# Patient Record
Sex: Male | Born: 1970 | Race: White | Hispanic: No | Marital: Married | Smoking: Never smoker
Health system: Southern US, Community
[De-identification: ages and names within clinical notes are randomized; demographics above are authoritative.]

## PROBLEM LIST (undated history)

## (undated) DIAGNOSIS — E119 Type 2 diabetes mellitus without complications: Secondary | ICD-10-CM

## (undated) DIAGNOSIS — E538 Deficiency of other specified B group vitamins: Secondary | ICD-10-CM

## (undated) DIAGNOSIS — G4733 Obstructive sleep apnea (adult) (pediatric): Secondary | ICD-10-CM

## (undated) DIAGNOSIS — R600 Localized edema: Secondary | ICD-10-CM

## (undated) DIAGNOSIS — J45909 Unspecified asthma, uncomplicated: Secondary | ICD-10-CM

## (undated) DIAGNOSIS — E232 Diabetes insipidus: Secondary | ICD-10-CM

## (undated) DIAGNOSIS — R29898 Other symptoms and signs involving the musculoskeletal system: Secondary | ICD-10-CM

## (undated) DIAGNOSIS — G629 Polyneuropathy, unspecified: Secondary | ICD-10-CM

## (undated) DIAGNOSIS — T8859XA Other complications of anesthesia, initial encounter: Secondary | ICD-10-CM

## (undated) DIAGNOSIS — M549 Dorsalgia, unspecified: Secondary | ICD-10-CM

## (undated) DIAGNOSIS — E669 Obesity, unspecified: Secondary | ICD-10-CM

## (undated) DIAGNOSIS — N469 Male infertility, unspecified: Secondary | ICD-10-CM

## (undated) DIAGNOSIS — I82409 Acute embolism and thrombosis of unspecified deep veins of unspecified lower extremity: Secondary | ICD-10-CM

## (undated) HISTORY — PX: CERVICAL DISC SURGERY: SHX588

## (undated) HISTORY — PX: ULNAR NERVE REPAIR: SHX2594

## (undated) HISTORY — DX: Obesity, unspecified: E66.9

## (undated) HISTORY — DX: Localized edema: R60.0

## (undated) HISTORY — DX: Obstructive sleep apnea (adult) (pediatric): G47.33

## (undated) HISTORY — PX: FIBULA FRACTURE SURGERY: SHX947

## (undated) HISTORY — PX: OTHER SURGICAL HISTORY: SHX169

## (undated) HISTORY — PX: ELBOW SURGERY: SHX618

## (undated) HISTORY — DX: Acute embolism and thrombosis of unspecified deep veins of unspecified lower extremity: I82.409

## (undated) HISTORY — DX: Other symptoms and signs involving the musculoskeletal system: R29.898

## (undated) HISTORY — PX: BACK SURGERY: SHX140

## (undated) HISTORY — DX: Male infertility, unspecified: N46.9

## (undated) HISTORY — DX: Deficiency of other specified B group vitamins: E53.8

## (undated) HISTORY — DX: Diabetes insipidus: E23.2

## (undated) HISTORY — DX: Type 2 diabetes mellitus without complications: E11.9

## (undated) HISTORY — DX: Dorsalgia, unspecified: M54.9

---

## 2003-07-21 ENCOUNTER — Emergency Department (HOSPITAL_COMMUNITY): Admission: EM | Admit: 2003-07-21 | Discharge: 2003-07-22 | Payer: Self-pay | Admitting: Emergency Medicine

## 2003-07-30 ENCOUNTER — Ambulatory Visit (HOSPITAL_COMMUNITY): Admission: RE | Admit: 2003-07-30 | Discharge: 2003-07-30 | Payer: Self-pay | Admitting: Family Medicine

## 2004-03-31 ENCOUNTER — Encounter: Payer: Self-pay | Admitting: Pulmonary Disease

## 2004-04-01 ENCOUNTER — Encounter: Payer: Self-pay | Admitting: Pulmonary Disease

## 2004-04-08 ENCOUNTER — Encounter: Payer: Self-pay | Admitting: Pulmonary Disease

## 2004-07-24 ENCOUNTER — Ambulatory Visit: Payer: Self-pay | Admitting: Family Medicine

## 2004-10-18 ENCOUNTER — Ambulatory Visit: Payer: Self-pay | Admitting: Family Medicine

## 2006-11-27 ENCOUNTER — Ambulatory Visit: Payer: Self-pay | Admitting: Family Medicine

## 2007-01-13 DIAGNOSIS — M5020 Other cervical disc displacement, unspecified cervical region: Secondary | ICD-10-CM | POA: Insufficient documentation

## 2007-01-15 ENCOUNTER — Ambulatory Visit: Payer: Self-pay | Admitting: Family Medicine

## 2007-01-15 DIAGNOSIS — Z86718 Personal history of other venous thrombosis and embolism: Secondary | ICD-10-CM | POA: Insufficient documentation

## 2007-01-17 ENCOUNTER — Telehealth: Payer: Self-pay | Admitting: Family Medicine

## 2007-01-17 ENCOUNTER — Ambulatory Visit: Payer: Self-pay | Admitting: Family Medicine

## 2007-01-17 ENCOUNTER — Encounter: Admission: RE | Admit: 2007-01-17 | Discharge: 2007-01-17 | Payer: Self-pay | Admitting: Family Medicine

## 2007-01-21 ENCOUNTER — Encounter: Admission: RE | Admit: 2007-01-21 | Discharge: 2007-01-21 | Payer: Self-pay | Admitting: Family Medicine

## 2007-01-22 ENCOUNTER — Telehealth (INDEPENDENT_AMBULATORY_CARE_PROVIDER_SITE_OTHER): Payer: Self-pay | Admitting: *Deleted

## 2007-01-29 ENCOUNTER — Ambulatory Visit (HOSPITAL_COMMUNITY): Admission: RE | Admit: 2007-01-29 | Discharge: 2007-01-30 | Payer: Self-pay | Admitting: Neurosurgery

## 2007-02-11 ENCOUNTER — Ambulatory Visit: Payer: Self-pay | Admitting: Family Medicine

## 2007-02-11 LAB — CONVERTED CEMR LAB
ALT: 30 units/L (ref 0–53)
Albumin: 3.5 g/dL (ref 3.5–5.2)
Alkaline Phosphatase: 44 units/L (ref 39–117)
Bilirubin Urine: NEGATIVE
Bilirubin, Direct: 0.1 mg/dL (ref 0.0–0.3)
Calcium: 9.7 mg/dL (ref 8.4–10.5)
Chloride: 103 meq/L (ref 96–112)
Direct LDL: 93.7 mg/dL
Eosinophils Absolute: 0 10*3/uL (ref 0.0–0.6)
Eosinophils Relative: 0.2 % (ref 0.0–5.0)
Glucose, Urine, Semiquant: NEGATIVE
HDL: 24.7 mg/dL — ABNORMAL LOW (ref >39.0)
Ketones, urine, test strip: NEGATIVE
Lymphocytes Relative: 49.6 % — ABNORMAL HIGH (ref 12.0–46.0)
MCHC: 35.8 g/dL (ref 30.0–36.0)
Monocytes Relative: 7.9 % (ref 3.0–11.0)
Neutrophils Relative %: 41.7 % — ABNORMAL LOW (ref 43.0–77.0)
RBC: 4.28 M/uL (ref 4.22–5.81)
Sodium: 141 meq/L (ref 135–145)
TSH: 0.89 microintl units/mL (ref 0.35–5.50)
Total Bilirubin: 0.5 mg/dL (ref 0.3–1.2)
Total Protein: 6.3 g/dL (ref 6.0–8.3)
Triglycerides: 246 mg/dL (ref 0–149)
Urobilinogen, UA: 0.2
WBC: 7.9 10*3/uL (ref 4.5–10.5)

## 2007-02-13 ENCOUNTER — Ambulatory Visit: Payer: Self-pay | Admitting: Family Medicine

## 2007-02-13 DIAGNOSIS — Z6838 Body mass index (BMI) 38.0-38.9, adult: Secondary | ICD-10-CM | POA: Insufficient documentation

## 2007-02-13 DIAGNOSIS — G4733 Obstructive sleep apnea (adult) (pediatric): Secondary | ICD-10-CM | POA: Insufficient documentation

## 2007-05-19 ENCOUNTER — Ambulatory Visit: Payer: Self-pay | Admitting: Internal Medicine

## 2007-05-19 LAB — CONVERTED CEMR LAB
Glucose, Bld: 138 mg/dL — ABNORMAL HIGH (ref 70–99)
Hgb A1c MFr Bld: 7.3 % — ABNORMAL HIGH (ref 4.6–6.0)

## 2007-05-26 ENCOUNTER — Telehealth: Payer: Self-pay | Admitting: Internal Medicine

## 2007-05-26 ENCOUNTER — Ambulatory Visit: Payer: Self-pay | Admitting: Internal Medicine

## 2007-05-26 ENCOUNTER — Ambulatory Visit: Payer: Self-pay

## 2007-05-26 DIAGNOSIS — R21 Rash and other nonspecific skin eruption: Secondary | ICD-10-CM

## 2007-05-26 DIAGNOSIS — R609 Edema, unspecified: Secondary | ICD-10-CM | POA: Insufficient documentation

## 2007-06-23 ENCOUNTER — Ambulatory Visit: Payer: Self-pay | Admitting: Internal Medicine

## 2007-06-23 DIAGNOSIS — M722 Plantar fascial fibromatosis: Secondary | ICD-10-CM

## 2007-08-18 ENCOUNTER — Encounter: Payer: Self-pay | Admitting: Internal Medicine

## 2007-08-18 ENCOUNTER — Encounter: Admission: RE | Admit: 2007-08-18 | Discharge: 2007-08-26 | Payer: Self-pay | Admitting: Internal Medicine

## 2007-08-21 ENCOUNTER — Telehealth (INDEPENDENT_AMBULATORY_CARE_PROVIDER_SITE_OTHER): Payer: Self-pay | Admitting: *Deleted

## 2007-09-19 ENCOUNTER — Ambulatory Visit: Payer: Self-pay | Admitting: Internal Medicine

## 2007-09-19 DIAGNOSIS — J45901 Unspecified asthma with (acute) exacerbation: Secondary | ICD-10-CM

## 2007-09-19 DIAGNOSIS — J45909 Unspecified asthma, uncomplicated: Secondary | ICD-10-CM | POA: Insufficient documentation

## 2007-09-19 DIAGNOSIS — J4520 Mild intermittent asthma, uncomplicated: Secondary | ICD-10-CM | POA: Insufficient documentation

## 2007-09-26 ENCOUNTER — Ambulatory Visit: Payer: Self-pay | Admitting: Internal Medicine

## 2007-09-26 LAB — CONVERTED CEMR LAB: Hgb A1c MFr Bld: 6.3 % — ABNORMAL HIGH (ref 4.6–6.0)

## 2007-10-03 ENCOUNTER — Ambulatory Visit: Payer: Self-pay | Admitting: Internal Medicine

## 2007-10-03 DIAGNOSIS — I8390 Asymptomatic varicose veins of unspecified lower extremity: Secondary | ICD-10-CM

## 2007-10-03 LAB — CONVERTED CEMR LAB: Blood Glucose, Fingerstick: 120

## 2007-10-23 ENCOUNTER — Ambulatory Visit: Payer: Self-pay | Admitting: Pulmonary Disease

## 2007-11-28 ENCOUNTER — Encounter: Payer: Self-pay | Admitting: Pulmonary Disease

## 2007-12-09 ENCOUNTER — Ambulatory Visit: Payer: Self-pay | Admitting: Vascular Surgery

## 2007-12-13 DIAGNOSIS — E139 Other specified diabetes mellitus without complications: Secondary | ICD-10-CM | POA: Insufficient documentation

## 2007-12-13 HISTORY — DX: Other specified diabetes mellitus without complications: E13.9

## 2007-12-22 ENCOUNTER — Ambulatory Visit: Payer: Self-pay | Admitting: Pulmonary Disease

## 2007-12-22 DIAGNOSIS — G4733 Obstructive sleep apnea (adult) (pediatric): Secondary | ICD-10-CM

## 2008-01-11 LAB — HM DIABETES EYE EXAM: HM Diabetic Eye Exam: NORMAL

## 2008-01-16 ENCOUNTER — Ambulatory Visit: Payer: Self-pay | Admitting: Internal Medicine

## 2008-01-16 LAB — CONVERTED CEMR LAB
Albumin: 3.7 g/dL (ref 3.5–5.2)
Alkaline Phosphatase: 44 units/L (ref 39–117)
Cholesterol: 160 mg/dL (ref 0–200)
Hgb A1c MFr Bld: 6.7 % — ABNORMAL HIGH (ref 4.6–6.0)
Total Protein: 6.8 g/dL (ref 6.0–8.3)
Triglycerides: 117 mg/dL (ref 0–149)
VLDL: 23 mg/dL (ref 0–40)

## 2008-01-23 ENCOUNTER — Ambulatory Visit: Payer: Self-pay | Admitting: Internal Medicine

## 2008-01-23 DIAGNOSIS — E785 Hyperlipidemia, unspecified: Secondary | ICD-10-CM | POA: Insufficient documentation

## 2008-02-02 ENCOUNTER — Telehealth: Payer: Self-pay | Admitting: *Deleted

## 2008-04-07 ENCOUNTER — Ambulatory Visit: Payer: Self-pay | Admitting: Internal Medicine

## 2008-04-07 LAB — CONVERTED CEMR LAB
AST: 17 units/L (ref 0–37)
Albumin: 3.7 g/dL (ref 3.5–5.2)
Cholesterol: 166 mg/dL (ref 0–200)
HDL: 23.3 mg/dL — ABNORMAL LOW (ref >39.0)
LDL Cholesterol: 106 mg/dL — ABNORMAL HIGH (ref 0–99)
Triglycerides: 184 mg/dL — ABNORMAL HIGH (ref 0–149)

## 2008-04-14 ENCOUNTER — Ambulatory Visit: Payer: Self-pay | Admitting: Internal Medicine

## 2008-04-14 LAB — CONVERTED CEMR LAB: Blood Glucose, Fingerstick: 154

## 2008-04-19 ENCOUNTER — Emergency Department (HOSPITAL_COMMUNITY): Admission: EM | Admit: 2008-04-19 | Discharge: 2008-04-19 | Payer: Self-pay | Admitting: Emergency Medicine

## 2008-04-30 ENCOUNTER — Encounter: Admission: RE | Admit: 2008-04-30 | Discharge: 2008-04-30 | Payer: Self-pay | Admitting: Neurosurgery

## 2008-06-21 ENCOUNTER — Ambulatory Visit: Payer: Self-pay | Admitting: Pulmonary Disease

## 2008-07-16 ENCOUNTER — Ambulatory Visit: Payer: Self-pay | Admitting: Internal Medicine

## 2008-07-16 LAB — CONVERTED CEMR LAB
ALT: 32 units/L (ref 0–53)
Albumin: 3.7 g/dL (ref 3.5–5.2)
Cholesterol: 171 mg/dL (ref 0–200)
Creatinine,U: 141.2 mg/dL
Hgb A1c MFr Bld: 7.3 % — ABNORMAL HIGH (ref 4.6–6.5)
Microalb Creat Ratio: 9.9 mg/g (ref 0.0–30.0)
Total CHOL/HDL Ratio: 7
VLDL: 50 mg/dL — ABNORMAL HIGH (ref 0.0–40.0)

## 2008-07-23 ENCOUNTER — Ambulatory Visit: Payer: Self-pay | Admitting: Internal Medicine

## 2008-07-23 LAB — CONVERTED CEMR LAB: Blood Glucose, Fingerstick: 128

## 2008-10-08 ENCOUNTER — Ambulatory Visit: Payer: Self-pay | Admitting: Internal Medicine

## 2008-10-08 LAB — CONVERTED CEMR LAB
ALT: 25 units/L (ref 0–53)
AST: 18 units/L (ref 0–37)
Alkaline Phosphatase: 49 units/L (ref 39–117)
Bilirubin, Direct: 0.1 mg/dL (ref 0.0–0.3)
Cholesterol: 156 mg/dL (ref 0–200)
HDL: 30.4 mg/dL — ABNORMAL LOW (ref >39.00)
Hgb A1c MFr Bld: 8 % — ABNORMAL HIGH (ref 4.6–6.5)
Total Protein: 7.3 g/dL (ref 6.0–8.3)

## 2008-10-15 ENCOUNTER — Ambulatory Visit: Payer: Self-pay | Admitting: Internal Medicine

## 2008-10-15 LAB — CONVERTED CEMR LAB: Blood Glucose, Fingerstick: 350

## 2008-11-25 ENCOUNTER — Telehealth: Payer: Self-pay | Admitting: *Deleted

## 2008-12-09 ENCOUNTER — Encounter: Payer: Self-pay | Admitting: Pulmonary Disease

## 2009-01-06 ENCOUNTER — Ambulatory Visit: Payer: Self-pay | Admitting: Internal Medicine

## 2009-01-14 ENCOUNTER — Ambulatory Visit: Payer: Self-pay | Admitting: Internal Medicine

## 2009-01-15 ENCOUNTER — Encounter: Payer: Self-pay | Admitting: Pulmonary Disease

## 2009-01-21 ENCOUNTER — Ambulatory Visit: Payer: Self-pay | Admitting: Internal Medicine

## 2009-01-21 DIAGNOSIS — R3915 Urgency of urination: Secondary | ICD-10-CM | POA: Insufficient documentation

## 2009-01-21 LAB — CONVERTED CEMR LAB
Bilirubin Urine: NEGATIVE
Nitrite: NEGATIVE
Protein, U semiquant: NEGATIVE
WBC Urine, dipstick: NEGATIVE

## 2009-04-29 ENCOUNTER — Ambulatory Visit: Payer: Self-pay | Admitting: Internal Medicine

## 2009-04-29 LAB — CONVERTED CEMR LAB
ALT: 29 units/L (ref 0–53)
AST: 21 units/L (ref 0–37)
Cholesterol: 128 mg/dL (ref 0–200)
Hgb A1c MFr Bld: 9 % — ABNORMAL HIGH (ref 4.6–6.5)
Total Bilirubin: 0.5 mg/dL (ref 0.3–1.2)
Total CHOL/HDL Ratio: 3
Total Protein: 7 g/dL (ref 6.0–8.3)

## 2009-05-06 ENCOUNTER — Telehealth: Payer: Self-pay | Admitting: *Deleted

## 2009-05-06 ENCOUNTER — Ambulatory Visit: Payer: Self-pay | Admitting: Internal Medicine

## 2009-05-06 LAB — CONVERTED CEMR LAB
Blood Glucose, Fingerstick: 258
Glucose, Urine, Semiquant: 100
Urobilinogen, UA: 0.2
pH: 5

## 2009-05-09 ENCOUNTER — Encounter (INDEPENDENT_AMBULATORY_CARE_PROVIDER_SITE_OTHER): Payer: Self-pay | Admitting: *Deleted

## 2009-05-09 ENCOUNTER — Ambulatory Visit: Payer: Self-pay | Admitting: Internal Medicine

## 2009-05-18 LAB — CONVERTED CEMR LAB
Leukocytes, UA: NEGATIVE
Nitrite: NEGATIVE
Urine Glucose: NEGATIVE mg/dL
Urobilinogen, UA: 0.2 (ref 0.0–1.0)

## 2009-05-20 ENCOUNTER — Encounter: Payer: Self-pay | Admitting: Internal Medicine

## 2009-05-25 ENCOUNTER — Telehealth: Payer: Self-pay | Admitting: *Deleted

## 2009-05-31 ENCOUNTER — Ambulatory Visit: Payer: Self-pay | Admitting: Gastroenterology

## 2009-05-31 LAB — CONVERTED CEMR LAB: TSH: 1.5 microintl units/mL (ref 0.35–5.50)

## 2009-06-03 ENCOUNTER — Encounter: Payer: Self-pay | Admitting: Gastroenterology

## 2009-06-03 ENCOUNTER — Ambulatory Visit: Payer: Self-pay | Admitting: Internal Medicine

## 2009-06-03 LAB — CONVERTED CEMR LAB: Tissue Transglutaminase Ab, IgA: 0.5 units (ref <7)

## 2009-06-04 ENCOUNTER — Encounter: Payer: Self-pay | Admitting: Gastroenterology

## 2009-06-07 ENCOUNTER — Encounter: Payer: Self-pay | Admitting: Internal Medicine

## 2009-06-08 ENCOUNTER — Encounter: Payer: Self-pay | Admitting: Internal Medicine

## 2009-07-12 ENCOUNTER — Ambulatory Visit: Payer: Self-pay | Admitting: Gastroenterology

## 2009-07-14 ENCOUNTER — Encounter: Payer: Self-pay | Admitting: Gastroenterology

## 2009-07-29 ENCOUNTER — Ambulatory Visit: Payer: Self-pay | Admitting: Internal Medicine

## 2009-07-29 LAB — CONVERTED CEMR LAB
ALT: 24 units/L (ref 0–53)
Alkaline Phosphatase: 47 units/L (ref 39–117)
Basophils Absolute: 0 10*3/uL (ref 0.0–0.1)
Basophils Relative: 0.4 % (ref 0.0–3.0)
Bilirubin, Direct: 0.1 mg/dL (ref 0.0–0.3)
Cholesterol: 167 mg/dL (ref 0–200)
Creatinine, Ser: 0.7 mg/dL (ref 0.4–1.5)
Eosinophils Relative: 0 % (ref 0.0–5.0)
GFR calc non Af Amer: 145.37 mL/min (ref >60)
Glucose, Bld: 138 mg/dL — ABNORMAL HIGH (ref 70–99)
HDL: 30 mg/dL — ABNORMAL LOW (ref >39.00)
Monocytes Relative: 6 % (ref 3.0–12.0)
Neutro Abs: 3.7 10*3/uL (ref 1.4–7.7)
Nitrite: NEGATIVE
Protein, U semiquant: NEGATIVE
RBC: 4.43 M/uL (ref 4.22–5.81)
RDW: 13.5 % (ref 11.5–14.6)
Specific Gravity, Urine: >=1.03
TSH: 0.74 microintl units/mL (ref 0.35–5.50)
Total Bilirubin: 0.8 mg/dL (ref 0.3–1.2)
Total CHOL/HDL Ratio: 6
Triglycerides: 151 mg/dL — ABNORMAL HIGH (ref 0.0–149.0)
WBC: 7.7 10*3/uL (ref 4.5–10.5)

## 2009-08-05 ENCOUNTER — Ambulatory Visit: Payer: Self-pay | Admitting: Internal Medicine

## 2009-08-05 DIAGNOSIS — D126 Benign neoplasm of colon, unspecified: Secondary | ICD-10-CM | POA: Insufficient documentation

## 2009-08-15 ENCOUNTER — Encounter: Payer: Self-pay | Admitting: Pulmonary Disease

## 2009-11-25 ENCOUNTER — Ambulatory Visit: Payer: Self-pay | Admitting: Internal Medicine

## 2009-11-25 LAB — CONVERTED CEMR LAB
Direct LDL: 97.1 mg/dL
Hgb A1c MFr Bld: 9.1 % — ABNORMAL HIGH (ref 4.6–6.5)
VLDL: 66.2 mg/dL — ABNORMAL HIGH (ref 0.0–40.0)

## 2009-12-09 ENCOUNTER — Ambulatory Visit: Payer: Self-pay | Admitting: Internal Medicine

## 2009-12-09 DIAGNOSIS — Z9119 Patient's noncompliance with other medical treatment and regimen: Secondary | ICD-10-CM | POA: Insufficient documentation

## 2009-12-09 LAB — CONVERTED CEMR LAB: Blood Glucose, Fingerstick: 204

## 2010-04-09 LAB — CONVERTED CEMR LAB
Blood Glucose, Fingerstick: 169
Blood Glucose, Fingerstick: 289

## 2010-04-11 NOTE — Letter (Signed)
Summary: Patient Notice- Polyp Results  [facility]  [address] [place], [place] [address]   Phone: [phone]  Fax: [phone]        Jul 14, 2009 MRN: [id number]    Troy [name] [address] [place], [place]  [address]    Dear [name],  I am pleased to inform you that the colon polyp(s) removed during your recent colonoscopy was (were) found to be benign (no cancer detected) upon pathologic examination.  I recommend you have a repeat colonoscopy examination in 5 years to look for recurrent polyps, as having colon polyps increases your risk for having recurrent polyps or even colon cancer in the future.  Should you develop new or worsening symptoms of abdominal pain, bowel habit changes or bleeding from the rectum or bowels, please schedule an evaluation with either your primary care physician or with me.  Continue treatment plan as outlined the day of your exam.  Please call us if you are having persistent problems or have questions about your condition that have not been fully answered at this time.  Sincerely,  Meryl Dare MD [facility]  This letter has been electronically signed by your physician.  Appended Document: Patient Notice- Polyp Results letter mailed 4.9.11.

## 2010-04-11 NOTE — Assessment & Plan Note (Signed)
Summary: cpx//ccm   Vital Signs:  Patient profile:   40 year old male Height:      70.5 inches Weight:      336 pounds BMI:     47.70 Temp:     97.9 degrees F oral Pulse rate:   65 / minute BP sitting:   120 / 80  (right arm) Cuff size:   large  Vitals Entered By: Kathrynn Speed CMA (Aug 05, 2009 11:29 AM) CC: CPX Vickie Epley CBG Result 169   History of Present Illness: Troy [name] comes in today   for preventive visit and follow up of multiple medical problems  Since last visit  here  there have been no major changes in health status  .     Just moved    residence to a house . RESP:    no change   any signs related to obesity DM doing some better  no low.s.  CV :   no cp sob change OSA helps to sleep 5 plus hours   cpap GI : now on pancrease enzume  doing better had colonosoccopy  polyp. on 5 year recall  BP   has been  ok  Lipids: nose of meds  Vision no change   no recent dental appt  Legs  No increase swelling or ulcers     Preventive Screening-Counseling & Management  Alcohol-Tobacco     Alcohol drinks/day: 0     Smoking Status: never  Caffeine-Diet-Exercise     Caffeine use/day: 0     Does Patient Exercise: yes  Hep-HIV-STD-Contraception     Dental Visit-last 6 months no     Sun Exposure-Excessive: no  Safety-Violence-Falls     Seat Belt Use: yes     Firearms in the Home: no firearms in the home     Smoke Detectors: yes  Current Medications (verified): 1)  Aspirin 325 Mg Tabs (Aspirin) .... Take One By Mouth At Bedtime 2)  Proair Hfa 108 (90 Base) Mcg/act  Aers (Albuterol Sulfate) .[name].. 1-2 Puffs Qid As Needed Wheezing 3)  Symbicort 160-4.5 Mcg/act  Aero (Budesonide-Formoterol Fumarate) .... 2 Puffs Two Times A Day As Needed 4)  Metformin Hcl 500 Mg Tabs (Metformin Hcl) .[name].. 1 By Mouth Two Times A Day 5)  Hydrocodone-Acetaminophen 5-325 Mg Tabs (Hydrocodone-Acetaminophen) .... Take By Mouth As Needed 6)  Simvastatin 40 Mg Tabs (Simvastatin) .[name].. 1 By  Mouth Once Daily 7)  Onglyza 5 Mg Tabs (Saxagliptin Hcl) .[name].. 1 By Mouth Once Daily 8)  Humalog Kwikpen 100 Unit/ml Soln (Insulin Lispro (Human)) .... 8-10 Units Before Larges Meal Once A Day  or As Directed 9)  Bd Pen Needle Ultrafine 29g X 12.65mm Misc (Insulin Pen Needle) .... Use As Directed 10)  Actos 30 Mg Tabs (Pioglitazone Hcl) .[name].. 1 By Mouth Once Daily 11)  Freestyle Lite Test  Strp (Glucose Blood) .... Use Two Times A Day 12)  Creon 24000 Unit Cpep (Pancrelipase (Lip-Prot-Amyl)) .... One Tablet By Mouth Before Meals  Allergies (verified): 1)  ! * Picc Line  Past History:  Past medical, surgical, family and social histories (including risk factors) reviewed, and no changes noted (except as noted below).  Past Medical History: DVT, hx of OSA  Diabetes Obesity Residual weakness right hand arm  Consults Dr. Oliver Hum Dr. Hart [place]  Past Surgical History: Reviewed history from 05/31/2009 and no changes required. hx R fibula fx and required surgery and a  plate and pin, removed due to infection 1997 laceration right  ulnar nerve repaired by Dr. Ozella Almond Left elbow surgery  Cubital tunnel left arm 2003 C6/C7 disc surgery 2009  Past History:  Care Management: Neurology: Lovell Sheehan Pulmonary: Clance GI: Urology: Allaince Urology Dr? Gastroenterology: Russella Dar  Family History: Reviewed history from 05/31/2009 and no changes required. Family History Hypertension father had cervical and lumbar disk disease mother has hypertension DVTs, sleep apnea, and obesity.   No brothers no sisters  allergies: father heart disease: mother clotting disorders: mother  father Sudden death  07-Mar-2010No FH of Colon Cancer:  Social History: Reviewed history from 06/03/2009 and no changes required. 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  Momwas sick recently helping out Just moved to 4 r house up and down stairs  father died.  suddenly .   05/21/08 Daily Caffeine Use  one a day Dental Care w/in 6 mos.:  no Sun Exposure-Excessive:  no Seat Belt Use:  yes  Review of Systems  The patient denies anorexia, fever, weight gain, vision loss, decreased hearing, hoarseness, chest pain, syncope, prolonged cough, abdominal pain, melena, hematochezia, severe indigestion/heartburn, hematuria, transient blindness, difficulty walking, depression, unusual weight change, abnormal bleeding, enlarged lymph nodes, and angioedema.         some lbp after lifting boxes and moving    no radiation on pancrease enzyme  med    Diabetes Management Exam:    Foot Exam (with socks and/or shoes not present):       Sensory-Monofilament:          Left foot: normal          Right foot: normal       Sensory-other: some callus no ulcer   or cracking        Inspection:          Left foot: normal          Right foot: normal       Nails:          Left foot: normal          Right foot: normal Physical Exam General Appearance: well developed, well nourished, no acute distress Eyes: conjunctiva and lids normal, PERRLA, EOMI,  WNL Ears, Nose, Mouth, Throat: TM clear, nares clear, oral exam WNL Neck: supple, no lymphadenopathy, no thyromegaly, no JVD Respiratory: clear to auscultation and percussion, respiratory effort normal Cardiovascular: regular rate and rhythm, S1-S2, no murmur, rub or gallop, no bruits, peripheral pulses normal and symmetric, no cyanosis, clubbing,  trc  edema local induration redness medial right leg  wihtout ulceration Chest: no scars, masses, tenderness; no asymmetry, skin changes, nipple discharge, no gynecomastia   Gastrointestinal: soft, non-tender; no hepatosplenomegaly, masses; active bowel sounds all quadrants, see colonscopy report  pre cancerous polyps Lymphatic: no cervical, axillary or inguinal  adenopathy Musculoskeletal: gait normal, muscle tone and strength WNL, no joint swelling, effusions, discoloration, crepitus  Skin: clear, good turgor, color WNL, no rashes, lesions, or ulcerations Neurologic: normal mental status, normal reflexes, normal strength, sensation, and motion Psychiatric: alert; oriented to person, place and time Other Exam:   EKG NSR  no acute changes     Impression & Recommendations:  Problem # 1:  PREVENTIVE HEALTH CARE (ICD-V70.0)  Discussed nutrition,exercise,diet,healthy weight, vitamin D and calcium.    Reviewed preventive care protocols, scheduled due services, and updated  immunizations.  Problem # 2:  DIABETES MELLITUS (ICD-250.00) Assessment: Improved some improved still needs improvemnt  His updated medication list for this problem includes:    Aspirin 325 Mg Tabs (Aspirin) .[name]... Take one by mouth at bedtime    Metformin Hcl 500 Mg Tabs (Metformin hcl) .[name]... 1 by mouth two times a day    Onglyza 5 Mg Tabs (Saxagliptin hcl) .[name]... 1 by mouth once daily    Humalog Kwikpen 100 Unit/ml Soln (Insulin lispro (human)) .[name]... 8-10 units before larges meal once a day  or as directed    Actos 30 Mg Tabs (Pioglitazone hcl) .[name]... 1 by mouth once daily  Labs Reviewed: Creat: 0.7 (07/29/2009)     Last Eye Exam: normal (01/11/2008) Reviewed HgBA1c results: 7.2 (07/29/2009)  9.0 (04/29/2009)  Problem # 3:  HYPERLIPIDEMIA (ICD-272.4) coulod be better    wants to go with lifestyle intervention  His updated medication list for this problem includes:    Simvastatin 40 Mg Tabs (Simvastatin) .[name]... 1 by mouth once daily  Labs Reviewed: SGOT: 18 (07/29/2009)   SGPT: 24 (07/29/2009)   HDL:30.00 (07/29/2009), 37.70 (04/29/2009)  LDL:107 (07/29/2009), 53 (16/12/9602)  Chol:167 (07/29/2009), 128 (04/29/2009)  Trig:151.0 (07/29/2009), 189.0 (04/29/2009)  Problem # 4:  OBSTRUCTIVE SLEEP APNEA (ICD-327.23) Assessment: Unchanged on cpap  Problem # 5:  VARICOSE  VEINS, LOWER EXTREMITIES (ICD-454.9) Assessment: Unchanged stocking on right helps   Problem # 6:  MORBID OBESITY (ICD-278.01) some weight loss  since last visit ...... need  s to continue   Problem # 7:  ASTHMA (ICD-493.90) Assessment: Unchanged  His updated medication list for this problem includes:    Proair Hfa 108 (90 Base) Mcg/act Aers (Albuterol sulfate) .[name]... 1-2 puffs qid as needed wheezing    Symbicort 160-4.5 Mcg/act Aero (Budesonide-formoterol fumarate) .[name]... 2 puffs two times a day as needed  Pulmonary Functions Reviewed: O2 sat: 98 (01/21/2009)  Complete Medication List: 1)  Aspirin 325 Mg Tabs (Aspirin) .... Take one by mouth at bedtime 2)  Proair Hfa 108 (90 Base) Mcg/act Aers (Albuterol sulfate) .[name].. 1-2 puffs qid as needed wheezing 3)  Symbicort 160-4.5 Mcg/act Aero (Budesonide-formoterol fumarate) .... 2 puffs two times a day as needed 4)  Metformin Hcl 500 Mg Tabs (Metformin hcl) .[name].. 1 by mouth two times a day 5)  Hydrocodone-acetaminophen 5-325 Mg Tabs (Hydrocodone-acetaminophen) .... Take by mouth as needed 6)  Simvastatin 40 Mg Tabs (Simvastatin) .[name].. 1 by mouth once daily 7)  Onglyza 5 Mg Tabs (Saxagliptin hcl) .[name].. 1 by mouth once daily 8)  Humalog Kwikpen 100 Unit/ml Soln (Insulin lispro (human)) .... 8-10 units before larges meal once a day  or as directed 9)  Bd Pen Needle Ultrafine 29g X 12.22mm Misc (Insulin pen needle) .... Use as directed 10)  Actos 30 Mg Tabs (Pioglitazone hcl) .[name].. 1 by mouth once daily 11)  Freestyle Lite Test Strp (Glucose blood) .... Use two times a day 12)  Creon 24000 Unit Cpep (Pancrelipase (lip-prot-amyl)) .... One tablet by mouth before meals  Other Orders: EKG w/ Interpretation (93000)  Patient Instructions: 1)  intermittently   stand  when working .   2)  get eye exam and dental check. 3)  Continue  weight loss  to help your medical problems and medical  expenses.    4)  consider welchol  in future   for lipids and  blood sugar.  5)  HgBA1c prior to visit  ICD-9:  6)  Lipid panel prior to visit ICD-9 :  7)  Please schedule a follow-up appointment in3- 4 months .   Preventive Care Screening  Prior Values:    Last Tetanus Booster:  Td (02/13/2007)    Last Flu Shot:  Historical (12/27/2008)   Prevention & Chronic Care Immunizations   Influenza vaccine: Historical  (12/27/2008)   Influenza vaccine due: 02/13/2008    Tetanus booster: 02/13/2007: Td   Tetanus booster due: 02/12/2017    Pneumococcal vaccine: Not documented  Other Screening   Smoking status: never  (08/05/2009)  Diabetes Mellitus   HgbA1C: 7.2  (07/29/2009)   Hemoglobin A1C due: 07/27/2009    Eye exam: normal  (01/11/2008)   Eye exam due: 01/2009    Foot exam: yes  (08/05/2009)   High risk foot: Not documented   Foot care education: Not documented    Urine microalbumin/creatinine ratio: 0.6  (07/29/2009)   Urine microalbumin/cr due: 07/30/2010    Diabetes flowsheet reviewed?: Yes   Progress toward A1C goal: Improved  Lipids   Total Cholesterol: 167  (07/29/2009)   LDL: 107  (07/29/2009)   LDL Direct: 94.7  (10/08/2008)   HDL: 30.00  (07/29/2009)   Triglycerides: 151.0  (07/29/2009)    SGOT (AST): 18  (07/29/2009)   SGPT (ALT): 24  (07/29/2009)   Alkaline phosphatase: 47  (07/29/2009)   Total bilirubin: 0.8  (07/29/2009)  Self-Management Support :    Diabetes self-management support: Not documented    Lipid self-management support: Not documented

## 2010-04-11 NOTE — Assessment & Plan Note (Signed)
Summary: DIARRHEA/YF   History of Present Illness Visit Type: consult Primary GI MD: Elie Goody MD [facility] Primary Provider: Berniece Andreas, MD Requesting Provider: Berniece Andreas, MD Chief Complaint: Paitnet complains of diarrhea since 2005. He complains that he is having 2-4 BM per day. He denies any abdominal pain, no blood in his stool.  History of Present Illness:   This is a 40 year old male with a six-year history of diarrhea. He relates 2-4 soft, not well formed bowel movements per day, and this pattern has been consistent for the past 6 years. He states he had stool tests performed in [place] several years ago that were negative. He has a cousin with a "gluten allergy". He knows of no variation in his diarrhea with milk products, caffeine, or any other food product.   GI Review of Systems      Denies abdominal pain, acid reflux, belching, bloating, chest pain, dysphagia with liquids, dysphagia with solids, heartburn, loss of appetite, nausea, vomiting, vomiting blood, weight loss, and  weight gain.      Reports diarrhea.     Denies anal fissure, black tarry stools, change in bowel habit, constipation, diverticulosis, fecal incontinence, heme positive stool, hemorrhoids, irritable bowel syndrome, jaundice, light color stool, liver problems, rectal bleeding, and  rectal pain.   Current Medications (verified): 1)  Aspirin 325 Mg Tabs (Aspirin) .... Take One By Mouth At Bedtime 2)  Proair Hfa 108 (90 Base) Mcg/act  Aers (Albuterol Sulfate) .[name].. 1-2 Puffs Qid As Needed Wheezing 3)  Symbicort 160-4.5 Mcg/act  Aero (Budesonide-Formoterol Fumarate) .... 2 Puffs Two Times A Day As Needed 4)  Metformin Hcl 500 Mg Tabs (Metformin Hcl) .[name].. 1 By Mouth Two Times A Day 5)  Hydrocodone-Acetaminophen 5-325 Mg Tabs (Hydrocodone-Acetaminophen) .... Take By Mouth As Needed 6)  Simvastatin 40 Mg Tabs (Simvastatin) .[name].. 1 By Mouth Once Daily 7)  Onglyza 5 Mg Tabs (Saxagliptin Hcl) .[name].. 1 By  Mouth Once Daily 8)  Humalog Kwikpen 100 Unit/ml Soln (Insulin Lispro (Human)) .... 8-10 Units Before Larges Meal Once A Day  or As Directed 9)  Bd Pen Needle Ultrafine 29g X 12.87mm Misc (Insulin Pen Needle) .... Use As Directed 10)  Actos 30 Mg Tabs (Pioglitazone Hcl) .[name].. 1 By Mouth Once Daily  Allergies (verified): 1)  ! * Picc Line  Past History:  Past Medical History: DVT, hx of OSA  Diabetes Obesity  Consults Dr. Oliver Hum Dr. Hart [place]  Past Surgical History: hx R fibula fx and required surgery and a  plate and pin, removed due to infection 1997 laceration right ulnar nerve repaired by Dr. Ozella Almond Left elbow surgery  Cubital tunnel left arm 2003 C6/C7 disc surgery 2009  Family History: Family History Hypertension father had cervical and lumbar disk disease mother has hypertension DVTs, sleep apnea, and obesity.   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  Momwas sick recently helping out father died.  suddenly .   05/03/08 Daily Caffeine Use  one a day  Review of Systems       The patient complains of urination changes/pain.         The pertinent  positives and negatives are noted as above and in the HPI. All other ROS were reviewed and were negative.   Vital Signs:  Patient profile:   40 year old male Height:      71 inches Weight:      351.0 pounds BMI:     49.13 Pulse rate:   88 / minute Pulse rhythm:   regular BP sitting:   120 / 78  (left arm) Cuff size:   large  Vitals Entered By: Harlow Mares CMA Duncan Dull) (May 31, 2009 9:52 AM)  Physical Exam  General:  Well developed, well nourished, no acute distress. obese.     Head:  Normocephalic and atraumatic. Eyes:  PERRLA, no icterus. Ears:  Normal auditory acuity. Mouth:  No deformity or lesions, dentition normal. Neck:  Supple; no masses or thyromegaly. Chest Wall:  Symmetrical,  no deformities . Lungs:  Clear throughout to auscultation. Heart:  Regular rate and rhythm; no murmurs, rubs,  or bruits. Abdomen:  Soft, nontender and nondistended. No masses, hepatosplenomegaly or hernias noted. Normal bowel sounds. Rectal:  Normal exam. hemocult negative loose green, brown stool.   Msk:  Symmetrical with no gross deformities. Normal posture. Pulses:  Normal pulses noted. Extremities:  No clubbing, cyanosis, edema or deformities noted. Neurologic:  Alert and  oriented x4;  grossly normal neurologically. Cervical Nodes:  No significant cervical adenopathy. Inguinal Nodes:  No significant inguinal adenopathy. Psych:  Alert and cooperative. Normal mood and affect.  Impression & Recommendations:  Problem # 1:  DIARRHEA (ICD-787.91) Rule out celiac disease, bacterial overgrowth, giardiasis, inflammatory bowel disease, and other disorders. Empiric trial of metronidazole. If celiac antibody tests are elevated schedule upper endoscopy with biopsies. The risks, benefits and alternatives to colonoscopy with possible biopsy and possible polypectomy were discussed with the patient and they consent to proceed. The procedure will be scheduled electively. Orders: TLB-IgA (Immunoglobulin A) (82784-IGA) TLB-Lipase (83690-LIPASE) TLB-TSH (Thyroid Stimulating Hormone) (84443-TSH) T-Sprue Panel (Celiac Disease Aby Eval) (83516x3/[address]) T-Culture, Stool (87045/[id number]) T-Culture, C-Diff Toxin A/B (16109-60454) T-Stool Fats [place] Stain [id number]) T-Stool Giardia / Crypto- EIA (29562) T-Stool for O&P (13086-57846) T-Fecal WBC (96295-28413) T- * Misc. Laboratory test [phone]) Colonoscopy (Colon)  Patient Instructions: 1)  Get labs drawn today in the  basement. 2)  Pick up your prescription from your pharmacy.  3)  Colonoscopy brochure given.  4)  Copy sent to : Berniece Andreas, MD 5)  The medication list was reviewed and reconciled.  All changed / newly prescribed medications were explained.  A complete medication list was provided to the patient / caregiver. 6)  The medication list was reviewed and reconciled.  All changed / newly prescribed medications were explained.  A complete medication list was provided to the patient / caregiver.  Prescriptions: METRONIDAZOLE 500 MG TABS (METRONIDAZOLE) one tablet by mouth two times a day  #14 x 0   Entered by:   Christie Nottingham CMA (AAMA)   Authorized by:   Meryl Dare MD [facility]   Signed by:   Christie Nottingham CMA (AAMA) on 05/31/2009   Method used:   Electronically to        [facility] [phone]* (retail)       [address]       [place], [place]  [address]       Ph: [phone] or [phone]       Fax: [phone]   RxID:   [id number] MOVIPREP 100 GM  SOLR (PEG-KCL-NACL-NASULF-NA ASC-C) As per prep instructions.  #1  x 0   Entered by:   Christie Nottingham CMA (AAMA)   Authorized by:   Meryl Dare MD [facility]   Signed by:   Christie Nottingham CMA (AAMA) on 05/31/2009   Method used:   Electronically to        [facility] [phone]* (retail)       [address]       [place], [place]  [address]       Ph: [phone] or [phone]       Fax: [phone]   RxID:   [id number]

## 2010-04-11 NOTE — Assessment & Plan Note (Signed)
Summary: 3 MONTH ROV/NJR   Vital Signs:  Patient profile:   40 year old male Height:      71 inches Weight:      350 pounds BMI:     48.99 Pulse rate:   89 / minute BP sitting:   120 / 80  (right arm) Cuff size:   large  Vitals Entered By: Romualdo Bolk, CMA (AAMA) (May 06, 2009 10:11 AM) CC: Follow-up visit on labs- Pt is having more frequent and urgent urination it has gotten worse in the past 3 months. CBG Result 258 Comments fasting   History of Present Illness: Troy [name] comesin today for    follow up of multiple medical problems . DM: says its 150 - 120    hasnt been eating regulary well.  NO lows . No infections.  Urinary urgency worse over the last few weeks.  No hematuria. Diarrhea post prand urgency . NO fever blood and some worse. doesnt think its the metformin Pulm: no change and no wheezing BP good control  not on meds  Obesity  backsliding    works [employer] and stress over loosing job with shut down.      Preventive Screening-Counseling & Management  Alcohol-Tobacco     Alcohol drinks/day: 0     Smoking Status: never  Caffeine-Diet-Exercise     Caffeine use/day: 0     Does Patient Exercise: yes  Current Medications (verified): 1)  Asa 325 .... 2 Am 1 At Bedtime 2)  Proair Hfa 108 (90 Base) Mcg/act  Aers (Albuterol Sulfate) .[name].. 1-2 Puffs Qid As Needed Wheezing 3)  Symbicort 160-4.5 Mcg/act  Aero (Budesonide-Formoterol Fumarate) .... 2 Puffs Two Times A Day As Needed 4)  Metformin Hcl 500 Mg Tabs (Metformin Hcl) .[name].. 1 By Mouth Two Times A Day 5)  Hydrocodone-Acetaminophen 5-325 Mg Tabs (Hydrocodone-Acetaminophen) .... Take By Mouth As Needed 6)  Simvastatin 40 Mg Tabs (Simvastatin) .[name].. 1 By Mouth Once Daily 7)  Onglyza 5 Mg Tabs (Saxagliptin Hcl) .[name].. 1 By Mouth Once Daily  Allergies (verified): 1)  ! * Picc Line  Past History:  Past medical, surgical, family and social histories (including risk factors) reviewed, and no  changes noted (except as noted below).  Past Medical History: DVT, hx of laceration right ulnar nerve repaired by Dr. Ozella Almond, left elbow surgery right hand surgery    Hyperglycemia   DM OSA    Consults Dr. Oliver Hum Dr. Hart [place]  Past Surgical History: Reviewed history from 04/14/2008 and no changes required. hx fibula fx and required surgery and a  plate and pin    taken out because of infection 1997 surgery large laceration r arm    Past History:  Care Management: Neurology: Lovell Sheehan Pulmonary: [facility]  Family History: Reviewed history from 07/23/2008 and no changes required. Family History Hypertension father had cervical and lumbar disk disease mother has hypertension DVTs, sleep apnea, and obesity.   No brothers no sisters    allergies: father heart disease: mother clotting disorders: mother  father Sudden death  05-22-08 Social History: Reviewed history from 01/21/2009 and no changes required. Occupation: customer service  [employer]   to be laid off ? when Married 10 10 10    wife s/p bariatric surgery  patient Never Smoked  Alcohol use-no Drug use-no   Regular exercise-yes pt does have children.  Momwas sick recently helping out  father died.  suddenly .   05/26/08     Review of Systems  The patient denies anorexia, fever, weight loss, vision loss, chest pain, syncope, headaches, abdominal pain, melena, hematochezia, severe indigestion/heartburn, abnormal bleeding, and enlarged lymph nodes.         diarrhea   for years   doesnt really think its the metformin Coughs after eating and phlegm for a while  no HB   Physical Exam  General:  Well-developed,well-nourished,in no acute distress; alert,appropriate and cooperative throughout examination Head:  normocephalic and atraumatic.   Eyes:  vision grossly intact, pupils equal, and pupils round.   Ears:  R ear normal and L ear normal.   Neck:  No deformities, masses, or tenderness noted. Lungs:  Normal  respiratory effort, chest expands symmetrically. Lungs are clear to auscultation, no crackles or wheezes. Heart:  Normal rate and regular rhythm. S1 and S2 normal without gallop, murmur, click, rub or other extra sounds. Pulses:  pulses intact without delay   Neurologic:  alert & oriented X3.   Cervical Nodes:  No lymphadenopathy noted Psych:  Oriented X3, good eye contact, not anxious appearing, and not depressed appearing.    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 1 by mouth two times a day    Onglyza 5 Mg Tabs (Saxagliptin hcl) .[name]... 1 by mouth once daily    Humalog Kwikpen 100 Unit/ml Soln (Insulin lispro (human)) .Marland KitchenMarland KitchenMarland KitchenMarland Kitchen 6 units before larges meal once a day  or as directed  Orders: Gastroenterology Referral (GI)  Problem # 2:  URINARY URGENCY (ICD-788.63) prob from dm plus poss other issues  no infection today   but some blood  will have to repeat this also if   persistent and progressive  could see urology.  Problem # 3:  HYPERLIPIDEMIA (ICD-272.4)  His updated medication list for this problem includes:    Simvastatin 40 Mg Tabs (Simvastatin) .[name]... 1 by mouth once daily  Labs Reviewed: SGOT: 21 (04/29/2009)   SGPT: 29 (04/29/2009)   HDL:37.70 (04/29/2009), 30.40 (10/08/2008)  LDL:53 (04/29/2009), 106 (16/12/9602)   Chol:128 (04/29/2009), 156 (10/08/2008)  Trig:189.0 (04/29/2009), 228.0 (10/08/2008)  Problem # 4:  MORBID OBESITY (ICD-278.01) Assessment: Comment Only  Problem # 5:  ASTHMA (ICD-493.90) Assessment: Unchanged has cough after eating could be reflux    plan to lose weight His updated medication list for this problem includes:    Proair Hfa 108 (90 Base) Mcg/act Aers (Albuterol sulfate) .[name]... 1-2 puffs qid as needed wheezing    Symbicort 160-4.5  Mcg/act Aero (Budesonide-formoterol fumarate) .[name]... 2 puffs two times a day as needed  Problem # 6:  DIARRHEA (ICD-787.91)  poss motility issue or overgrowth  this predated the metformin  .   but ? worse  or interfereing.     rec Gi consult opnion about this  Orders: Gastroenterology Referral (GI)  Complete Medication List: 1)  Asa 325  .... 2 am 1 at bedtime 2)  Proair Hfa 108 (90 Base) Mcg/act Aers (Albuterol sulfate) .[name].. 1-2 puffs qid as needed wheezing 3)  Symbicort 160-4.5 Mcg/act Aero (Budesonide-formoterol fumarate) .... 2 puffs two times a day as needed 4)  Metformin Hcl 500 Mg Tabs (Metformin hcl) .[name].. 1 by mouth two times a day 5)  Hydrocodone-acetaminophen 5-325 Mg Tabs (Hydrocodone-acetaminophen) .... Take by mouth as needed 6)  Simvastatin 40 Mg Tabs (Simvastatin) .[name].. 1 by mouth once daily 7)  Onglyza 5 Mg Tabs (Saxagliptin hcl) .[name].. 1 by mouth once daily 8)  Humalog Kwikpen 100 Unit/ml Soln (Insulin lispro (human)) .... 6 units before larges meal once a day  or as directed 9)  Bd Pen Needle Ultrafine 29g X 12.30mm Misc (Insulin pen needle) .... Use as directed  Other Orders: Capillary Blood Glucose/CBG (30865)  Patient Instructions: 1)  add   humalog    6 units before largest meal  2)  monitor bgs 2-3 x perday for 2 weeks  3)  call these in nd then plan follow up . 4)  Will do Gi consult about your diarrhea. 5)  Change diet   your DM is out of control. 6)  Urine shows glucose and some blood but no infection.  7)  repeat UA   with micro at [facility].   dx abnormal urine.  8)  return office visit in  1-2 months . Prescriptions: SIMVASTATIN 40 MG TABS (SIMVASTATIN) 1 by mouth once daily  #90 x 3   Entered by:   Romualdo Bolk, CMA (AAMA)   Authorized by:   Madelin Headings MD   Signed by:   Romualdo Bolk, CMA (AAMA) on 05/06/2009   Method used:   Electronically to        [facility]* (mail-order)             ,          Ph: [phone]       Fax: [phone]   RxID:   [id number] ONGLYZA 5 MG TABS (SAXAGLIPTIN HCL) 1 by mouth once daily  #90 x 1   Entered by:   Romualdo Bolk, CMA (AAMA)   Authorized by:   Madelin Headings MD   Signed by:   Romualdo Bolk, CMA (AAMA) on 05/06/2009   Method used:   Electronically to        [facility]* (mail-order)             ,          Ph: [phone]       Fax: [phone]   RxID:   [id number] HUMALOG KWIKPEN 100 UNIT/ML SOLN (INSULIN LISPRO (HUMAN)) 6 units before larges meal once a day  or as directed  #1 x 6   Entered and Authorized by:   Madelin Headings MD   Signed by:   Madelin Headings MD on 05/06/2009   Method used:   Samples Given   RxID:   [id number]   Laboratory Results   Urine Tests    Routine Urinalysis   Color:  yellow Appearance: Clear Glucose: 100   (Normal Range: Negative) Bilirubin: small   (Normal Range: Negative) Ketone: negative   (Normal Range: Negative) Spec. Gravity: >=1.030   (Normal Range: 1.003-1.035) Blood: moderate   (Normal Range: Negative) pH: 5.0   (Normal Range: 5.0-8.0) Protein: trace   (Normal Range: Negative) Urobilinogen: 0.2   (Normal Range: 0-1) Nitrite: negative   (Normal Range: Negative) Leukocyte Esterace: negative   (Normal Range: Negative)    Comments: Romualdo Bolk, CMA (AAMA)  May 06, 2009 10:42 AM   Blood Tests     CBG Random:: 258mg /dL

## 2010-04-11 NOTE — Progress Notes (Signed)
Summary: List of Blood Sugars taken at home  List of Blood Sugars taken at home   Imported By: Maryln Gottron 05/26/2009 13:22:28  _____________________________________________________________________  External Attachment:    Type:   Image     Comment:   External Document

## 2010-04-11 NOTE — Progress Notes (Signed)
Summary: need rx for needles  Phone Note Call from Patient Call back at Home Phone [phone]   Caller: Patient----live call Summary of Call: was seen this morning. he will need a rx for the BD ultrafine needles. [facility]. Initial call taken by: Warnell Forester,  May 06, 2009 12:42 PM  Follow-up for Phone Call        Rx sent electronically Follow-up by: Romualdo Bolk, CMA (AAMA),  May 06, 2009 1:15 PM    New/Updated Medications: BD PEN NEEDLE ULTRAFINE 29G X 12.7MM MISC (INSULIN PEN NEEDLE) use as directed Prescriptions: BD PEN NEEDLE ULTRAFINE 29G X 12.7MM MISC (INSULIN PEN NEEDLE) use as directed  #100 x 11   Entered by:   Romualdo Bolk, CMA (AAMA)   Authorized by:   Madelin Headings MD   Signed by:   Romualdo Bolk, CMA (AAMA) on 05/06/2009   Method used:   [facility]        [phone] ([facility]ail)       [address]       [place], [place]  [address]       Ph: [phone] or [phone]       Fax: [phone]   RxID:   [id number]

## 2010-04-11 NOTE — Procedures (Signed)
Summary: Colonoscopy  Patient: Troy [name] Note: All result statuses are Final unless otherwise noted.  Tests: (1) Colonoscopy (COL)   COL Colonoscopy           DONE     [facility]     [address]     [place], [place]  [address]           COLONOSCOPY PROCEDURE REPORT           PATIENT:  Troy [name], Troy [name]  MR#:  [id number]     BIRTHDATE:  29-Sep-1970, 38 yrs. old  GENDER:  male           ENDOSCOPIST:  Judie Petit T. Russella Dar, MD, [facility]     Referred by:  Neta Mends. [name], M.D.           PROCEDURE DATE:  07/12/2009     PROCEDURE:  Colonoscopy with biopsy and snare polypectomy     ASA CLASS:  Class III     INDICATIONS:  1) unexplained diarrhea           MEDICATIONS:   Fentanyl 50 mcg IV, Versed 5 mg IV           DESCRIPTION OF PROCEDURE:   After the risks benefits and     alternatives of the procedure were thoroughly explained, informed     consent was obtained.  Digital rectal exam was performed and     revealed no abnormalities.   The LB [id number] endoscope     was introduced through the anus and advanced to the terminal ileum     which was intubated for a short distance, without limitations.     The quality of the prep was good, using MoviPrep.  The instrument     was then slowly withdrawn as the colon was fully examined.     <<PROCEDUREIMAGES>>           FINDINGS:  A normal appearing cecum, ileocecal valve, and     appendiceal orifice were identified. The ascending, hepatic     flexure, transverse, splenic flexure, descending, and rectum     appeared unremarkable. Random biopsies were obtained throughout     the colon and sent to pathology.  A sessile polyp was found in the     sigmoid colon. It was 4 mm in size. The polyp was removed using     cold biopsy forceps.  A sessile polyp was found in the sigmoid     colon. It was 5 mm in size. Polyp was snared without cautery.     Retrieval was successful. The terminal ileum appeared normal.     Retroflexed views in  the rectum revealed no abnormalities. The     time to cecum =  2  minutes. The scope was then withdrawn (time =     11  min) from the patient and the procedure completed.           COMPLICATIONS:  None           ENDOSCOPIC IMPRESSION:     1) Normal colon     2) 4 mm sessile polyp in the sigmoid colon     3) 5 mm sessile polyp in the sigmoid colon     4) Normal terminal ileum           RECOMMENDATIONS:     1) Await pathology results     2) If the polyps removed today are adenomatous (pre-cancerous),     you  will need a repeat colonoscopy in 5 years. Otherwise you     should continue to follow colorectal cancer screening guidelines     for "routine risk" patients with colonoscopy at age 22.    3) call     office next 1-3 days to schedule followup visit in 4-6 weeks           Judie Petit T. Russella Dar, MD, [facility]           n.     eSIGNED:   Venita Lick. Takeira Yanes at 07/12/2009 11:23 AM           Ricarda Frame, [id number]  Note: An exclamation mark (!) indicates a result that was not dispersed into the flowsheet. Document Creation Date: 07/12/2009 11:24 AM _______________________________________________________________________  (1) Order result status: Final Collection or observation date-time: 07/12/2009 11:17 Requested date-time:  Receipt date-time:  Reported date-time:  Referring Physician:   Ordering Physician: Claudette Head [phone]) Specimen Source:  Source: Launa Grill Order Number: [id number] Lab site:   Appended Document: Colonoscopy     Procedures Next Due Date:    Colonoscopy: 07/2014

## 2010-04-11 NOTE — Letter (Signed)
Summary: New Patient letter  [facility]  [address] [place], [place] [address]   Phone: [phone]  Fax: [phone]       05/09/2009 MRN: [id number]  SHELLY SHOULTZ [address] [place], [place]  [address]  Dear Mr. KLUGER,  Welcome to the Gastroenterology Division at [facility].    You are scheduled to see Dr.  Russella Dar on 05-31-09 at 9:30AM on the 3rd floor at [facility], [address]  We ask that you try to arrive at our office 15 minutes prior to your appointment time to allow for check-in.  We would like you to complete the enclosed self-administered evaluation form prior to your visit and bring it with you on the day of your appointment.  We will review it with you.  Also, please bring a complete list of all your medications or, if you prefer, bring the medication bottles and we will list them.  Please bring your insurance card so that we may make a copy of it.  If your insurance requires a referral to see a specialist, please bring your referral form from your primary care physician.  Co-payments are due at the time of your visit and may be paid by cash, check or credit card.     Your office visit will consist of a consult with your physician (includes a physical exam), any laboratory testing he/she may order, scheduling of any necessary diagnostic testing (e.g. x-ray, ultrasound, CT-scan), and scheduling of a procedure (e.g. Endoscopy, Colonoscopy) if required.  Please allow enough time on your schedule to allow for any/all of these possibilities.    If you cannot keep your appointment, please call [phone] to cancel or reschedule prior to your appointment date.  This allows [name] the opportunity to schedule an appointment for another patient in need of care.  If you do not cancel or reschedule by 5 p.m. the business day prior to your appointment date, you will be charged a $50.00 late cancellation/no-show fee.    Thank you for choosing  [facility] for your medical needs.  We appreciate the opportunity to care for you.  Please visit [name] at our website  to learn more about our practice.                     Sincerely,                                                             The Gastroenterology Division

## 2010-04-11 NOTE — Progress Notes (Signed)
  Phone Note Outgoing Call   Summary of Call: tell patient I reviewed his  Bgs .  getting better but not at goal   Follow-up for Phone Call        we can add  Actos 30 mg per day but is not a generic and dont know cost . disp# 30 1 by mouth once daily  can increase  Humalog to 8-10 units before largest meal in the meantime.  continue  monitoring 1-2 x per day . untill controlled   keep rov as prev recommended  Follow-up by: Madelin Headings MD,  May 25, 2009 9:31 AM  Additional Follow-up for Phone Call Additional follow up Details #1::        LMTOCB Additional Follow-up by: Romualdo Bolk, CMA Duncan Dull),  May 25, 2009 10:39 AM    Additional Follow-up for Phone Call Additional follow up Details #2::    Pt called back saying that he does want to do the Actos and he is aware of the increase in Humalog. Follow-up by: Romualdo Bolk, CMA (AAMA),  May 25, 2009 2:33 PM  New/Updated Medications: HUMALOG KWIKPEN 100 UNIT/ML SOLN (INSULIN LISPRO (HUMAN)) 8-10 units before larges meal once a day  or as directed ACTOS 30 MG TABS (PIOGLITAZONE HCL) 1 by mouth once daily Prescriptions: ACTOS 30 MG TABS (PIOGLITAZONE HCL) 1 by mouth once daily  #30 x 0   Entered by:   Romualdo Bolk, CMA (AAMA)   Authorized by:   Madelin Headings MD   Signed by:   Romualdo Bolk, CMA (AAMA) on 05/25/2009   Method used:   [facility]        [phone][facility] (retail[facility]       [address]       [place], [place]  [address]       Ph: [phone] or [phone]       Fax: [phone]   RxID:   [id number]

## 2010-04-11 NOTE — Letter (Signed)
Summary: Diabetic Instructions  [facility]  [address] [place], [place] [address]   Phone: [phone]  Fax: [phone]    Troy [name] [birth year] MRN: [id number]   _x  _   ORAL DIABETIC MEDICATION INSTRUCTIONS           (Metformin, Actos) The day before your procedure:   Take your diabetic pill as you do normally  The day of your procedure:   Do not take your diabetic pill    We will check your blood sugar levels during the admission process and again in Recovery before discharging you home  ________________________________________________________________________   _ x _   INSULIN (SHORT ACTING) MEDICATION INSTRUCTIONS (Regular, Humulog, Novolog)   The day before your procedure:   Do not take your evening dose   The day of your procedure:   Do not take your morning dose

## 2010-04-11 NOTE — Letter (Signed)
Summary: CMN request for CPAP Supplies/[facility]  CMN request for CPAP Supplies/[facility]   Imported By: Sherian Rein 08/18/2009 09:08:34  _____________________________________________________________________  External Attachment:    Type:   Image     Comment:   External Document

## 2010-04-11 NOTE — Progress Notes (Signed)
Summary: REFILL  Phone Note Refill Request Message from:  Fax from Pharmacy  Refills Requested: Medication #1:  METFORMIN HCL 500 MG TABS 1 by mouth two times a day [facility]---[phone]  Initial call taken by: Warnell Forester,  May 06, 2009 2:37 PM    Prescriptions: METFORMIN HCL 500 MG TABS (METFORMIN HCL) 1 by mouth two times a day  #180 x 1   Entered by:   Romualdo Bolk, CMA (AAMA)   Authorized by:   Madelin Headings MD   Signed by:   Romualdo Bolk, CMA (AAMA) on 05/06/2009   Method used:   Electronically to        [facility]* (mail-order)             ,          Ph: [phone]       Fax: [phone]   RxID:   [id number]

## 2010-04-11 NOTE — Assessment & Plan Note (Signed)
Summary: 4 month rov/njr   Vital Signs:  Patient profile:   40 year old male Weight:      349 pounds Pulse rate:   72 / minute BP sitting:   110 / 80  (left arm) Cuff size:   large  Vitals Entered By: Romualdo Bolk, CMA (AAMA) (December 09, 2009 11:04 AM) CC: Follow-up visit on labs CBG Result 204   History of Present Illness: Troy [name] comes in today  today for follow up of multiple medical problems . since his last visit he denies any major changes in his medical status. However he hasn'tt taken any meds for 3 months  .  he has continued his Humalog 8 to 10 units at his largest meal. He states that he had not been taking medicine because he couldn't find it after the move to their house. There are financial concerns. He was feeling okay and denies vision change polyurea polydipsia. His diarrhea has been better and was not related to the metformin.  No pulmonary signs 'No cv  signs . he feels his sleep is better recently although his sleep schedule  has changed.    Preventive Screening-Counseling & Management  Alcohol-Tobacco     Alcohol drinks/day: 0     Smoking Status: never  Caffeine-Diet-Exercise     Caffeine use/day: 0     Does Patient Exercise: yes  Current Medications (verified): 1)  Aspirin 325 Mg Tabs (Aspirin) .... Take One By Mouth At Bedtime 2)  Proair Hfa 108 (90 Base) Mcg/act  Aers (Albuterol Sulfate) .[name].. 1-2 Puffs Qid As Needed Wheezing 3)  Symbicort 160-4.5 Mcg/act  Aero (Budesonide-Formoterol Fumarate) .... 2 Puffs Two Times A Day As Needed 4)  Metformin Hcl 500 Mg Tabs (Metformin Hcl) .[name].. 1 By Mouth Two Times A Day 5)  Hydrocodone-Acetaminophen 5-325 Mg Tabs (Hydrocodone-Acetaminophen) .... Take By Mouth As Needed 6)  Simvastatin 40 Mg Tabs (Simvastatin) .[name].. 1 By Mouth Once Daily 7)  Onglyza 5 Mg Tabs (Saxagliptin Hcl) .[name].. 1 By Mouth Once Daily 8)  Humalog Kwikpen 100 Unit/ml Soln (Insulin Lispro (Human)) .... 8-10 Units Before Larges Meal  Once A Day  or As Directed 9)  Bd Pen Needle Ultrafine 29g X 12.63mm Misc (Insulin Pen Needle) .... Use As Directed 10)  Actos 30 Mg Tabs (Pioglitazone Hcl) .[name].. 1 By Mouth Once Daily 11)  Freestyle Lite Test  Strp (Glucose Blood) .... Use Two Times A Day 12)  Creon 24000 Unit Cpep (Pancrelipase (Lip-Prot-Amyl)) .... One Tablet By Mouth Before Meals  Allergies (verified): 1)  ! * Picc Line  Past History:  Past medical, surgical, family and social histories (including risk factors) reviewed, and no changes noted (except as noted below).  Past Medical History: Reviewed history from 08/05/2009 and no changes required. DVT, hx of OSA  Diabetes Obesity Residual weakness right hand arm  Consults Dr. Oliver Hum Dr. Hart [place]  Past Surgical History: Reviewed history from 05/31/2009 and no changes required. hx R fibula fx and required surgery and a  plate and pin, removed due to infection 1997 laceration right ulnar nerve repaired by Dr. Ozella Almond Left elbow surgery  Cubital tunnel left arm 2003 C6/C7 disc surgery 2009  Past History:  Care Management: Neurology: Lovell Sheehan Pulmonary: [facility] GI: Urology: Allaince Urology Dr? Gastroenterology: Russella Dar  Family History: Reviewed history from 05/31/2009 and no changes required. Family History Hypertension father had cervical and lumbar disk disease mother has hypertension DVTs, sleep apnea, and obesity.   No brothers no sisters  allergies: father heart disease: mother clotting disorders: mother  father Sudden death  02-15-10No FH of Colon Cancer:  Social History: Reviewed history from 08/05/2009 and no changes required. 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  Just moved to 4 r house up and down stairs  sleep  4  am slepp until   12 and 1 pm.  eating regularly .  father died.  suddenly .   05/01/2008 Daily Caffeine Use  one a day  Review of Systems  The patient denies anorexia, fever, weight loss, vision loss, decreased hearing, chest pain, syncope, dyspnea on exertion, prolonged cough, abdominal pain, melena, hematochezia, severe indigestion/heartburn, muscle weakness, abnormal bleeding, enlarged lymph nodes, and angioedema.         has had some tingling and pain in his wrist that were carpal tunnel received when he changed his workstation  Physical Exam  General:  Well-developed,well-nourished,in no acute distress; alert,appropriate and cooperative throughout examination  sleepy  Head:  normocephalic and atraumatic.   Eyes:  vision grossly intact, pupils equal, and pupils round.   Neck:  No deformities, masses, or tenderness noted. Lungs:  normal respiratory effort and no intercostal retractions.   Heart:  Normal rate and regular rhythm. S1 and S2 normal without gallop, murmur, click, rub or other extra sounds. Msk:  old scar on righ t arm  no atrophy  Pulses:  pulses intact without delay   Extremities:  stocking on of right lower extremity left foot within normal limits no neuropathy Neurologic:  alert & oriented X3.   Skin:  turgor normal, color normal, no ecchymoses, and no petechiae.   Cervical Nodes:  No lymphadenopathy noted Psych:  Oriented X3, good eye contact, not anxious appearing, and not depressed appearing.     Impression & Recommendations:  Problem # 1:  DIABETES MELLITUS (ICD-250.00) Assessment Deteriorated stopped all med except insulin cuase couldn find it in the move and financial issues    but now has these to star .option sssidscussed    will restart meds  and rov in 1-2 months     need to ge tin control  His updated medication list for this problem includes:    Aspirin 325 Mg Tabs (Aspirin) .[name]... Take one by mouth at bedtime    Metformin Hcl 500 Mg Tabs (Metformin hcl)  .[name]... 1 by mouth two times a day    Onglyza 5 Mg Tabs (Saxagliptin hcl) .[name]... 1 by mouth once daily    Humalog Kwikpen 100 Unit/ml Soln (Insulin lispro (human)) .[name]... 8-10 units before larges meal once a day  or as directed    Actos 30 Mg Tabs (Pioglitazone hcl) .[name]... 1 by mouth once daily  Orders: Capillary Blood Glucose/CBG (29562)  Problem # 2:  cts   Problem # 3:  HYPERLIPIDEMIA (ICD-272.4)  His updated medication list for this problem includes:    Simvastatin 40 Mg Tabs (Simvastatin) .[name]... 1 by mouth once daily  Problem # 4:  OBSTRUCTIVE SLEEP APNEA (ICD-327.23) says he is sleeping better  Problem # 5:  MORBID OBESITY (ICD-278.01) Assessment: Deteriorated  Ht: 70.5 (08/05/2009)   Wt: 349 (12/09/2009)   BMI: 47.70 (08/05/2009)  Complete Medication List: 1)  Aspirin 325 Mg Tabs (Aspirin) .... Take one  by mouth at bedtime 2)  Proair Hfa 108 (90 Base) Mcg/act Aers (Albuterol sulfate) .[name].. 1-2 puffs qid as needed wheezing 3)  Symbicort 160-4.5 Mcg/act Aero (Budesonide-formoterol fumarate) .... 2 puffs two times a day as needed 4)  Metformin Hcl 500 Mg Tabs (Metformin hcl) .[name].. 1 by mouth two times a day 5)  Hydrocodone-acetaminophen 5-325 Mg Tabs (Hydrocodone-acetaminophen) .... Take by mouth as needed 6)  Simvastatin 40 Mg Tabs (Simvastatin) .[name].. 1 by mouth once daily 7)  Onglyza 5 Mg Tabs (Saxagliptin hcl) .[name].. 1 by mouth once daily 8)  Humalog Kwikpen 100 Unit/ml Soln (Insulin lispro (human)) .... 8-10 units before larges meal once a day  or as directed 9)  Bd Pen Needle Ultrafine 29g X 12.59mm Misc (Insulin pen needle) .... Use as directed 10)  Actos 30 Mg Tabs (Pioglitazone hcl) .[name].. 1 by mouth once daily 11)  Freestyle Lite Test Strp (Glucose blood) .... Use two times a day 12)  Creon 24000 Unit Cpep (Pancrelipase (lip-prot-amyl)) .... One tablet by mouth before meals  Other Orders: Admin 1st Vaccine (04540) Flu Vaccine 60yrs + (98119)  Patient Instructions: 1)  go back  on medication  as your diabetes is out of control. 2)  get back on medication    and lose weight  3)  your diabetes is under control . 4)  ROV   in 1-2 months  or as needed. Flu Vaccine Consent Questions     Do you have a history of severe allergic reactions to this vaccine? no    Any prior history of allergic reactions to egg and/or gelatin? no    Do you have a sensitivity to the preservative Thimersol? no    Do you have a past history of Guillan-Barre Syndrome? no    Do you currently have an acute febrile illness? no    Have you ever had a severe reaction to latex? no    Vaccine information given and explained to patient? yes    Are you currently pregnant? no    Lot Number:[id number]   Exp Date:09/09/2010   Site Given  Left Deltoid IM       .lbflu greater than 50% of visit spent in counseling   25 minutes

## 2010-04-11 NOTE — Letter (Signed)
Summary: [facility] Instructions  [facility]  [address] [place], [place] [address]   Phone: [phone]  Fax: [phone]       Troy [name]    [birth year]    MRN: [id number]        Procedure Day /Date: Tuesday May 3rd, 2011     Arrival Time: 9:30am     Procedure Time: 10:30am     Location of Procedure:                    _ x_  [facility] (4th Floor)                        PREPARATION FOR COLONOSCOPY WITH MOVIPREP   Starting 5 days prior to your procedure  07/09/09 do not eat nuts, seeds, popcorn, corn, beans, peas,  salads, or any raw vegetables.  Do not take any fiber supplements (e.g. Metamucil, Citrucel, and Benefiber).  THE DAY BEFORE YOUR PROCEDURE         DATE:  07/11/09   DAY:  Monday   1.  Drink clear liquids the entire day-NO SOLID FOOD  2.  Do not drink anything colored red or purple.  Avoid juices with pulp.  No orange juice.  3.  Drink at least 64 oz. (8 glasses) of fluid/clear liquids during the day to prevent dehydration and help the prep work efficiently.  CLEAR LIQUIDS INCLUDE: Water Jello Ice Popsicles Tea (sugar ok, no milk/cream) Powdered fruit flavored drinks Coffee (sugar ok, no milk/cream) Gatorade Juice: apple, white grape, white cranberry  Lemonade Clear bullion, consomm, broth Carbonated beverages (any kind) Strained chicken noodle soup Hard Candy                             4.  In the morning, mix first dose of MoviPrep solution:    Empty 1 Pouch A and 1 Pouch B into the disposable container    Add lukewarm drinking water to the top line of the container. Mix to dissolve    Refrigerate (mixed solution should be used within 24 hrs)  5.  Begin drinking the prep at 5:00 p.m. The MoviPrep container is divided by 4 marks.   Every 15 minutes drink the solution down to the next mark (approximately 8 oz) until the full liter is complete.   6.  Follow completed prep with 16 oz of clear liquid of your choice  (Nothing red or purple).  Continue to drink clear liquids until bedtime.  7.  Before going to bed, mix second dose of MoviPrep solution:    Empty 1 Pouch A and 1 Pouch B into the disposable container    Add lukewarm drinking water to the top line of the container. Mix to dissolve    Refrigerate  THE DAY OF YOUR PROCEDURE      DATE:  07/12/09  DAY:  Tuesday  Beginning at  5:30 a.m. (5 hours before procedure):         1. Every 15 minutes, drink the solution down to the next mark (approx 8 oz) until the full liter is complete.  2. Follow completed prep with 16 oz. of clear liquid of your choice.    3. You may drink clear liquids until  8:30am (2 HOURS BEFORE PROCEDURE).   MEDICATION INSTRUCTIONS  Unless otherwise instructed, you should take regular prescription medications with a small sip of water  as early as possible the morning of your procedure.  Diabetic patients - see separate instructions.          OTHER INSTRUCTIONS  You will need a responsible adult at least 40 years of age to accompany you and drive you home.   This person must remain in the waiting room during your procedure.  Wear loose fitting clothing that is easily removed.  Leave jewelry and other valuables at home.  However, you may wish to bring a book to read or  an iPod/MP3 player to listen to music as you wait for your procedure to start.  Remove all body piercing jewelry and leave at home.  Total time from sign-in until discharge is approximately 2-3 hours.  You should go home directly after your procedure and rest.  You can resume normal activities the  day after your procedure.  The day of your procedure you should not:   Drive   Make legal decisions   Operate machinery   Drink alcohol   Return to work  You will receive specific instructions about eating, activities and medications before you leave.    The above instructions have been reviewed and explained to me by    Marchelle Folks.    I fully understand and can verbalize these instructions _____________________________ Date _________

## 2010-04-11 NOTE — Consult Note (Signed)
Summary: [facility]  [facility]   Imported By: Maryln Gottron 06/13/2009 10:21:55  _____________________________________________________________________  External Attachment:    Type:   Image     Comment:   External Document

## 2010-04-11 NOTE — Medication Information (Signed)
Summary: Clarification of Order for Actos  Clarification of Order for Actos   Imported By: Maryln Gottron 06/09/2009 14:03:24  _____________________________________________________________________  External Attachment:    Type:   Image     Comment:   External Document

## 2010-04-11 NOTE — Assessment & Plan Note (Signed)
Summary: 1 month rov/njr   Vital Signs:  Patient profile:   40 year old male Weight:      347 pounds Pulse rate:   80 / minute BP sitting:   120 / 80  (right arm) Cuff size:   large  Vitals Entered By: Romualdo Bolk, CMA Duncan Dull) (June 03, 2009 11:16 AM) CC: follow-up visit CBG Result 137   History of Present Illness: Troy [name] comes in for follow up of his diabetes which has gone out of control from the last visit probably from dietary changes after some stresses.  since last visit had begun   actos and 7-8 units  rapid insulin with largest   meal without se and bgs over the last 2 weeks have all  been under 140 and no lows.    Moving and job has been perserved to "work at   home"  [employer].   Current Medications (verified): 1)  Aspirin 325 Mg Tabs (Aspirin) .... Take One By Mouth At Bedtime 2)  Proair Hfa 108 (90 Base) Mcg/act  Aers (Albuterol Sulfate) .[name].. 1-2 Puffs Qid As Needed Wheezing 3)  Symbicort 160-4.5 Mcg/act  Aero (Budesonide-Formoterol Fumarate) .... 2 Puffs Two Times A Day As Needed 4)  Metformin Hcl 500 Mg Tabs (Metformin Hcl) .[name].. 1 By Mouth Two Times A Day 5)  Hydrocodone-Acetaminophen 5-325 Mg Tabs (Hydrocodone-Acetaminophen) .... Take By Mouth As Needed 6)  Simvastatin 40 Mg Tabs (Simvastatin) .[name].. 1 By Mouth Once Daily 7)  Onglyza 5 Mg Tabs (Saxagliptin Hcl) .[name].. 1 By Mouth Once Daily 8)  Humalog Kwikpen 100 Unit/ml Soln (Insulin Lispro (Human)) .... 8-10 Units Before Larges Meal Once A Day  or As Directed 9)  Bd Pen Needle Ultrafine 29g X 12.39mm Misc (Insulin Pen Needle) .... Use As Directed 10)  Actos 30 Mg Tabs (Pioglitazone Hcl) .[name].. 1 By Mouth Once Daily 11)  Moviprep 100 Gm  Solr (Peg-Kcl-Nacl-Nasulf-Na Asc-C) .... As Per Prep Instructions. 12)  Metronidazole 500 Mg Tabs (Metronidazole) .... One Tablet By Mouth Two Times A Day 13)  Freestyle Lite Test  Strp (Glucose Blood) .... Use Two Times A Day  Allergies (verified): 1)  ! * Picc  Line  Past History:  Past medical, surgical, family and social histories (including risk factors) reviewed for relevance to current acute and chronic problems.  Past Medical History: Reviewed history from 05/31/2009 and no changes required. DVT, hx of OSA  Diabetes Obesity  Consults Dr. Oliver Hum Dr. Hart [place]  Past Surgical History: Reviewed history from 05/31/2009 and no changes required. hx R fibula fx and required surgery and a  plate and pin, removed due to infection 1997 laceration right ulnar nerve repaired by Dr. Ozella Almond Left elbow surgery  Cubital tunnel left arm 2003 C6/C7 disc surgery 2009  Past History:  Care Management: Neurology: Lovell Sheehan Pulmonary: [facility] GI: Urology:  Family History: Reviewed history from 05/31/2009 and no changes required. Family History Hypertension father had cervical and lumbar disk disease mother has hypertension DVTs, sleep apnea, and obesity.   No brothers no sisters  allergies: father heart disease: mother clotting disorders: mother  father Sudden death  2010-02-21No FH of Colon Cancer:  Social History: Reviewed history from 05/31/2009 and no changes required. 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  Momwas sick recently helping  out father died.  suddenly .   05/27/08 Daily Caffeine Use  one a day  Review of Systems       no change  Physical Exam  General:  Well-developed,well-nourished,in no acute distress; alert,appropriate and cooperative throughout examination Psych:  Oriented X3 and good eye contact.   reviewed BG readings  Impression & Recommendations:  Problem # 1:  DIABETES MELLITUS (ICD-250.00) Assessment Improved to early for hg a1c  again His updated medication list for this problem includes:    Aspirin 325 Mg Tabs (Aspirin) .[name]... Take one  by mouth at bedtime    Metformin Hcl 500 Mg Tabs (Metformin hcl) .[name]... 1 by mouth two times a day    Onglyza 5 Mg Tabs (Saxagliptin hcl) .[name]... 1 by mouth once daily    Humalog Kwikpen 100 Unit/ml Soln (Insulin lispro (human)) .[name]... 8-10 units before larges meal once a day  or as directed    Actos 30 Mg Tabs (Pioglitazone hcl) .[name]... 1 by mouth once daily  Orders: Capillary Blood Glucose/CBG (60454)  Problem # 2:  MORBID OBESITY (ICD-278.01) counseled about  lifestyle intervention    again  Problem # 3:  DIARRHEA (ICD-787.91) under evaluation per GI  Complete Medication List: 1)  Aspirin 325 Mg Tabs (Aspirin) .... Take one by mouth at bedtime 2)  Proair Hfa 108 (90 Base) Mcg/act Aers (Albuterol sulfate) .[name].. 1-2 puffs qid as needed wheezing 3)  Symbicort 160-4.5 Mcg/act Aero (Budesonide-formoterol fumarate) .... 2 puffs two times a day as needed 4)  Metformin Hcl 500 Mg Tabs (Metformin hcl) .[name].. 1 by mouth two times a day 5)  Hydrocodone-acetaminophen 5-325 Mg Tabs (Hydrocodone-acetaminophen) .... Take by mouth as needed 6)  Simvastatin 40 Mg Tabs (Simvastatin) .[name].. 1 by mouth once daily 7)  Onglyza 5 Mg Tabs (Saxagliptin hcl) .[name].. 1 by mouth once daily 8)  Humalog Kwikpen 100 Unit/ml Soln (Insulin lispro (human)) .... 8-10 units before larges meal once a day  or as directed 9)  Bd Pen Needle Ultrafine 29g X 12.58mm Misc (Insulin pen needle) .... Use as directed 10)  Actos 30 Mg Tabs (Pioglitazone hcl) .[name].. 1 by mouth once daily 11)  Moviprep 100 Gm Solr (Peg-kcl-nacl-nasulf-na asc-c) .... As per prep instructions. 12)  Metronidazole 500 Mg Tabs (Metronidazole) .... One tablet by mouth two times a day 13)  Freestyle Lite Test Strp (Glucose blood) .... Use two times a day  Patient Instructions: 1)  continue weight loss 2)  and medication. 3)  CPX   end of MAY    4)  CPX labs   v70.0,   5)  HgBA1c prior to visit  ICD-9:  250. 6)  Urine Microalbumin prior to visit ICD-9 :    Prescriptions: HUMALOG KWIKPEN 100 UNIT/ML SOLN (INSULIN LISPRO (HUMAN)) 8-10 units before larges meal once a day  or as directed  #3 x 11   Entered by:   Romualdo Bolk, CMA (AAMA)   Authorized by:   Madelin Headings MD   Signed by:   Romualdo Bolk, CMA (AAMA) on 06/03/2009   Method used:   Electronically to        [facility] [phone]* (retail)       [address]       [place], [place]  [address]       Ph: [phone] or [phone]       Fax: [phone]   RxID:   [id number] FREESTYLE LITE TEST  STRP (GLUCOSE BLOOD) use two  times a day  #100 x 11   Entered by:   Romualdo Bolk, CMA (AAMA)   Authorized by:   Madelin Headings MD   Signed by:   Romualdo Bolk, CMA (AAMA) on 06/03/2009   Method used:   Electronically to        [facility] [phone]* (retail)       [address]       [place], [place]  [address]       Ph: [phone] or [phone]       Fax: [phone]   RxID:   [id number]   Prevention & Chronic Care Immunizations   Influenza vaccine: Historical  (12/27/2008)   Influenza vaccine due: 02/13/2008    Tetanus booster: 02/13/2007: Td   Tetanus booster due: 02/12/2017    Pneumococcal vaccine: Not documented  Other Screening   Smoking status: never  (05/06/2009)  Diabetes Mellitus   HgbA1C: 9.0  (04/29/2009)   Hemoglobin A1C due: 07/27/2009    Eye exam: normal  (01/11/2008)   Eye exam due: 01/2009    Foot exam: yes  (05/06/2009)   High risk foot: Not documented   Foot care education: Not documented    Urine microalbumin/creatinine ratio: 9.9  (07/16/2008)   Urine microalbumin/cr due: 07/16/2009    Diabetes flowsheet reviewed?: Yes   Progress toward A1C goal: Improved    Stage of readiness to change (diabetes management): Action  Lipids   Total Cholesterol: 128  (04/29/2009)   LDL: 53  (04/29/2009)   LDL Direct: 94.7  (10/08/2008)   HDL: 37.70  (04/29/2009)   Triglycerides: 189.0  (04/29/2009)    SGOT (AST): 21   (04/29/2009)   SGPT (ALT): 29  (04/29/2009)   Alkaline phosphatase: 52  (04/29/2009)   Total bilirubin: 0.5  (04/29/2009)    Lipid flowsheet reviewed?: Yes   Progress toward LDL goal: Improved  Self-Management Support :    Patient will work on the following items until the next clinic visit to reach self-care goals:     Medications and monitoring: take my medicines every day, check my blood sugar  (06/03/2009)    Diabetes self-management support: Not documented    Lipid self-management support: Not documented

## 2010-04-26 ENCOUNTER — Encounter: Payer: Self-pay | Admitting: Pulmonary Disease

## 2010-05-18 NOTE — Letter (Signed)
Summary: CP[facility] / [facility]  CPAP Supplies / [facility]   Imported By: Lennie Odor 05/05/2010 11:45:02  _____________________________________________________________________  External Attachment:    Type:   Image     Comment:   External Document

## 2010-05-30 ENCOUNTER — Telehealth: Payer: Self-pay | Admitting: *Deleted

## 2010-05-30 DIAGNOSIS — G4733 Obstructive sleep apnea (adult) (pediatric): Secondary | ICD-10-CM

## 2010-05-30 DIAGNOSIS — E119 Type 2 diabetes mellitus without complications: Secondary | ICD-10-CM

## 2010-05-30 LAB — GLUCOSE, CAPILLARY: Glucose-Capillary: 133 mg/dL — ABNORMAL HIGH (ref 70–99)

## 2010-05-30 NOTE — Telephone Encounter (Signed)
Pt is wanting to know what labs he needs done prior to his appt. Pt was last seen on 12/09/09 and was suppose to follow up with [name] in 1-2 months but is just now getting back with [name].

## 2010-06-02 NOTE — Telephone Encounter (Signed)
Left message for pt to call back to schedule a non fasting lab appt and rov. Orders in epic

## 2010-06-02 NOTE — Telephone Encounter (Signed)
BMP hg a1c  Urine microalbumin cr ratio   tsh Dx diabetes obesiry sleep apnea

## 2010-07-25 NOTE — Op Note (Signed)
NAME:  Troy [name], Troy [name] NO.:  [id number]   MEDICAL RECORD NO.:  [id number]          PATIENT TYPE:  OIB   LOCATION:  3004                         FACILITY:  [facility]   PHYSICIAN:  Cristi Loron, M.D.DATE OF BIRTH:  [birth year]   DATE OF PROCEDURE:  01/29/2007  DATE OF DISCHARGE:                               OPERATIVE REPORT   BRIEF HISTORY:  The patient is a 40 year old white male who has suffered  from neck and left arm pain consistent with left C8 radiculopathy.  He  failed medical management, was worked up with a cervical MRI which  demonstrated a large herniated disc at C7-T1 on the left.  I discussed  the various treatment options with the patient including surgery.  He  has weighed the risks, benefits, and alternatives of proceed and decided  to proceed with a C7-T1 anterior cervical discectomy, interbody  arthrodesis prosthesis, as well as anterior cervical plating.   PREOPERATIVE DIAGNOSES:  Left C7-T1 herniated nucleus pulposus, cervical  radiculopathy, cervical myelopathy, disc degeneration, and spondylosis.   POSTOPERATIVE DIAGNOSES:  Left C7-T1 herniated nucleus pulposus,  cervical radiculopathy, cervical myelopathy, disc degeneration, and  spondylosis.   PROCEDURE:  C7-T1 extensive anterior cervical discectomy/decompression;  C7-T1 anterior interbody arthrodesis with local morselized autograft  bone and VITOSS bone-graft extender; insertion of C7-T1 anterior  interbody prosthesis (Alphatec medium PEEK interbody prosthesis); C7-T1  anterior cervical plating with Codman Slim-Loc titanium plate and  screws.   SURGEON:  Cristi Loron, MD   ASSISTANT:  Danae Orleans. Venetia Maxon, MD   ANESTHESIA:  General endotracheal.   ESTIMATED BLOOD LOSS:  50 mL.   SPECIMENS:  None.   DRAINS:  None.   COMPLICATIONS:  None.   DESCRIPTION OF PROCEDURE:  The patient was brought to the operating room  by the anesthesia team.  General endotracheal anesthesia was  induced.  The patient remained in the supine position.  A roll was placed under  his shoulders to place his neck in a slight extension.  The anterior  cervical region was then prepared with Betadine scrub and Betadine  solution.  Sterile drapes were applied.  I then injected the area to be  incised with Marcaine with epinephrine solution.  I used a scalpel to  make a transverse incision in the patient's left anterior neck.  I used  the Metzenbaum scissors to divide the platysma muscle and then to  dissect medial to sternocleidomastoid muscle, jugular vein, and carotid  artery.  I carefully dissected down towards the anterior cervical spine  identifying the esophagus and retracting it medially.  I then used the  Kitner swabs to clear the soft tissue from the anterior cervical spine,  and then I inserted a bent spinal needle into the upper exposed  intervertebral disc space.  We obtained intraoperative radiograph to  confirm our location.  I then used electrocautery to detach the medial  border of the longus colli muscle bilaterally from C7-T1 intervertebral  disc space and then inserted the Caspar self-retaining retractor  underneath the longus colli muscle bilaterally to provide exposure.  We  then incised the C7-T1  intervertebral disc with a #15 blade scalpel and  performed a partial intervertebral discectomy using the pituitary  forceps and Carlen curettes.  We then inserted the distraction screws at  C7 and T1 and distracted the interspace and then used a high-speed drill  to decorticate the vertebral endplates at C7-T1, to drill away the  remainder of C7-T1 intervertebral disc, to drill away some posterior  spondylosis, and then to thin out the posterior longitudinal ligament.  We then incised the ligament with arachnoid knife and removed it with  the Kerrison punch undercutting the vertebral endplates at C7-T1  decompressing the thecal sac.  We then performed foraminotomy about the   bilateral C8 nerve roots.  Of note, we encountered the expected large  herniated disc on the left, severely compressing the left C8 nerve root.  We got a good decompression bilaterally.   We now turned attention to arthrodesis.  We used the trial spacers and  determined to use a 7-mm medium Alphatec PEEK interbody prosthesis.  We  prefilled this prosthesis with a combination of local autograft bone we  obtained during the decompression, as well as VITOSS bone-graft  extender.  We then inserted the prosthesis into the distracted  interspace and then removed the distraction screws.  There was a good  snug fit of the prosthesis in the interspace.  We then filled the  lateral end of disc space with VITOSS bone-graft extender.  This  completed the interbody arthrodesis.   We now turned attention to the anterior spinal instrumentation.  We used  a high-speed drill to remove some ventral spondylosis from the vertebral  endplates at C7-T1 so that the plate would lay down flat.  We selected  the appropriate length of Codman Slim-Loc anterior cervical plate and  laid it along the anterior aspect of vertebral bodies at C7-T1.  We then  drilled two 14-mm holes at C7 to T1.  We then secured the plate to the  vertebral bodies by placing two 14-mm self-tapping screws at C7 and at  T1.  We obtained intraoperative radiographs, but we could not see the  plate or screws because of the patient's body habitus but looked good in  vivo as was the prosthesis.  We then secured the screws and plate by  locking each cam completing the instrumentation.   We then obtained hemostasis using bipolar electrocautery.  We irrigated  the wound out with bacitracin solution.  We then removed the retractor  and then inspected the esophagus for any damage; there was none  appreciated.  We then reapproximated the patient's platysma muscle with  interrupted 3-0 Vicryl suture, subcutaneous tissue with interrupted 3-0  Vicryl  suture, and the skin with Steri-Strips and Benzoin.  The wound  was then coated with bacitracin ointment, and a sterile dressing was  applied.  The drapes were removed, and the patient was subsequently  extubated by the anesthesia team and transported to the post-anesthesia  care unit in a stable condition.  All sponge, instrument, and needle  counts were correct at the end of this case.      Cristi Loron, M.D.  Electronically Signed     JDJ/MEDQ  D:  01/29/2007  T:  01/30/2007  Job:  [id number]

## 2010-07-25 NOTE — Consult Note (Signed)
VASCULAR SURGERY CONSULTATION   Troy [name], Troy [name]  DOB:  1970-07-14                                       12/09/2007  [id number]#:[id number]   The patient is a 40 year old male referred by Dr. Fabian Sharp for vascular  surgery evaluation regarding swelling and pain in the right lower  extremity.  This patient had a complex fracture of his right fibula in  1997 requiring surgery and eventually had the plates removed from this  area because of some infection.  He had some mild swelling at that time  because of deep vein thrombosis in 2004 in the superficial femoral vein,  and has had chronic edema in the right leg since that time.  He has had  venous duplex exams since that time and thought that the vein was now  patent.  He developed some increasing redness and discoloration of his  right pretibial area in the lower 1/3 of the leg over a wide area  adjacent to the ankle fracture incision, and also has noticed some  tenderness just proximal to this over the tibia anteriorly.  He has had  no recurrent thrombophlebitis, no deep vein thrombosis, and was on  Coumadin for 2 years before it was discontinued.  He has no history of  stasis ulcers, bleeding, or other complications, other than severe  swelling in the right leg.  He has worn T.E.D. stockings (15 mm - short  leg) with slight improvement and he elevates the legs.   PAST MEDICAL HISTORY:  1. Diabetes.  2. Negative for coronary artery disease, hypertension, hyperlipidemia,      CVA, or COPD.   PREVIOUS SURGERY:  1. Fibula fracture in 1997.  2. Removal of plate 1 year later.  3. Tendon transplant to the right arm.  4. Elbow surgery on the left - unknown procedure.   FAMILY HISTORY:  Positive for diabetes and cardiac valve problems in his  mother.  Coronary artery disease in grandfather.  Negative for stroke.   SOCIAL HISTORY:  He is single, has 1 child.  Works in Clinical biochemist.  He does not use tobacco or  alcohol.   REVIEW OF SYSTEMS:  Significant only for his occasional mild asthma  symptoms, which are not constant.   ALLERGIES:  None known.   MEDICATIONS:  Metformin 1 time a day.   PHYSICAL EXAM:  Blood pressure 132/84, heart rate is 82, respirations  14.  Generally, he is a middle-aged male, in no apparent distress.  Alert and oriented x3.  Neck is supple with 3+ carotid pulses palpable.  No bruits are audible.  Neurologic exam is normal.  No palpable  adenopathy in the neck.  Chest is clear to auscultation.  Cardiovascular  exam is regular rate and rhythm.  No murmurs.  Abdomen is obese with no  palpable masses.  He has 3+ femoral, popliteal, and dorsalis pedis  pulses bilaterally.  Left leg has no edema or varicosities.  Right leg  has severe edema with the right distal thigh being 7 cm larger in  circumference compared to the left.  He has some hyperpigmentation in  the anterolateral aspect of the lower third of the leg over an area  measuring 6 x 8 cm with no ulceration noted.  Proximal to this, there is  a nodular area in the pretibial region, which is mildly tender  to  palpation with some discoloration.  No significant varicosities are  noted.  No spider veins are noted.   Venous duplex exam performed in our office revealed chronic occlusion of  the right superficial femoral vein secondary to old DVT with no acute  findings.  The right saphenous system revealed no reflux or  abnormalities.   I think his symptoms are all due to his chronic DVT, which occurred in  2004.  The best treatment would be management of the edema as best as  possible, which would include long-leg compression stockings, elevation  of the legs at night by elevating the foot of the bed, and possible  diuretics if felt indicated by his medical doctor.  I have discussed all  of this with him and have provided him with elastic compression  stockings to wear today and our office will see him back as  necessary.   Troy [name], M.D.  Electronically Signed  JDL/MEDQ  D:  12/09/2007  T:  12/10/2007  Job:  [id number]   cc:   Neta Mends. Fabian Sharp, MD

## 2010-07-25 NOTE — Procedures (Signed)
DUPLEX DEEP VENOUS EXAM - LOWER EXTREMITY   INDICATION:  Right lower extremity edema and redness.   HISTORY:  Edema:  Yes.  Trauma/Surgery:  No.  Pain:  No.  PE:  No.  Previous DVT:  Yes.  Anticoagulants:  None.  Other:   DUPLEX EXAM:                CFV   SFV   PopV  PTV    GSV                R  L  R  L  R  L  R   L  R  L  Thrombosis    o  o  +     o     o      o  Spontaneous   +  +  0     +     +      +  Phasic        +  +  0     +     +      +  Augmentation  +  +  0     +     +      +  Compressible  +  +  0     +     +      +  Competent     +  +  0     +     +      +   Legend:  + - yes  o - no  p - partial  D - decreased   IMPRESSION:  1. Chronic deep venous thrombosis with no flow noted at the proximal      superficial femoral vein.  2. No evidence of reflux noted in the right greater saphenous vein and      lesser saphenous vein.    _____________________________  Quita Skye. Hart Rochester, M.D.   MG/MEDQ  D:  12/09/2007  T:  12/09/2007  Job:  [id number]

## 2010-11-28 ENCOUNTER — Other Ambulatory Visit (INDEPENDENT_AMBULATORY_CARE_PROVIDER_SITE_OTHER): Payer: Self-pay

## 2010-11-28 DIAGNOSIS — E119 Type 2 diabetes mellitus without complications: Secondary | ICD-10-CM

## 2010-11-28 DIAGNOSIS — G4733 Obstructive sleep apnea (adult) (pediatric): Secondary | ICD-10-CM

## 2010-11-28 LAB — CBC WITH DIFFERENTIAL/PLATELET
Basophils Absolute: 0 10*3/uL (ref 0.0–0.1)
Eosinophils Absolute: 0 10*3/uL (ref 0.0–0.7)
HCT: 43.3 % (ref 39.0–52.0)
Hemoglobin: 14.5 g/dL (ref 13.0–17.0)
Lymphocytes Relative: 47.9 % — ABNORMAL HIGH (ref 12.0–46.0)
Lymphs Abs: 4.2 10*3/uL — ABNORMAL HIGH (ref 0.7–4.0)
MCHC: 33.6 g/dL (ref 30.0–36.0)
Neutro Abs: 4 10*3/uL (ref 1.4–7.7)
Platelets: 222 10*3/uL (ref 150.0–400.0)
RDW: 13.1 % (ref 11.5–14.6)

## 2010-11-28 LAB — POCT URINALYSIS DIPSTICK
Protein, UA: NEGATIVE
Spec Grav, UA: 1.02
Urobilinogen, UA: 0.2
pH, UA: 5

## 2010-11-28 LAB — LIPID PANEL
Cholesterol: 181 mg/dL (ref 0–200)
HDL: 36.4 mg/dL — ABNORMAL LOW (ref >39.00)
Total CHOL/HDL Ratio: 5
VLDL: 72.2 mg/dL — ABNORMAL HIGH (ref 0.0–40.0)

## 2010-11-28 LAB — BASIC METABOLIC PANEL
BUN: 14 mg/dL (ref 6–23)
CO2: 26 mEq/L (ref 19–32)
Calcium: 8.9 mg/dL (ref 8.4–10.5)
Creatinine, Ser: 0.8 mg/dL (ref 0.4–1.5)

## 2010-11-28 LAB — MICROALBUMIN / CREATININE URINE RATIO
Creatinine,U: 110.3 mg/dL
Microalb Creat Ratio: 2.3 mg/g (ref 0.0–30.0)

## 2010-11-28 LAB — HEMOGLOBIN A1C: Hgb A1c MFr Bld: 9.9 % — ABNORMAL HIGH (ref 4.6–6.5)

## 2010-11-28 LAB — HEPATIC FUNCTION PANEL
Albumin: 4 g/dL (ref 3.5–5.2)
Total Bilirubin: 0.6 mg/dL (ref 0.3–1.2)

## 2010-11-28 NOTE — Progress Notes (Signed)
Addended by: Bonnye Fava on: 11/28/2010 12:54 PM   Modules accepted: Orders

## 2010-12-04 ENCOUNTER — Encounter: Payer: Self-pay | Admitting: Internal Medicine

## 2010-12-05 ENCOUNTER — Ambulatory Visit (INDEPENDENT_AMBULATORY_CARE_PROVIDER_SITE_OTHER): Payer: BC Managed Care – PPO | Admitting: Internal Medicine

## 2010-12-05 ENCOUNTER — Encounter: Payer: Self-pay | Admitting: Internal Medicine

## 2010-12-05 VITALS — BP 120/80 | HR 78 | Ht 70.75 in | Wt 348.0 lb

## 2010-12-05 DIAGNOSIS — G4733 Obstructive sleep apnea (adult) (pediatric): Secondary | ICD-10-CM

## 2010-12-05 DIAGNOSIS — J45909 Unspecified asthma, uncomplicated: Secondary | ICD-10-CM

## 2010-12-05 DIAGNOSIS — E119 Type 2 diabetes mellitus without complications: Secondary | ICD-10-CM

## 2010-12-05 DIAGNOSIS — D126 Benign neoplasm of colon, unspecified: Secondary | ICD-10-CM

## 2010-12-05 DIAGNOSIS — K529 Noninfective gastroenteritis and colitis, unspecified: Secondary | ICD-10-CM | POA: Insufficient documentation

## 2010-12-05 DIAGNOSIS — Z86718 Personal history of other venous thrombosis and embolism: Secondary | ICD-10-CM

## 2010-12-05 DIAGNOSIS — R197 Diarrhea, unspecified: Secondary | ICD-10-CM

## 2010-12-05 DIAGNOSIS — Z0000 Encounter for general adult medical examination without abnormal findings: Secondary | ICD-10-CM

## 2010-12-05 DIAGNOSIS — I8390 Asymptomatic varicose veins of unspecified lower extremity: Secondary | ICD-10-CM

## 2010-12-05 DIAGNOSIS — Z23 Encounter for immunization: Secondary | ICD-10-CM

## 2010-12-05 DIAGNOSIS — E785 Hyperlipidemia, unspecified: Secondary | ICD-10-CM

## 2010-12-05 MED ORDER — LIRAGLUTIDE 18 MG/3ML ~~LOC~~ SOLN: SUBCUTANEOUS | Status: DC

## 2010-12-05 MED ORDER — METFORMIN HCL 500 MG PO TABS: 500.0000 mg | ORAL_TABLET | Freq: Two times a day (BID) | ORAL | Status: DC

## 2010-12-05 MED ORDER — SIMVASTATIN 40 MG PO TABS: 40.0000 mg | ORAL_TABLET | Freq: Every day | ORAL | Status: DC

## 2010-12-05 NOTE — Progress Notes (Signed)
Subjective:    Patient ID: Troy [name], male    DOB: 11/30/1970, 40 y.o.   MRN: [id number]  HPI Patient comes in today for preventive visit and follow-up of medical issues. Update of her history since her last visit. Since last visit:  Ran out of meds cause he said was told couldn't  get appt til now and didnt ask for refills.  He has been off all his meds except asa for about 3 months..   DM Feels no different but is having some increase urination at  work.   Was on metformin actos onglyza and meal humalog once a day. Some weigh tloss over the year he attributes to diet change feels ok  Gets eye check and ok no unusual infections.  OSA: machine cpap helps a lot now on night shift  Can sleep in day.  LEGS: no more nodule and no ulcers on legs or feet.   Asthma: no  Need for inhalers tends to be a problem when has URI   LIPDIS: off meds no se.   Diarrhea: for years  Had neg colon except polyps  On 3 y recall given pancrealipase  and no help. No different off or on metformin   Review of Systems ROS:  GEN/ HEENTNo fever, significant weight changes sweats headaches vision problems hearing changes, CV/ PULM; No chest pain shortness of breath cough, syncope,edema  change in exercise tolerance. GI /GU: No adominal pain, vomiting, change in bowel habits. No blood in the stool. No significant GU symptoms.  Has  Regular diarrhea.    Since 2004 .   SKIN/HEME: ,no acute skin rashes suspicious lesions or bleeding. No lymphadenopathy, nodules, masses.  NEURO/ PSYCH:  No neurologic signs such as weakness numbness No depression anxiety. IMM/ Allergy: No unusual infections.  Allergy .   REST of 12 system review negative  Past Medical History  Diagnosis Date  . DVT (deep venous thrombosis)   . OSA (obstructive sleep apnea)   . Diabetes insipidus   . Obesity   . Arm weakness     right    Past Surgical History  Procedure Date  . Fibula fracture surgery     plate & pin removed due to  infection 1997  . Ulnar nerve repair   . Elbow surgery   . Cubital tunnel left arm     2003  . Cervical disc surgery     C6/C7 2009    reports that he has never smoked. He does not have any smokeless tobacco history on file. He reports that he does not drink alcohol or use illicit drugs. family history includes Allergies in an unspecified family member; Deep vein thrombosis in an unspecified family member; Heart disease in his mother; Hypertension in an unspecified family member; Obesity in an unspecified family member; Other in his fathers and mother; and Sleep apnea in an unspecified family member. No Known Allergies      Objective:   Physical Exam Physical Exam: Vital signs reviewed BJY:NWGN is a well-developed well-nourished alert cooperative  White male  who appears   stated age in no acute distress.  HEENT: normocephalic  traumatic , Eyes: PERRL EOM's full, conjunctiva clear, Nares: patent no deformity discharge or tenderness., Ears: no deformity EAC's clear TMs with normal landmarks. Mouth: clear OP, no lesions, edema.  Moist mucous membranes. Dentition in adequate repair. NECK: supple without masses, thyromegaly or bruits. CHEST/PULM:  Clear to auscultation and percussion breath sounds equal no wheeze , rales or  rhonchi. No chest wall deformities or tenderness. CV: PMI is nondisplaced, S1 S2 no gallops, murmurs, rubs. Peripheral pulses are full without delay.No JVD .  ABDOMEN: Bowel sounds normal nontender  No guard or rebound, no hepato splenomegal no CVA tenderness.  No hernia. Large abd no fluid wave Extremtities:  No clubbing cyanosis , no acute joint swelling or redness no focal atrophy CHRONIC changes right lower extermity no acute redness or lesions   Healed scars right arm.  Diabetes foot intact rare callus left  No ulcer NV intact NEURO:  Oriented x3, cranial nerves 3-12 appear to be intact, no obvious focal weakness,gait within normal limits no abnormal reflexes or  asymmetrical SKIN: No acute rashes normal turgor, color, no bruising or petechiae. PSYCH: Oriented, good eye contact, no obvious depression anxiety, cognition and judgment appear normal. LN:  No cervical axillary or inguinal adenopathy      Labs reviewed with patient.   Lab Results  Component Value Date   HGBA1C 9.9* 11/28/2010       Assessment & Plan:  Preventive Health Care Counseled regarding healthy nutrition, exercise, sleep, injury prevention, calcium vit d and healthy weight .  Hypertension  Ok today  Diabetes Mellitus   Out of control out of meds    Disc about calling for refills   He didn't ask for these but can in future and can see him earlier than a PV  .    Nevertheless dis trial of victoza and metformin  Side effects disc   And will follow up in 2 months  Information and instruction book given cautions given    Dyslipidemia  Back on statin med no se in past Obesity  Contributing  To health risk  Asthma  Quiescent  OSA   Under cpap rx  Chronic loose stools  Predates all of above. ? Reason  Can try pro biotics also

## 2010-12-05 NOTE — Patient Instructions (Signed)
Begin  victoza  .6  Injectable  [place]  Every day  After 1-2 weeks increase  To 1.2 per day .    Begin the metformin  Also twice a day.  Can try probiotic for your loos stools.  issues . Check sugars  2 x per day and rov in -8 weeks or so.  Restart the statin med also   And stay on the asa.

## 2010-12-19 LAB — CBC
MCHC: 34.4
RBC: 5.21
WBC: 12.1 — ABNORMAL HIGH

## 2010-12-19 LAB — ABO/RH: ABO/RH(D): A POS

## 2010-12-19 LAB — BASIC METABOLIC PANEL
Chloride: 102
GFR calc Af Amer: >60
Potassium: 4.2
Sodium: 136

## 2011-01-31 ENCOUNTER — Other Ambulatory Visit: Payer: BC Managed Care – PPO

## 2011-02-05 ENCOUNTER — Ambulatory Visit: Payer: BC Managed Care – PPO | Admitting: Internal Medicine

## 2011-02-07 ENCOUNTER — Other Ambulatory Visit (INDEPENDENT_AMBULATORY_CARE_PROVIDER_SITE_OTHER): Payer: BC Managed Care – PPO

## 2011-02-07 DIAGNOSIS — E785 Hyperlipidemia, unspecified: Secondary | ICD-10-CM

## 2011-02-07 DIAGNOSIS — E119 Type 2 diabetes mellitus without complications: Secondary | ICD-10-CM

## 2011-02-07 LAB — HEPATIC FUNCTION PANEL
ALT: 39 U/L (ref 0–53)
AST: 27 U/L (ref 0–37)
Bilirubin, Direct: 0.1 mg/dL (ref 0.0–0.3)
Total Bilirubin: 1.1 mg/dL (ref 0.3–1.2)

## 2011-02-07 LAB — LIPID PANEL
Cholesterol: 180 mg/dL (ref 0–200)
Total CHOL/HDL Ratio: 5
VLDL: 55.2 mg/dL — ABNORMAL HIGH (ref 0.0–40.0)

## 2011-02-19 ENCOUNTER — Encounter: Payer: Self-pay | Admitting: Internal Medicine

## 2011-02-21 ENCOUNTER — Ambulatory Visit (INDEPENDENT_AMBULATORY_CARE_PROVIDER_SITE_OTHER): Payer: BC Managed Care – PPO | Admitting: Internal Medicine

## 2011-02-21 ENCOUNTER — Encounter: Payer: Self-pay | Admitting: Internal Medicine

## 2011-02-21 VITALS — BP 120/60 | HR 72 | Wt 341.0 lb

## 2011-02-21 DIAGNOSIS — E119 Type 2 diabetes mellitus without complications: Secondary | ICD-10-CM

## 2011-02-21 DIAGNOSIS — G4730 Sleep apnea, unspecified: Secondary | ICD-10-CM

## 2011-02-21 DIAGNOSIS — G479 Sleep disorder, unspecified: Secondary | ICD-10-CM | POA: Insufficient documentation

## 2011-02-21 DIAGNOSIS — Z9119 Patient's noncompliance with other medical treatment and regimen: Secondary | ICD-10-CM

## 2011-02-21 DIAGNOSIS — E785 Hyperlipidemia, unspecified: Secondary | ICD-10-CM

## 2011-02-21 DIAGNOSIS — G4726 Circadian rhythm sleep disorder, shift work type: Secondary | ICD-10-CM | POA: Insufficient documentation

## 2011-02-21 MED ORDER — METFORMIN HCL ER 500 MG PO TB24: 1000.0000 mg | ORAL_TABLET | Freq: Every day | ORAL | Status: DC

## 2011-02-21 MED ORDER — INSULIN PEN NEEDLE 29G X 12.7MM MISC: Status: DC

## 2011-02-21 NOTE — Patient Instructions (Signed)
Record   Sleep time pattern  And naps and see if this will help   Look for strategies  To help sleep.  Intensify lifestyle interventions.  Change the metformin to xl and take all at one time. To help with adherance

## 2011-02-21 NOTE — Progress Notes (Signed)
  Subjective:    Patient ID: Troy [name], male    DOB: 05/18/1970, 40 y.o.   MRN: [id number]  HPI Patient comes in for followup of multiple medical problems but mostly his diabetes. Since his last visit he has begun to the toes and has been able to take this once a day at one 0.2 without problem. However he is missing many doses of metformin because of his night shift work and irregular schedule. He thinks his blood sugars are coming down more in the 150-170 range instead of over 200 and his nocturia has resolved. No new infections or vision change.  Leg is about the same no redness increasing swelling no foot ulcers.  No change in respiratory. Obstructive sleep apnea still taking the Pap  Recent disturbance in sleep despite her CPAP machine hard to sleep at times does take naps at work sometimes. Has not had a problem with shift work before this has happened since the last 2 months wonders if it is it could be related.  His wife is currently still looking for a job there is some stress but nothing that he thinks is affecting his sleep. He did try a half of his mom's Ambien with good success for that night.  Lipid no change no side effect Review of Systems Negative for chest pain shortness of breath cough vision change thinking Edema.  Past history family history social history reviewed in the electronic medical record.     Objective:   Physical Exam Wt Readings from Last 3 Encounters:  02/21/11 341 lb (154.677 kg)  12/05/10 348 lb (157.852 kg)  12/09/09 349 lb (158.305 kg)   well-developed well-nourished in no acute distress neck without masses chest CTA be a cecal cardiac S1-S2 no gallops or murmurs     Assessment & Plan:  DM  A1c no improvement difficulty with adherence to medication because of shift work and irregular patterns. Consider going up on the big toes though but he is not taking regular metformin so we'll switch to an XR. or where he can take 1000 mg at one time and  then plan increase as tolerated. Consider increasing because if not successful but he should continue lifestyle intervention.  Sleep. He uses his CPAP and does shift work unaware of side effect of medicine doing this problem were discussed strategies calendar over 3-4 weeks to help sleep irregularities he can call if he wants a small amount of Ambien to use as needed but at this point lifestyle changes would be best.   Call in the meantime if needed  Lipids stable continue medication Total visit > 50% spent counseling and coordinating care .

## 2011-03-15 ENCOUNTER — Ambulatory Visit (INDEPENDENT_AMBULATORY_CARE_PROVIDER_SITE_OTHER): Payer: BC Managed Care – PPO | Admitting: Internal Medicine

## 2011-03-15 ENCOUNTER — Encounter: Payer: Self-pay | Admitting: Internal Medicine

## 2011-03-15 VITALS — BP 118/70 | HR 105 | Temp 98.2°F | Wt 344.0 lb

## 2011-03-15 DIAGNOSIS — J22 Unspecified acute lower respiratory infection: Secondary | ICD-10-CM

## 2011-03-15 DIAGNOSIS — E119 Type 2 diabetes mellitus without complications: Secondary | ICD-10-CM

## 2011-03-15 DIAGNOSIS — J45901 Unspecified asthma with (acute) exacerbation: Secondary | ICD-10-CM

## 2011-03-15 DIAGNOSIS — J45909 Unspecified asthma, uncomplicated: Secondary | ICD-10-CM

## 2011-03-15 DIAGNOSIS — J988 Other specified respiratory disorders: Secondary | ICD-10-CM

## 2011-03-15 MED ORDER — ALBUTEROL SULFATE (2.5 MG/3ML) 0.083% IN NEBU
2.5000 mg | INHALATION_SOLUTION | Freq: Once | RESPIRATORY_TRACT | Status: AC
  Administered 2011-03-15: 2.5 mg via RESPIRATORY_TRACT

## 2011-03-15 MED ORDER — AZITHROMYCIN 250 MG PO TABS: 250.0000 mg | ORAL_TABLET | ORAL | Status: AC

## 2011-03-15 MED ORDER — PREDNISONE 20 MG PO TABS: ORAL_TABLET | ORAL | Status: AC

## 2011-03-15 NOTE — Progress Notes (Signed)
  Subjective:    Patient ID: Troy [name], male    DOB: 09-Nov-1970, 41 y.o.   MRN: [id number]  HPI Patient comes in today for SDA  For acute problem evaluation. Sick for a week 8 days ago   Onset with sore throat   Felt getting better until last 2 days and then got worse  Hacking yellow mucous and  And nose area.  Chills last night  .  But no documented fever Mild asthma sx  symibort this am .   garaglin with glass.  Whole family is sick  Niece  Was .  Used Symbicort with some success. Blood sugars have been doing well on Actos and metformin  Review of Systems Negative for chest pain some wheezing had an attack yesterday no nausea vomiting syncope ears congested no pain Past history family history social history reviewed in the electronic medical record.     Objective:   Physical Exam Well-developed well-nourished in no acute distress but very congested and wheezy cough. Looks mildly ill WDWN in NAD  quiet respirations; mildly congested  somewhat hoarse. Non toxic . HEENT: Normocephalic ;atraumatic , Eyes;  PERRL, EOMs  Full, lids and conjunctiva clear,,Ears: no deformities, canals nl, TM landmarks normal, Nose: no deformity or yellow discharge bilaterallyt congested;face non tender Mouth : OP clear without lesion or edema . Mild erythema no edema Neck: Supple without adenopathy or masses or bruits Chest:  Clear to percussion diffuse wheezing and rhonchi expiratory  CV:  S1-S2 no gallops or murmurs peripheral perfusion is normal Skin :nl perfusion and no acute rashes  After nebulizer treatment with albuterol increased air movement and decreased wheezing noted. He also states he feels some better     Assessment & Plan:  Acute asthmatic bronchitis respiratory infection triggered.  Consider atypicals with prolonged symptoms and exposure Treatment prednisone and empiric antibiotic based on length of illness. Discussed continuing Symbicort in followup for alarm symptoms. Monitor  blood sugar as above

## 2011-03-15 NOTE — Patient Instructions (Addendum)
This seems like asthma flare related to a respiratory infection. Asthmatic bronchitis Take a short course of prednisone and antibiotic because of question of bacterial respiratory trigger. Follow blood sugar on the prednisone Symbicort every day for control even after resolved for 2-3 weeks use pro air as needed in the meantime for wheezing.

## 2011-05-16 ENCOUNTER — Other Ambulatory Visit (INDEPENDENT_AMBULATORY_CARE_PROVIDER_SITE_OTHER): Payer: BC Managed Care – PPO

## 2011-05-16 DIAGNOSIS — E119 Type 2 diabetes mellitus without complications: Secondary | ICD-10-CM

## 2011-05-17 LAB — HEMOGLOBIN A1C: Hgb A1c MFr Bld: 9.8 % — ABNORMAL HIGH (ref 4.6–6.5)

## 2011-05-23 ENCOUNTER — Encounter: Payer: Self-pay | Admitting: Internal Medicine

## 2011-05-23 ENCOUNTER — Ambulatory Visit (INDEPENDENT_AMBULATORY_CARE_PROVIDER_SITE_OTHER): Payer: BC Managed Care – PPO | Admitting: Internal Medicine

## 2011-05-23 VITALS — BP 110/70 | HR 111 | Wt 343.0 lb

## 2011-05-23 DIAGNOSIS — J309 Allergic rhinitis, unspecified: Secondary | ICD-10-CM | POA: Insufficient documentation

## 2011-05-23 DIAGNOSIS — E119 Type 2 diabetes mellitus without complications: Secondary | ICD-10-CM

## 2011-05-23 MED ORDER — GLUCOSE BLOOD VI STRP: ORAL_STRIP | Status: DC

## 2011-05-23 MED ORDER — LIRAGLUTIDE 18 MG/3ML ~~LOC~~ SOLN: SUBCUTANEOUS | Status: DC

## 2011-05-23 NOTE — Patient Instructions (Addendum)
Improve dietary  Intake   Will help the diabetes. Much better than any meds .   Begin low dose long acting insulin levimir every  24 hours  .  Begin  With  10 units  Per day   and after 3-5 days increase by 2 units per day  ie 12 for another 3- 5 days   Goal is   Fasting  Is in the 100-120 range.   Check bg readings  At least twice a day for now.  Call readings/ fax /whatever  in  a month.  Plan labs  Hg  a1c and  Ov in 3 months  Otherwise

## 2011-05-23 NOTE — Progress Notes (Signed)
  Subjective:    Patient ID: Troy [name], male    DOB: 1970/05/30, 41 y.o.   MRN: [id number]  HPI comes in today for followup of his diabetes. He needs a refill on his big toes which she is taking regularly at the 120 dose. He denies any significant side effects. He hasn't checked his blood sugar but thinks that he has lost 8 pounds in with out polyuria polydipsia. His vision is better. He isn't limiting his carbs as much as he should he says. He has no hypoglycemia.  In regard to his loose stools that are chronic but he does not think is from the metformin he tried probiotics but this made it worse.  Asthma is stable but he gets a runny nose when he goes to work.  His wife is out of work she was a [employer] certified accountant she is looking for a job   Review of Systems Negative for chest pain shortness of breath falling or bleeding.  Past history family history social history reviewed in the electronic medical record.     Objective:   Physical Exam  Well-developed well-nourished in no acute distress has a runny nose otherwise HEENT [place] Neck: Supple without adenopathy or masses or bruits Chest:  Clear to A&P without wheezes rales or rhonchi CV:  S1-S2 no gallops or murmurs peripheral perfusion is normal Wt Readings from Last 3 Encounters:  05/23/11 343 lb (155.584 kg)  03/15/11 344 lb (156.037 kg)  02/21/11 341 lb (154.677 kg)   Lab Results  Component Value Date   HGBA1C 9.8* 05/16/2011       Assessment & Plan:  Diabetes  still not controlled although his blood sugar today is 150 this is apparently fasting.  Discussed options it is imperative that he implement better lifestyle changes and weight loss.  He was on short-acting insulin in the past we will add basal insulin samples of Levemir given to take 10 units every 24 hours increase 2 units in 3-5 days if his readings are above 120.  He should call or send [name] a copy of his readings and his insulin in about 3-4 weeks. We can adjust  insulin in the meantime as needed and he can raise the level himself. He has been on insulin before.  Call us for a prescription if tolerating well. Check blood sugars at least 2 times a day if not 3.  Plan hemoglobin A1c in 3 months in followup. He may want to try a nonsedating antihistamine before he goes to work to help with his runny nose but he does not think it's problematic.  Loose stools has been evaluated before does not think it's from the metformin ;made worse by probiotics

## 2011-07-13 ENCOUNTER — Telehealth: Payer: Self-pay | Admitting: Internal Medicine

## 2011-07-13 MED ORDER — INSULIN DETEMIR 100 UNIT/ML ~~LOC~~ SOLN: SUBCUTANEOUS | Status: DC

## 2011-07-13 NOTE — Telephone Encounter (Signed)
Pt called and said that Levemir is working well and he would like to req a script for insulin detemir (LEVEMIR FLEXPEN) 100 UNIT/ML injection to [facility] [address] [employer]. Pt is currently taking 24 units 1 time a day.

## 2011-07-13 NOTE — Telephone Encounter (Signed)
[facility] in [facility]. Spoke with pt and he states that he is using Flexpens.

## 2011-08-23 ENCOUNTER — Other Ambulatory Visit (INDEPENDENT_AMBULATORY_CARE_PROVIDER_SITE_OTHER): Payer: BC Managed Care – PPO

## 2011-08-23 DIAGNOSIS — E119 Type 2 diabetes mellitus without complications: Secondary | ICD-10-CM

## 2011-08-30 ENCOUNTER — Ambulatory Visit (INDEPENDENT_AMBULATORY_CARE_PROVIDER_SITE_OTHER): Payer: BC Managed Care – PPO | Admitting: Internal Medicine

## 2011-08-30 ENCOUNTER — Other Ambulatory Visit: Payer: Self-pay | Admitting: Family Medicine

## 2011-08-30 ENCOUNTER — Encounter: Payer: Self-pay | Admitting: Internal Medicine

## 2011-08-30 VITALS — BP 128/86 | HR 95 | Temp 98.4°F | Wt 340.0 lb

## 2011-08-30 DIAGNOSIS — J45909 Unspecified asthma, uncomplicated: Secondary | ICD-10-CM

## 2011-08-30 DIAGNOSIS — E119 Type 2 diabetes mellitus without complications: Secondary | ICD-10-CM | POA: Insufficient documentation

## 2011-08-30 DIAGNOSIS — E785 Hyperlipidemia, unspecified: Secondary | ICD-10-CM

## 2011-08-30 DIAGNOSIS — G4733 Obstructive sleep apnea (adult) (pediatric): Secondary | ICD-10-CM

## 2011-08-30 MED ORDER — INSULIN DETEMIR 100 UNIT/ML ~~LOC~~ SOLN: SUBCUTANEOUS | Status: DC

## 2011-08-30 MED ORDER — METFORMIN HCL ER 500 MG PO TB24: 1000.0000 mg | ORAL_TABLET | Freq: Every day | ORAL | Status: DC

## 2011-08-30 NOTE — Patient Instructions (Addendum)
Need  Better control .   But better since last.  Increase the   levemir  to get  Fasting to 100 - 120  On a regular basis .  Intensify lifestyle interventions.  Can increase metformin to 1500 mg per day

## 2011-08-30 NOTE — Progress Notes (Signed)
  Subjective:    Patient ID: Troy [name], male    DOB: 10-Oct-1970, 41 y.o.   MRN: [id number]  HPI Patient comes in today for follow up of  multiple medical problems.  Diabetes   Last check x 2: 145 and 136  Fasting.  Took about 3 weeks to get levemir to  Goal fbs  24 units  Metformin doesn't really seem to change his Gi sx  Had these well before metformin Had recent   Acute asthmatic bronchitis with fever and wheezing ;" had shot of prednisone and taper . And  Antibiotic for early pna. "  Now had active inhaler and is a lot better still ocass cough. Didn't eat real healthy at that time  Review of Systems Neg cp sob now  And no more fever.  No bleeding no change in leg swelling wears compression hose. No syncope  Says attending to lower carb diet  Still  Past history family history social history reviewed in the electronic medical record. 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     Objective:   Physical Exam BP 150/80  Pulse 95  Temp 98.4 F (36.9 C) (Oral)  Wt 340 lb (154.223 kg)  SpO2 98% WDWN in nad  HEENT  No focal changes  Neck: Supple without adenopathy or masses or bruits Chest:  Clear to A&P without wheezes rales or rhonchi CV:  S1-S2 no gallops or murmurs peripheral perfusion is normal Lab Results  Component Value Date   HGBA1C 8.8* 08/23/2011      Assessment & Plan:  DM  Better not at goal .  Willing to go up on metformin and will increase levimir to get fasting even better goal 100 - 120 and check PP.   Obesity :Continue weight loss albeit slow . OSA under rx  BP reading normal on repeat better reading after sitting  And has been in range previously LIPIDS no se of meds seen . Resolving bronchitis infection triggered   Use b agonist prn  Follow  Keep appt cpx in fall and add a1c

## 2011-09-27 ENCOUNTER — Encounter: Payer: Self-pay | Admitting: Internal Medicine

## 2011-09-27 ENCOUNTER — Other Ambulatory Visit: Payer: Self-pay | Admitting: Internal Medicine

## 2011-09-27 ENCOUNTER — Ambulatory Visit (INDEPENDENT_AMBULATORY_CARE_PROVIDER_SITE_OTHER): Payer: BC Managed Care – PPO | Admitting: Internal Medicine

## 2011-09-27 VITALS — BP 124/82 | HR 120 | Temp 97.6°F | Wt 342.0 lb

## 2011-09-27 DIAGNOSIS — M7581 Other shoulder lesions, right shoulder: Secondary | ICD-10-CM | POA: Insufficient documentation

## 2011-09-27 DIAGNOSIS — M719 Bursopathy, unspecified: Secondary | ICD-10-CM

## 2011-09-27 DIAGNOSIS — M25519 Pain in unspecified shoulder: Secondary | ICD-10-CM

## 2011-09-27 DIAGNOSIS — M25511 Pain in right shoulder: Secondary | ICD-10-CM | POA: Insufficient documentation

## 2011-09-27 DIAGNOSIS — E119 Type 2 diabetes mellitus without complications: Secondary | ICD-10-CM

## 2011-09-27 MED ORDER — NAPROXEN 500 MG PO TABS: 500.0000 mg | ORAL_TABLET | Freq: Two times a day (BID) | ORAL | Status: AC

## 2011-09-27 NOTE — Patient Instructions (Addendum)
This is a shoulder tendinitis  From overuse.  naproxyn 500  Twice a day for 7- 14 days and then as needed.  Ice after activity . Exercises  Avoid overhead rotational  Use unless a advised exercise.  If not a lot better in another  3-4 weeks contact use we may advise pt or sports medicine evaluation.   Shoulder Exercises EXERCISES  RANGE OF MOTION (ROM) AND STRETCHING EXERCISES These exercises may help you when beginning to rehabilitate your injury. Your symptoms may resolve with or without further involvement from your physician, physical therapist or athletic trainer. While completing these exercises, remember:   Restoring tissue flexibility helps normal motion to return to the joints. This allows healthier, less painful movement and activity.   An effective stretch should be held for at least 30 seconds.   A stretch should never be painful. You should only feel a gentle lengthening or release in the stretched tissue.  ROM - Pendulum  Bend at the waist so that your right / left arm falls away from your body. Support yourself with your opposite hand on a solid surface, such as a table or a countertop.   Your right / left arm should be perpendicular to the ground. If it is not perpendicular, you need to lean over farther. Relax the muscles in your right / left arm and shoulder as much as possible.   Gently sway your hips and trunk so they move your right / left arm without any use of your right / left shoulder muscles.   Progress your movements so that your right / left arm moves side to side, then forward and backward, and finally, both clockwise and counterclockwise.   Complete __________ repetitions in each direction. Many people use this exercise to relieve discomfort in their shoulder as well as to gain range of motion.  Repeat __________ times. Complete this exercise __________ times per day. STRETCH - Flexion, Standing  Stand with good posture. With an underhand grip on your  right / left hand and an overhand grip on the opposite hand, grasp a broomstick or cane so that your hands are a little more than shoulder-width apart.   Keeping your right / left elbow straight and shoulder muscles relaxed, push the stick with your opposite hand to raise your right / left arm in front of your body and then overhead. Raise your arm until you feel a stretch in your right / left shoulder, but before you have increased shoulder pain.   Try to avoid shrugging your right / left shoulder as your arm rises by keeping your shoulder blade tucked down and toward your mid-back spine. Hold __________ seconds.   Slowly return to the starting position.  Repeat __________ times. Complete this exercise __________ times per day. STRETCH - Internal Rotation  Place your right / left hand behind your back, palm-up.   Throw a towel or belt over your opposite shoulder. Grasp the towel/belt with your right / left hand.   While keeping an upright posture, gently pull up on the towel/belt until you feel a stretch in the front of your right / left shoulder.   Avoid shrugging your right / left shoulder as your arm rises by keeping your shoulder blade tucked down and toward your mid-back spine.   Hold __________. Release the stretch by lowering your opposite hand.  Repeat __________ times. Complete this exercise __________ times per day. STRETCH - External Rotation and Abduction  Stagger your stance through a doorframe.  It does not matter which foot is forward.   As instructed by your physician, physical therapist or athletic trainer, place your hands:   And forearms above your head and on the door frame.   And forearms at head-height and on the door frame.   At elbow-height and on the door frame.   Keeping your head and chest upright and your stomach muscles tight to prevent over-extending your low-back, slowly shift your weight onto your front foot until you feel a stretch across your chest  and/or in the front of your shoulders.   Hold __________ seconds. Shift your weight to your back foot to release the stretch.  Repeat __________ times. Complete this stretch __________ times per day.  STRENGTHENING EXERCISES  These exercises may help you when beginning to rehabilitate your injury. They may resolve your symptoms with or without further involvement from your physician, physical therapist or athletic trainer. While completing these exercises, remember:   Muscles can gain both the endurance and the strength needed for everyday activities through controlled exercises.   Complete these exercises as instructed by your physician, physical therapist or athletic trainer. Progress the resistance and repetitions only as guided.   You may experience muscle soreness or fatigue, but the pain or discomfort you are trying to eliminate should never worsen during these exercises. If this pain does worsen, stop and make certain you are following the directions exactly. If the pain is still present after adjustments, discontinue the exercise until you can discuss the trouble with your clinician.   If advised by your physician, during your recovery, avoid activity or exercises which involve actions that place your right / left hand or elbow above your head or behind your back or head. These positions stress the tissues which are trying to heal.  STRENGTH - Scapular Depression and Adduction  With good posture, sit on a firm chair. Supported your arms in front of you with pillows, arm rests or a table top. Have your elbows in line with the sides of your body.   Gently draw your shoulder blades down and toward your mid-back spine. Gradually increase the tension without tensing the muscles along the top of your shoulders and the back of your neck.   Hold for __________ seconds. Slowly release the tension and relax your muscles completely before completing the next repetition.   After you have practiced  this exercise, remove the arm support and complete it in standing as well as sitting.  Repeat __________ times. Complete this exercise __________ times per day.  STRENGTH - External Rotators  Secure a rubber exercise band/tubing to a fixed object so that it is at the same height as your right / left elbow when you are standing or sitting on a firm surface.   Stand or sit so that the secured exercise band/tubing is at your side that is not injured.   Bend your elbow 90 degrees. Place a folded towel or small pillow under your right / left arm so that your elbow is a few inches away from your side.   Keeping the tension on the exercise band/tubing, pull it away from your body, as if pivoting on your elbow. Be sure to keep your body steady so that the movement is only coming from your shoulder rotating.   Hold __________ seconds. Release the tension in a controlled manner as you return to the starting position.  Repeat __________ times. Complete this exercise __________ times per day.  STRENGTH - Supraspinatus  Stand or sit with good posture. Grasp a __________ weight or an exercise band/tubing so that your hand is "thumbs-up," like when you shake hands.   Slowly lift your right / left hand from your thigh into the air, traveling about 30 degrees from straight out at your side. Lift your hand to shoulder height or as far as you can without increasing any shoulder pain. Initially, many people do not lift their hands above shoulder height.   Avoid shrugging your right / left shoulder as your arm rises by keeping your shoulder blade tucked down and toward your mid-back spine.   Hold for __________ seconds. Control the descent of your hand as you slowly return to your starting position.  Repeat __________ times. Complete this exercise __________ times per day.  STRENGTH - Shoulder Extensors  Secure a rubber exercise band/tubing so that it is at the height of your shoulders when you are either  standing or sitting on a firm arm-less chair.   With a thumbs-up grip, grasp an end of the band/tubing in each hand. Straighten your elbows and lift your hands straight in front of you at shoulder height. Step back away from the secured end of band/tubing until it becomes tense.   Squeezing your shoulder blades together, pull your hands down to the sides of your thighs. Do not allow your hands to go behind you.   Hold for __________ seconds. Slowly ease the tension on the band/tubing as you reverse the directions and return to the starting position.  Repeat __________ times. Complete this exercise __________ times per day.  STRENGTH - Scapular Retractors  Secure a rubber exercise band/tubing so that it is at the height of your shoulders when you are either standing or sitting on a firm arm-less chair.   With a palm-down grip, grasp an end of the band/tubing in each hand. Straighten your elbows and lift your hands straight in front of you at shoulder height. Step back away from the secured end of band/tubing until it becomes tense.   Squeezing your shoulder blades together, draw your elbows back as you bend them. Keep your upper arm lifted away from your body throughout the exercise.   Hold __________ seconds. Slowly ease the tension on the band/tubing as you reverse the directions and return to the starting position.  Repeat __________ times. Complete this exercise __________ times per day. STRENGTH - Scapular Depressors  Find a sturdy chair without wheels, such as a from a dining room table.   Keeping your feet on the floor, lift your bottom from the seat and lock your elbows.   Keeping your elbows straight, allow gravity to pull your body weight down. Your shoulders will rise toward your ears.   Raise your body against gravity by drawing your shoulder blades down your back, shortening the distance between your shoulders and ears. Although your feet should always maintain contact with the  floor, your feet should progressively support less body weight as you get stronger.   Hold __________ seconds. In a controlled and slow manner, lower your body weight to begin the next repetition.  Repeat __________ times. Complete this exercise __________ times per day.  Document Released: 01/10/2005 Document Revised: 02/15/2011 Document Reviewed: 06/10/2008 [facility] Patient Information 2012 [place], [place].Rotator Cuff Tendonitis  The rotator cuff is the collection of all the muscles and tendons (the supraspinatus, infraspinatus, subscapularis, and teres minor muscles and their tendons) that help your shoulder stay in place. This unit holds the head of the  upper arm bone (humerus) in the cup (fossa) of the shoulder blade (scapula). Basically, it connects the arm to the shoulder. Tendinitis is a swelling and irritation of the tissue, called cord like structures (tendons) that connect muscle to bone. It usually is caused by overusing the joint involved. When the tissue surrounding a tendon (the synovium) becomes inflamed, it is called tenosynovitis. This also is often the result of overuse in people whose jobs require repetitive (over and over again) types of motion. HOME CARE INSTRUCTIONS   Use a sling or splint for as long as directed by your caregiver until the pain decreases.   Apply ice to the injury for 15 to 20 minutes, 3 to 4 times per day. Put the ice in a plastic bag and place a towel between the bag of ice and your skin.   Try to avoid use other than gentle range of motion while your shoulder is painful. Use and exercise only as directed by your caregiver. Stop exercises or range of motion if pain or discomfort increases, unless directed otherwise by your caregiver.   Only take over-the-counter or prescription medicines for pain, discomfort, or fever as directed by your caregiver.   If you were give a shoulder sling and straps (immobilizer), do not remove it except as directed, or until  you see a caregiver for a follow-up examination. If you need to remove it, move your arm as little as possible or as directed.   You may want to sleep on several pillows at night to lessen swelling and pain.  SEEK IMMEDIATE MEDICAL CARE IF:   Pain in your shoulder increases or new pain develops in your arm, hand, or fingers and is not relieved with medications.   You develop new, unexplained symptoms, especially increased numbness in the hands or loss of strength, or you develop any worsening of the problems which brought you in for care.   Your arm, hand, or fingers are numb or tingling.   Your arm, hand, or fingers are swollen, painful, or turn white or blue.  Document Released: 05/19/2003 Document Revised: 02/15/2011 Document Reviewed: 12/25/2007 [facility] Patient Information 2012 [place], [place].

## 2011-09-27 NOTE — Progress Notes (Signed)
  Subjective:    Patient ID: Troy [name], male    DOB: 1970/03/28, 41 y.o.   MRN: [id number]  HPI Patient comes in today for SDA for  new problem evaluation. Onset insidious about 3 weeks ago the day after helping someone move without any specific injury. Increasing over time. Hurts to raise arm past 180 laterally. No new weakness but does have weakness in that arm from previous nerve damage. No associated falling other injuries change in her prescription strength he gets sharp pain when he tried to pull things up over his head.  History this problem before he is right-handed He is tried ibuprofen without significant help may have taken an Aleve here and there.   Review of Systems No fever sugars are pretty good 120-150. Past history family history social history reviewed in the electronic medical record.     Objective:   Physical Exam  BP 124/82  Pulse 120  Temp 97.6 F (36.4 C) (Oral)  Wt 342 lb (155.13 kg)  SpO2 97% Well-developed well-nourished in no acute distress examination of right shoulder no acute swelling or point tenderness discomfort with mostly internal rotation and elevation good range of motion otherwise. Vascular intact. No clavicular pain or a.c. Pain. Neck without masses or bruits      Assessment & Plan:   Shoulder pain. Right Probably rotator cuff tendinitis overuse also is getting worse would have him use ice naproxen 500 twice a day for one to 2 weeks.  Don't think this is radiating pain from his previous cervical disc disease and residual arm symptoms.   Exercise handout given to do range of motion if not help hurting.  If persistent and progressive consider x-ray and or referral.  Discussion about expectant management and plan.

## 2011-11-26 ENCOUNTER — Other Ambulatory Visit (INDEPENDENT_AMBULATORY_CARE_PROVIDER_SITE_OTHER): Payer: BC Managed Care – PPO

## 2011-11-26 DIAGNOSIS — Z0000 Encounter for general adult medical examination without abnormal findings: Secondary | ICD-10-CM

## 2011-11-26 DIAGNOSIS — Z79899 Other long term (current) drug therapy: Secondary | ICD-10-CM

## 2011-11-26 LAB — POCT URINALYSIS DIPSTICK
Nitrite, UA: NEGATIVE
Spec Grav, UA: 1.02
Urobilinogen, UA: 0.2
pH, UA: 5

## 2011-11-26 LAB — HEPATIC FUNCTION PANEL
ALT: 34 U/L (ref 0–53)
AST: 21 U/L (ref 0–37)
Albumin: 3.9 g/dL (ref 3.5–5.2)
Alkaline Phosphatase: 46 U/L (ref 39–117)

## 2011-11-26 LAB — BASIC METABOLIC PANEL
BUN: 12 mg/dL (ref 6–23)
CO2: 26 mEq/L (ref 19–32)
Chloride: 100 mEq/L (ref 96–112)
Creatinine, Ser: 0.8 mg/dL (ref 0.4–1.5)
Glucose, Bld: 154 mg/dL — ABNORMAL HIGH (ref 70–99)

## 2011-11-26 LAB — CBC WITH DIFFERENTIAL/PLATELET
Basophils Absolute: 0.1 10*3/uL (ref 0.0–0.1)
Eosinophils Absolute: 0 10*3/uL (ref 0.0–0.7)
MCHC: 33.3 g/dL (ref 30.0–36.0)
MCV: 95.4 fl (ref 78.0–100.0)
Monocytes Absolute: 0.6 10*3/uL (ref 0.1–1.0)
Neutrophils Relative %: 59 % (ref 43.0–77.0)
Platelets: 234 10*3/uL (ref 150.0–400.0)
RDW: 12.7 % (ref 11.5–14.6)
WBC: 11.3 10*3/uL — ABNORMAL HIGH (ref 4.5–10.5)

## 2011-11-26 LAB — LIPID PANEL
Cholesterol: 184 mg/dL (ref 0–200)
Triglycerides: 348 mg/dL — ABNORMAL HIGH (ref 0.0–149.0)

## 2011-11-26 LAB — LDL CHOLESTEROL, DIRECT: Direct LDL: 104.8 mg/dL

## 2011-11-26 LAB — TSH: TSH: 1.49 u[IU]/mL (ref 0.35–5.50)

## 2011-11-26 LAB — MICROALBUMIN / CREATININE URINE RATIO: Creatinine,U: 174.5 mg/dL

## 2011-12-03 ENCOUNTER — Encounter: Payer: Self-pay | Admitting: Internal Medicine

## 2011-12-03 ENCOUNTER — Ambulatory Visit (INDEPENDENT_AMBULATORY_CARE_PROVIDER_SITE_OTHER): Payer: BC Managed Care – PPO | Admitting: Internal Medicine

## 2011-12-03 VITALS — BP 112/86 | HR 106 | Temp 98.0°F | Ht 70.5 in | Wt 346.0 lb

## 2011-12-03 DIAGNOSIS — Z0000 Encounter for general adult medical examination without abnormal findings: Secondary | ICD-10-CM

## 2011-12-03 DIAGNOSIS — G4730 Sleep apnea, unspecified: Secondary | ICD-10-CM

## 2011-12-03 DIAGNOSIS — E785 Hyperlipidemia, unspecified: Secondary | ICD-10-CM

## 2011-12-03 DIAGNOSIS — M67919 Unspecified disorder of synovium and tendon, unspecified shoulder: Secondary | ICD-10-CM

## 2011-12-03 DIAGNOSIS — J45909 Unspecified asthma, uncomplicated: Secondary | ICD-10-CM

## 2011-12-03 DIAGNOSIS — M25519 Pain in unspecified shoulder: Secondary | ICD-10-CM

## 2011-12-03 DIAGNOSIS — M25511 Pain in right shoulder: Secondary | ICD-10-CM

## 2011-12-03 DIAGNOSIS — K529 Noninfective gastroenteritis and colitis, unspecified: Secondary | ICD-10-CM

## 2011-12-03 DIAGNOSIS — I8390 Asymptomatic varicose veins of unspecified lower extremity: Secondary | ICD-10-CM

## 2011-12-03 DIAGNOSIS — E119 Type 2 diabetes mellitus without complications: Secondary | ICD-10-CM

## 2011-12-03 DIAGNOSIS — M719 Bursopathy, unspecified: Secondary | ICD-10-CM

## 2011-12-03 DIAGNOSIS — Z23 Encounter for immunization: Secondary | ICD-10-CM

## 2011-12-03 DIAGNOSIS — R197 Diarrhea, unspecified: Secondary | ICD-10-CM

## 2011-12-03 MED ORDER — ATORVASTATIN CALCIUM 10 MG PO TABS: 10.0000 mg | ORAL_TABLET | Freq: Every day | ORAL | Status: DC

## 2011-12-03 NOTE — Patient Instructions (Signed)
Intensify lifestyle interventions. If not better in 3 months plan nutrition or diabetes educator consult. Weight loss in a healthy manner should help. Trial lactose free diet for a few weeks to see if helps gi sx. Change to lipitor generic and see if this works better for lipids. Will arrange a consult bout your shoulder predicament. Begin hep a b immuniz.  Plan lipids and hg a1c in 3 months with rov twinrix#2

## 2011-12-03 NOTE — Progress Notes (Signed)
Subjective:    Patient ID: Troy [name], male    DOB: 1971/01/18, 41 y.o.   MRN: [id number]  HPI Patient comes in today for preventive visit and follow-up of medical issues. Update  history since  last visit:   DM on same meds  stuch at 127- 140 range  bg no se of meds  considering to change diet to adjust. At a plateau; no vision change numbness or weakness.   HT controlled  LIPIDS no se of meds  OSA using cpap  Helps alertness  obesity as above  Right shoulder not better still hurts to do various tasks  ( 2-3 months after lifting)   chronic gi disturbance no change  Worse with probiotics   Review of Systems ROS:  GEN/ HEENT: No fever, significant weight changes sweats headaches vision problems hearing changes, CV/ PULM; No chest pain shortness of breath cough, syncope,edema  change in exercise tolerance. GI /GU: No adominal pain, vomiting, . No blood in the stool. No significant GU symptoms. SKIN/HEME: ,no acute skin rashes suspicious lesions or bleeding. No lymphadenopathy, nodules, masses.  NEURO/ PSYCH:  No neurologic signs such as weakness numbness. No depression anxiety. IMM/ Allergy: No unusual infections.  Allergy .   REST of 12 system review negative except as per HPI Past history family history social history reviewed in the electronic medical record.     Objective:   Physical Exam Physical Exam: Vital signs reviewed WUJ:WJXB is a well-developed well-nourished alert cooperative  White male  who appears   stated age in no acute distress.  HEENT: normocephalic  traumatic , Eyes: PERRL EOM's full, conjunctiva clear, Nares: patent no deformity discharge or tenderness., Ears: no deformity EAC's clear TMs with normal landmarks. Mouth: clear OP, no lesions, edema.  Moist mucous membranes. Dentition in adequate repair. NECK: supple without masses, thyromegaly or bruits. CHEST/PULM:  Clear to auscultation and percussion breath sounds equal no wheeze , rales or  rhonchi. No chest wall deformities or tenderness. CV: PMI is nondisplaced, S1 S2 no gallops, murmurs, rubs. Peripheral pulses are full without delay.No JVD .  ABDOMEN: Bowel sounds normal nontender  No guard or rebound, no hepato splenomegal no CVA tenderness.   Extremtities:  No clubbing cyanosis o, no acute joint swelling or redness;;has  atrophy RUE distal no change ne edema shoulder tender rotation   RLE vv no ulcers  NEURO:  Oriented x3, cranial nerves 3-12 appear to be intact, no obvious focal weakness,gait within normal limits no abnormal reflexes or asymmetrical SKIN: No acute rashes normal turgor, color, no bruising or petechiae. PSYCH: Oriented, good eye contact, no obvious depression anxiety, cognition and judgment appear normal. LN:  No cervical axillary or inguinal adenopathy   Lab Results  Component Value Date   WBC 11.3* 11/26/2011   HGB 15.4 11/26/2011   HCT 46.4 11/26/2011   PLT 234.0 11/26/2011   GLUCOSE 154* 11/26/2011   CHOL 184 11/26/2011   TRIG 348.0* 11/26/2011   HDL 31.10* 11/26/2011   LDLDIRECT 104.8 11/26/2011   LDLCALC 107* 07/29/2009   ALT 34 11/26/2011   AST 21 11/26/2011   NA 136 11/26/2011   K 4.1 11/26/2011   CL 100 11/26/2011   CREATININE 0.8 11/26/2011   BUN 12 11/26/2011   CO2 26 11/26/2011   TSH 1.49 11/26/2011   HGBA1C 8.3* 11/26/2011   MICROALBUR 6.3* 11/26/2011       Assessment & Plan:   Preventive Health Care Counseled regarding healthy nutrition, exercise, sleep, injury prevention,  calcium vit d and healthy weight . Flu vaccine today; works nights .    DM better not at goal needs to lose weight in a healhy manner  Consider endocrine  Or referral nutrition or  Diabetes educator Asthma: stable VV: no change Morbid obesity : Lose weight will help medical issues OSA on cpap LIPIds:  Could be better try to change lipitor increase as possible.  Shoulder  Continuing   After strain  Get ortho Sm opninoin Chronic gi disturbance neg for celiac  Try lactose  free diet trial  Plan labs and fu in 3-4 months  Nutritional consult if not better .

## 2011-12-08 ENCOUNTER — Encounter: Payer: Self-pay | Admitting: Internal Medicine

## 2012-02-26 ENCOUNTER — Other Ambulatory Visit: Payer: BC Managed Care – PPO

## 2012-03-04 ENCOUNTER — Ambulatory Visit: Payer: BC Managed Care – PPO | Admitting: Internal Medicine

## 2012-03-14 ENCOUNTER — Other Ambulatory Visit (INDEPENDENT_AMBULATORY_CARE_PROVIDER_SITE_OTHER): Payer: BC Managed Care – PPO

## 2012-03-14 DIAGNOSIS — E785 Hyperlipidemia, unspecified: Secondary | ICD-10-CM

## 2012-03-14 LAB — LIPID PANEL
Cholesterol: 157 mg/dL (ref 0–200)
Total CHOL/HDL Ratio: 5
Triglycerides: 411 mg/dL — ABNORMAL HIGH (ref 0.0–149.0)
VLDL: 82.2 mg/dL — ABNORMAL HIGH (ref 0.0–40.0)

## 2012-03-21 ENCOUNTER — Ambulatory Visit (INDEPENDENT_AMBULATORY_CARE_PROVIDER_SITE_OTHER): Payer: BC Managed Care – PPO | Admitting: Internal Medicine

## 2012-03-21 ENCOUNTER — Encounter: Payer: Self-pay | Admitting: Internal Medicine

## 2012-03-21 VITALS — BP 118/82 | HR 90 | Temp 98.2°F | Wt 347.0 lb

## 2012-03-21 DIAGNOSIS — E1165 Type 2 diabetes mellitus with hyperglycemia: Secondary | ICD-10-CM

## 2012-03-21 DIAGNOSIS — E785 Hyperlipidemia, unspecified: Secondary | ICD-10-CM

## 2012-03-21 DIAGNOSIS — Z23 Encounter for immunization: Secondary | ICD-10-CM

## 2012-03-21 DIAGNOSIS — G4730 Sleep apnea, unspecified: Secondary | ICD-10-CM

## 2012-03-21 NOTE — Progress Notes (Signed)
Chief Complaint  Patient presents with  . Follow-up    [employer] will no longer cover Victoza.  Will need for this to be changed.    HPI: Patient comes in today for follow up of  multiple medical problems.  Since his last visit his job shift has changed again to the days 4 days a week he has had a time but trying to take his injections at the right time. He sometimes has missed them but thinks that recently he is done better. He is taking 34 units of the insulin continuing on his mixed toes are and thousand milligrams of metformin a day. He denies any low blood sugars but his urinary symptoms are better and his sugars are down in the 100s most recently but expected it to be out of control previously.  Denies flare of his asthma or lungs. No new neurologic symptoms he is getting some feeling back in his right hand fingers. He is continuing on his CPAP machine with help  ROS: See pertinent positives and negatives per HPI.  Past Medical History  Diagnosis Date  . DVT (deep venous thrombosis)   . OSA (obstructive sleep apnea)   . Diabetes insipidus   . Obesity   . Arm weakness     right     Family History  Problem Relation Age of Onset  . Heart disease Mother   . Hypertension    . Allergies    . Deep vein thrombosis    . Sleep apnea    . Obesity    . Other Mother     clotting disorders  . Other Father     sudden death/cervical and lumbar disk disease  . Aneurysm Father     In his 85s smoked    History   Social History  . Marital Status: Widowed    Spouse Name: N/A    Number of Children: N/A  . Years of Education: N/A   Social History Main Topics  . Smoking status: Never Smoker   . Smokeless tobacco: None  . Alcohol Use: No  . Drug Use: No  . Sexually Active: None   Other Topics Concern  . None   Social History Narrative   Occupation:  days [employer] 11- 8 am  Now changed job and shift to [employer] through Thursday Married 10 10 10Wife s/p bariatric surgery  ptRegular exercise- yesPt does have childrenJust moved to 4 r house up and down stairsFather died suddenly 2010-03-07Daily caffeine use one a day2 dogs and cat.     Outpatient Encounter Prescriptions as of 03/21/2012  Medication Sig Dispense Refill  . albuterol (PROVENTIL HFA;VENTOLIN HFA) 108 (90 BASE) MCG/ACT inhaler Inhale 2 puffs into the lungs every 6 (six) hours as needed.        [name] aspirin 325 MG tablet Take 325 mg by mouth daily.        [name] atorvastatin (LIPITOR) 10 MG tablet Take 1 tablet (10 mg total) by mouth daily.  90 tablet  1  . glucose blood (FREESTYLE LITE) test strip Check blood sugar tid  300 each  3  . insulin detemir (LEVEMIR FLEXPEN) 100 UNIT/ML injection 24 units per day.  45 mL  3  . Insulin Pen Needle (BD ULTRA-FINE PEN NEEDLES) 29G X 12.7MM MISC Use 1 needle daily  30 each  11  . Liraglutide 18 MG/3ML SOLN 1.2 daily  9 mL  3  . metFORMIN (GLUCOPHAGE-XR) 500 MG 24 hr tablet  TAKE 2 TABLETS EVERY DAY WITH BREAKFAST  60 tablet  5  . naproxen (NAPROSYN) 500 MG tablet Take 1 tablet (500 mg total) by mouth 2 (two) times daily with a meal.  40 tablet  1    EXAM:  BP 118/82  Pulse 90  Temp 98.2 F (36.8 C) (Oral)  Wt 347 lb (157.398 kg)  SpO2 96%  There is no height on file to calculate BMI.   Wt Readings from Last 3 Encounters:  03/21/12 347 lb (157.398 kg)  12/03/11 346 lb (156.945 kg)  09/27/11 342 lb (155.13 kg)     GENERAL: vitals reviewed and listed above, alert, oriented, appears well hydrated and in no acute distress  HEENT: atraumatic, conjunctiva  clear, no obvious abnormalities on inspection of external nose and ears OP : no lesion edema or exudate   NECK: no obvious masses on inspection palpation  CV: HRRR, no clubbing cyanosis  Legs in stockings compression non tender   MS: moves all extremities  Ambulatory   PSYCH: pleasant and cooperative, no obvious depression or anxiety Lab Results  Component Value Date   WBC 11.3* 11/26/2011   HGB 15.4  11/26/2011   HCT 46.4 11/26/2011   PLT 234.0 11/26/2011   GLUCOSE 154* 11/26/2011   CHOL 157 03/14/2012   TRIG 411.0* 03/14/2012   HDL 30.30* 03/14/2012   LDLDIRECT 79.6 03/14/2012   LDLCALC 107* 07/29/2009   ALT 34 11/26/2011   AST 21 11/26/2011   NA 136 11/26/2011   K 4.1 11/26/2011   CL 100 11/26/2011   CREATININE 0.8 11/26/2011   BUN 12 11/26/2011   CO2 26 11/26/2011   TSH 1.49 11/26/2011   HGBA1C 10.4* 03/14/2012   MICROALBUR 6.3* 11/26/2011     ASSESSMENT AND PLAN:  Discussed the following assessment and plan:  1. Type 2 diabetes mellitus not at goal  Ambulatory referral to Endocrinology   Another change in his shift still not controlled in fact worse although he has a plan /advise refer to endocrinology  2. Need for hepatitis A and B vaccination  Hepatitis A hepatitis B combined vaccine IM  3. SLEEP APNEA    4. Morbid obesity  Ambulatory referral to Endocrinology  5. HYPERLIPIDEMIA  Ambulatory referral to Endocrinology   Triglycerides elevated more probably related to poor diabetic control.   Discussed options adding medications immediately release insulin for largest meal that because he has been out of control for a while and recommending referral to endocrinology. Patient agrees is cooperative. He may have to stop the dictators anyway because of his insurance changes. Discussed strategies in the meantime. States his blood sugar was low 100s this morning.  He states he is doing counting carbs but has not lost  significant amount of weight recently tha would be helpful for control . -Patient advised to return or notify health care team  immediately if symptoms worsen or persist or new concerns arise.  Patient Instructions  Will arrange      For  Endocrinology referral . On a Friday.   I think that the  triglycerides may improve  When the  Sugar control is better .    Contact us in mean time if needed. Otherwise ROV in 6 months .   Neta Mends. [name] M.D.

## 2012-03-21 NOTE — Patient Instructions (Signed)
Will arrange      For  Endocrinology referral . On a Friday.   I think that the  triglycerides may improve  When the  Sugar control is better .    Contact us in mean time if needed. Otherwise ROV in 6 months .

## 2012-04-04 ENCOUNTER — Encounter: Payer: Self-pay | Admitting: Internal Medicine

## 2012-04-04 ENCOUNTER — Ambulatory Visit (INDEPENDENT_AMBULATORY_CARE_PROVIDER_SITE_OTHER): Payer: BC Managed Care – PPO | Admitting: Internal Medicine

## 2012-04-04 VITALS — BP 102/72 | HR 71 | Temp 97.8°F | Resp 16 | Ht 71.5 in | Wt 337.0 lb

## 2012-04-04 DIAGNOSIS — E119 Type 2 diabetes mellitus without complications: Secondary | ICD-10-CM

## 2012-04-04 NOTE — Patient Instructions (Addendum)
Please increase Metformin to 2000 mg daily. Please try to split the dose of Levemir: 20 units every 12 hours.  Please return in 1 month with your log.   Some suggestions for healthy foods; - substitute whole grain for white bread or pasta - substitute brown rice for white rice - substitute 90-calorie flat bread pieces for slices of bread when possible - substitute sweet potatoes or yams for white potatoes - substitute humus for margarine - substitute tofu for cheese when possible - substitute almond or rice milk for regular milk (would not drink soy milk daily due to concern for soy estrogen influence on breast cancer risk) - substitute dark chocolate for other sweets when possible - substitute water - can add lemon or orange slices for taste - for diet sodas (artificial sweeteners will trick your body that you can eat sweets without getting calories and will lead you to overeating and weight gain in the long run) - do not skip breakfast or other meals (this will slow down the metabolism and will result in more weight gain over time)  - can try smoothies made from fruit and almond/rice milk in am instead of regular breakfast - can also try old-fashioned (not instant) oatmeal made with almond/rice milk in am - order the dressing on the side when eating salad at a restaurant - eat as little meat as possible - can try juicing, but should not forget that juicing will get rid of the fiber, so would alternate with eating raw veg./fruits or drinking smoothies - use as little oil as possible, even when using olive oil - can dress a salad with a mix of balsamic vinegar and lemon juice, for e.g. - use agave nectar, stevia sugar, or regular sugar rather than artificial sweateners - steam or broil/roast veggies  - snack on veggies/fruit/nuts (unsalted, preferably) when possible, rather than processed foods - reduce or eliminate aspartame in diet (it is in diet sodas, chewing gum, etc) Read the  labels!

## 2012-04-04 NOTE — Progress Notes (Signed)
Subjective:     Patient ID: Troy [name], male   DOB: 1970/03/25, 42 y.o.   MRN: [id number]  HPI Troy [name] is a pleasant 42 year old man referred by his primary care physician, Dr. Fabian Sharp, for management of DM2, insulin-dependent, without complications, uncontrolled.  Pt lost 10 lbs in last week - he has a cold and had fever including last night. He is also coughing and has rhinorrhea.  He has been dx with diabetes 2.5 years ago, at the same time that his mother was diagnosed. His hemoglobin A1c on diagnosis was 7.4%. His latest hemoglobin A1c was: Lab Results  Component Value Date   HGBA1C 10.4* 03/14/2012   He is on: - Levemir 40 units at 8 am - Victoza 1.2 mg daily - Metformin XR 1000 mg a day - he has diarrhea (neg. Colonoscopy) - he has 1 stool after every meal Victoza is not covered by [employer] anymore. He tells me he believes that Byetta is covered, but will call them to see for sure. He still has Victoza for ~2 mo.  He recently changed his shift work from nighttime to daytime (9 am-8 pm) 1.5 months ago and his control got worse initially, but now found an equilibrium. He is usually setting alarms to remind him to take his medicines.  He is "not good with checking sugars" - only checking once a day them for last month, before this not checking at all. He does not bring a log. - am: 225-275 (on Levemir 40 units - he was on 36 units, but his sugars were higher, in the 300s) He never feels "low", never had a sugar <120. He knows the spx from a friend who has DM1 and saw him become symptomatic with hypoglycemia.  Diet: - Breakfast: Eggs, cheese, toast - Lunch: Lean Cuisine microwave meal - Dinner: steamed vegetables, chicken, half cup of rice - Snacks: 2-3 per day, usually beef sticks or peanut butter crackers   He did not have frequent urination and thirst, never blurry vision. Eye appt: 2012 - no DR (note reviewed), next will be next week. No neuropathy. He had DVT (2003)  in Right leg and gets swelling and numbness in this leg. He is wearing pressure stockings since 2005. He has Sleep apnea and is compliant with CPAP, except now that he has a URI. Had the flu vaccine this year. He also had the flu vaccine this season. He had a recent lipid panel checked that showed LDL cholesterol of 79, however, triglycerides are in the 400's. He is on Lipitor 10. Previous triglycerides have also being high.  Past Medical History  Diagnosis Date  . DVT (deep venous thrombosis)   . OSA (obstructive sleep apnea)   . Diabetes insipidus   . Obesity   . Arm weakness     right   . Diabetes mellitus without complication    Past Surgical History  Procedure Date  . Fibula fracture surgery     plate & pin removed due to infection 1997  . Ulnar nerve repair   . Elbow surgery   . Cubital tunnel left arm     2003  . Cervical disc surgery     C6/C7 2009   History   Social History  . Marital Status: Widowed    Spouse Name: N/A    Number of Children: 1 - 67 y/o  . Years of Education: N/A   Occupational History  . Not on file.   Social History Main Topics  .  Smoking status: Never Smoker   . Smokeless tobacco: Not on file  . Alcohol Use: Wine, once a month  . Drug Use: No  . Sexually Active: Yes   Other Topics Concern  . Not on file   Social History Narrative   Occupation:  days [employer] 11- 8 am  Now changed job and shift to [employer] through Thursday Married 10 10 10Wife s/p bariatric surgery ptRegular exercise- yesPt does have childrenJust moved to 4 r house up and down stairsFather died suddenly Feb 21, 2010Daily caffeine use one a day2 dogs and cat.     Current Outpatient Prescriptions on File Prior to Visit  Medication Sig Dispense Refill  . albuterol (PROVENTIL HFA;VENTOLIN HFA) 108 (90 BASE) MCG/ACT inhaler Inhale 2 puffs into the lungs every 6 (six) hours as needed.        [name] aspirin 325 MG tablet Take 325 mg by mouth daily.        [name] atorvastatin  (LIPITOR) 10 MG tablet Take 1 tablet (10 mg total) by mouth daily.  90 tablet  1  . insulin detemir (LEVEMIR) 100 UNIT/ML injection Inject 40 Units into the skin daily. 24 units per day.      . Insulin Pen Needle (BD ULTRA-FINE PEN NEEDLES) 29G X 12.7MM MISC Use 1 needle daily  30 each  11  . Liraglutide 18 MG/3ML SOLN 1.2 daily  9 mL  3  . metFORMIN (GLUCOPHAGE-XR) 500 MG 24 hr tablet TAKE 2 TABLETS EVERY DAY WITH BREAKFAST  60 tablet  5  . naproxen (NAPROSYN) 500 MG tablet Take 1 tablet (500 mg total) by mouth 2 (two) times daily with a meal.  40 tablet  1  . glucose blood (FREESTYLE LITE) test strip Check blood sugar tid  300 each  3   No Known Allergies  Family History  Problem Relation Age of Onset  . Heart disease Mother   . Other Mother     clotting disorders  . Diabetes Mother   . Hypertension    . Allergies    . Deep vein thrombosis    . Sleep apnea    . Obesity    . Other Father     sudden death/cervical and lumbar disk disease  . Aneurysm Father     In his 75s smoked  . Hyperlipidemia Father    Review of Systems Constitutional: no weight gain/+ loss, no fatigue, + fever this week Eyes: no blurry vision, no xerophthalmia ENT: + sore throat, no nodules palpated in throat, no dysphagia/odynophagia usually, no hoarseness Cardiovascular: no CP/SOB/palpitations/+ leg swelling (right, chronic - h/o DVT) Respiratory: + cough/SOB Gastrointestinal: no N/V/+ D (see HPI) /C Musculoskeletal: no muscle/joint aches - just neck Skin: no rashes  - R leg (healed) Neurological: no tremors/numbness/tingling/dizziness; almost no sensation in digits 3,4,5 in right arm after he cut his upper arm in the 1990s and severed his ulnar and median nerves Psychiatric: no depression/anxiety  Objective:   Physical Exam BP 102/72  Pulse 71  Temp 97.8 F (36.6 C) (Oral)  Resp 16  Ht 5' 11.5" (1.816 m)  Wt 337 lb (152.862 kg)  BMI 46.35 kg/m2  SpO2 96% Wt Readings from Last 3 Encounters:    04/04/12 337 lb (152.862 kg)  03/21/12 347 lb (157.398 kg)  12/03/11 346 lb (156.945 kg)   Constitutional: obese, in NAD but congested, runny nose Eyes: PERRLA, EOMI, no exophthalmos ENT: moist mucous membranes, no thyromegaly, no cervical lymphadenopathy Cardiovascular: RRR, No  MRG Respiratory: CTA B Gastrointestinal: abdomen soft, NT, ND, BS+ Musculoskeletal: no deformities, strength intact in all 4 Skin: moist, warm, + stasis dermatitis bilat. Legs Neurological: no tremor with outstretched hands, DTR normal in all 4 Large surgical cuts on upper right arm, healed Assessment:     1. DM2, insulin-dependent, without complications, uncontrolled 2. Hypertriglyceridemia    Plan:     1. We had a long discussion with the patient about the need to lose weight and to control his diet as a way to improve both his diabetes and cholesterol control. He has tried the [employer] in the past with some success, he has also tried the diabetes diet (60 g of carbs with meals and 20 with snacks), but he is not able to lose any more pounds. He did lose some this week however probably mostly due to fluids. He tells me that his wife had gastric bypass surgery and has some complications from it. Is not interested in this for himself. I suggested several ways to improve his diet, and I gave him a handout with these suggestions (please see patient information section). I did emphasize the fact that the Atkins diet is pro inflammatory, and can also increase his cholesterol, so would not suggest it. He would greatly benefit from a planned plant-based diet, but he does not think that he can do that.  - Explained that we'll need to have sugars written down to be able to improve his diabetes medication regimen, given sugar log and advised how to fill it and to bring it at next appt. He is to check his sugars twice a day, in a.m. and before mealtime, rotating check times - given foot care handout and explained the  principles - given instructions for hypoglycemia management "15-15 rule" - For now, I split his Levemir 20 units every 12 hours - Will continue the Victoza at 1.2 mg daily - we can switch to Byetta twice a day in the future (patient left me know) - And, since the patient is interested in this, we'll increase the metformin as tolerated to a target dose of 2000 mg daily (he will add 2 more tablets each over a week). He knows to switch to the previous dose if his diarrhea worsens - No labs for today - I will see him back in a month with his sugar log   2. Hypertriglyceridemia - We briefly discussed about his hypertriglyceridemia, which is a marker of poor metabolic health, and will improve with better control of his diabetes, and weight loss.  - I will refer him to nutrition at next visit, if he agrees

## 2012-05-09 ENCOUNTER — Ambulatory Visit (INDEPENDENT_AMBULATORY_CARE_PROVIDER_SITE_OTHER): Payer: BC Managed Care – PPO | Admitting: Internal Medicine

## 2012-05-09 ENCOUNTER — Encounter: Payer: Self-pay | Admitting: Internal Medicine

## 2012-05-09 VITALS — BP 122/68 | HR 83 | Temp 97.6°F | Resp 16 | Ht 71.5 in | Wt 332.0 lb

## 2012-05-09 DIAGNOSIS — E119 Type 2 diabetes mellitus without complications: Secondary | ICD-10-CM

## 2012-05-09 MED ORDER — EXENATIDE ER 2 MG ~~LOC~~ SUSR: 2.0000 mg | SUBCUTANEOUS | Status: DC

## 2012-05-09 MED ORDER — INSULIN GLARGINE 100 UNIT/ML ~~LOC~~ SOLN: 40.0000 [IU] | Freq: Every day | SUBCUTANEOUS | Status: DC

## 2012-05-09 NOTE — Patient Instructions (Addendum)
-   Stop Victoza - Start Bydureon 2 mg injection once a week (on Saturdays, for e.g.) - Continue Metformin - Stop levemir - Start Lantus 40 units at bedtime - please return in 2 months with your sugar log  KEEP UP THE GOOD WORK!!!  Plant-based diet materials:  - lectures (you tube):  Lequita Asal: "Breaking the Food Seduction"  Doug Lisle: "How to Lose Weight, without Losing Your Mind"  Lucile Crater: "What is Insulin Resistance" [email or web address] - documentaries:  Sup[employer]  [employer]  Forks over [employer], Sick and Nearly Dead  [employer] the [employer] - books:  Lequita Asal: "Program for Reversing Diabetes"  Ferol Luz: "The [place] Study"  Konrad Penta: "Supermarket Vegan" (cookbook)

## 2012-05-09 NOTE — Progress Notes (Signed)
Patient ID: Troy [name], male   DOB: 10/03/1970, 42 y.o.   MRN: [id number] Subjective:     Patient ID: Troy [name], male   DOB: 1970-04-28, 42 y.o.   MRN: [id number]  HPI Troy [name] is a pleasant 42 year old man returning for management of DM2, dx 2012, insulin-dependent, without complications, uncontrolled.  His latest hemoglobin A1c was: Lab Results  Component Value Date   HGBA1C 10.4* 03/14/2012   He is on: - Levemir 20 units bid (we split this from 40 units once a day at last visit) - but often forgets the second dose.  - Victoza 1.2 mg daily - Metformin XR 2000 mg a day - we increased this from 1000 mg daily at last visit. At last visit, he was telling me he had diarrhea and a little worse now - tolerable (neg. Colonoscopy) - he has 1 stool after every meal but had this long before the metformin was added Victoza is not covered by [employer] anymore. Byetta and Bydureon are covered. Also, the insurance is pressing to change Levemir, too. Lantus appears to be covered.  He recently changed his shift work from nighttime to daytime (9 am-8 pm) 1.5 months ago and his control got worse initially, but now improved. He is usually setting alarms to remind him to take his medicines.  Last time we met, he was only checking sugars once a day, before this not checking at all. Whenever he checked, CBGs were: 225-275 (in am). He was telling me he never felt "low", never had a sugar <120. He now brings a log that we reviewed together. He is checking 2x a day, rotating time of checks, as advised. His am sugars are improved lately, 150-160 in general, but an occasional 170 or 190 when he eats a large dinner or forgets the second Levemir dose. Later in the day, his sugars are a little higher, highest 2h after dinner or at bedtime, 160-230.   At last visit, we discussed about the need to lose weight and to control his diet as a way to improve both his diabetes and cholesterol control. He has tried  the [employer] in the past with some success, he has also tried the diabetes diet (60 g of carbs with meals and 20 with snacks), but was not able to lose any more pounds. He tells me that his wife had gastric bypass surgery and has some complications from it. Is not interested in this for himself. I suggested several ways to improve his diet, even to try a plant-based diet.   He did a great job over the last month improving his diet, and he now eats mostly vegetarian bordering vegan diet. Moreover, he convinced his wife to adopt this diet to at least when they eat together at home. He lost 6 pounds since we last saw each other, which is really happy about. He is already knowledgeable about vegan food options available at the local grocery stores. He feels better, and is very happy about the improvement in blood sugars.  No frequent urination and thirst, never blurry vision. No DR.Marland KitchenNo neuropathy. He had DVT (2003) in Right leg and gets swelling and numbness in this leg. He is wearing pressure stockings since 2005. He has Sleep apnea and is compliant with CPAP. He had a recent lipid panel checked that showed LDL cholesterol of 79, however, triglycerides are in the 400's. He is on Lipitor 10. Previous triglycerides have also being high.  I reviewed pt's medications,  allergies, PMH, social hx, family hx and no changes required.  Review of Systems Constitutional: no weight gain/+ loss, no fatigue Eyes: no blurry vision, no xerophthalmia ENT: no sore throat, no nodules palpated in throat, no dysphagia/odynophagia usually, no hoarseness Cardiovascular: no CP/SOB/palpitations/+ leg swelling (right, chronic - h/o DVT) Respiratory: no cough/SOB Gastrointestinal: no N/V/+ D (see HPI) /C Musculoskeletal: no muscle/joint aches - just neck Skin: no rashes  - R leg (healed) Neurological: no tremors/numbness/tingling/dizziness; almost no sensation in digits 3,4,5 in right arm after he cut his upper arm in the 1990s  and severed his ulnar and median nerves Psychiatric: no depression/anxiety  Objective:   Physical Exam BP 122/68  Pulse 83  Temp(Src) 97.6 F (36.4 C) (Oral)  Resp 16  Ht 5' 11.5" (1.816 m)  Wt 332 lb (150.594 kg)  BMI 45.66 kg/m2  SpO2 97% Wt Readings from Last 3 Encounters:  05/09/12 332 lb (150.594 kg)  04/04/12 337 lb (152.862 kg)  03/21/12 347 lb (157.398 kg)   Constitutional: obese, in NAD but congested, runny nose Eyes: PERRLA, EOMI, no exophthalmos ENT: moist mucous membranes, no thyromegaly, no cervical lymphadenopathy Cardiovascular: RRR, No MRG Respiratory: CTA B Gastrointestinal: abdomen soft, NT, ND, BS+ Musculoskeletal: no deformities, strength intact in all 4 Skin: moist, warm, + stasis dermatitis bilat. Legs Neurological: no tremor with outstretched hands, DTR normal in all 4 Large surgical cuts on upper right arm, healed Assessment:     1. DM2, insulin-dependent, without complications, uncontrolled  Plan:     1. Patient greatly improved his diet, he started to lose weight and his sugars are doing much better. He is still not at goal, though with the improvement in his diet, I believe that he is heading towards much improved diabetes control.  - Due to pressure from his insurance, we will need to change the Victoza to either Byetta or Bydureon. Since he is having problems with compliance when he is on more than one injection a day, I prefer Bydureon, which is one injection a week. I don't like the fact that we cannot adjust the dosage, but we can definitely give it a try. I told the patient to call me if his sugars increase and stay high. - We'll also switch from Levemir to Lantus, but would start again a daily dose rather than twice a day dose for better compliance >> Lantus 40 units in HS - We will continue Metformin XR 2000 mg daily - I congratulated the patient for his progress with his diet, which already helped his weight and blood sugars - I will see him  back in 2 months with his log

## 2012-05-30 ENCOUNTER — Telehealth: Payer: Self-pay | Admitting: Internal Medicine

## 2012-05-30 NOTE — Telephone Encounter (Signed)
Please call patient-needs script for meter change. CB# [phone] / Roanna Raider

## 2012-05-30 NOTE — Telephone Encounter (Signed)
Pt called requesting a script for a new meter. Please advise.

## 2012-06-02 ENCOUNTER — Other Ambulatory Visit: Payer: Self-pay | Admitting: *Deleted

## 2012-06-02 MED ORDER — GLUCOSE BLOOD VI STRP: ORAL_STRIP | Status: DC

## 2012-06-02 NOTE — Telephone Encounter (Signed)
[name], can you please enter them and route it to me? I will sign. Thanks.

## 2012-06-02 NOTE — Telephone Encounter (Signed)
I sent it in via e-script.

## 2012-06-02 NOTE — Telephone Encounter (Signed)
Pt needed test strips for his new meter, One Touch Verio IQ. Done. [place]

## 2012-06-02 NOTE — Telephone Encounter (Signed)
[name], can you find out what meter he has? -  I am not sure what brand his insurance will cover.

## 2012-06-20 ENCOUNTER — Telehealth: Payer: Self-pay | Admitting: *Deleted

## 2012-06-20 NOTE — Telephone Encounter (Signed)
Pt called (#[phone]) stating that his bg's have gone up since the change in meds. His numbers are in the 160's. He wants to know if he needs to come in sooner than 5/2, to discuss medication. [place] and will not have his phone on him. Please advise.

## 2012-06-20 NOTE — Telephone Encounter (Signed)
[name], him to increase the Lantus to 45 units, and call me next week if his sugars do not go down.

## 2012-06-20 NOTE — Telephone Encounter (Signed)
Called pt and lvm for him to increase his Lantus to 45 units and to call her next week if his sugars do not go down.

## 2012-07-11 ENCOUNTER — Ambulatory Visit: Payer: BC Managed Care – PPO | Admitting: Internal Medicine

## 2012-07-31 ENCOUNTER — Ambulatory Visit (INDEPENDENT_AMBULATORY_CARE_PROVIDER_SITE_OTHER): Payer: BC Managed Care – PPO | Admitting: Internal Medicine

## 2012-07-31 ENCOUNTER — Encounter: Payer: Self-pay | Admitting: Internal Medicine

## 2012-07-31 VITALS — BP 112/68 | HR 84 | Temp 97.6°F | Resp 10 | Ht 71.25 in | Wt 329.0 lb

## 2012-07-31 DIAGNOSIS — E119 Type 2 diabetes mellitus without complications: Secondary | ICD-10-CM

## 2012-07-31 LAB — MICROALBUMIN / CREATININE URINE RATIO
Microalb Creat Ratio: 2.5 mg/g (ref 0.0–30.0)
Microalb, Ur: 3.8 mg/dL — ABNORMAL HIGH (ref 0.0–1.9)

## 2012-07-31 LAB — HEMOGLOBIN A1C: Hgb A1c MFr Bld: 7.8 % — ABNORMAL HIGH (ref 4.6–6.5)

## 2012-07-31 MED ORDER — V-GO 30 KIT: 1.0000 | PACK | Freq: Every day | Status: DC

## 2012-07-31 MED ORDER — GLUCOSE BLOOD VI STRP: ORAL_STRIP | Status: DC

## 2012-07-31 MED ORDER — INSULIN LISPRO 100 UNIT/ML ~~LOC~~ SOLN: SUBCUTANEOUS | Status: DC

## 2012-07-31 NOTE — Progress Notes (Signed)
Subjective:     Patient ID: Troy [name], male   DOB: Aug 07, 1970, 42 y.o.   MRN: [id number]  HPI Mr. Nazar is a pleasant 42 year old man returning for management of DM2, dx 2012, insulin-dependent, without complications, uncontrolled. Last visit was 3 mo ago.  His latest hemoglobin A1c was: Lab Results  Component Value Date   HGBA1C 10.4* 03/14/2012   He is on: - Lantus 45 units in hs  - Bydureon 2 mg weekly, changed from Victoza at last visit due to insurance coverage - 100 $ for 3 mo - Metformin XR 2000 mg a day - has diarrhea - tolerable (neg. Colonoscopy) - he has 1 stool after every meal but had this long before the metformin was added  He checks his sugars 1-2 x a day: - am:140-190 - predinner and bedtime: higher: 170-250  He tried several diets in the past but he now eats mostly vegetarian bordering vegan diet. He also convinced his wife to adopt this diet to at least when they eat together at home. He lost 22 pounds since January. He feels better, and is very happy about the improvement in blood sugars. Last week, he was in vacation and he ate out a lot, so he gained some of the pounds back.  No frequent urination, thirst, blurry vision. No DR. No neuropathy. He has OSA and is compliant with CPAP. In most recent lipid panel, LDL was 79, however, triglycerides are in the 400's. He is on Lipitor 10. Previous triglycerides have also being high.  I reviewed pt's medications, allergies, PMH, social hx, family hx and no changes required.  Review of Systems Constitutional: no weight gain/loss, no fatigue, no subjective hyperthermia/hypothermia Eyes: no blurry vision, no xerophthalmia ENT: no sore throat, no nodules palpated in throat, no dysphagia/odynophagia, no hoarseness Cardiovascular: no CP/SOB/palpitations/leg swelling Respiratory: no cough/SOB Gastrointestinal: no N/V/D/C Musculoskeletal: no muscle/joint aches Skin: no rashes Neurological: no  tremors/numbness/tingling/dizziness Psychiatric: no depression/anxiety  Objective:   Physical Exam BP 112/68  Pulse 84  Temp(Src) 97.6 F (36.4 C) (Oral)  Resp 10  Ht 5' 11.25" (1.81 m)  Wt 329 lb (149.233 kg)  BMI 45.55 kg/m2  SpO2 97% Wt Readings from Last 3 Encounters:  07/31/12 329 lb (149.233 kg)  05/09/12 332 lb (150.594 kg)  04/04/12 337 lb (152.862 kg)  Lost 22 lbs in last 3 months. Constitutional: obese, in NAD but congested, runny nose Eyes: PERRLA, EOMI, no exophthalmos ENT: moist mucous membranes, no thyromegaly, no cervical lymphadenopathy Cardiovascular: RRR, No MRG Respiratory: CTA B Gastrointestinal: abdomen soft, NT, ND, BS+ Musculoskeletal: no deformities, strength intact in all 4 Skin: moist, warm, + stasis dermatitis bilat. Legs Neurological: no tremor with outstretched hands, DTR normal in all 4 Large surgical cuts on upper right arm, healed Assessment:     1. DM2, insulin-dependent, without complications, uncontrolled  Plan:     1. Patient greatly improved his diet, however he fell off the wagon last week. We reviewed his sugar log together, and his sugars are still not at target in the morning, despite increasing the Lantus from 40 to 45 units. Also, his sugars become higher if he checks them before dinner or at bedtime. He usually has a late dinner, around 10 PM, since his wife comes home around 9 PM. I believe that this is the reason for his high sugars in the morning. Since Bydureon is not helping a lot with this, the next step would be adding a short-acting insulin, at least with dinner.  However, we discussed about the possibility of using the VGo system, the mechanical pump, that uses a continuous infusion of short-acting insulin, and also has the advantage of easy availability of bolusing.  - I gave him a prescription for VGo 30 and can start with bolusing 2 clicks per meal (4 units of insulin 3 times a day a.c.) - I do anticipate that we need to  increase his bolusing with dinner in the near future -  He agrees to try this, especially since the company offers one week free - We will continue the metformin at the current dose - I advised him to check his sugars more often in the week in which we'll try the above system - I already wrote the message to the company representative, which will contact the patient to set up a training session - Will check a hemoglobin A1c and a microalbumin/creatinine ratio today - if he starts the VGo, will probably need to schedule an earlier appointment  D/w Chriss Czar [facility]): pt will have the training session tomorrow am  Office Visit on 07/31/2012  Component Date Value Range Status  . Hemoglobin A1C 07/31/2012 7.8* 4.6 - 6.5 % Final   Glycemic Control Guidelines for People with Diabetes:Non Diabetic:  <6%Goal of Therapy: <7%Additional Action Suggested:  >8%   . Microalb, Ur 07/31/2012 3.8* 0.0 - 1.9 mg/dL Final  . Creatinine,U 16/12/9602 150.5   Final  . Microalb Creat Ratio 07/31/2012 2.5  0.0 - 30.0 mg/g Final   Great improvement in HbA1c! Msg sent.

## 2012-07-31 NOTE — Patient Instructions (Addendum)
Please return in 3 months with your sugar log. Please try the VGo system for a week and see if you like it.  Woody Woodcock Psychologist, forensic) will contact you about training session.

## 2012-08-05 ENCOUNTER — Telehealth: Payer: Self-pay | Admitting: *Deleted

## 2012-08-05 NOTE — Telephone Encounter (Signed)
Excellent

## 2012-08-05 NOTE — Telephone Encounter (Signed)
Troy [name], with [facility] [phone], called to let us know that the Troy [name] is covered for his V-go supplies. Troy [name] has been notified as well as his pharmacy. Please be advised.

## 2012-08-12 ENCOUNTER — Other Ambulatory Visit: Payer: Self-pay | Admitting: *Deleted

## 2012-08-12 MED ORDER — METFORMIN HCL ER 500 MG PO TB24: 1000.0000 mg | ORAL_TABLET | Freq: Two times a day (BID) | ORAL | Status: DC

## 2012-08-12 NOTE — Telephone Encounter (Signed)
Called pt to let him know that I [facility] day supply.

## 2012-09-26 ENCOUNTER — Ambulatory Visit: Payer: BC Managed Care – PPO | Admitting: Internal Medicine

## 2012-09-29 ENCOUNTER — Other Ambulatory Visit: Payer: Self-pay | Admitting: Internal Medicine

## 2012-09-29 ENCOUNTER — Ambulatory Visit (INDEPENDENT_AMBULATORY_CARE_PROVIDER_SITE_OTHER): Payer: BC Managed Care – PPO | Admitting: Internal Medicine

## 2012-09-29 ENCOUNTER — Encounter: Payer: Self-pay | Admitting: Internal Medicine

## 2012-09-29 VITALS — BP 122/84 | HR 92 | Temp 97.7°F | Wt 335.0 lb

## 2012-09-29 DIAGNOSIS — E785 Hyperlipidemia, unspecified: Secondary | ICD-10-CM

## 2012-09-29 DIAGNOSIS — J45909 Unspecified asthma, uncomplicated: Secondary | ICD-10-CM

## 2012-09-29 DIAGNOSIS — Z23 Encounter for immunization: Secondary | ICD-10-CM

## 2012-09-29 DIAGNOSIS — E119 Type 2 diabetes mellitus without complications: Secondary | ICD-10-CM

## 2012-09-29 DIAGNOSIS — J309 Allergic rhinitis, unspecified: Secondary | ICD-10-CM

## 2012-09-29 DIAGNOSIS — E1165 Type 2 diabetes mellitus with hyperglycemia: Secondary | ICD-10-CM

## 2012-09-29 NOTE — Progress Notes (Signed)
Chief Complaint  Patient presents with  . Follow-up    HPI: Patient comes in today for follow up of  multiple medical problems. ( computers down today  ) Since last visit has seen dr Elvera Lennox and dm getting better now on pump and metformin maximum  Vegan diet  Lost weight  Although increase in a week after having to eat meat recently  Vision better no new feet sx. Asthma stable some allergy    No change in le sx using  compression stockings. Checks feet every other day  States has had repeat lpid at work better tg but hdl is down .  Could not view at time of visit  ROS: See pertinent positives and negatives per HPI.  Past Medical History  Diagnosis Date  . DVT (deep venous thrombosis)   . OSA (obstructive sleep apnea)   . Diabetes insipidus   . Obesity   . Arm weakness     right   . Diabetes mellitus without complication     Family History  Problem Relation Age of Onset  . Heart disease Mother   . Other Mother     clotting disorders  . Diabetes Mother   . Hypertension    . Allergies    . Deep vein thrombosis    . Sleep apnea    . Obesity    . Other Father     sudden death/cervical and lumbar disk disease  . Aneurysm Father     In his 41s smoked  . Hyperlipidemia Father     History   Social History  . Marital Status: Widowed    Spouse Name: N/A    Number of Children: N/A  . Years of Education: N/A   Social History Main Topics  . Smoking status: Never Smoker   . Smokeless tobacco: None  . Alcohol Use: No  . Drug Use: No  . Sexually Active: Yes   Other Topics Concern  . None   Social History Narrative   Occupation:  days [employer] 11- 8 am  Now changed job and shift to [employer] through Thursday    Married 10 10 10    Wife s/p bariatric surgery pt   Regular exercise- yes   Pt does have children   Just moved to 4 r house up and down stairs   Father died suddenly 05-23-2008   Daily caffeine use one a day   2 dogs and cat.               Outpatient Encounter Prescriptions as of 09/29/2012  Medication Sig Dispense Refill  . albuterol (PROVENTIL HFA;VENTOLIN HFA) 108 (90 BASE) MCG/ACT inhaler Inhale 2 puffs into the lungs every 6 (six) hours as needed.        [name] aspirin 325 MG tablet Take 325 mg by mouth daily.        [name] atorvastatin (LIPITOR) 10 MG tablet Take 1 tablet (10 mg total) by mouth daily.  90 tablet  1  . glucose blood test strip Use as instructed  100 each  12  . glucose blood test strip Use as instructed  100 each  12  . insulin lispro (HUMALOG) 100 UNIT/ML injection To use with Vgo 30 system  20 mL  11  . Insulin Pen Needle (BD ULTRA-FINE PEN NEEDLES) 29G X 12.7MM MISC Use 1 needle daily  30 each  11  . Insulin Pump Disposable (V-GO 30) KIT 1 each by Does not apply route  daily.  30 kit  3  . metFORMIN (GLUCOPHAGE-XR) 500 MG 24 hr tablet Take 2,000 mg by mouth daily with breakfast.      . [DISCONTINUED] Exenatide (BYDUREON) 2 MG SUSR Inject 2 mg into the skin once a week.  8 each  2  . [DISCONTINUED] metFORMIN (GLUCOPHAGE-XR) 500 MG 24 hr tablet Take 2 tablets (1,000 mg total) by mouth 2 (two) times daily.  360 tablet  1  . [DISCONTINUED] insulin detemir (LEVEMIR) 100 UNIT/ML injection Inject 40 Units into the skin daily. 24 units per day.      . [DISCONTINUED] insulin glargine (LANTUS SOLOSTAR) 100 UNIT/ML injection Inject 40 Units into the skin at bedtime.  5 pen  PRN  . [DISCONTINUED] Liraglutide 18 MG/3ML SOLN 1.2 daily  9 mL  3   No facility-administered encounter medications on file as of 09/29/2012.    EXAM:  BP 122/84  Pulse 92  Temp(Src) 97.7 F (36.5 C)  Wt 335 lb (151.955 kg)  BMI 46.38 kg/m2  Body mass index is 46.38 kg/(m^2).  GENERAL: vitals reviewed and listed above, alert, oriented, appears well hydrated and in no acute distress  HEENT: atraumatic, conjunctiva  clear, no obvious abnormalities on inspection of external nose and ears  NECK: no obvious masses on inspection palpation  No  brutis heard LUNGS: clear to auscultation bilaterally, no wheezes, rales or rhonchi, good air movement  CV: HRRR, no clubbing cyanosis nl cap refill  rle in compression stocking no pain abd sopft no masses noted no pain tenderness MS: moves all extremities  PSYCH: pleasant and cooperative, no obvious depression or anxiety  ASSESSMENT AND PLAN:  Discussed the following assessment and plan:  Need for prophylactic vaccination and inoculation against viral hepatitis - Plan: Hepatitis A hepatitis B combined vaccine IM  ASTHMA  Morbid obesity  HYPERLIPIDEMIA  Type 2 diabetes mellitus not at goal  Allergic rhinitis, cause unspecified  -Patient advised to return or notify health care team  if symptoms worsen or persist or new concerns arise.  Patient Instructions  Continue lifestyle intervention healthy eating and exercise . Will check on [place] vaccine date. Due for labs bmp and tsh poss  Other in September will ask for dr G to get these with the next a1c.    Other wise  CPX in  6-8 months  Or as . needed    Burna Mortimer K. [name] M.D.  Lab Results  Component Value Date   WBC 11.3* 11/26/2011   HGB 15.4 11/26/2011   HCT 46.4 11/26/2011   PLT 234.0 11/26/2011   GLUCOSE 154* 11/26/2011   CHOL 157 03/14/2012   TRIG 411.0* 03/14/2012   HDL 30.30* 03/14/2012   LDLDIRECT 79.6 03/14/2012   LDLCALC 107* 07/29/2009   ALT 34 11/26/2011   AST 21 11/26/2011   NA 136 11/26/2011   K 4.1 11/26/2011   CL 100 11/26/2011   CREATININE 0.8 11/26/2011   BUN 12 11/26/2011   CO2 26 11/26/2011   TSH 1.49 11/26/2011   HGBA1C 7.8* 07/31/2012   MICROALBUR 3.8* 07/31/2012

## 2012-09-29 NOTE — Patient Instructions (Signed)
Continue lifestyle intervention healthy eating and exercise . Will check on [place] vaccine date. Due for labs bmp and tsh poss  Other in September will ask for dr G to get these with the next a1c.    Other wise  CPX in  6-8 months  Or as . needed

## 2012-10-20 ENCOUNTER — Other Ambulatory Visit: Payer: Self-pay | Admitting: *Deleted

## 2012-10-20 MED ORDER — INSULIN LISPRO 100 UNIT/ML ~~LOC~~ SOLN: SUBCUTANEOUS | Status: DC

## 2012-11-06 ENCOUNTER — Ambulatory Visit (INDEPENDENT_AMBULATORY_CARE_PROVIDER_SITE_OTHER): Payer: BC Managed Care – PPO | Admitting: Internal Medicine

## 2012-11-06 ENCOUNTER — Encounter: Payer: Self-pay | Admitting: Internal Medicine

## 2012-11-06 VITALS — BP 110/60 | HR 81 | Temp 97.7°F | Resp 12 | Wt 339.0 lb

## 2012-11-06 DIAGNOSIS — IMO0001 Reserved for inherently not codable concepts without codable children: Secondary | ICD-10-CM

## 2012-11-06 DIAGNOSIS — E1165 Type 2 diabetes mellitus with hyperglycemia: Secondary | ICD-10-CM

## 2012-11-06 NOTE — Patient Instructions (Addendum)
Please return in 3 months. Please continue the vegan diet.  Try to start exercising whenever you can, at least walking 30 min a day. Please stop at the lab.

## 2012-11-06 NOTE — Progress Notes (Signed)
Subjective:     Patient ID: MARCIA LEPERA, male   DOB: [birth year], 42 y.o.   MRN: [id number]  HPI Mr. Boudoin is a pleasant 42 year old man returning for management of DM2, dx 2012, insulin-dependent, without complications, uncontrolled. Last visit was 3 mo ago. At that time, we started a VGo pump - 30. He is very happy with the mechanical pump, although this proved to be a little bit more expensive than expected.  His latest hemoglobin A1c was: Lab Results  Component Value Date   HGBA1C 7.8* 07/31/2012   Prior to the VGo, patient was on: - Lantus 45 units in hs  - Bydureon 2 mg weekly, changed from Victoza at last visit due to insurance coverage - 100 $ for 3 mo - Metformin XR 2000 mg a day - has diarrhea - tolerable (neg. Colonoscopy)   On the VGo: - 30 units Humalog over 24 hours - 6 units of Humalog per meal, and also inject one or 2 clicks (2-4 units) with a snack  He was doing very well on a vegetarian/partly vegan diet in the past, however, in the last 2-1/2 months, he started to eat more meat and did not follow diet. Per his scale at home, he gained 15 pounds. He sugars are also higher. Despite this, he has not gone over 200s, which is not unusual in the past, even on the vegan diet.  He checks his sugars 1-2 x a day, less frequently more recently, does not write them down: - am:140-190 >> 140-150 - predinner: 170-250 >> 120-130 occasionally 140s (has a sinus infection) - bedtime: 170-250 >> 170-180 before he fell off the wagon with the diet - switched back to vegetarian diet 1 week ago, and sugars are better, but he is not taking at bedtime now Verio IQ meter.   No frequent urination, thirst, blurry vision.  Last eye exam was in 05/2012. No DR.  No neuropathy.  He has OSA and is compliant with CPAP.  Previous triglycerides have also being high, even in the 700s, however, per his last Wellness assessment,they have  decreased in the 100s. He is on Lipitor 10.   I reviewed  pt's medications, allergies, PMH, social hx, family hx and no changes required.  Review of Systems Constitutional: + weight gain/loss, no fatigue, no subjective hyperthermia/hypothermia; + congestion, no fever/chills Eyes: no blurry vision, no xerophthalmia ENT: no sore throat, no nodules palpated in throat, no dysphagia/odynophagia, no hoarseness Cardiovascular: no CP/SOB/palpitations/leg swelling Respiratory: no cough/SOB Gastrointestinal: no N/V/D/C Musculoskeletal: no muscle/joint aches Skin: no rashes Neurological: no tremors/numbness/tingling/dizziness  Objective:   Physical Exam BP 110/60  Pulse 81  Temp(Src) 97.7 F (36.5 C) (Oral)  Resp 12  Wt 339 lb (153.769 kg)  BMI 46.94 kg/m2  SpO2 98% Wt Readings from Last 3 Encounters:  11/06/12 339 lb (153.769 kg)  09/29/12 335 lb (151.955 kg)  07/31/12 329 lb (149.233 kg)   Constitutional: obese, in NAD but congested, runny nose, nasonated voice Eyes: PERRLA, EOMI, no exophthalmos ENT: moist mucous membranes, no thyromegaly, no cervical lymphadenopathy Cardiovascular: RRR, No MRG Respiratory: CTA B Gastrointestinal: abdomen soft, NT, ND, BS+ Musculoskeletal: no deformities, strength intact in all 4 Skin: moist, warm, + stasis dermatitis bilat.   Assessment:     1. DM2, insulin-dependent, without complications, uncontrolled  Plan:     1. Patient so great improvement in his diabetes after improving his diet, however he fell off the wagon for the last 3 months. He does not bring  a sugar log, but, per his recall, sugar is still not quite at goal in the morning and are high at bedtime. They are better during the day. His other sugars started to improve off that he switch back to his vegetarian/vegan diet. We'll therefore continue his current VGo 30 system, and I advised him that he can increase the mealtime bolusing from 6-8 units per meal, while continuing his snacktime bolusing, too  - We will continue the metformin at the  current dose - I advised him to check his sugars throughout the day, not necessarily more than twice a day, but to start taking before and 2 hours after a meal, mainly after dinner - Will check a hemoglobin A1c, lipid panel and a microalbumin/creatinine ratio today I do not have his latest lipid results, but, per PCP, will check a cholesterol level at this visit. He is fasting. - return to clinic in 3 months with his sugar log  Office Visit on 11/06/2012  Component Date Value Range Status  . Sodium 11/06/2012 138  135 - 145 mEq/L Final  . Potassium 11/06/2012 4.2  3.5 - 5.1 mEq/L Final  . Chloride 11/06/2012 102  96 - 112 mEq/L Final  . CO2 11/06/2012 26  19 - 32 mEq/L Final  . Glucose, Bld 11/06/2012 180* 70 - 99 mg/dL Final  . BUN 40/98/1191 11  6 - 23 mg/dL Final  . Creatinine, Ser 11/06/2012 0.5  0.4 - 1.5 mg/dL Final  . Calcium 47/82/9562 9.2  8.4 - 10.5 mg/dL Final  . GFR 13/10/6576 180.99  >60.00 mL/min Final  . TSH 11/06/2012 1.29  0.35 - 5.50 uIU/mL Final  . Cholesterol 11/06/2012 188  0 - 200 mg/dL Final   ATP III Classification       Desirable:  < 200 mg/dL               Borderline High:  200 - 239 mg/dL          High:  > = 469 mg/dL  . Triglycerides 11/06/2012 406.0* 0.0 - 149.0 mg/dL Final   Normal:  <629 mg/dLBorderline High:  150 - 199 mg/dLTriglyceride is over 400; calculations on Lipids are invalid.  [name] HDL 11/06/2012 35.60* >39.00 mg/dL Final  . VLDL 52/84/1324 81.2* 0.0 - 40.0 mg/dL Final  . Total CHOL/HDL Ratio 11/06/2012 5   Final                  Men          Women1/2 Average Risk     3.4          3.3Average Risk          5.0          4.42X Average Risk          9.6          7.13X Average Risk          15.0          11.0                      . Hemoglobin A1C 11/06/2012 8.7* 4.6 - 6.5 % Final   Glycemic Control Guidelines for People with Diabetes:Non Diabetic:  <6%Goal of Therapy: <7%Additional Action Suggested:  >8%   . Microalb, Ur 11/06/2012 3.2* 0.0 - 1.9 mg/dL  Final  . Creatinine,U 11/06/2012 196.9   Final  . Microalb Creat Ratio 11/06/2012 1.6  0.0 - 30.0 mg/g Final  .  Direct LDL 11/06/2012 97.8   Final   Optimal:  <100 mg/dLNear or Above Optimal:  100-129 mg/dLBorderline High:  130-159 mg/dLHigh:  160-189 mg/dLVery High:  >190 mg/dL   Msg sent; Dear Mr Blanford, Here are the results of your latest labs: HbA1C is higher, so try to continue with your plant-based diet, as you already started. You do not have proteins in your urine, which is great! Please let me know if you have any questions. Sincerely, Carlus Pavlov MD

## 2012-11-07 LAB — MICROALBUMIN / CREATININE URINE RATIO: Creatinine,U: 196.9 mg/dL

## 2012-11-07 LAB — BASIC METABOLIC PANEL
CO2: 26 mEq/L (ref 19–32)
Chloride: 102 mEq/L (ref 96–112)
Creatinine, Ser: 0.5 mg/dL (ref 0.4–1.5)

## 2012-11-07 LAB — LIPID PANEL
Cholesterol: 188 mg/dL (ref 0–200)
Total CHOL/HDL Ratio: 5
Triglycerides: 406 mg/dL — ABNORMAL HIGH (ref 0.0–149.0)
VLDL: 81.2 mg/dL — ABNORMAL HIGH (ref 0.0–40.0)

## 2012-12-02 ENCOUNTER — Encounter: Payer: Self-pay | Admitting: Family Medicine

## 2012-12-02 ENCOUNTER — Ambulatory Visit (INDEPENDENT_AMBULATORY_CARE_PROVIDER_SITE_OTHER): Payer: BC Managed Care – PPO | Admitting: Family Medicine

## 2012-12-02 ENCOUNTER — Telehealth: Payer: Self-pay | Admitting: Internal Medicine

## 2012-12-02 VITALS — BP 132/80 | Temp 98.0°F | Wt 344.0 lb

## 2012-12-02 DIAGNOSIS — M6200 Separation of muscle (nontraumatic), unspecified site: Secondary | ICD-10-CM

## 2012-12-02 DIAGNOSIS — M6208 Separation of muscle (nontraumatic), other site: Secondary | ICD-10-CM

## 2012-12-02 DIAGNOSIS — E669 Obesity, unspecified: Secondary | ICD-10-CM

## 2012-12-02 NOTE — Progress Notes (Signed)
Chief Complaint  Patient presents with  . Hernia    HPI:  42 yo M pt of Dr. Fabian Sharp here for Acute visit for  1) abd hernia: -noticed last week - notices when does sit up from lying down, reducible, no pain  -no hx of hernia, no hx of surgery on belly -has been exercising more frequently - no heavy lifting or strenuous work -no changes in bowels, blood in stools, etc  ROS: See pertinent positives and negatives per HPI.  Past Medical History  Diagnosis Date  . DVT (deep venous thrombosis)   . OSA (obstructive sleep apnea)   . Diabetes insipidus   . Obesity   . Arm weakness     right   . Diabetes mellitus without complication     Past Surgical History  Procedure Laterality Date  . Fibula fracture surgery      plate & pin removed due to infection 1997  . Ulnar nerve repair    . Elbow surgery    . Cubital tunnel left arm      2003  . Cervical disc surgery      C6/C7 2009    Family History  Problem Relation Age of Onset  . Heart disease Mother   . Other Mother     clotting disorders  . Diabetes Mother   . Hypertension    . Allergies    . Deep vein thrombosis    . Sleep apnea    . Obesity    . Other Father     sudden death/cervical and lumbar disk disease  . Aneurysm Father     In his 39s smoked  . Hyperlipidemia Father     History   Social History  . Marital Status: Widowed    Spouse Name: N/A    Number of Children: N/A  . Years of Education: N/A   Social History Main Topics  . Smoking status: Never Smoker   . Smokeless tobacco: None  . Alcohol Use: No  . Drug Use: No  . Sexual Activity: Yes   Other Topics Concern  . None   Social History Narrative   Occupation:  days [employer] 11- 8 am  Now changed job and shift to [employer] through Thursday    Married 10 10 10    Wife s/p bariatric surgery pt   Regular exercise- yes   Pt does have children   Just moved to 4 r house up and down stairs   Father died suddenly May 27, 2008   Daily  caffeine use one a day   2 dogs and cat.              Current outpatient prescriptions:albuterol (PROVENTIL HFA;VENTOLIN HFA) 108 (90 BASE) MCG/ACT inhaler, Inhale 2 puffs into the lungs every 6 (six) hours as needed.  , Disp: , Rfl: ;  aspirin 325 MG tablet, Take 325 mg by mouth daily.  , Disp: , Rfl: ;  atorvastatin (LIPITOR) 10 MG tablet, Take 1 tablet (10 mg total) by mouth daily., Disp: 90 tablet, Rfl: 1;  glucose blood test strip, Use as instructed, Disp: 100 each, Rfl: 12 glucose blood test strip, Use as instructed, Disp: 100 each, Rfl: 12;  insulin lispro (HUMALOG) 100 UNIT/ML injection, To use with Vgo 30 system, Disp: 60 mL, Rfl: 2;  Insulin Pen Needle (BD ULTRA-FINE PEN NEEDLES) 29G X 12.7MM MISC, Use 1 needle daily, Disp: 30 each, Rfl: 11;  Insulin Pump Disposable (V-GO 30) KIT, 1 each  by Does not apply route daily., Disp: 30 kit, Rfl: 3 metFORMIN (GLUCOPHAGE-XR) 500 MG 24 hr tablet, Take 2,000 mg by mouth daily with breakfast., Disp: , Rfl: ;  naproxen (NAPROSYN) 500 MG tablet, , Disp: , Rfl:   EXAM:  Filed Vitals:   12/02/12 1526  BP: 132/80  Temp: 98 F (36.7 C)    Body mass index is 47.63 kg/(m^2).  GENERAL: vitals reviewed and listed above, alert, oriented, appears well hydrated and in no acute distress  HEENT: atraumatic, conjunttiva clear, no obvious abnormalities on inspection of external nose and ears  NECK: no obvious masses on inspection  ABD: BS+, large diastasis recti, I do not appreciate and actually hernia, NTTP  MS: moves all extremities without noticeable abnormality  PSYCH: pleasant and cooperative, no obvious depression or anxiety  ASSESSMENT AND PLAN:  Discussed the following assessment and plan:  Diastasis recti - Plan: Ambulatory referral to General Surgery  Obesity, unspecified  -pt is very concerned about this and is sure happened all of a sudden in last week and wants referral to surgery - no pain, no change in bowels, no blood in  stools -referral placed, advised no strenuous lifting  -return and emergent precautions given -advised healthy diet, gentle CV exercise and weight loss -Patient advised to return or notify a doctor immediately if symptoms worsen or persist or new concerns arise.  There are no Patient Instructions on file for this visit.   Kriste Basque R.

## 2012-12-02 NOTE — Telephone Encounter (Signed)
Patient Information:  Caller Name: Troy [name]  Phone: [phone]  Patient: Troy [name], Troy [name]  Gender: Male  DOB: 10-09-1970  Age: 42 Years  PCP: Berniece Andreas (Family Practice)  Office Follow Up:  Does the office need to follow up with this patient?: No  Instructions For The Office: N/A  RN Note:  Pt has buging hernia at umbilical area, approx 3x5 in diameter.  Pt denies pain, hernia does not stay in place when pushed back in.  Pt denies injury, heavy lifting or urinary problems. Pt has a desk job. Pt has never been evaluated for this problem.  Symptoms  Reason For Call & Symptoms: Bulging Hernia at [facility] area  Reviewed Health History In EMR: Yes  Reviewed Medications In EMR: Yes  Reviewed Allergies In EMR: Yes  Reviewed Surgeries / Procedures: Yes  Date of Onset of Symptoms: 11/20/2012  Guideline(s) Used:  Abdominal Pain - Male  Disposition Per Guideline:   See Today in Office  Reason For Disposition Reached:   Patient wants to be seen  Advice Given:  N/A  Patient Will Follow Care Advice:  YES  Appointment Scheduled:  12/02/2012 15:15:00 Appointment Scheduled Provider:  Selena Batten (TEXT 1st, after 20 mins can call), [facility]ia Client [facility])

## 2012-12-02 NOTE — Telephone Encounter (Signed)
FYI

## 2012-12-02 NOTE — Telephone Encounter (Signed)
Pt is scheduled for 9/23

## 2012-12-02 NOTE — Telephone Encounter (Signed)
Sounds like can see PCP for next available appt. - doesn't sound urgent. Needs to be seen urgently only if non-reducible, pain, etc.

## 2012-12-03 NOTE — Addendum Note (Signed)
Addended by: Azucena Freed on: 12/03/2012 12:03 PM   Modules accepted: Orders

## 2012-12-11 ENCOUNTER — Other Ambulatory Visit: Payer: Self-pay | Admitting: Internal Medicine

## 2012-12-11 DIAGNOSIS — K469 Unspecified abdominal hernia without obstruction or gangrene: Secondary | ICD-10-CM

## 2012-12-22 ENCOUNTER — Encounter (INDEPENDENT_AMBULATORY_CARE_PROVIDER_SITE_OTHER): Payer: Self-pay | Admitting: Surgery

## 2012-12-22 ENCOUNTER — Ambulatory Visit (INDEPENDENT_AMBULATORY_CARE_PROVIDER_SITE_OTHER): Payer: BC Managed Care – PPO | Admitting: Surgery

## 2012-12-22 VITALS — BP 154/92 | HR 86 | Temp 98.8°F | Resp 16 | Ht 71.75 in | Wt 348.0 lb

## 2012-12-22 DIAGNOSIS — M6208 Separation of muscle (nontraumatic), other site: Secondary | ICD-10-CM | POA: Insufficient documentation

## 2012-12-22 DIAGNOSIS — M6200 Separation of muscle (nontraumatic), unspecified site: Secondary | ICD-10-CM

## 2012-12-22 NOTE — Progress Notes (Signed)
Patient ID: Troy [name], male   DOB: July 01, 1970, 42 y.o.   MRN: [id number]  Chief Complaint  Patient presents with  . New Evaluation    eval abd hernia     HPI Troy [name] is a 42 y.o. male.  Patient sent at request of Dr.[name] for bulge and upper abdomen. The bulges been small until about 3 weeks ago and it became larger. No pain. No nausea or vomiting. The area is not tender. It does not appear to be worse with exertion. HPI  Past Medical History  Diagnosis Date  . DVT (deep venous thrombosis)   . OSA (obstructive sleep apnea)   . Diabetes insipidus   . Obesity   . Arm weakness     right   . Diabetes mellitus without complication     Past Surgical History  Procedure Laterality Date  . Fibula fracture surgery      plate & pin removed due to infection 1997  . Ulnar nerve repair    . Elbow surgery    . Cubital tunnel left arm      2003  . Cervical disc surgery      C6/C7 2009    Family History  Problem Relation Age of Onset  . Heart disease Mother   . Other Mother     clotting disorders  . Diabetes Mother   . Hypertension    . Allergies    . Deep vein thrombosis    . Sleep apnea    . Obesity    . Other Father     sudden death/cervical and lumbar disk disease  . Aneurysm Father     In his 61s smoked  . Hyperlipidemia Father     Social History History  Substance Use Topics  . Smoking status: Never Smoker   . Smokeless tobacco: Not on file  . Alcohol Use: Yes    No Known Allergies  Current Outpatient Prescriptions  Medication Sig Dispense Refill  . aspirin 325 MG tablet Take 325 mg by mouth daily.        Troy [name] glucose blood test strip Use as instructed  100 each  12  . glucose blood test strip Use as instructed  100 each  12  . insulin lispro (HUMALOG) 100 UNIT/ML injection To use with Vgo 30 system  60 mL  2  . Insulin Pen Needle (BD ULTRA-FINE PEN NEEDLES) 29G X 12.7MM MISC Use 1 needle daily  30 each  11  . Insulin Pump Disposable (V-GO 30)  KIT 1 each by Does not apply route daily.  30 kit  3  . metFORMIN (GLUCOPHAGE-XR) 500 MG 24 hr tablet Take 2,000 mg by mouth daily with breakfast.      . albuterol (PROVENTIL HFA;VENTOLIN HFA) 108 (90 BASE) MCG/ACT inhaler Inhale 2 puffs into the lungs every 6 (six) hours as needed.        Troy [name] atorvastatin (LIPITOR) 10 MG tablet Take 1 tablet (10 mg total) by mouth daily.  90 tablet  1  . naproxen (NAPROSYN) 500 MG tablet        No current facility-administered medications for this visit.    Review of Systems Review of Systems  Constitutional: Negative.   HENT: Negative.   Eyes: Negative.   Respiratory: Negative.   Cardiovascular: Negative.   Gastrointestinal: Negative.   Endocrine: Negative.   Genitourinary: Negative.   Allergic/Immunologic: Negative.   Hematological: Negative.   Psychiatric/Behavioral: Negative.     Blood pressure 154/92, pulse  86, temperature 98.8 F (37.1 C), temperature source Temporal, resp. rate 16, height 5' 11.75" (1.822 m), weight 348 lb (157.852 kg).  Physical Exam Physical Exam  Constitutional: He is oriented to person, place, and time. He appears well-developed and well-nourished.  HENT:  Head: Normocephalic and atraumatic.  Eyes: EOM are normal. Pupils are equal, round, and reactive to light.  Neck: Normal range of motion. Neck supple.  Pulmonary/Chest: Effort normal and breath sounds normal.  Abdominal: No hernia.    Obese   Musculoskeletal: Normal range of motion.  Neurological: He is alert and oriented to person, place, and time.  Skin: Skin is warm and dry.  Psychiatric: He has a normal mood and affect. His behavior is normal. Judgment and thought content normal.    Data Reviewed  none Assessment    Diastasis recti DM type 2 Obesity    Plan    No surgery needed. Recommend continued weight loss, more physical exercise and continue control of type 2 diabetes. Warning signs of hernia given. He is at risk of developing this due to  his body habitus, his weight. Return as needed.       Troy [name] A. 12/22/2012, 10:11 AM

## 2012-12-22 NOTE — Patient Instructions (Signed)
Diastasis recti.  No hernia but you are at risk.  Continue weight loss and exercise.

## 2013-02-13 ENCOUNTER — Ambulatory Visit: Payer: BC Managed Care – PPO | Admitting: Internal Medicine

## 2013-03-16 ENCOUNTER — Other Ambulatory Visit (INDEPENDENT_AMBULATORY_CARE_PROVIDER_SITE_OTHER): Payer: BC Managed Care – PPO

## 2013-03-16 DIAGNOSIS — Z0000 Encounter for general adult medical examination without abnormal findings: Secondary | ICD-10-CM

## 2013-03-16 LAB — HEPATIC FUNCTION PANEL
ALK PHOS: 46 U/L (ref 39–117)
ALT: 27 U/L (ref 0–53)
AST: 19 U/L (ref 0–37)
Albumin: 3.8 g/dL (ref 3.5–5.2)
BILIRUBIN DIRECT: 0.1 mg/dL (ref 0.0–0.3)
BILIRUBIN TOTAL: 0.6 mg/dL (ref 0.3–1.2)
Total Protein: 6.9 g/dL (ref 6.0–8.3)

## 2013-03-16 LAB — CBC WITH DIFFERENTIAL/PLATELET
BASOS ABS: 0 10*3/uL (ref 0.0–0.1)
Basophils Relative: 0.5 % (ref 0.0–3.0)
Eosinophils Absolute: 0 10*3/uL (ref 0.0–0.7)
Eosinophils Relative: 0 % (ref 0.0–5.0)
HCT: 42.3 % (ref 39.0–52.0)
Hemoglobin: 14.6 g/dL (ref 13.0–17.0)
LYMPHS PCT: 47.7 % — AB (ref 12.0–46.0)
Lymphs Abs: 4.3 10*3/uL — ABNORMAL HIGH (ref 0.7–4.0)
MCHC: 34.4 g/dL (ref 30.0–36.0)
MCV: 93.3 fl (ref 78.0–100.0)
Monocytes Absolute: 0.5 10*3/uL (ref 0.1–1.0)
Monocytes Relative: 5.4 % (ref 3.0–12.0)
Neutro Abs: 4.2 10*3/uL (ref 1.4–7.7)
Neutrophils Relative %: 46.4 % (ref 43.0–77.0)
Platelets: 203 10*3/uL (ref 150.0–400.0)
RBC: 4.53 Mil/uL (ref 4.22–5.81)
RDW: 13.3 % (ref 11.5–14.6)
WBC: 9 10*3/uL (ref 4.5–10.5)

## 2013-03-16 LAB — BASIC METABOLIC PANEL
BUN: 12 mg/dL (ref 6–23)
CO2: 26 mEq/L (ref 19–32)
Calcium: 8.5 mg/dL (ref 8.4–10.5)
Chloride: 101 mEq/L (ref 96–112)
Creatinine, Ser: 0.8 mg/dL (ref 0.4–1.5)
GFR: 121.04 mL/min (ref >60.00)
Glucose, Bld: 321 mg/dL — ABNORMAL HIGH (ref 70–99)
POTASSIUM: 3.8 meq/L (ref 3.5–5.1)
SODIUM: 135 meq/L (ref 135–145)

## 2013-03-16 LAB — HEMOGLOBIN A1C: Hgb A1c MFr Bld: 11.4 % — ABNORMAL HIGH (ref 4.6–6.5)

## 2013-03-16 LAB — TSH: TSH: 0.51 u[IU]/mL (ref 0.35–5.50)

## 2013-03-16 LAB — LIPID PANEL
Cholesterol: 185 mg/dL (ref 0–200)
HDL: 30.7 mg/dL — AB (ref >39.00)
Total CHOL/HDL Ratio: 6
Triglycerides: 592 mg/dL — ABNORMAL HIGH (ref 0.0–149.0)
VLDL: 118.4 mg/dL — ABNORMAL HIGH (ref 0.0–40.0)

## 2013-03-16 LAB — MICROALBUMIN / CREATININE URINE RATIO
Creatinine,U: 176.5 mg/dL
Microalb Creat Ratio: 2 mg/g (ref 0.0–30.0)
Microalb, Ur: 3.5 mg/dL — ABNORMAL HIGH (ref 0.0–1.9)

## 2013-03-16 LAB — LDL CHOLESTEROL, DIRECT: Direct LDL: 70.5 mg/dL

## 2013-03-20 ENCOUNTER — Ambulatory Visit (INDEPENDENT_AMBULATORY_CARE_PROVIDER_SITE_OTHER): Payer: BC Managed Care – PPO | Admitting: Internal Medicine

## 2013-03-20 ENCOUNTER — Encounter: Payer: Self-pay | Admitting: Internal Medicine

## 2013-03-20 VITALS — BP 122/74 | HR 82 | Temp 97.6°F | Resp 12 | Wt 342.0 lb

## 2013-03-20 DIAGNOSIS — E1165 Type 2 diabetes mellitus with hyperglycemia: Secondary | ICD-10-CM

## 2013-03-20 DIAGNOSIS — IMO0002 Reserved for concepts with insufficient information to code with codable children: Secondary | ICD-10-CM

## 2013-03-20 DIAGNOSIS — IMO0001 Reserved for inherently not codable concepts without codable children: Secondary | ICD-10-CM

## 2013-03-20 DIAGNOSIS — E119 Type 2 diabetes mellitus without complications: Secondary | ICD-10-CM

## 2013-03-20 MED ORDER — INSULIN GLARGINE 100 UNIT/ML SOLOSTAR PEN: 20.0000 [IU] | PEN_INJECTOR | Freq: Every day | SUBCUTANEOUS | Status: DC

## 2013-03-20 NOTE — Patient Instructions (Signed)
Please start Lantus 20 units at bedtime. Continue current doses of the VGo. Please try to get back on the vegan diet. Please return in 1 month with your sugar log.

## 2013-03-20 NOTE — Progress Notes (Signed)
Subjective:     Patient ID: Troy [name], male   DOB: [birth year], 43 y.o.   MRN: [id number]  HPI Troy [name] is a pleasant 43 y.o. man returning for management of DM2, dx 2012, insulin-dependent, without complications, uncontrolled. Last visit was 3 mo ago. He is on a VGo pump - 30.   His latest hemoglobin A1c was: Lab Results  Component Value Date   HGBA1C 11.4* 03/16/2013   HGBA1C 8.7* 11/06/2012   HGBA1C 7.8* 07/31/2012   Prior to the VGo, patient was on: - Lantus 45 units in hs  - Bydureon 2 mg weekly, changed from [place] at last visit due to insurance coverage - 100 $ for 3 mo - Metformin XR 2000 mg a day  On the VGo: - Metformin XR 2000 mg a day - 30 units Humalog over 24 hours - just obtained a 3 mo supply. - 6 units of Humalog per meal, and also inject one or 2 clicks (2-4 units) with a snack - has diarrhea - tolerable (neg. Colonoscopy)   He was doing very well on a vegetarian/partly vegan diet in the past, however, now eating inconsistently due to his erratic work schedule.  He checks his sugars 1-2 x a day, less frequently more recently - had problems with previous meter (Verio IQ). - am:140-190 >> 140-150 >> 240-265 in last week - predinner: 170-250 >> 120-130 >> 250 - bedtime: 170-250 >> 170-180 >> 200-300  Last eye exam was in 05/2012. No DR.  No neuropathy.  He has OSA and is compliant with CPAP.  Previous triglycerides have also being high. He is on Lipitor 10.   I reviewed pt's medications, allergies, PMH, social hx, family hx and no changes required.  Review of Systems Constitutional: no weight gain/loss, no fatigue, no subjective hyperthermia/hypothermia; + congestion, no fever/chills, + nocturia, + increased thirst Eyes: no blurry vision, no xerophthalmia ENT: no sore throat, no nodules palpated in throat, no dysphagia/odynophagia, no hoarseness Cardiovascular: no CP/SOB/palpitations/leg swelling Respiratory: no cough/SOB Gastrointestinal: no  N/V/D/C Musculoskeletal: no muscle/joint aches Skin: no rashes  Objective:   Physical Exam BP 122/74  Pulse 82  Temp(Src) 97.6 F (36.4 C) (Oral)  Resp 12  Wt 342 lb (155.13 kg)  SpO2 97% Wt Readings from Last 3 Encounters:  03/20/13 342 lb (155.13 kg)  12/22/12 348 lb (157.852 kg)  12/02/12 344 lb (156.037 kg)   Constitutional: obese, in NAD but congested, runny nose, nasonated voice Eyes: PERRLA, EOMI, no exophthalmos ENT: moist mucous membranes, no thyromegaly, no cervical lymphadenopathy Cardiovascular: RRR, No MRG Respiratory: CTA B Gastrointestinal: abdomen soft, NT, ND, BS+ Musculoskeletal: no deformities, strength intact in all 4 Skin: moist, warm, + stasis dermatitis bilat.   Assessment:     1. DM2, insulin-dependent, without complications, uncontrolled  Plan:     1. Patient with worsening DM control due to erratic work schedule and relaxed diet. His other sugars greatly improved in the past on a vegetarian/vegan diet, but he could not follow this lately.  - I advised him to: Patient Instructions  Please start Lantus 20 units at bedtime. Continue current doses of the VGo. Please try to get back on the vegan diet. Please return in 1 month with your sugar log.  - strongly encouraged him to restart the vegan diet; and take his meals from home rather than relying on takeout/fast food - I advised him to check his sugars throughout the day - return to clinic in 1 month with his sugar log

## 2013-03-23 ENCOUNTER — Encounter: Payer: Self-pay | Admitting: Internal Medicine

## 2013-03-23 ENCOUNTER — Ambulatory Visit (INDEPENDENT_AMBULATORY_CARE_PROVIDER_SITE_OTHER): Payer: BC Managed Care – PPO | Admitting: Internal Medicine

## 2013-03-23 VITALS — BP 132/90 | HR 81 | Temp 98.0°F | Ht 70.75 in | Wt 342.2 lb

## 2013-03-23 DIAGNOSIS — E119 Type 2 diabetes mellitus without complications: Secondary | ICD-10-CM

## 2013-03-23 DIAGNOSIS — E785 Hyperlipidemia, unspecified: Secondary | ICD-10-CM

## 2013-03-23 DIAGNOSIS — G4733 Obstructive sleep apnea (adult) (pediatric): Secondary | ICD-10-CM

## 2013-03-23 DIAGNOSIS — K649 Unspecified hemorrhoids: Secondary | ICD-10-CM

## 2013-03-23 DIAGNOSIS — Z23 Encounter for immunization: Secondary | ICD-10-CM

## 2013-03-23 DIAGNOSIS — E669 Obesity, unspecified: Secondary | ICD-10-CM

## 2013-03-23 DIAGNOSIS — Z0000 Encounter for general adult medical examination without abnormal findings: Secondary | ICD-10-CM | POA: Insufficient documentation

## 2013-03-23 DIAGNOSIS — I8390 Asymptomatic varicose veins of unspecified lower extremity: Secondary | ICD-10-CM

## 2013-03-23 NOTE — Addendum Note (Signed)
Addended by: Miles Costain T on: 03/23/2013 02:35 PM   Modules accepted: Orders

## 2013-03-23 NOTE — Progress Notes (Signed)
Chief Complaint  Patient presents with  . Annual Exam    HPI: Patient comes in today for Preventive Health Care visit   Dm out of control recently  Went off "vegan diet " beginning lantus and seeing dr G in the month  BP has been in range .  Had cat scratch face in fall rx with antibiotic . No se.  Needs rx for VV compression stockings no ulcers or increasing sx  ,much better since using these but worn out.   Had episode of nausea and dry heave and then hemorrhoid pain with out bleeding over weekend . Was hard to sit now getting better still feels something no change in bowels bleeding etc.   utd on flu vaccinea t work  Eye exam last march   Health Maintenance  Topic Date Due  . Influenza Vaccine  10/10/2013  . Tetanus/tdap  02/12/2017   Health Maintenance Review   ROS:  GEN/ HEENT: No fever, significant weight changes sweats headaches vision problems hearing changes, CV/ PULM; No chest pain shortness of breath cough, syncope,edema  change in exercise tolerance. GI /GU: No adominal pain, , change in bowel habits. No blood in the stool. No significant GU symptoms. SKIN/HEME: ,no acute skin rashes suspicious lesions or bleeding. No lymphadenopathy, nodules, masses.  NEURO/ PSYCH:  No neurologic signs such as weakness numbness. No depression anxiety. IMM/ Allergy: No unusual infections.  Allergy .   REST of 12 system review negative except as per HPI   Past Medical History  Diagnosis Date  . DVT (deep venous thrombosis)   . OSA (obstructive sleep apnea)   . Diabetes insipidus   . Obesity   . Arm weakness     right   . Diabetes mellitus without complication     Family History  Problem Relation Age of Onset  . Heart disease Mother   . Other Mother     clotting disorders  . Diabetes Mother   . Hypertension    . Allergies    . Deep vein thrombosis    . Sleep apnea    . Obesity    . Other Father     sudden death/cervical and lumbar disk disease  . Aneurysm  Father     In his 61s smoked  . Hyperlipidemia Father     History   Social History  . Marital Status: Married    Spouse Name: N/A    Number of Children: N/A  . Years of Education: N/A   Social History Main Topics  . Smoking status: Never Smoker   . Smokeless tobacco: None  . Alcohol Use: Yes  . Drug Use: No  . Sexual Activity: Yes   Other Topics Concern  . None   Social History Narrative   Occupation:  days [name]   Worked 11- 8 am  Now changed job and shift to [employer] through Thursday    Working 10 hours days recently  [employer].    Married '10 10 10   ' Wife s/p bariatric surgery pt   Regular exercise- yes   Pt does have children   Just moved to 4 r house up and down stairs   Father died suddenly May 06, 2008   Daily caffeine use one a day   2 dogs and cat.                 Outpatient Encounter Prescriptions as of 03/23/2013  Medication Sig  . albuterol (PROVENTIL HFA;VENTOLIN HFA) 108 (90 BASE)  MCG/ACT inhaler Inhale 2 puffs into the lungs every 6 (six) hours as needed.    [name] aspirin 325 MG tablet Take 325 mg by mouth daily.    [name] atorvastatin (LIPITOR) 10 MG tablet Take 1 tablet (10 mg total) by mouth daily.  [name] glucose blood test strip Use as instructed  . glucose blood test strip Use as instructed  . Insulin Glargine (LANTUS SOLOSTAR) 100 UNIT/ML Solostar Pen Inject 20 Units into the skin at bedtime.  . insulin lispro (HUMALOG) 100 UNIT/ML injection To use with Vgo 30 system  . Insulin Pen Needle (BD ULTRA-FINE PEN NEEDLES) 29G X 12.7MM MISC Use 1 needle daily  . Insulin Pump Disposable (V-GO 30) KIT 1 each by Does not apply route daily.  . metFORMIN (GLUCOPHAGE-XR) 500 MG 24 hr tablet Take 2,000 mg by mouth daily with breakfast.  . [DISCONTINUED] naproxen (NAPROSYN) 500 MG tablet     EXAM:  BP 132/90  Pulse 81  Temp(Src) 98 F (36.7 C) (Oral)  Ht 5' 10.75" (1.797 m)  Wt 342 lb 3.2 oz (155.221 kg)  BMI 48.07 kg/m2  SpO2 98%  Body mass index is 48.07  kg/(m^2).  Physical Exam: Vital signs reviewed ENI:DPOE is a well-developed well-nourished alert cooperative   male who appears  stated age in no acute distress.  HEENT: normocephalic atraumatic , Eyes: PERRL EOM's full, conjunctiva clear, Nares: paten,t no deformity discharge or tenderness., Ears: no deformity EAC's clear TMs with normal landmarks. Mouth: clear OP, no lesions, edema.  Moist mucous membranes. Dentition in adequate repair. NECK: supple without masses, thyromegaly or bruits. CHEST/PULM:  Clear to auscultation and percussion breath sounds equal no wheeze , rales or rhonchi. No chest wall deformities or tenderness. CV: PMI is nondisplaced, S1 S2 no gallops, murmurs, rubs. Peripheral pulses are full without delay.No JVD .  ABDOMEN: Bowel sounds normal nontender  No guard or rebound, no hepato splenomegal no CVA tenderness.  No hernia. diastasis  Extremtities:  No clubbing cyanosis or edema, no acute joint swelling or redness mild atrophy rue good rom old injury; chonic changes skin color right le some VV no ulcers  Feet no ulcers  NEURO:  Oriented x3, cranial nerves 3-12 appear to be intact, no obvious focal weakness,gait within normal limits no abnormal reflexes or asymmetrical SKIN: No acute rashes normal turgor, color, no bruising or petechiae. PSYCH: Oriented, good eye contact, no obvious depression anxiety, cognition and judgment appear normal. LN: no cervical axillary i adenopathy Anal area small hemorrhoid tag minimal purplish at about 11 o'clock non tender  Lab Results  Component Value Date   WBC 9.0 03/16/2013   HGB 14.6 03/16/2013   HCT 42.3 03/16/2013   PLT 203.0 03/16/2013   GLUCOSE 321* 03/16/2013   CHOL 185 03/16/2013   TRIG 592.0* 03/16/2013   HDL 30.70* 03/16/2013   LDLDIRECT 70.5 03/16/2013   LDLCALC 107* 07/29/2009   ALT 27 03/16/2013   AST 19 03/16/2013   NA 135 03/16/2013   K 3.8 03/16/2013   CL 101 03/16/2013   CREATININE 0.8 03/16/2013   BUN 12 03/16/2013   CO2 26 03/16/2013   TSH  0.51 03/16/2013   HGBA1C 11.4* 03/16/2013   MICROALBUR 3.5* 03/16/2013    ASSESSMENT AND PLAN:  Discussed the following assessment and plan:  Visit for preventive health examination  Other and unspecified hyperlipidemia - tg up with poor glucose control   Type 2 diabetes mellitus not at goal - per dr Darnell Level  disc iLSI  Obesity,  unspecified - weight loss will help metabolic and health  Obstructive sleep apnea (adult) (pediatric)  Varicose vein of leg right - chornmic changes controlled with stockings prevnar advised  Patient Care Team: Burnis Medin, MD as PCP - General Kathee Delton, MD (Pulmonary Disease) Philemon Kingdom, MD as Consulting Physician (Internal Medicine) Patient Instructions   Intensify lifestyle interventions. Lose weight  monitor bp readings to ensure below 140/90  We may need to add ace inhibitor if  Not at goal.  rx for your compression stockings  closed fu with dr Cruzita Lederer for the out of control diabetes. Prevnar 13 today . Hemorrhoid looks like it is resolving recheck as needed .   See Dr. Jonelle Sports for your sleep apnea . Uncertain why you had the dry heaves but if  persistent or progressive follow up .   Preventive visit in 12 months or as needed .  continue diabetes care with endocrinology .  ROV in 3 months if blood pressure not below 140/90  Wt Readings from Last 3 Encounters:  03/23/13 342 lb 3.2 oz (155.221 kg)  03/20/13 342 lb (155.13 kg)  12/22/12 348 lb (157.852 kg)        Mariann Laster K. [name] M.D.  Dr Gardenia Phlegm plan 1 15  1. Patient with worsening DM control due to erratic work schedule and relaxed diet. His other sugars greatly improved in the past on a vegetarian/vegan diet, but he could not follow this lately.  - I advised him to:  Patient Instructions   Please start Lantus 20 units at bedtime.  Continue current doses of the VGo.  Please try to get back on the vegan diet.  Please return in 1 month with your sugar log.  - strongly  encouraged him to restart the vegan diet; and take his meals from home rather than relying on takeout/fast food  - I advised him to check his sugars throughout the day  - return to clinic in 1 month with his sugar log   Pre visit review using our clinic review tool, if applicable. No additional management support is needed unless otherwise documented below in the visit note.

## 2013-03-23 NOTE — Patient Instructions (Addendum)
Intensify lifestyle interventions. Lose weight  monitor bp readings to ensure below 140/90  We may need to add ace inhibitor if  Not at goal.  rx for your compression stockings  closed fu with dr Cruzita Lederer for the out of control diabetes. Prevnar 13 today . Hemorrhoid looks like it is resolving recheck as needed .   See Dr. Jonelle Sports for your sleep apnea . Uncertain why you had the dry heaves but if  persistent or progressive follow up .   Preventive visit in 12 months or as needed .  continue diabetes care with endocrinology .  ROV in 3 months if blood pressure not below 140/90  Wt Readings from Last 3 Encounters:  03/23/13 342 lb 3.2 oz (155.221 kg)  03/20/13 342 lb (155.13 kg)  12/22/12 348 lb (157.852 kg)

## 2013-03-24 ENCOUNTER — Telehealth: Payer: Self-pay

## 2013-03-24 NOTE — Telephone Encounter (Signed)
Relevant patient education assigned to patient using Emmi. ° °

## 2013-03-30 ENCOUNTER — Ambulatory Visit: Payer: BC Managed Care – PPO | Admitting: Internal Medicine

## 2013-04-02 ENCOUNTER — Ambulatory Visit: Payer: BC Managed Care – PPO | Admitting: Internal Medicine

## 2013-04-17 ENCOUNTER — Encounter: Payer: Self-pay | Admitting: Internal Medicine

## 2013-04-17 ENCOUNTER — Ambulatory Visit (INDEPENDENT_AMBULATORY_CARE_PROVIDER_SITE_OTHER): Payer: BC Managed Care – PPO | Admitting: Internal Medicine

## 2013-04-17 VITALS — BP 122/76 | HR 80 | Temp 97.6°F | Resp 12 | Wt 342.8 lb

## 2013-04-17 DIAGNOSIS — E119 Type 2 diabetes mellitus without complications: Secondary | ICD-10-CM

## 2013-04-17 NOTE — Progress Notes (Signed)
  Subjective:     Patient ID: Troy [name], male   DOB: 08/29/1970, 43 y.o.   MRN: [id number]  HPI Troy [name] is a pleasant 43 y.o. man returning for management of DM2, dx 2012, insulin-dependent, without complications, uncontrolled. Last visit was 3 weeks ago. He is on a VGo pump - 30.   His latest hemoglobin A1c was: Lab Results  Component Value Date   HGBA1C 11.4* 03/16/2013   HGBA1C 8.7* 11/06/2012   HGBA1C 7.8* 07/31/2012   Prior to the VGo, patient was on: - Lantus 45 units in hs  - Bydureon 2 mg weekly, changed from [place] at last visit due to insurance coverage - 100 $ for 3 mo - Metformin XR 2000 mg a day  On the VGo: - Metformin XR 2000 mg a day - 30 units Humalog over 24 hours - just obtained a 3 mo supply. - 8 units of Humalog per meal, and also injects one or 4-6 units with a snack - Lantus 20 units >> in am (b/c work schedule). He was doing very well on a vegetarian/partly vegan diet in the past, however, now eating inconsistently due to his erratic work schedule.   He checks his sugars 1-2 x a day: - am:140-190 >> 140-150 >> 240-265 >> 133-207 - before lunch: 129-150 - after lunch: 174 - predinner: 170-250 >> 120-130 >> 250 >> 136-201 - bedtime: 170-250 >> 170-180 >> 200-300 >> 182  He lost 10 lbs since last visit by his scale.   Last eye exam was in 05/2012. No DR. Will see his eye dr on 05/11/2013.  No neuropathy.  He has OSA and is compliant with CPAP.  Previous triglycerides have also being high. He is on Lipitor 10.   I reviewed pt's medications, allergies, PMH, social hx, family hx and no changes required.  Review of Systems Constitutional: no weight gain/loss, no fatigue, no subjective hyperthermia/hypothermia;  Eyes: no blurry vision, no xerophthalmia ENT: no sore throat, no nodules palpated in throat, no dysphagia/odynophagia, no hoarseness Cardiovascular: no CP/SOB/palpitations/leg swelling Respiratory: no cough/SOB Gastrointestinal: no  N/V/D/C Musculoskeletal: no muscle/joint aches Skin: no rashes  Objective:   Physical Exam BP 122/76  Pulse 80  Temp(Src) 97.6 F (36.4 C) (Oral)  Resp 12  Wt 342 lb 12.8 oz (155.493 kg)  SpO2 97% Wt Readings from Last 3 Encounters:  04/17/13 342 lb 12.8 oz (155.493 kg)  03/23/13 342 lb 3.2 oz (155.221 kg)  03/20/13 342 lb (155.13 kg)   Constitutional: obese, in NAD  Eyes: PERRLA, EOMI, no exophthalmos ENT: moist mucous membranes, no thyromegaly, no cervical lymphadenopathy Cardiovascular: RRR, No MRG Respiratory: CTA B Gastrointestinal: abdomen soft, NT, ND, BS+ Musculoskeletal: no deformities, strength intact in all 4 Skin: moist, warm, + stasis dermatitis bilat.   Assessment:     1. DM2, insulin-dependent, without complications, uncontrolled  Plan:     1. Patient with improving DM control with improving diet and re-adding Lantus - I advised him to: Patient Instructions  Continue Lantus 20 units at bedtime. Continue current doses of the VGo. Please try to get back on the vegan diet. Please return in 2 months with your sugar log.   - strongly encouraged him to restart the vegan diet;  - I advised him to check his sugars throughout the day - return to clinic in 2 month with his sugar log

## 2013-04-17 NOTE — Patient Instructions (Signed)
Continue Lantus 20 units at bedtime. Continue current doses of the VGo. Please try to get back on the vegan diet. Please return in 2 months with your sugar log.

## 2013-04-20 ENCOUNTER — Ambulatory Visit (INDEPENDENT_AMBULATORY_CARE_PROVIDER_SITE_OTHER): Payer: BC Managed Care – PPO | Admitting: Pulmonary Disease

## 2013-04-20 ENCOUNTER — Encounter: Payer: Self-pay | Admitting: Pulmonary Disease

## 2013-04-20 VITALS — BP 130/86 | HR 72 | Temp 97.5°F | Ht 71.5 in | Wt 348.8 lb

## 2013-04-20 DIAGNOSIS — G4733 Obstructive sleep apnea (adult) (pediatric): Secondary | ICD-10-CM

## 2013-04-20 DIAGNOSIS — G4730 Sleep apnea, unspecified: Secondary | ICD-10-CM

## 2013-04-20 NOTE — Assessment & Plan Note (Signed)
The patient has a history of moderate obstructive sleep apnea, and has not been seen in 6 years. Luckily, he has done extremely well on his device, and continues to do so. He needs to have his supplies updated, and will also give him a full face mask to use during his times of severe nasal congestion. I've also encouraged him to work aggressively on significant weight loss. He will followup with me in one year if doing well, but is to call if he begins to have issues with his aged CPAP device.

## 2013-04-20 NOTE — Patient Instructions (Signed)
Will send order to get full face mask and supplies Work on weight loss followup with me again in one year if doing well.

## 2013-04-20 NOTE — Progress Notes (Signed)
Subjective:    Patient ID: Troy [name], male    DOB: 10/22/1970, 43 y.o.   MRN: [id number]  HPI The patient is a 43 year old male who I've been asked to see for management of obstructive sleep apnea. He was diagnosed in 2006 with moderate OSA, with an AHI of 28 events per hour. I saw the patient in 2009, where an auto device optimize his pressure to 20 cm according to the patient's history. The patient has been on this since that time, and feels that he is doing well. He feels his machine is not having any issues, and is using nasal pillows. He has kept up with his supplies, and has not had any breakthrough snoring. His only complaint is that of intermittent nasal congestion which makes it difficult to use his pillows. He would like to have a full face mask that he can switch to in the event of this. The patient has rare awakenings during the night, and feels completely rested in the mornings upon arising. He is satisfied with his daytime alertness and has absolutely no sleepiness issues with inactivity. His weight is down 3 pounds from his last visit with me in 2009.   Sleep Questionnaire What time do you typically go to bed?( Between what hours) 12a-2a 12a-2a at 1044 on 04/20/13 by Virl Cagey, CMA How long does it take you to fall asleep? immediately immediately at 1044 on 04/20/13 by Virl Cagey, CMA How many times during the night do you wake up? 0 0 at 1044 on 04/20/13 by Virl Cagey, CMA What time do you get out of bed to start your day? No Value 845-10a at 1044 on 04/20/13 by Virl Cagey, CMA Do you drive or operate heavy machinery in your occupation? No No at 1044 on 04/20/13 by Virl Cagey, CMA How much has your weight changed (up or down) over the past two years? (In pounds) 25 lb (11.34 kg) 25 lb (11.34 kg) at 1044 on 04/20/13 by Virl Cagey, CMA Have you ever had a sleep study before? Yes Yes at 1044 on 04/20/13 by Virl Cagey, CMA If yes,  location of study? [facility] at 1044 on 04/20/13 by Virl Cagey, CMA If yes, date of study? 2005 2005 at 1044 on 04/20/13 by Virl Cagey, CMA Do you currently use CPAP? Yes Yes at 1044 on 04/20/13 by Virl Cagey, CMA If so, what pressure? 20 20 at 1044 on 04/20/13 by Virl Cagey, CMA Do you wear oxygen at any time? No No at 1044 on 04/20/13 by Virl Cagey, CMA   Review of Systems  Constitutional: Negative for fever and unexpected weight change.  HENT: Positive for congestion and sinus pressure. Negative for dental problem, ear pain, nosebleeds, postnasal drip, rhinorrhea, sneezing, sore throat and trouble swallowing.   Eyes: Negative for redness and itching.  Respiratory: Negative for cough, chest tightness, shortness of breath and wheezing.   Cardiovascular: Negative for palpitations and leg swelling.  Gastrointestinal: Negative for nausea and vomiting.  Genitourinary: Negative for dysuria.  Musculoskeletal: Positive for arthralgias and joint swelling.  Skin: Negative for rash.  Neurological: Negative for headaches.  Hematological: Does not bruise/bleed easily.  Psychiatric/Behavioral: Negative for dysphoric mood. The patient is not nervous/anxious.        Objective:   Physical Exam Constitutional:  Obese male, no acute distress  HENT:  Left nare patent without discharge, but septal deviation to left with obstruction.  Oropharynx without exudate, palate and uvula are thick and elongated.   Eyes:  Perrla, eomi, no scleral icterus  Neck:  No JVD, no TMG  Cardiovascular:  Normal rate, regular rhythm, no rubs or gallops.  No murmurs        Intact distal pulses  Pulmonary :  Normal breath sounds, no stridor or respiratory distress   No rales, rhonchi, or wheezing  Abdominal:  Soft, nondistended, bowel sounds present.  No tenderness noted.   Musculoskeletal:  mild lower extremity edema noted.  Lymph Nodes:  No cervical lymphadenopathy noted  Skin:   No cyanosis noted  Neurologic:  Alert, appropriate, moves all 4 extremities without obvious deficit.         Assessment & Plan:

## 2013-04-23 ENCOUNTER — Other Ambulatory Visit: Payer: Self-pay | Admitting: Internal Medicine

## 2013-04-23 ENCOUNTER — Other Ambulatory Visit: Payer: Self-pay | Admitting: *Deleted

## 2013-04-23 MED ORDER — METFORMIN HCL ER 500 MG PO TB24: 2000.0000 mg | ORAL_TABLET | Freq: Every day | ORAL | Status: DC

## 2013-06-15 ENCOUNTER — Ambulatory Visit (INDEPENDENT_AMBULATORY_CARE_PROVIDER_SITE_OTHER): Payer: BC Managed Care – PPO | Admitting: Internal Medicine

## 2013-06-15 ENCOUNTER — Encounter: Payer: Self-pay | Admitting: Internal Medicine

## 2013-06-15 VITALS — BP 120/76 | HR 81 | Temp 97.7°F | Ht 71.5 in | Wt 339.0 lb

## 2013-06-15 DIAGNOSIS — E119 Type 2 diabetes mellitus without complications: Secondary | ICD-10-CM

## 2013-06-15 LAB — HEMOGLOBIN A1C: HEMOGLOBIN A1C: 9.5 % — AB (ref 4.6–6.5)

## 2013-06-15 MED ORDER — INSULIN LISPRO 100 UNIT/ML ~~LOC~~ SOLN: SUBCUTANEOUS | Status: DC

## 2013-06-15 MED ORDER — V-GO 40 KIT: PACK | Status: DC

## 2013-06-15 NOTE — Progress Notes (Signed)
Subjective:     Patient ID: Troy [name], male   DOB: February 02, 1971, 43 y.o.   MRN: [id number]  HPI Troy [name] is a pleasant 43 y.o. man returning for management of DM2, dx 2012, insulin-dependent, without complications, uncontrolled. Last visit was 2 mo ago. He is on a VGo pump - 30.    His latest hemoglobin A1c was markedly increased c/w previous: Lab Results  Component Value Date   HGBA1C 11.4* 03/16/2013   HGBA1C 8.7* 11/06/2012   HGBA1C 7.8* 07/31/2012   He is on: - Metformin XR 2000 mg in am - VGo 30: - 30 units Humalog over 24 hours - just obtained a 3 mo supply. - 8 units of Humalog per meal, and also injects 4-6 units with a snack - we have added Lantus 20 units >> in am (b/c work schedule) as sugars were high and he has a large supply of VGo 30 reservoirs. He ran out of Lantus. He was doing very well on a vegetarian/partly vegan diet in the past, however, now eating inconsistently due to his erratic work schedule.   He checks his sugars 1-2 x a day: - am:140-190 >> 140-150 >> 240-265 >> 133-207 >> 130-160 (ave 140s) - before lunch: 129-150 >> n/c - after lunch: 174 >> n/c - predinner: 170-250 >> 120-130 >> 250 >> 136-201 >> 110-125 - bedtime: 170-250 >> 170-180 >> 200-300 >> 182 >> 140s Highest sugar 220 (x3 since last visit). Lowest sugar 110.  He restarted a mostly vegan meals.   Last eye exam was in 05/2012. Needs to reschedule a new one. No neuropathy.  He has OSA and is compliant with CPAP.  Previous triglycerides have also being high. He is on Lipitor 10.   I reviewed pt's medications, allergies, PMH, social hx, family hx and no changes required.  Review of Systems Constitutional: no weight gain/loss, no fatigue, no subjective hyperthermia/hypothermia;  Eyes: no blurry vision, no xerophthalmia ENT: no sore throat, no nodules palpated in throat, no dysphagia/odynophagia, no hoarseness Cardiovascular: no CP/SOB/palpitations/leg swelling Respiratory: no  cough/SOB Gastrointestinal: no N/V/D/C Musculoskeletal: no muscle/joint aches Skin: no rashes  Objective:   Physical Exam BP 120/76  Pulse 81  Temp(Src) 97.7 F (36.5 C) (Oral)  Ht 5' 11.5" (1.816 m)  Wt 339 lb (153.769 kg)  BMI 46.63 kg/m2  SpO2 97% Wt Readings from Last 3 Encounters:  06/15/13 339 lb (153.769 kg)  04/20/13 348 lb 12.8 oz (158.215 kg)  04/17/13 342 lb 12.8 oz (155.493 kg)   Constitutional: obese, in NAD  Eyes: PERRLA, EOMI, no exophthalmos ENT: moist mucous membranes, no thyromegaly, no cervical lymphadenopathy Cardiovascular: RRR, No MRG Respiratory: CTA B Gastrointestinal: abdomen soft, NT, ND, BS+ Musculoskeletal: no deformities, strength intact in all 4 Skin: moist, warm, + stasis dermatitis bilat.   Assessment:     1. DM2, insulin-dependent, without complications, uncontrolled  Plan:     1. Patient with improving DM control with improving diet. He restarted his diet and lost 9 lbs since 2 mo ago. - he still needs a little more insulin as the VGo 30 can offer. Since he is running out of the [place] 30 reservoirs soon >> will keep off Lantus and switch to VGo40 - I advised him to: Patient Instructions  Please stay on Metformin. Stop Lantus. Switch to VGo 40.  Please stop at the lab.  Please return in 3 months with your sugar log.     - strongly encouraged him to continue the vegan diet;  -  I advised him to check his sugars throughout the day >> bring log at next visit - advised him to reschedule the eye exam - refilled VGo and Humalog - check A1c - return to clinic in 3 month with his sugar log  Office Visit on 06/15/2013  Component Date Value Ref Range Status  . Hemoglobin A1C 06/15/2013 9.5* 4.6 - 6.5 % Final   Glycemic Control Guidelines for People with Diabetes:Non Diabetic:  <6%Goal of Therapy: <7%Additional Action Suggested:  >8%    Improved A1c.

## 2013-06-15 NOTE — Patient Instructions (Signed)
Please stay on Metformin. Stop Lantus. Switch to VGo 40.  Please stop at the lab.  Please return in 3 months with your sugar log.

## 2013-09-14 ENCOUNTER — Ambulatory Visit: Payer: BC Managed Care – PPO | Admitting: Internal Medicine

## 2013-10-05 ENCOUNTER — Other Ambulatory Visit: Payer: Self-pay | Admitting: *Deleted

## 2013-10-05 ENCOUNTER — Telehealth: Payer: Self-pay | Admitting: Internal Medicine

## 2013-10-05 MED ORDER — INSULIN LISPRO 100 UNIT/ML ~~LOC~~ SOLN: SUBCUTANEOUS | Status: DC

## 2013-10-05 NOTE — Telephone Encounter (Signed)
[facility] on [address] in [place] [phone]   Pt needs [name] to call in enough to bridge until appt of the humalog insulin

## 2013-10-05 NOTE — Telephone Encounter (Signed)
Please see below.

## 2013-10-08 ENCOUNTER — Encounter: Payer: Self-pay | Admitting: Internal Medicine

## 2013-10-08 ENCOUNTER — Ambulatory Visit (INDEPENDENT_AMBULATORY_CARE_PROVIDER_SITE_OTHER): Payer: BC Managed Care – PPO | Admitting: Internal Medicine

## 2013-10-08 VITALS — BP 118/78 | HR 86 | Temp 97.8°F | Resp 12 | Wt 338.0 lb

## 2013-10-08 DIAGNOSIS — E119 Type 2 diabetes mellitus without complications: Secondary | ICD-10-CM

## 2013-10-08 LAB — HEMOGLOBIN A1C: Hgb A1c MFr Bld: 10 % — ABNORMAL HIGH (ref 4.6–6.5)

## 2013-10-08 MED ORDER — INSULIN LISPRO 100 UNIT/ML ~~LOC~~ SOLN: SUBCUTANEOUS | Status: DC

## 2013-10-08 NOTE — Patient Instructions (Signed)
Please continue the VGo 40 and the Metformin XR 2000 mg in am. Increase mealtime bolusing to 8 units with a regular meal, 10 with a large meal. Increase snack bolus to 4-6 units depending on the carb content.  Please stop at the lab.  Please come back for a follow-up appointment in 3 months with your sugar log.

## 2013-10-08 NOTE — Progress Notes (Signed)
Subjective:     Patient ID: Troy [name], male   DOB: 09-01-1970, 43 y.o.   MRN: [id number]  HPI Troy [name] is a pleasant 43 y.o. man returning for management of DM2, dx 2012, insulin-dependent, without complications, uncontrolled. Last visit was 3.5 mo ago.   His latest hemoglobin A1c levels were:  Lab Results  Component Value Date   HGBA1C 9.5* 06/15/2013   HGBA1C 11.4* 03/16/2013   HGBA1C 8.7* 11/06/2012   He is on: - Metformin XR 2000 mg in am - VGo 40: - 40 units Humalog over 24 hours  - 4-5 units of Humalog per meal, and also injects 2-3 units with a snack (he decreased this from 8 units with a meal and 4-6 units with a snack!) We have added Lantus 20 units before last visit >> in am (b/c work schedule) as sugars were high and he had a large supply of VGo 30 reservoirs. Now off Lantus.  He checks his sugars 1-2 x a day: - am:140-190 >> 140-150 >> 240-265 >> 133-207 >> 130-160 (ave 140s) >> 120-152, usually 140-144 - before lunch: 129-150 >> n/c  - after lunch: 174 >> n/c - predinner: 170-250 >> 120-130 >> 250 >> 136-201 >> 110-125 >> 130-160-188 - bedtime: 170-250 >> 170-180 >> 200-300 >> 182 >> 140s >> (4-5 hours after dinner) 144-155 Highest sugar 209 - this am as he has run out of insulin in the VGo and has been w/o it for 3 days. No lows.  He is eating vegetarian (mostly vegan) meals.   - No CKD: Lab Results  Component Value Date   BUN 12 03/16/2013   BUN 11 11/06/2012   CREATININE 0.8 03/16/2013   CREATININE 0.5 11/06/2012  Not on ACEI. Last ACR was normal: 2.0 on 03/16/2013. - Has HTG-emia: Lab Results  Component Value Date   CHOL 185 03/16/2013   HDL 30.70* 03/16/2013   LDLCALC 107* 07/29/2009   LDLDIRECT 70.5 03/16/2013   TRIG 592.0* 03/16/2013   CHOLHDL 6 03/16/2013  He is on Lipitor 10.  - Last eye exam was in 05/2012. Needs to reschedule a new one. - No neuropathy.  - He has OSA and is compliant with CPAP.   I reviewed pt's medications, allergies, PMH, social hx,  family hx and no changes required.  Review of Systems Constitutional: no weight gain/loss, no fatigue, no subjective hyperthermia/hypothermia;  Eyes: no blurry vision, no xerophthalmia ENT: no sore throat, no nodules palpated in throat, no dysphagia/odynophagia, no hoarseness Cardiovascular: no CP/SOB/palpitations/leg swelling Respiratory: no cough/SOB Gastrointestinal: no N/V/D/C Musculoskeletal: no muscle/joint aches Skin: no rashes  Objective:   Physical Exam BP 118/78  Pulse 86  Temp(Src) 97.8 F (36.6 C) (Oral)  Resp 12  Wt 338 lb (153.316 kg)  SpO2 97% Wt Readings from Last 3 Encounters:  10/08/13 338 lb (153.316 kg)  06/15/13 339 lb (153.769 kg)  04/20/13 348 lb 12.8 oz (158.215 kg)   Constitutional: obese, in NAD  Eyes: PERRLA, EOMI, no exophthalmos ENT: moist mucous membranes, no thyromegaly, no cervical lymphadenopathy Cardiovascular: RRR, No MRG Respiratory: CTA B Gastrointestinal: abdomen soft, NT, ND, BS+ Musculoskeletal: no deformities, strength intact in all 4 Skin: moist, warm, + stasis dermatitis bilat.   Assessment:     1. DM2, insulin-dependent, without complications, uncontrolled  Plan:     1. Patient with improved diet. He did not gain weight since lat visit. His sugars are worse as he reduced the amount of insulin with meals and snacks (?)  -  We will go back to his previous mealtime doses - I advised him to: Patient Instructions  Please continue the VGo 40 and the Metformin XR 2000 mg in am. Increase mealtime bolusing to 8 units with a regular meal, 10 with a large meal. Increase snack bolus to 4-6 units depending on the carb content. Please stop at the lab. Please come back for a follow-up appointment in 3 months with your sugar log. - strongly encouraged him to continue the vegetarian/vegan diet;  - I advised him to check his sugars throughout the day >> bring log at next visit - advised him to reschedule the eye exam! - refilled Humalog -  check A1c today - return to clinic in 3 months with his sugar log  Office Visit on 10/08/2013  Component Date Value Ref Range Status  . Hemoglobin A1C 10/08/2013 10.0* 4.6 - 6.5 % Final   Glycemic Control Guidelines for People with Diabetes:Non Diabetic:  <6%Goal of Therapy: <7%Additional Action Suggested:  >8%   Hb A1c higher >> if not improving >> may add Invokana at next visit.  Msg sent: Dear Troy [name], HbA1c a little higher than I would expected >> let's continue the vegetarian diet >> make it a little more vegan if you can, and let's increase the boluses as we discussed. Sincerely, Philemon Kingdom MD

## 2013-11-27 ENCOUNTER — Other Ambulatory Visit: Payer: Self-pay | Admitting: Internal Medicine

## 2013-12-16 ENCOUNTER — Emergency Department (HOSPITAL_BASED_OUTPATIENT_CLINIC_OR_DEPARTMENT_OTHER)
Admission: EM | Admit: 2013-12-16 | Discharge: 2013-12-16 | Disposition: A | Discharge status: Home / Self Care | Payer: Worker's Compensation | Attending: Emergency Medicine | Admitting: Emergency Medicine

## 2013-12-16 ENCOUNTER — Encounter (HOSPITAL_BASED_OUTPATIENT_CLINIC_OR_DEPARTMENT_OTHER): Payer: Self-pay | Admitting: Emergency Medicine

## 2013-12-16 ENCOUNTER — Emergency Department (HOSPITAL_BASED_OUTPATIENT_CLINIC_OR_DEPARTMENT_OTHER): Payer: Worker's Compensation

## 2013-12-16 DIAGNOSIS — Z791 Long term (current) use of non-steroidal anti-inflammatories (NSAID): Secondary | ICD-10-CM | POA: Insufficient documentation

## 2013-12-16 DIAGNOSIS — Z8669 Personal history of other diseases of the nervous system and sense organs: Secondary | ICD-10-CM | POA: Diagnosis not present

## 2013-12-16 DIAGNOSIS — Z7982 Long term (current) use of aspirin: Secondary | ICD-10-CM | POA: Diagnosis not present

## 2013-12-16 DIAGNOSIS — Y9389 Activity, other specified: Secondary | ICD-10-CM | POA: Insufficient documentation

## 2013-12-16 DIAGNOSIS — S161XXA Strain of muscle, fascia and tendon at neck level, initial encounter: Secondary | ICD-10-CM | POA: Insufficient documentation

## 2013-12-16 DIAGNOSIS — J45909 Unspecified asthma, uncomplicated: Secondary | ICD-10-CM | POA: Insufficient documentation

## 2013-12-16 DIAGNOSIS — Z86718 Personal history of other venous thrombosis and embolism: Secondary | ICD-10-CM | POA: Diagnosis not present

## 2013-12-16 DIAGNOSIS — E119 Type 2 diabetes mellitus without complications: Secondary | ICD-10-CM | POA: Diagnosis not present

## 2013-12-16 DIAGNOSIS — E669 Obesity, unspecified: Secondary | ICD-10-CM | POA: Diagnosis not present

## 2013-12-16 DIAGNOSIS — Z79899 Other long term (current) drug therapy: Secondary | ICD-10-CM | POA: Diagnosis not present

## 2013-12-16 DIAGNOSIS — S199XXA Unspecified injury of neck, initial encounter: Secondary | ICD-10-CM | POA: Diagnosis present

## 2013-12-16 DIAGNOSIS — W07XXXA Fall from chair, initial encounter: Secondary | ICD-10-CM | POA: Diagnosis not present

## 2013-12-16 DIAGNOSIS — Y990 Civilian activity done for income or pay: Secondary | ICD-10-CM | POA: Diagnosis not present

## 2013-12-16 DIAGNOSIS — Z794 Long term (current) use of insulin: Secondary | ICD-10-CM | POA: Diagnosis not present

## 2013-12-16 DIAGNOSIS — Y9289 Other specified places as the place of occurrence of the external cause: Secondary | ICD-10-CM | POA: Diagnosis not present

## 2013-12-16 HISTORY — DX: Unspecified asthma, uncomplicated: J45.909

## 2013-12-16 MED ORDER — NAPROXEN 500 MG PO TABS: 500.0000 mg | ORAL_TABLET | Freq: Two times a day (BID) | ORAL | Status: DC

## 2013-12-16 MED ORDER — HYDROCODONE-ACETAMINOPHEN 5-325 MG PO TABS: 1.0000 | ORAL_TABLET | ORAL | Status: DC | PRN

## 2013-12-16 NOTE — Discharge Instructions (Signed)

## 2013-12-16 NOTE — ED Notes (Signed)
Per EMS:  Pt sitting in chair at work, sent to sit back, chair rolled out and he fell back.  Did hit head on chair, no loc.  Pt c/o posterior neck pain, c-collar in place.

## 2013-12-16 NOTE — ED Provider Notes (Signed)
CSN: [id number]     Arrival date & time 12/16/13  1336 History   First MD Initiated Contact with Patient 12/16/13 1348     Chief Complaint  Patient presents with  . Fall    HPI Comments: Patient presents to emergency room with complaints of neck pain after a fall. Patient was attempting to sit in a chair at work and when he sat back the chair rolled out and he fell backwards. Patient hit his head on the chair. No LOC. After the fall he started experiencing pain in his neck. Patient was concerned because he does have a history of cervical spine surgery.  Patient is a 43 y.o. male presenting with fall. The history is provided by the patient.  Fall This is a new problem. The current episode started less than 1 hour ago. The problem occurs constantly. The problem has not changed since onset.Pertinent negatives include no abdominal pain and no headaches. Exacerbated by: movement. Nothing relieves the symptoms. He has tried nothing for the symptoms.    Past Medical History  Diagnosis Date  . DVT (deep venous thrombosis)   . OSA (obstructive sleep apnea)   . Diabetes insipidus   . Obesity   . Arm weakness     right   . Diabetes mellitus without complication   . Asthma    Past Surgical History  Procedure Laterality Date  . Fibula fracture surgery      plate & pin removed due to infection 1997  . Ulnar nerve repair    . Elbow surgery    . Cubital tunnel left arm      2003  . Cervical disc surgery      C6/C7 2009  . Back surgery     Family History  Problem Relation Age of Onset  . Heart disease Mother   . Other Mother     clotting disorders  . Diabetes Mother   . Hypertension    . Allergies    . Deep vein thrombosis    . Sleep apnea    . Obesity    . Other Father     sudden death/cervical and lumbar disk disease  . Aneurysm Father     In his 73s smoked  . Hyperlipidemia Father    History  Substance Use Topics  . Smoking status: Never Smoker   . Smokeless tobacco: Not on  file  . Alcohol Use: Yes     Comment: 1 drink mo.    Review of Systems  Gastrointestinal: Negative for abdominal pain.  Neurological: Negative for headaches.  All other systems reviewed and are negative.     Allergies  Review of patient's allergies indicates no known allergies.  Home Medications   Prior to Admission medications   Medication Sig Start Date End Date Taking? Authorizing Provider  albuterol (PROVENTIL HFA;VENTOLIN HFA) 108 (90 BASE) MCG/ACT inhaler Inhale 2 puffs into the lungs every 6 (six) hours as needed.      Historical Provider, MD  aspirin 325 MG tablet Take 650 mg by mouth daily.     Historical Provider, MD  glucose blood test strip Use as instructed 06/02/12   Philemon Kingdom, MD  glucose blood test strip Use as instructed 07/31/12   Philemon Kingdom, MD  HYDROcodone-acetaminophen (NORCO/VICODIN) 5-325 MG per tablet Take 1-2 tablets by mouth every 4 (four) hours as needed. 12/16/13   Dorie Rank, MD  Insulin Disposable Pump (V-GO 40) KIT Use once a day 06/15/13   Philemon Kingdom, MD  insulin lispro (HUMALOG) 100 UNIT/ML injection Use up to 80 units daily. To use with Vgo 40 system 10/08/13   Philemon Kingdom, MD  Insulin Pen Needle (BD ULTRA-FINE PEN NEEDLES) 29G X 12.7MM MISC Use 1 needle daily 02/21/11   Burnis Medin, MD  metFORMIN (GLUCOPHAGE-XR) 500 MG 24 hr tablet TAKE 4 TABLETS (2000 MG) DAILY WITH BREAKFAST 11/27/13   Philemon Kingdom, MD  naproxen (NAPROSYN) 500 MG tablet Take 1 tablet (500 mg total) by mouth 2 (two) times daily. 12/16/13   Dorie Rank, MD   BP 142/73  Pulse 67  Temp(Src) 98 F (36.7 C)  Resp 16  Wt 338 lb (153.316 kg)  SpO2 98% Physical Exam  Nursing note and vitals reviewed. Constitutional: He appears well-developed and well-nourished. No distress.  Obese   HENT:  Head: Normocephalic and atraumatic.  Right Ear: External ear normal.  Left Ear: External ear normal.  Eyes: Conjunctivae are normal. Right eye exhibits no discharge.  Left eye exhibits no discharge. No scleral icterus.  Neck: Neck supple. Spinous process tenderness and muscular tenderness present. No rigidity. Decreased range of motion present. No tracheal deviation present.  Cardiovascular: Normal rate, regular rhythm and intact distal pulses.   Pulmonary/Chest: Effort normal and breath sounds normal. No stridor. No respiratory distress. He has no wheezes. He has no rales.  Abdominal: Soft. Bowel sounds are normal. He exhibits no distension. There is no tenderness. There is no rebound and no guarding.  Musculoskeletal: He exhibits no edema and no tenderness.  Neurological: He is alert. He has normal strength. No cranial nerve deficit (no facial droop, extraocular movements intact, no slurred speech) or sensory deficit. He exhibits normal muscle tone. He displays no seizure activity. Coordination normal.  Skin: Skin is warm and dry. No rash noted.  Psychiatric: He has a normal mood and affect.    ED Course  Procedures (including critical care time) Labs Review Labs Reviewed - No data to display  Imaging Review Ct Cervical Spine Wo Contrast  12/16/2013   CLINICAL DATA:  Status post fall today following each error sliding out with the patient's striking the back of his head on the chair and now complaining of posterior neck pain; history of cervical spinal fusion 7 years ago ; the patient has an a hard collar  EXAM: CT CERVICAL SPINE WITHOUT CONTRAST  TECHNIQUE: Multidetector CT imaging of the cervical spine was performed without intravenous contrast. Multiplanar CT image reconstructions were also generated.  COMPARISON:  MRI of the cervical spine of April 30, 2008, and cervical spine series of January 17, 2007.  FINDINGS: The cervical vertebral bodies are preserved in height. The prevertebral soft tissue spaces are normal. The patient has undergone previous anterior fusion at C7-T1. The metallic hardware is intact. There is an anterior bridging osteophyte at  C6-7 with a near bridging osteophyte at C5-6. There is no perched facet nor facet or spinous process fracture. The odontoid is intact. The pulmonary apices are clear. The soft tissues of the neck are unremarkable.  IMPRESSION: 1. There is no acute cervical spine fracture nor dislocation. 2. There postsurgical changes at C7-T1 which are stable.   Electronically Signed   By: David  Martinique   On: 12/16/2013 14:29      MDM   Final diagnoses:  Cervical strain, acute, initial encounter   Cervical strain. No fracture.  Dc home with pain meds.    Dorie Rank, MD 12/16/13 [phone]

## 2013-12-22 ENCOUNTER — Encounter: Payer: Self-pay | Admitting: Internal Medicine

## 2013-12-22 ENCOUNTER — Ambulatory Visit (INDEPENDENT_AMBULATORY_CARE_PROVIDER_SITE_OTHER): Payer: Worker's Compensation | Admitting: Internal Medicine

## 2013-12-22 VITALS — BP 128/80 | Temp 97.6°F | Ht 71.5 in | Wt 341.9 lb

## 2013-12-22 DIAGNOSIS — S199XXA Unspecified injury of neck, initial encounter: Secondary | ICD-10-CM | POA: Insufficient documentation

## 2013-12-22 DIAGNOSIS — Z9889 Other specified postprocedural states: Secondary | ICD-10-CM

## 2013-12-22 DIAGNOSIS — W07XXXS Fall from chair, sequela: Secondary | ICD-10-CM

## 2013-12-22 DIAGNOSIS — S199XXS Unspecified injury of neck, sequela: Secondary | ICD-10-CM

## 2013-12-22 DIAGNOSIS — Z23 Encounter for immunization: Secondary | ICD-10-CM

## 2013-12-22 DIAGNOSIS — W07XXXA Fall from chair, initial encounter: Secondary | ICD-10-CM | POA: Insufficient documentation

## 2013-12-22 HISTORY — DX: Unspecified injury of neck, initial encounter: S19.9XXA

## 2013-12-22 NOTE — Progress Notes (Signed)
Pre visit review using our clinic review tool, if applicable. No additional management support is needed unless otherwise documented below in the visit note.   Chief Complaint  Patient presents with  . Neck Pain    HPI: Comes in for follow up from injury he sustained while at work seen in the emergency room.  see ed visit and fell occiput.  With a plastic chair no loss of consciousness or headache but had immediate  Had burning sensation  In neck  And went to ED.  Had  Pain to touch . DOI  Last wed  10 /[id number]  . Had improved over the weekend but then has gotten worse the last couple days after returning to work  Better some and went to work ; increasing pain began after about 3 hours into  Work had to take ibuprofen and then ice pack on neck.   Pain to left  No radiation down arm. Decreased range of motion to the left.  A.m. pain  3-4  Up to 5-6 . After goes to work. He has a history of cervical spine surgery Dr. [name]. Currently no new arm radiation weakness but is very concerned about his neck. He is using ibuprofen ice and change in position for pain relief and occasional hydrocodone to help him sleep at night. ROS: See pertinent positives and negatives per HPI. No chest pain shortness of breath other neurologic changes unusual headaches otherwise sore area on the base of his occiput but otherwise no serious headache no balance changes mental status changes.  Past Medical History  Diagnosis Date  . DVT (deep venous thrombosis)   . OSA (obstructive sleep apnea)   . Diabetes insipidus   . Obesity   . Arm weakness     right   . Diabetes mellitus without complication   . Asthma     Family History  Problem Relation Age of Onset  . Heart disease Mother   . Other Mother     clotting disorders  . Diabetes Mother   . Hypertension    . Allergies    . Deep vein thrombosis    . Sleep apnea    . Obesity    . Other Father     sudden death/cervical and lumbar disk disease  . Aneurysm  Father     In his 8s smoked  . Hyperlipidemia Father     History   Social History  . Marital Status: Married    Spouse Name: N/A    Number of Children: N/A  . Years of Education: N/A   Occupational History  . 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 Smoking status: Never Smoker   . Smokeless tobacco: None  . Alcohol Use: Yes     Comment: 1 drink mo.  . Drug Use: No  . Sexual Activity: Yes   Other Topics Concern  . None   Social History Narrative   Occupation:  days [name]   Worked 11- 8 am  Now changed job and shift to [employer] through Thursday    Working 10 hours days recently  [employer] [address].    Married '10 10 10   ' Wife s/p bariatric surgery pt   Regular exercise- yes   Pt does have children   Just moved to 4 r house up and down stairs   Father died suddenly 05/03/08   Daily caffeine use one a day   2 dogs and cat.  Outpatient Encounter Prescriptions as of 12/22/2013  Medication Sig  . albuterol (PROVENTIL HFA;VENTOLIN HFA) 108 (90 BASE) MCG/ACT inhaler Inhale 2 puffs into the lungs every 6 (six) hours as needed.    [name] aspirin 325 MG tablet Take 650 mg by mouth daily.   [name] glucose blood test strip Use as instructed  . glucose blood test strip Use as instructed  . HYDROcodone-acetaminophen (NORCO/VICODIN) 5-325 MG per tablet Take 1-2 tablets by mouth every 4 (four) hours as needed.  . Insulin Disposable Pump (V-GO 40) KIT Use once a day  . insulin lispro (HUMALOG) 100 UNIT/ML injection Use up to 80 units daily. To use with Vgo 40 system  . Insulin Pen Needle (BD ULTRA-FINE PEN NEEDLES) 29G X 12.7MM MISC Use 1 needle daily  . metFORMIN (GLUCOPHAGE-XR) 500 MG 24 hr tablet TAKE 4 TABLETS (2000 MG) DAILY WITH BREAKFAST  . naproxen (NAPROSYN) 500 MG tablet Take 1 tablet (500 mg total) by mouth 2 (two) times daily.    EXAM:  BP 128/80  Temp(Src) 97.6 F (36.4 C) (Oral)  Ht 5' 11.5" (1.816 m)  Wt 341 lb 14.4 oz (155.085  kg)  BMI 47.03 kg/m2  Body mass index is 47.03 kg/(m^2).  GENERAL: vitals reviewed and listed above, alert, oriented, appears well hydrated and in no acute distress HEENT:  conjunctiva  clear, no obvious abnormalities on inspection of external nose and ears NECK: no obvious masses on inspection palpation mild to moderate tenderness midline no unusual rashes or bruising MS: moves all extremities no change in the atrophy right upper extremity can shrug shoulders full range of motion. PSYCH: pleasant and cooperative, no obvious depression or anxiety Review of CT scan from emergency room visit. ASSESSMENT AND PLAN:  Discussed the following assessment and plan:  Neck injury, sequela  Hx of neck surgery  Fall from chair, sequela  Need for prophylactic vaccination and inoculation against influenza - Plan: Flu Vaccine QUAD 36+ mos PF IM (Fluarix Quad PF) Cervical strain with local neuralgia status post remote history of C-spine surgery fusion no alarm symptoms today except for worsening of pain after restarting work. Low threshold to refer back to his neurosurgeon. Hurt at this time would have him stay out of work the rest of this week reevaluate after the weekend about going back to work. If persistent progressive or other alarm symptoms would get Dr. [name] to reevaluate. -Patient advised to return or notify health care team  if symptoms worsen ,persist or new concerns arise.  Patient Instructions  No work this week  And recheck on Monday to decide on work.  Neck hygiene  For now and relative rest .   Low threshold  To get your surgeon to see you if not getting better.  If worsening   Or radiation or increasing weakness .  Call before fu . This is a neck strain  And burning  Goes with never irritation.   Cervical Sprain A cervical sprain is an injury in the neck in which the strong, fibrous tissues (ligaments) that connect your neck bones stretch or tear. Cervical sprains can range from  mild to severe. Severe cervical sprains can cause the neck vertebrae to be unstable. This can lead to damage of the spinal cord and can result in serious nervous system problems. The amount of time it takes for a cervical sprain to get better depends on the cause and extent of the injury. Most cervical sprains heal in 1 to 3 weeks. CAUSES  Severe cervical  sprains may be caused by:   Contact sport injuries (such as from football, rugby, wrestling, hockey, auto racing, gymnastics, diving, martial arts, or boxing).   Motor vehicle collisions.   Whiplash injuries. This is an injury from a sudden forward and backward whipping movement of the head and neck.  Falls.  Mild cervical sprains may be caused by:   Being in an awkward position, such as while cradling a telephone between your ear and shoulder.   Sitting in a chair that does not offer proper support.   Working at a poorly Landscape architect station.   Looking up or down for long periods of time.  SYMPTOMS   Pain, soreness, stiffness, or a burning sensation in the front, back, or sides of the neck. This discomfort may develop immediately after the injury or slowly, 24 hours or more after the injury.   Pain or tenderness directly in the middle of the back of the neck.   Shoulder or upper back pain.   Limited ability to move the neck.   Headache.   Dizziness.   Weakness, numbness, or tingling in the hands or arms.   Muscle spasms.   Difficulty swallowing or chewing.   Tenderness and swelling of the neck.  DIAGNOSIS  Most of the time your health care provider can diagnose a cervical sprain by taking your history and doing a physical exam. Your health care provider will ask about previous neck injuries and any known neck problems, such as arthritis in the neck. X-rays may be taken to find out if there are any other problems, such as with the bones of the neck. Other tests, such as a CT scan or MRI, may also be  needed.  TREATMENT  Treatment depends on the severity of the cervical sprain. Mild sprains can be treated with rest, keeping the neck in place (immobilization), and pain medicines. Severe cervical sprains are immediately immobilized. Further treatment is done to help with pain, muscle spasms, and other symptoms and may include:  Medicines, such as pain relievers, numbing medicines, or muscle relaxants.   Physical therapy. This may involve stretching exercises, strengthening exercises, and posture training. Exercises and improved posture can help stabilize the neck, strengthen muscles, and help stop symptoms from returning.  HOME CARE INSTRUCTIONS   Put ice on the injured area.   Put ice in a plastic bag.   Place a towel between your skin and the bag.   Leave the ice on for 15-20 minutes, 3-4 times a day.   If your injury was severe, you may have been given a cervical collar to wear. A cervical collar is a two-piece collar designed to keep your neck from moving while it heals.  Do not remove the collar unless instructed by your health care provider.  If you have long hair, keep it outside of the collar.  Ask your health care provider before making any adjustments to your collar. Minor adjustments may be required over time to improve comfort and reduce pressure on your chin or on the back of your head.  Ifyou are allowed to remove the collar for cleaning or bathing, follow your health care provider's instructions on how to do so safely.  Keep your collar clean by wiping it with mild soap and water and drying it completely. If the collar you have been given includes removable pads, remove them every 1-2 days and hand wash them with soap and water. Allow them to air dry. They should be completely dry before  you wear them in the collar.  If you are allowed to remove the collar for cleaning and bathing, wash and dry the skin of your neck. Check your skin for irritation or sores. If you  see any, tell your health care provider.  Do not drive while wearing the collar.   Only take over-the-counter or prescription medicines for pain, discomfort, or fever as directed by your health care provider.   Keep all follow-up appointments as directed by your health care provider.   Keep all physical therapy appointments as directed by your health care provider.   Make any needed adjustments to your workstation to promote good posture.   Avoid positions and activities that make your symptoms worse.   Warm up and stretch before being active to help prevent problems.  SEEK MEDICAL CARE IF:   Your pain is not controlled with medicine.   You are unable to decrease your pain medicine over time as planned.   Your activity level is not improving as expected.  SEEK IMMEDIATE MEDICAL CARE IF:   You develop any bleeding.  You develop stomach upset.  You have signs of an allergic reaction to your medicine.   Your symptoms get worse.   You develop new, unexplained symptoms.   You have numbness, tingling, weakness, or paralysis in any part of your body.  MAKE SURE YOU:   Understand these instructions.  Will watch your condition.  Will get help right away if you are not doing well or get worse. Document Released: 12/24/2006 Document Revised: 03/03/2013 Document Reviewed: 09/03/2012 [facility] Patient Information 2015 [place], [place]. This information is not intended to replace advice given to you by your health care provider. Make sure you discuss any questions you have with your health care provider.       [name]. [name] M.D.

## 2013-12-22 NOTE — Patient Instructions (Signed)
No work this week  And recheck on Monday to decide on work.  Neck hygiene  For now and relative rest .   Low threshold  To get your surgeon to see you if not getting better.  If worsening   Or radiation or increasing weakness .  Call before fu . This is a neck strain  And burning  Goes with never irritation.   Cervical Sprain A cervical sprain is an injury in the neck in which the strong, fibrous tissues (ligaments) that connect your neck bones stretch or tear. Cervical sprains can range from mild to severe. Severe cervical sprains can cause the neck vertebrae to be unstable. This can lead to damage of the spinal cord and can result in serious nervous system problems. The amount of time it takes for a cervical sprain to get better depends on the cause and extent of the injury. Most cervical sprains heal in 1 to 3 weeks. CAUSES  Severe cervical sprains may be caused by:   Contact sport injuries (such as from football, rugby, wrestling, hockey, auto racing, gymnastics, diving, martial arts, or boxing).   Motor vehicle collisions.   Whiplash injuries. This is an injury from a sudden forward and backward whipping movement of the head and neck.  Falls.  Mild cervical sprains may be caused by:   Being in an awkward position, such as while cradling a telephone between your ear and shoulder.   Sitting in a chair that does not offer proper support.   Working at a poorly Landscape architect station.   Looking up or down for long periods of time.  SYMPTOMS   Pain, soreness, stiffness, or a burning sensation in the front, back, or sides of the neck. This discomfort may develop immediately after the injury or slowly, 24 hours or more after the injury.   Pain or tenderness directly in the middle of the back of the neck.   Shoulder or upper back pain.   Limited ability to move the neck.   Headache.   Dizziness.   Weakness, numbness, or tingling in the hands or arms.   Muscle  spasms.   Difficulty swallowing or chewing.   Tenderness and swelling of the neck.  DIAGNOSIS  Most of the time your health care provider can diagnose a cervical sprain by taking your history and doing a physical exam. Your health care provider will ask about previous neck injuries and any known neck problems, such as arthritis in the neck. X-rays may be taken to find out if there are any other problems, such as with the bones of the neck. Other tests, such as a CT scan or MRI, may also be needed.  TREATMENT  Treatment depends on the severity of the cervical sprain. Mild sprains can be treated with rest, keeping the neck in place (immobilization), and pain medicines. Severe cervical sprains are immediately immobilized. Further treatment is done to help with pain, muscle spasms, and other symptoms and may include:  Medicines, such as pain relievers, numbing medicines, or muscle relaxants.   Physical therapy. This may involve stretching exercises, strengthening exercises, and posture training. Exercises and improved posture can help stabilize the neck, strengthen muscles, and help stop symptoms from returning.  HOME CARE INSTRUCTIONS   Put ice on the injured area.   Put ice in a plastic bag.   Place a towel between your skin and the bag.   Leave the ice on for 15-20 minutes, 3-4 times a day.   If  your injury was severe, you may have been given a cervical collar to wear. A cervical collar is a two-piece collar designed to keep your neck from moving while it heals.  Do not remove the collar unless instructed by your health care provider.  If you have long hair, keep it outside of the collar.  Ask your health care provider before making any adjustments to your collar. Minor adjustments may be required over time to improve comfort and reduce pressure on your chin or on the back of your head.  Ifyou are allowed to remove the collar for cleaning or bathing, follow your health care  provider's instructions on how to do so safely.  Keep your collar clean by wiping it with mild soap and water and drying it completely. If the collar you have been given includes removable pads, remove them every 1-2 days and hand wash them with soap and water. Allow them to air dry. They should be completely dry before you wear them in the collar.  If you are allowed to remove the collar for cleaning and bathing, wash and dry the skin of your neck. Check your skin for irritation or sores. If you see any, tell your health care provider.  Do not drive while wearing the collar.   Only take over-the-counter or prescription medicines for pain, discomfort, or fever as directed by your health care provider.   Keep all follow-up appointments as directed by your health care provider.   Keep all physical therapy appointments as directed by your health care provider.   Make any needed adjustments to your workstation to promote good posture.   Avoid positions and activities that make your symptoms worse.   Warm up and stretch before being active to help prevent problems.  SEEK MEDICAL CARE IF:   Your pain is not controlled with medicine.   You are unable to decrease your pain medicine over time as planned.   Your activity level is not improving as expected.  SEEK IMMEDIATE MEDICAL CARE IF:   You develop any bleeding.  You develop stomach upset.  You have signs of an allergic reaction to your medicine.   Your symptoms get worse.   You develop new, unexplained symptoms.   You have numbness, tingling, weakness, or paralysis in any part of your body.  MAKE SURE YOU:   Understand these instructions.  Will watch your condition.  Will get help right away if you are not doing well or get worse. Document Released: 12/24/2006 Document Revised: 03/03/2013 Document Reviewed: 09/03/2012 [facility] Patient Information 2015 [place], [place]. This information is not intended to replace  advice given to you by your health care provider. Make sure you discuss any questions you have with your health care provider.

## 2013-12-28 ENCOUNTER — Ambulatory Visit: Payer: Self-pay | Admitting: Internal Medicine

## 2014-01-08 ENCOUNTER — Ambulatory Visit: Payer: BC Managed Care – PPO | Admitting: Internal Medicine

## 2014-01-13 ENCOUNTER — Ambulatory Visit: Payer: BC Managed Care – PPO | Admitting: Internal Medicine

## 2014-03-23 ENCOUNTER — Ambulatory Visit: Payer: BC Managed Care – PPO | Admitting: Internal Medicine

## 2014-04-02 ENCOUNTER — Other Ambulatory Visit: Payer: Self-pay | Admitting: Internal Medicine

## 2014-04-20 ENCOUNTER — Ambulatory Visit: Payer: BC Managed Care – PPO | Admitting: Pulmonary Disease

## 2014-04-23 ENCOUNTER — Encounter: Payer: Self-pay | Admitting: Pulmonary Disease

## 2014-04-23 ENCOUNTER — Ambulatory Visit (INDEPENDENT_AMBULATORY_CARE_PROVIDER_SITE_OTHER): Payer: BLUE CROSS/BLUE SHIELD | Admitting: Pulmonary Disease

## 2014-04-23 VITALS — BP 136/74 | HR 79 | Temp 97.6°F | Ht 71.0 in | Wt 346.2 lb

## 2014-04-23 DIAGNOSIS — G4733 Obstructive sleep apnea (adult) (pediatric): Secondary | ICD-10-CM

## 2014-04-23 NOTE — Patient Instructions (Signed)
Will order you a new cpap machine, and set on the auto setting for now Work on weight loss followup with me again in one year if doing well.

## 2014-04-23 NOTE — Progress Notes (Signed)
   Subjective:    Patient ID: Troy [name], male    DOB: 02/21/1971, 44 y.o.   MRN: [id number]  HPI The patient comes in today for follow-up of his obstructive sleep apnea. He is wearing C Pap compliantly, and feels that he sleeps well with his device. He is having issues with the buttons on his C Pap machine, and tells me that it is over 44 years old. Therefore, he is due for a new machine. Of note, the patient has gained weight since the last visit.   Review of Systems  Constitutional: Negative for fever and unexpected weight change.  HENT: Negative for congestion, dental problem, ear pain, nosebleeds, postnasal drip, rhinorrhea, sinus pressure, sneezing, sore throat and trouble swallowing.   Eyes: Negative for redness and itching.  Respiratory: Negative for cough, chest tightness, shortness of breath and wheezing.   Cardiovascular: Negative for palpitations and leg swelling.  Gastrointestinal: Negative for nausea and vomiting.  Genitourinary: Negative for dysuria.  Musculoskeletal: Negative for joint swelling.  Skin: Negative for rash.  Neurological: Negative for headaches.  Hematological: Does not bruise/bleed easily.  Psychiatric/Behavioral: Negative for dysphoric mood. The patient is not nervous/anxious.        Objective:   Physical Exam Morbidly obese male in no acute distress Nose without purulence or discharge noted Neck without lymphadenopathy or thyromegaly No skin breakdown or pressure necrosis from the C Pap mask Lower extremities with mild edema, no cyanosis Alert and oriented, moves all 4 extremities.       Assessment & Plan:

## 2014-04-23 NOTE — Assessment & Plan Note (Signed)
The patient is wearing C Pap compliantly, and overall feels that he is doing well with his device. He is now having issues with the buttons working properly, and he tells me that his machine is over 44 years old. He is overdue for a new device, and we will send the appropriate order to his home care company. Finally, I have encouraged him to work aggressively on weight loss.

## 2014-06-14 ENCOUNTER — Encounter: Payer: Self-pay | Admitting: Gastroenterology

## 2014-06-21 ENCOUNTER — Other Ambulatory Visit: Payer: Self-pay | Admitting: Internal Medicine

## 2014-07-20 ENCOUNTER — Other Ambulatory Visit: Payer: Self-pay | Admitting: Internal Medicine

## 2014-07-21 NOTE — Telephone Encounter (Signed)
Please communicate to the pt: needs OV for further refills!

## 2014-08-11 ENCOUNTER — Encounter: Payer: Self-pay | Admitting: Gastroenterology

## 2014-12-06 ENCOUNTER — Other Ambulatory Visit: Payer: BLUE CROSS/BLUE SHIELD

## 2014-12-13 ENCOUNTER — Encounter: Payer: BLUE CROSS/BLUE SHIELD | Admitting: Internal Medicine

## 2015-04-09 ENCOUNTER — Other Ambulatory Visit: Payer: Self-pay | Admitting: Internal Medicine

## 2015-04-22 ENCOUNTER — Ambulatory Visit (INDEPENDENT_AMBULATORY_CARE_PROVIDER_SITE_OTHER): Payer: Managed Care, Other (non HMO) | Admitting: Internal Medicine

## 2015-04-22 ENCOUNTER — Other Ambulatory Visit: Payer: Self-pay | Admitting: Internal Medicine

## 2015-04-22 ENCOUNTER — Encounter: Payer: Self-pay | Admitting: Internal Medicine

## 2015-04-22 ENCOUNTER — Ambulatory Visit (HOSPITAL_COMMUNITY)
Admission: RE | Admit: 2015-04-22 | Discharge: 2015-04-22 | Disposition: A | Discharge status: Home / Self Care | Payer: Managed Care, Other (non HMO) | Source: Ambulatory Visit | Attending: Internal Medicine | Admitting: Internal Medicine

## 2015-04-22 VITALS — BP 128/80 | HR 82 | Temp 97.6°F | Wt 314.8 lb

## 2015-04-22 DIAGNOSIS — R600 Localized edema: Secondary | ICD-10-CM | POA: Diagnosis not present

## 2015-04-22 DIAGNOSIS — R52 Pain, unspecified: Secondary | ICD-10-CM

## 2015-04-22 DIAGNOSIS — E1165 Type 2 diabetes mellitus with hyperglycemia: Secondary | ICD-10-CM

## 2015-04-22 DIAGNOSIS — Z86718 Personal history of other venous thrombosis and embolism: Secondary | ICD-10-CM | POA: Diagnosis not present

## 2015-04-22 DIAGNOSIS — IMO0001 Reserved for inherently not codable concepts without codable children: Secondary | ICD-10-CM

## 2015-04-22 DIAGNOSIS — R609 Edema, unspecified: Secondary | ICD-10-CM

## 2015-04-22 DIAGNOSIS — I82401 Acute embolism and thrombosis of unspecified deep veins of right lower extremity: Secondary | ICD-10-CM

## 2015-04-22 LAB — BASIC METABOLIC PANEL
BUN: 11 mg/dL (ref 6–23)
CO2: 29 meq/L (ref 19–32)
Calcium: 9.2 mg/dL (ref 8.4–10.5)
Chloride: 100 mEq/L (ref 96–112)
Creatinine, Ser: 0.67 mg/dL (ref 0.40–1.50)
GFR: 136.52 mL/min (ref >60.00)
GLUCOSE: 359 mg/dL — AB (ref 70–99)
POTASSIUM: 4.9 meq/L (ref 3.5–5.1)
Sodium: 137 mEq/L (ref 135–145)

## 2015-04-22 MED ORDER — APIXABAN 5 MG PO TABS: ORAL_TABLET | ORAL | Status: DC

## 2015-04-22 NOTE — Progress Notes (Addendum)
e visit review using our clinic review tool, if applicable. No additional management support is needed unless otherwise documented below in the visit note.  Chief Complaint  Patient presents with  . Right Leg Swelling    HPI: Patient Troy [name]  comes in today for SDA for  new problem evaluation. Just got back from cruise to [place]  And had a good time lots of walking   Drove back from [place] 5 hours   [place] swelling  rightl eg without  Pain redness   Just like  When  Dvt.   No sob cough other illness fall   Has vv no change . Overdue for dm check to   Fu dr Darnell Level . Soon  Mom had been sick with  Renal failure  Anemia resp .  ROS: See pertinent positives and negatives per HPI. No resp vision cop sx  No knee swelling  Past Medical History  Diagnosis Date  . DVT (deep venous thrombosis) ([place])   . OSA (obstructive sleep apnea)   . Diabetes insipidus ([place])   . Obesity   . Arm weakness     right   . Diabetes mellitus without complication ([place])   . Asthma     Family History  Problem Relation Age of Onset  . Heart disease Mother   . Other Mother     clotting disorders  . Diabetes Mother   . Hypertension    . Allergies    . Deep vein thrombosis    . Sleep apnea    . Obesity    . Other Father     sudden death/cervical and lumbar disk disease  . Aneurysm Father     In his 51s smoked  . Hyperlipidemia Father     Social History   Social History  . Marital Status: Married    Spouse Name: N/A  . Number of Children: N/A  . Years of Education: N/A   Occupational History  . 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 Smoking status: Never Smoker   . Smokeless tobacco: None  . Alcohol Use: Yes     Comment: 1 drink mo.  . Drug Use: No  . Sexual Activity: Yes   Other Topics Concern  . None   Social History Narrative   Occupation:  days [name]   Worked 11- 8 am  Now changed job and shift to [employer] through Thursday    Working  10 hours days recently  [employer] [address].    Married '10 10 10   ' Wife s/p bariatric surgery pt   Regular exercise- yes   Pt does have children   Just moved to 4 r house up and down stairs   Father died suddenly 05-11-2008   Daily caffeine use one a day   2 dogs and cat.                 Outpatient Prescriptions Prior to Visit  Medication Sig Dispense Refill  . albuterol (PROVENTIL HFA;VENTOLIN HFA) 108 (90 BASE) MCG/ACT inhaler Inhale 2 puffs into the lungs every 6 (six) hours as needed.      [name] aspirin 325 MG tablet Take 650 mg by mouth daily.     [name] glucose blood test strip Use as instructed 100 each 12  . glucose blood test strip Use as instructed 100 each 12  . Insulin Disposable Pump (V-GO 40) KIT USE ONCE DAILY 90 kit 0  .  insulin lispro (HUMALOG) 100 UNIT/ML injection Use up to 80 units daily. To use with Vgo 40 system 90 mL 3  . metFORMIN (GLUCOPHAGE-XR) 500 MG 24 hr tablet TAKE 4 TABLETS (2000 MG) DAILY WITH BREAKFAST 360 tablet 0  . Insulin Pen Needle (BD ULTRA-FINE PEN NEEDLES) 29G X 12.7MM MISC Use 1 needle daily 30 each 11   No facility-administered medications prior to visit.     EXAM:  BP 128/80 mmHg  Pulse 82  Temp(Src) 97.6 F (36.4 C) (Oral)  Wt 314 lb 12.8 oz (142.792 kg)  SpO2 98%  Body mass index is 43.93 kg/(m^2).  GENERAL: vitals reviewed and listed above, alert, oriented, appears well hydrated and in no acute distress looks well   HEENT: atraumatic, conjunctiva  clear, no obvious abnormalities on inspection of external nose and ears  NECK: no obvious masses on inspection palpation  LUNGS: clear to auscultation bilaterally, no wheezes, rales or rhonchi, good air movement CV: HRRR, no clubbing cyanosis or  peripheral edema nl cap refill  MS: moves all extremities without noticeable focal  Abnormality  right leg 3+ edema with vv and old changes  Below knee no redness or cords .  No shininess  PSYCH: pleasant and cooperative, no obvious depression or  anxiety  ASSESSMENT AND PLAN:  Discussed the following assessment and plan:  Leg edema, right - Plan: Basic metabolic panel, CBC with Differential/Platelet, VAS [name] LOWER EXTREMITY VENOUS (DVT)  Hx of deep venous thrombosis - Plan: Basic metabolic panel, CBC with Differential/Platelet, VAS [name] LOWER EXTREMITY VENOUS (DVT)  Uncontrolled diabetes mellitus type 2 without complications, unspecified long term insulin use status ([name])  Morbid obesity, unspecified obesity type ([place])  -Patient advised to return or notify health care team  if symptoms worsen ,persist or new concerns arise.  Patient Instructions  Get doppler   Today   If positive  Will use antocoagulation   And fu as appropriate  If negative we  stil lwant you to elevated.  leg and plan fu.  Deep Vein Thrombosis A deep vein thrombosis (DVT) is a blood clot (thrombus) that usually occurs in a deep, larger vein of the lower leg or the pelvis, or in an upper extremity such as the arm. These are dangerous and can lead to serious and even life-threatening complications if the clot travels to the lungs. A DVT can damage the valves in your leg veins so that instead of flowing upward, the blood pools in the lower leg. This is called post-thrombotic syndrome, and it can result in pain, swelling, discoloration, and sores on the leg. CAUSES A DVT is caused by the formation of a blood clot in your leg, pelvis, or arm. Usually, several things contribute to the formation of blood clots. A clot may develop when:  Your blood flow slows down.  Your vein becomes damaged in some way.  You have a condition that makes your blood clot more easily. RISK FACTORS A DVT is more likely to develop in:  People who are older, especially over 21 years of age.  People who are overweight (obese).  People who sit or lie still for a long time, such as during long-distance travel (over 4 hours), bed rest, hospitalization, or during recovery from certain  medical conditions like a stroke.  People who do not engage in much physical activity (sedentary lifestyle).  People who have chronic breathing disorders.  People who have a personal or family history of blood clots or blood clotting disease.  People  who have peripheral vascular disease (PVD), diabetes, or some types of cancer.  People who have heart disease, especially if the person had a recent heart attack or has congestive heart failure.  People who have neurological diseases that affect the legs (leg paresis).  People who have had a traumatic injury, such as breaking a hip or leg.  People who have recently had major or lengthy surgery, especially on the hip, knee, or abdomen.  People who have had a central line placed inside a large vein.  People who take medicines that contain the hormone estrogen. These include birth control pills and hormone replacement therapy.  Pregnancy or during childbirth or the postpartum period.  Long plane flights (over 8 hours). SIGNS AND SYMPTOMS Symptoms of a DVT can include:   Swelling of your leg or arm, especially if one side is much worse.  Warmth and redness of your leg or arm, especially if one side is much worse.  Pain in your arm or leg. If the clot is in your leg, symptoms may be more noticeable or worse when you stand or walk.  A feeling of pins and needles, if the clot is in the arm. The symptoms of a DVT that has traveled to the lungs (pulmonary embolism, PE) usually start suddenly and include:  Shortness of breath while active or at rest.  Coughing or coughing up blood or blood-tinged mucus.  Chest pain that is often worse with deep breaths.  Rapid or irregular heartbeat.  Feeling light-headed or dizzy.  Fainting.  Feeling anxious.  Sweating. There may also be pain and swelling in a leg if that is where the blood clot started. These symptoms may represent a serious problem that is an emergency. Do not wait to see if  the symptoms will go away. Get medical help right away. Call your local emergency services (911 in the [place] Do not drive yourself to the hospital. DIAGNOSIS Your health care provider will take a medical history and perform a physical exam. You may also have other tests, including:  Blood tests to assess the clotting properties of your blood.  Imaging tests, such as CT, ultrasound, MRI, X-ray, and other tests to see if you have clots anywhere in your body. TREATMENT After a DVT is identified, it can be treated. The type of treatment that you receive depends on many factors, such as the cause of your DVT, your risk for bleeding or developing more clots, and other medical conditions that you have. Sometimes, a combination of treatments is necessary. Treatment options may be combined and include:  Monitoring the blood clot with ultrasound.  Taking medicines by mouth, such as newer blood thinners (anticoagulants), thrombolytics, or warfarin.  Taking anticoagulant medicine by injection or through an IV tube.  Wearing compression stockings or using different types ofdevices.  Surgery (rare) to remove the blood clot or to place a filter in your abdomen to stop the blood clot from traveling to your lungs. Treatments for a DVT are often divided into immediate treatment and long-term treatment (up to 3 months after DVT). You can work with your health care provider to choose the treatment program that is best for you. HOME CARE INSTRUCTIONS If you are taking a newer oral anticoagulant:  Take the medicine every single day at the same time each day.  Understand what foods and drugs interact with this medicine.  Understand that there are no regular blood tests required when using this medicine.  Understand the side effects of  this medicine, including excessive bruising or bleeding. Ask your health care provider or pharmacist about other possible side effects. If you are taking  warfarin:  Understand how to take warfarin and know which foods can affect how warfarin works in Veterinary surgeon.  Understand that it is dangerous to take too much or too little warfarin. Too much warfarin increases the risk of bleeding. Too little warfarin continues to allow the risk for blood clots.  Follow your PT and INR blood testing schedule. The PT and INR results allow your health care provider to adjust your dose of warfarin. It is very important that you have your PT and INR tested as often as told by your health care provider.  Avoid major changes in your diet, or tell your health care provider before you change your diet. Arrange a visit with a registered dietitian to answer your questions. Many foods, especially foods that are high in vitamin K, can interfere with warfarin and affect the PT and INR results. Eat a consistent amount of foods that are high in vitamin K, such as:  Spinach, kale, broccoli, cabbage, collard greens, turnip greens, Brussels sprouts, peas, cauliflower, seaweed, and parsley.  Beef liver and pork liver.  Green tea.  Soybean oil.  Tell your health care provider about any and all medicines, vitamins, and supplements that you take, including aspirin and other over-the-counter anti-inflammatory medicines. Be especially cautious with aspirin and anti-inflammatory medicines. Do not take those before you ask your health care provider if it is safe to do so. This is important because many medicines can interfere with warfarin and affect the PT and INR results.  Do not start or stop taking any over-the-counter or prescription medicine unless your health care provider or pharmacist tells you to do so. If you take warfarin, you will also need to do these things:  Hold pressure over cuts for longer than usual.  Tell your dentist and other health care providers that you are taking warfarin before you have any procedures in which bleeding may occur.  Avoid alcohol or drink  very small amounts. Tell your health care provider if you change your alcohol intake.  Do not use tobacco products, including cigarettes, chewing tobacco, and e-cigarettes. If you need help quitting, ask your health care provider.  Avoid contact sports. General Instructions  Take over-the-counter and prescription medicines only as told by your health care provider. Anticoagulant medicines can have side effects, including easy bruising and difficulty stopping bleeding. If you are prescribed an anticoagulant, you will also need to do these things:  Hold pressure over cuts for longer than usual.  Tell your dentist and other health care providers that you are taking anticoagulants before you have any procedures in which bleeding may occur.  Avoid contact sports.  Wear a medical alert bracelet or carry a medical alert card that says you have had a PE.  Ask your health care provider how soon you can go back to your normal activities. Stay active to prevent new blood clots from forming.  Make sure to exercise while traveling or when you have been sitting or standing for a long period of time. It is very important to exercise. Exercise your legs by walking or by tightening and relaxing your leg muscles often. Take frequent walks.  Wear compression stockings as told by your health care provider to help prevent more blood clots from forming.  Do not use tobacco products, including cigarettes, chewing tobacco, and e-cigarettes. If you need help quitting,  ask your health care provider.  Keep all follow-up appointments with your health care provider. This is important. PREVENTION Take these actions to decrease your risk of developing another DVT:  Exercise regularly. For at least 30 minutes every day, engage in:  Activity that involves moving your arms and legs.  Activity that encourages good blood flow through your body by increasing your heart rate.  Exercise your arms and legs every hour  during long-distance travel (over 4 hours). Drink plenty of water and avoid drinking alcohol while traveling.  Avoid sitting or lying in bed for long periods of time without moving your legs.  Maintain a weight that is appropriate for your height. Ask your health care provider what weight is healthy for you.  If you are a woman who is over 32 years of age, avoid unnecessary use of medicines that contain estrogen. These include birth control pills.  Do not smoke, especially if you take estrogen medicines. If you need help quitting, ask your health care provider. If you are hospitalized, prevention measures may include:  Early walking after surgery, as soon as your health care provider says that it is safe.  Receiving anticoagulants to prevent blood clots.If you cannot take anticoagulants, other options may be available, such as wearing compression stockings or using different types of devices. SEEK IMMEDIATE MEDICAL CARE IF:  You have new or increased pain, swelling, or redness in an arm or leg.  You have numbness or tingling in an arm or leg.  You have shortness of breath while active or at rest.  You have chest pain.  You have a rapid or irregular heartbeat.  You feel light-headed or dizzy.  You cough up blood.  You notice blood in your vomit, bowel movement, or urine. These symptoms may represent a serious problem that is an emergency. Do not wait to see if the symptoms will go away. Get medical help right away. Call your local emergency services (911 in the [place] Do not drive yourself to the hospital.   This information is not intended to replace advice given to you by your health care provider. Make sure you discuss any questions you have with your health care provider.   Document Released: 02/26/2005 Document Revised: 11/17/2014 Document Reviewed: 06/23/2014 Elsevier Interactive Patient Education 2016 [employer]         Edema Edema is an abnormal buildup of  fluids in your bodytissues. Edema is somewhatdependent on gravity to pull the fluid to the lowest place in your body. That makes the condition more common in the legs and thighs (lower extremities). Painless swelling of the feet and ankles is common and becomes more likely as you get older. It is also common in looser tissues, like around your eyes.  When the affected area is squeezed, the fluid may move out of that spot and leave a dent for a few moments. This dent is called pitting.  CAUSES  There are many possible causes of edema. Eating too much salt and being on your feet or sitting for a long time can cause edema in your legs and ankles. Hot weather may make edema worse. Common medical causes of edema include:  Heart failure.  Liver disease.  Kidney disease.  Weak blood vessels in your legs.  Cancer.  An injury.  Pregnancy.  Some medications.  Obesity. SYMPTOMS  Edema is usually painless.Your skin may look swollen or shiny.  DIAGNOSIS  Your health care provider may be able to diagnose edema by asking  about your medical history and doing a physical exam. You may need to have tests such as X-rays, an electrocardiogram, or blood tests to check for medical conditions that may cause edema.  TREATMENT  Edema treatment depends on the cause. If you have heart, liver, or kidney disease, you need the treatment appropriate for these conditions. General treatment may include:  Elevation of the affected body part above the level of your heart.  Compression of the affected body part. Pressure from elastic bandages or support stockings squeezes the tissues and forces fluid back into the blood vessels. This keeps fluid from entering the tissues.  Restriction of fluid and salt intake.  Use of a water pill (diuretic). These medications are appropriate only for some types of edema. They pull fluid out of your body and make you urinate more often. This gets rid of fluid and reduces swelling,  but diuretics can have side effects. Only use diuretics as directed by your health care provider. HOME CARE INSTRUCTIONS   Keep the affected body part above the level of your heart when you are lying down.   Do not sit still or stand for prolonged periods.   Do not put anything directly under your knees when lying down.  Do not wear constricting clothing or garters on your upper legs.   Exercise your legs to work the fluid back into your blood vessels. This may help the swelling go down.   Wear elastic bandages or support stockings to reduce ankle swelling as directed by your health care provider.   Eat a low-salt diet to reduce fluid if your health care provider recommends it.   Only take medicines as directed by your health care provider. SEEK MEDICAL CARE IF:   Your edema is not responding to treatment.  You have heart, liver, or kidney disease and notice symptoms of edema.  You have edema in your legs that does not improve after elevating them.   You have sudden and unexplained weight gain. SEEK IMMEDIATE MEDICAL CARE IF:   You develop shortness of breath or chest pain.   You cannot breathe when you lie down.  You develop pain, redness, or warmth in the swollen areas.   You have heart, liver, or kidney disease and suddenly get edema.  You have a fever and your symptoms suddenly get worse. MAKE SURE YOU:   Understand these instructions.  Will watch your condition.  Will get help right away if you are not doing well or get worse.   This information is not intended to replace advice given to you by your health care provider. Make sure you discuss any questions you have with your health care provider.   Document Released: 02/26/2005 Document Revised: 03/19/2014 Document Reviewed: 12/19/2012 Elsevier Interactive Patient Education 2016 [place]. Nadim Malia M.D.    Voice  Report  dvt femoral and politeal dvt   Present   dsic with  patient to stop asa   Begin anticoagulation leg up and make appt for next week.  Sending med into  Local pharmacy  Bmp still pending but renal function nl in the past

## 2015-04-22 NOTE — Addendum Note (Signed)
Addended byBurnis Medin on: 04/22/2015 05:49 PM   Modules accepted: Orders

## 2015-04-22 NOTE — Progress Notes (Signed)
VASCULAR LAB PRELIMINARY  PRELIMINARY  PRELIMINARY  PRELIMINARY  Right lower extremity venous Doppler has been completed.  Right:  DVT acute thrombus is noted in proximal Femoral vein and partial thrombus popliteal vein.  No evidence of superficial thrombosis.  No Baker's cyst.  Called on call service for Dr. Regis Bill  Office. Waiting for call back for instruction to give patients.  Janifer Adie, RVT, RDMS 04/22/2015, 5:21 PM

## 2015-04-22 NOTE — Patient Instructions (Signed)
Get doppler   Today   If positive  Will use antocoagulation   And fu as appropriate  If negative we  stil lwant you to elevated.  leg and plan fu.  Deep Vein Thrombosis A deep vein thrombosis (DVT) is a blood clot (thrombus) that usually occurs in a deep, larger vein of the lower leg or the pelvis, or in an upper extremity such as the arm. These are dangerous and can lead to serious and even life-threatening complications if the clot travels to the lungs. A DVT can damage the valves in your leg veins so that instead of flowing upward, the blood pools in the lower leg. This is called post-thrombotic syndrome, and it can result in pain, swelling, discoloration, and sores on the leg. CAUSES A DVT is caused by the formation of a blood clot in your leg, pelvis, or arm. Usually, several things contribute to the formation of blood clots. A clot may develop when:  Your blood flow slows down.  Your vein becomes damaged in some way.  You have a condition that makes your blood clot more easily. RISK FACTORS A DVT is more likely to develop in:  People who are older, especially over 79 years of age.  People who are overweight (obese).  People who sit or lie still for a long time, such as during long-distance travel (over 4 hours), bed rest, hospitalization, or during recovery from certain medical conditions like a stroke.  People who do not engage in much physical activity (sedentary lifestyle).  People who have chronic breathing disorders.  People who have a personal or family history of blood clots or blood clotting disease.  People who have peripheral vascular disease (PVD), diabetes, or some types of cancer.  People who have heart disease, especially if the person had a recent heart attack or has congestive heart failure.  People who have neurological diseases that affect the legs (leg paresis).  People who have had a traumatic injury, such as breaking a hip or leg.  People who have  recently had major or lengthy surgery, especially on the hip, knee, or abdomen.  People who have had a central line placed inside a large vein.  People who take medicines that contain the hormone estrogen. These include birth control pills and hormone replacement therapy.  Pregnancy or during childbirth or the postpartum period.  Long plane flights (over 8 hours). SIGNS AND SYMPTOMS Symptoms of a DVT can include:   Swelling of your leg or arm, especially if one side is much worse.  Warmth and redness of your leg or arm, especially if one side is much worse.  Pain in your arm or leg. If the clot is in your leg, symptoms may be more noticeable or worse when you stand or walk.  A feeling of pins and needles, if the clot is in the arm. The symptoms of a DVT that has traveled to the lungs (pulmonary embolism, PE) usually start suddenly and include:  Shortness of breath while active or at rest.  Coughing or coughing up blood or blood-tinged mucus.  Chest pain that is often worse with deep breaths.  Rapid or irregular heartbeat.  Feeling light-headed or dizzy.  Fainting.  Feeling anxious.  Sweating. There may also be pain and swelling in a leg if that is where the blood clot started. These symptoms may represent a serious problem that is an emergency. Do not wait to see if the symptoms will go away. Get medical help right  away. Call your local emergency services (911 in the [place] Do not drive yourself to the hospital. DIAGNOSIS Your health care provider will take a medical history and perform a physical exam. You may also have other tests, including:  Blood tests to assess the clotting properties of your blood.  Imaging tests, such as CT, ultrasound, MRI, X-ray, and other tests to see if you have clots anywhere in your body. TREATMENT After a DVT is identified, it can be treated. The type of treatment that you receive depends on many factors, such as the cause of your DVT, your  risk for bleeding or developing more clots, and other medical conditions that you have. Sometimes, a combination of treatments is necessary. Treatment options may be combined and include:  Monitoring the blood clot with ultrasound.  Taking medicines by mouth, such as newer blood thinners (anticoagulants), thrombolytics, or warfarin.  Taking anticoagulant medicine by injection or through an IV tube.  Wearing compression stockings or using different types ofdevices.  Surgery (rare) to remove the blood clot or to place a filter in your abdomen to stop the blood clot from traveling to your lungs. Treatments for a DVT are often divided into immediate treatment and long-term treatment (up to 3 months after DVT). You can work with your health care provider to choose the treatment program that is best for you. HOME CARE INSTRUCTIONS If you are taking a newer oral anticoagulant:  Take the medicine every single day at the same time each day.  Understand what foods and drugs interact with this medicine.  Understand that there are no regular blood tests required when using this medicine.  Understand the side effects of this medicine, including excessive bruising or bleeding. Ask your health care provider or pharmacist about other possible side effects. If you are taking warfarin:  Understand how to take warfarin and know which foods can affect how warfarin works in Veterinary surgeon.  Understand that it is dangerous to take too much or too little warfarin. Too much warfarin increases the risk of bleeding. Too little warfarin continues to allow the risk for blood clots.  Follow your PT and INR blood testing schedule. The PT and INR results allow your health care provider to adjust your dose of warfarin. It is very important that you have your PT and INR tested as often as told by your health care provider.  Avoid major changes in your diet, or tell your health care provider before you change your diet.  Arrange a visit with a registered dietitian to answer your questions. Many foods, especially foods that are high in vitamin K, can interfere with warfarin and affect the PT and INR results. Eat a consistent amount of foods that are high in vitamin K, such as:  Spinach, kale, broccoli, cabbage, collard greens, turnip greens, Brussels sprouts, peas, cauliflower, seaweed, and parsley.  Beef liver and pork liver.  Green tea.  Soybean oil.  Tell your health care provider about any and all medicines, vitamins, and supplements that you take, including aspirin and other over-the-counter anti-inflammatory medicines. Be especially cautious with aspirin and anti-inflammatory medicines. Do not take those before you ask your health care provider if it is safe to do so. This is important because many medicines can interfere with warfarin and affect the PT and INR results.  Do not start or stop taking any over-the-counter or prescription medicine unless your health care provider or pharmacist tells you to do so. If you take warfarin, you will also  need to do these things:  Hold pressure over cuts for longer than usual.  Tell your dentist and other health care providers that you are taking warfarin before you have any procedures in which bleeding may occur.  Avoid alcohol or drink very small amounts. Tell your health care provider if you change your alcohol intake.  Do not use tobacco products, including cigarettes, chewing tobacco, and e-cigarettes. If you need help quitting, ask your health care provider.  Avoid contact sports. General Instructions  Take over-the-counter and prescription medicines only as told by your health care provider. Anticoagulant medicines can have side effects, including easy bruising and difficulty stopping bleeding. If you are prescribed an anticoagulant, you will also need to do these things:  Hold pressure over cuts for longer than usual.  Tell your dentist and other  health care providers that you are taking anticoagulants before you have any procedures in which bleeding may occur.  Avoid contact sports.  Wear a medical alert bracelet or carry a medical alert card that says you have had a PE.  Ask your health care provider how soon you can go back to your normal activities. Stay active to prevent new blood clots from forming.  Make sure to exercise while traveling or when you have been sitting or standing for a long period of time. It is very important to exercise. Exercise your legs by walking or by tightening and relaxing your leg muscles often. Take frequent walks.  Wear compression stockings as told by your health care provider to help prevent more blood clots from forming.  Do not use tobacco products, including cigarettes, chewing tobacco, and e-cigarettes. If you need help quitting, ask your health care provider.  Keep all follow-up appointments with your health care provider. This is important. PREVENTION Take these actions to decrease your risk of developing another DVT:  Exercise regularly. For at least 30 minutes every day, engage in:  Activity that involves moving your arms and legs.  Activity that encourages good blood flow through your body by increasing your heart rate.  Exercise your arms and legs every hour during long-distance travel (over 4 hours). Drink plenty of water and avoid drinking alcohol while traveling.  Avoid sitting or lying in bed for long periods of time without moving your legs.  Maintain a weight that is appropriate for your height. Ask your health care provider what weight is healthy for you.  If you are a woman who is over 7 years of age, avoid unnecessary use of medicines that contain estrogen. These include birth control pills.  Do not smoke, especially if you take estrogen medicines. If you need help quitting, ask your health care provider. If you are hospitalized, prevention measures may include:  Early  walking after surgery, as soon as your health care provider says that it is safe.  Receiving anticoagulants to prevent blood clots.If you cannot take anticoagulants, other options may be available, such as wearing compression stockings or using different types of devices. SEEK IMMEDIATE MEDICAL CARE IF:  You have new or increased pain, swelling, or redness in an arm or leg.  You have numbness or tingling in an arm or leg.  You have shortness of breath while active or at rest.  You have chest pain.  You have a rapid or irregular heartbeat.  You feel light-headed or dizzy.  You cough up blood.  You notice blood in your vomit, bowel movement, or urine. These symptoms may represent a serious problem that is  an emergency. Do not wait to see if the symptoms will go away. Get medical help right away. Call your local emergency services (911 in the [place] Do not drive yourself to the hospital.   This information is not intended to replace advice given to you by your health care provider. Make sure you discuss any questions you have with your health care provider.   Document Released: 02/26/2005 Document Revised: 11/17/2014 Document Reviewed: 06/23/2014 Elsevier Interactive Patient Education 2016 [employer]         Edema Edema is an abnormal buildup of fluids in your bodytissues. Edema is somewhatdependent on gravity to pull the fluid to the lowest place in your body. That makes the condition more common in the legs and thighs (lower extremities). Painless swelling of the feet and ankles is common and becomes more likely as you get older. It is also common in looser tissues, like around your eyes.  When the affected area is squeezed, the fluid may move out of that spot and leave a dent for a few moments. This dent is called pitting.  CAUSES  There are many possible causes of edema. Eating too much salt and being on your feet or sitting for a long time can cause edema in your legs  and ankles. Hot weather may make edema worse. Common medical causes of edema include:  Heart failure.  Liver disease.  Kidney disease.  Weak blood vessels in your legs.  Cancer.  An injury.  Pregnancy.  Some medications.  Obesity. SYMPTOMS  Edema is usually painless.Your skin may look swollen or shiny.  DIAGNOSIS  Your health care provider may be able to diagnose edema by asking about your medical history and doing a physical exam. You may need to have tests such as X-rays, an electrocardiogram, or blood tests to check for medical conditions that may cause edema.  TREATMENT  Edema treatment depends on the cause. If you have heart, liver, or kidney disease, you need the treatment appropriate for these conditions. General treatment may include:  Elevation of the affected body part above the level of your heart.  Compression of the affected body part. Pressure from elastic bandages or support stockings squeezes the tissues and forces fluid back into the blood vessels. This keeps fluid from entering the tissues.  Restriction of fluid and salt intake.  Use of a water pill (diuretic). These medications are appropriate only for some types of edema. They pull fluid out of your body and make you urinate more often. This gets rid of fluid and reduces swelling, but diuretics can have side effects. Only use diuretics as directed by your health care provider. HOME CARE INSTRUCTIONS   Keep the affected body part above the level of your heart when you are lying down.   Do not sit still or stand for prolonged periods.   Do not put anything directly under your knees when lying down.  Do not wear constricting clothing or garters on your upper legs.   Exercise your legs to work the fluid back into your blood vessels. This may help the swelling go down.   Wear elastic bandages or support stockings to reduce ankle swelling as directed by your health care provider.   Eat a low-salt  diet to reduce fluid if your health care provider recommends it.   Only take medicines as directed by your health care provider. SEEK MEDICAL CARE IF:   Your edema is not responding to treatment.  You have heart, liver, or  kidney disease and notice symptoms of edema.  You have edema in your legs that does not improve after elevating them.   You have sudden and unexplained weight gain. SEEK IMMEDIATE MEDICAL CARE IF:   You develop shortness of breath or chest pain.   You cannot breathe when you lie down.  You develop pain, redness, or warmth in the swollen areas.   You have heart, liver, or kidney disease and suddenly get edema.  You have a fever and your symptoms suddenly get worse. MAKE SURE YOU:   Understand these instructions.  Will watch your condition.  Will get help right away if you are not doing well or get worse.   This information is not intended to replace advice given to you by your health care provider. Make sure you discuss any questions you have with your health care provider.   Document Released: 02/26/2005 Document Revised: 03/19/2014 Document Reviewed: 12/19/2012 Elsevier Interactive Patient Education [employer].

## 2015-04-24 LAB — CBC WITH DIFFERENTIAL/PLATELET
Basophils Absolute: 0 10*3/uL (ref 0.0–0.1)
Basophils Relative: 0.3 % (ref 0.0–3.0)
EOS PCT: 0.4 % (ref 0.0–5.0)
Eosinophils Absolute: 0 10*3/uL (ref 0.0–0.7)
HCT: 44.8 % (ref 39.0–52.0)
Hemoglobin: 14.9 g/dL (ref 13.0–17.0)
LYMPHS ABS: 2.8 10*3/uL (ref 0.7–4.0)
Lymphocytes Relative: 27.4 % (ref 12.0–46.0)
MCHC: 33.3 g/dL (ref 30.0–36.0)
MCV: 98.9 fl (ref 78.0–100.0)
MONO ABS: 0.4 10*3/uL (ref 0.1–1.0)
MONOS PCT: 4.3 % (ref 3.0–12.0)
NEUTROS ABS: 7 10*3/uL (ref 1.4–7.7)
NEUTROS PCT: 67.6 % (ref 43.0–77.0)
PLATELETS: 265 10*3/uL (ref 150.0–400.0)
RBC: 4.53 Mil/uL (ref 4.22–5.81)
RDW: 12.8 % (ref 11.5–15.5)
WBC: 10.3 10*3/uL (ref 4.0–10.5)

## 2015-04-25 ENCOUNTER — Telehealth: Payer: Self-pay

## 2015-04-25 ENCOUNTER — Telehealth: Payer: Self-pay | Admitting: Family Medicine

## 2015-04-25 NOTE — Telephone Encounter (Signed)
Pt is scheduled for 04/26/15

## 2015-04-25 NOTE — Telephone Encounter (Signed)
Left a message on home/cell for the pt to call and make appt for mid week.  Troy [name] please make sure he can get appt for next week sometime mid week ok Of doing well

## 2015-04-25 NOTE — Telephone Encounter (Signed)
PA submitted for patient's Eliquis - PA was approved.

## 2015-04-26 ENCOUNTER — Encounter: Payer: Self-pay | Admitting: Family Medicine

## 2015-04-26 ENCOUNTER — Ambulatory Visit (INDEPENDENT_AMBULATORY_CARE_PROVIDER_SITE_OTHER): Payer: Managed Care, Other (non HMO) | Admitting: Internal Medicine

## 2015-04-26 ENCOUNTER — Other Ambulatory Visit: Payer: Self-pay | Admitting: Internal Medicine

## 2015-04-26 ENCOUNTER — Encounter: Payer: Self-pay | Admitting: Internal Medicine

## 2015-04-26 VITALS — BP 118/72 | HR 91 | Temp 98.6°F | Wt 303.7 lb

## 2015-04-26 DIAGNOSIS — E1165 Type 2 diabetes mellitus with hyperglycemia: Secondary | ICD-10-CM | POA: Diagnosis not present

## 2015-04-26 DIAGNOSIS — IMO0001 Reserved for inherently not codable concepts without codable children: Secondary | ICD-10-CM

## 2015-04-26 DIAGNOSIS — I82411 Acute embolism and thrombosis of right femoral vein: Secondary | ICD-10-CM | POA: Diagnosis not present

## 2015-04-26 DIAGNOSIS — R609 Edema, unspecified: Secondary | ICD-10-CM

## 2015-04-26 DIAGNOSIS — I82401 Acute embolism and thrombosis of unspecified deep veins of right lower extremity: Secondary | ICD-10-CM | POA: Diagnosis not present

## 2015-04-26 DIAGNOSIS — Z79899 Other long term (current) drug therapy: Secondary | ICD-10-CM

## 2015-04-26 NOTE — Patient Instructions (Addendum)
Off work  Until Sunday  Feb 19th  Call end of week about how doing .  rov in 1- 2 week   Stay on anticoagulant   See dr Darnell Level for your diabetes . \ Decision on length of anticoagulation . This is the second episode  . Trigger i suppose is traveling and hx of same leg .   Deep Vein Thrombosis A deep vein thrombosis (DVT) is a blood clot (thrombus) that usually occurs in a deep, larger vein of the lower leg or the pelvis, or in an upper extremity such as the arm. These are dangerous and can lead to serious and even life-threatening complications if the clot travels to the lungs. A DVT can damage the valves in your leg veins so that instead of flowing upward, the blood pools in the lower leg. This is called post-thrombotic syndrome, and it can result in pain, swelling, discoloration, and sores on the leg. CAUSES A DVT is caused by the formation of a blood clot in your leg, pelvis, or arm. Usually, several things contribute to the formation of blood clots. A clot may develop when:  Your blood flow slows down.  Your vein becomes damaged in some way.  You have a condition that makes your blood clot more easily. RISK FACTORS A DVT is more likely to develop in:  People who are older, especially over 64 years of age.  People who are overweight (obese).  People who sit or lie still for a long time, such as during long-distance travel (over 4 hours), bed rest, hospitalization, or during recovery from certain medical conditions like a stroke.  People who do not engage in much physical activity (sedentary lifestyle).  People who have chronic breathing disorders.  People who have a personal or family history of blood clots or blood clotting disease.  People who have peripheral vascular disease (PVD), diabetes, or some types of cancer.  People who have heart disease, especially if the person had a recent heart attack or has congestive heart failure.  People who have neurological diseases that  affect the legs (leg paresis).  People who have had a traumatic injury, such as breaking a hip or leg.  People who have recently had major or lengthy surgery, especially on the hip, knee, or abdomen.  People who have had a central line placed inside a large vein.  People who take medicines that contain the hormone estrogen. These include birth control pills and hormone replacement therapy.  Pregnancy or during childbirth or the postpartum period.  Long plane flights (over 8 hours). SIGNS AND SYMPTOMS Symptoms of a DVT can include:   Swelling of your leg or arm, especially if one side is much worse.  Warmth and redness of your leg or arm, especially if one side is much worse.  Pain in your arm or leg. If the clot is in your leg, symptoms may be more noticeable or worse when you stand or walk.  A feeling of pins and needles, if the clot is in the arm. The symptoms of a DVT that has traveled to the lungs (pulmonary embolism, PE) usually start suddenly and include:  Shortness of breath while active or at rest.  Coughing or coughing up blood or blood-tinged mucus.  Chest pain that is often worse with deep breaths.  Rapid or irregular heartbeat.  Feeling light-headed or dizzy.  Fainting.  Feeling anxious.  Sweating. There may also be pain and swelling in a leg if that is where the  blood clot started. These symptoms may represent a serious problem that is an emergency. Do not wait to see if the symptoms will go away. Get medical help right away. Call your local emergency services (911 in the [place] Do not drive yourself to the hospital. DIAGNOSIS Your health care provider will take a medical history and perform a physical exam. You may also have other tests, including:  Blood tests to assess the clotting properties of your blood.  Imaging tests, such as CT, ultrasound, MRI, X-ray, and other tests to see if you have clots anywhere in your body. TREATMENT After a DVT is  identified, it can be treated. The type of treatment that you receive depends on many factors, such as the cause of your DVT, your risk for bleeding or developing more clots, and other medical conditions that you have. Sometimes, a combination of treatments is necessary. Treatment options may be combined and include:  Monitoring the blood clot with ultrasound.  Taking medicines by mouth, such as newer blood thinners (anticoagulants), thrombolytics, or warfarin.  Taking anticoagulant medicine by injection or through an IV tube.  Wearing compression stockings or using different types ofdevices.  Surgery (rare) to remove the blood clot or to place a filter in your abdomen to stop the blood clot from traveling to your lungs. Treatments for a DVT are often divided into immediate treatment and long-term treatment (up to 3 months after DVT). You can work with your health care provider to choose the treatment program that is best for you. HOME CARE INSTRUCTIONS If you are taking a newer oral anticoagulant:  Take the medicine every single day at the same time each day.  Understand what foods and drugs interact with this medicine.  Understand that there are no regular blood tests required when using this medicine.  Understand the side effects of this medicine, including excessive bruising or bleeding. Ask your health care provider or pharmacist about other possible side effects. If you are taking warfarin:  Understand how to take warfarin and know which foods can affect how warfarin works in Veterinary surgeon.  Understand that it is dangerous to take too much or too little warfarin. Too much warfarin increases the risk of bleeding. Too little warfarin continues to allow the risk for blood clots.  Follow your PT and INR blood testing schedule. The PT and INR results allow your health care provider to adjust your dose of warfarin. It is very important that you have your PT and INR tested as often as told by  your health care provider.  Avoid major changes in your diet, or tell your health care provider before you change your diet. Arrange a visit with a registered dietitian to answer your questions. Many foods, especially foods that are high in vitamin K, can interfere with warfarin and affect the PT and INR results. Eat a consistent amount of foods that are high in vitamin K, such as:  Spinach, kale, broccoli, cabbage, collard greens, turnip greens, Brussels sprouts, peas, cauliflower, seaweed, and parsley.  Beef liver and pork liver.  Green tea.  Soybean oil.  Tell your health care provider about any and all medicines, vitamins, and supplements that you take, including aspirin and other over-the-counter anti-inflammatory medicines. Be especially cautious with aspirin and anti-inflammatory medicines. Do not take those before you ask your health care provider if it is safe to do so. This is important because many medicines can interfere with warfarin and affect the PT and INR results.  Do  not start or stop taking any over-the-counter or prescription medicine unless your health care provider or pharmacist tells you to do so. If you take warfarin, you will also need to do these things:  Hold pressure over cuts for longer than usual.  Tell your dentist and other health care providers that you are taking warfarin before you have any procedures in which bleeding may occur.  Avoid alcohol or drink very small amounts. Tell your health care provider if you change your alcohol intake.  Do not use tobacco products, including cigarettes, chewing tobacco, and e-cigarettes. If you need help quitting, ask your health care provider.  Avoid contact sports. General Instructions  Take over-the-counter and prescription medicines only as told by your health care provider. Anticoagulant medicines can have side effects, including easy bruising and difficulty stopping bleeding. If you are prescribed an  anticoagulant, you will also need to do these things:  Hold pressure over cuts for longer than usual.  Tell your dentist and other health care providers that you are taking anticoagulants before you have any procedures in which bleeding may occur.  Avoid contact sports.  Wear a medical alert bracelet or carry a medical alert card that says you have had a PE.  Ask your health care provider how soon you can go back to your normal activities. Stay active to prevent new blood clots from forming.  Make sure to exercise while traveling or when you have been sitting or standing for a long period of time. It is very important to exercise. Exercise your legs by walking or by tightening and relaxing your leg muscles often. Take frequent walks.  Wear compression stockings as told by your health care provider to help prevent more blood clots from forming.  Do not use tobacco products, including cigarettes, chewing tobacco, and e-cigarettes. If you need help quitting, ask your health care provider.  Keep all follow-up appointments with your health care provider. This is important. PREVENTION Take these actions to decrease your risk of developing another DVT:  Exercise regularly. For at least 30 minutes every day, engage in:  Activity that involves moving your arms and legs.  Activity that encourages good blood flow through your body by increasing your heart rate.  Exercise your arms and legs every hour during long-distance travel (over 4 hours). Drink plenty of water and avoid drinking alcohol while traveling.  Avoid sitting or lying in bed for long periods of time without moving your legs.  Maintain a weight that is appropriate for your height. Ask your health care provider what weight is healthy for you.  If you are a woman who is over 19 years of age, avoid unnecessary use of medicines that contain estrogen. These include birth control pills.  Do not smoke, especially if you take estrogen  medicines. If you need help quitting, ask your health care provider. If you are hospitalized, prevention measures may include:  Early walking after surgery, as soon as your health care provider says that it is safe.  Receiving anticoagulants to prevent blood clots.If you cannot take anticoagulants, other options may be available, such as wearing compression stockings or using different types of devices. SEEK IMMEDIATE MEDICAL CARE IF:  You have new or increased pain, swelling, or redness in an arm or leg.  You have numbness or tingling in an arm or leg.  You have shortness of breath while active or at rest.  You have chest pain.  You have a rapid or irregular heartbeat.  You  feel light-headed or dizzy.  You cough up blood.  You notice blood in your vomit, bowel movement, or urine. These symptoms may represent a serious problem that is an emergency. Do not wait to see if the symptoms will go away. Get medical help right away. Call your local emergency services (911 in the [place] Do not drive yourself to the hospital.   This information is not intended to replace advice given to you by your health care provider. Make sure you discuss any questions you have with your health care provider.   Document Released: 02/26/2005 Document Revised: 11/17/2014 Document Reviewed: 06/23/2014 [employer] Interactive Patient Education [employer].

## 2015-04-26 NOTE — Progress Notes (Signed)
Pre visit review using our clinic review tool, if applicable. No additional management support is needed unless otherwise documented below in the visit note.   Chief Complaint  Patient presents with  . Follow-up    acute dvt    HPI: Troy [name] 45 y.o. comes in for fu after dc of acute dvt rle last Friday evening .  Since then he began eliquis  Loading without bleeding or se.    Worked 2 days 9-10 hours on line with leg up on a chair  No sever pain  No change in swelling    Some redness .  insurance required  Pre authorization but it was a weekend so only got 7 days  Until  approved pharmacist said could use coupon retro of needed  ROS: See pertinent positives and negatives per HPI. No cp sob falling new sx   Past Medical History  Diagnosis Date  . DVT (deep venous thrombosis) ([place])   . OSA (obstructive sleep apnea)   . Diabetes insipidus ([place])   . Obesity   . Arm weakness     right   . Diabetes mellitus without complication ([place])   . Asthma     Family History  Problem Relation Age of Onset  . Heart disease Mother   . Other Mother     clotting disorders  . Diabetes Mother   . Hypertension    . Allergies    . Deep vein thrombosis    . Sleep apnea    . Obesity    . Other Father     sudden death/cervical and lumbar disk disease  . Aneurysm Father     In his 66s smoked  . Hyperlipidemia Father     Social History   Social History  . Marital Status: Married    Spouse Name: N/A  . Number of Children: N/A  . Years of Education: N/A   Occupational History  . 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 Smoking status: Never Smoker   . Smokeless tobacco: None  . Alcohol Use: Yes     Comment: 1 drink mo.  . Drug Use: No  . Sexual Activity: Yes   Other Topics Concern  . None   Social History Narrative   Occupation:  days Time [name]   Worked 11- 8 am  Now changed job and shift to [employer] through Thursday    Working 10 hours days  recently  [employer] [address].    Married '10 10 10   ' Wife s/p bariatric surgery pt   Regular exercise- yes   Pt does have children   Just moved to 4 r house up and down stairs   Father died suddenly 05/19/08   Daily caffeine use one a day   2 dogs and cat.                 Outpatient Prescriptions Prior to Visit  Medication Sig Dispense Refill  . albuterol (PROVENTIL HFA;VENTOLIN HFA) 108 (90 BASE) MCG/ACT inhaler Inhale 2 puffs into the lungs every 6 (six) hours as needed.      [name] apixaban (ELIQUIS) 5 MG TABS tablet 10 mg orally twice daily for  7 days ; after 7 days, 5 mg orally twice daily 70 tablet 1  . glucose blood test strip Use as instructed 100 each 12  . glucose blood test strip Use as instructed 100 each 12  . Insulin Disposable Pump (V-GO 40) KIT USE  ONCE DAILY 90 kit 0  . insulin lispro (HUMALOG) 100 UNIT/ML injection Use up to 80 units daily. To use with Vgo 40 system 90 mL 3  . metFORMIN (GLUCOPHAGE-XR) 500 MG 24 hr tablet TAKE 4 TABLETS (2000 MG) DAILY WITH BREAKFAST 360 tablet 0   No facility-administered medications prior to visit.     EXAM:  BP 118/72 mmHg  Pulse 91  Temp(Src) 98.6 F (37 C) (Oral)  Wt 303 lb 11.2 oz (137.757 kg)  SpO2 98%  Body mass index is 42.38 kg/(m^2).  GENERAL: vitals reviewed and listed above, alert, oriented, appears well hydrated and in no acute distress HEENT: atraumatic, conjunctiva  clear, no obvious abnormalities on inspection of external nose and ears  LUNGS: clear to auscultation bilaterally, no wheezes, rales or rhonchi, good air movement CV: HRRR, no clubbing cyanosis  RLE  Swelling about the same slightly less but now diffusely pink  Distally  No ulceration  No cords or streaks   MS: moves all extremities without noticeable focal  abnormality PSYCH: pleasant and cooperative, no obvious depression or anxiety Lab Results  Component Value Date   WBC 10.3 04/22/2015   HGB 14.9 04/22/2015   HCT 44.8 04/22/2015   PLT 265.0  04/22/2015   GLUCOSE 359* 04/22/2015   CHOL 185 03/16/2013   TRIG 592.0* 03/16/2013   HDL 30.70* 03/16/2013   LDLDIRECT 70.5 03/16/2013   LDLCALC 107* 07/29/2009   ALT 27 03/16/2013   AST 19 03/16/2013   NA 137 04/22/2015   K 4.9 04/22/2015   CL 100 04/22/2015   CREATININE 0.67 04/22/2015   BUN 11 04/22/2015   CO2 29 04/22/2015   TSH 0.51 03/16/2013   HGBA1C 10.0* 10/08/2013   MICROALBUR 3.5* 03/16/2013    ASSESSMENT AND PLAN:  Discussed the following assessment and plan:  Acute deep vein thrombosis (DVT) of femoral vein of right lower extremity ([name]) - partial politeal   DVT (deep venous thrombosis), right - femoral  thrombosed and partial thrombosed  right popliteal .  Uncontrolled diabetes mellitus type 2 without complications, unspecified long term insulin use status ([name]) - disc  results  get back with  endocrinology  continue medication   Coupon given  disconcerting that  Insurance do a PA and has to  approve  Anticoagulant that helps avoid emergent hospitalization in a safe manner  On a weekend   . Kudos to the pharmacist who tried to get it approved . NO work for 3-5 days with better  elevation  To help acute edema . Patient agrees. The is second episode of acute dvt  rle  See past hx  Mom had hx of dvt and pe  Also  ? If aggravating factors See below instruction 1-2 weeks or asneeded -Patient advised to return or notify health care team  if symptoms worsen ,persist or new concerns arise.  Patient Instructions  Off work  Until Sunday  Feb 19th  Call end of week about how doing .  rov in 1- 2 week   Stay on anticoagulant   See dr Darnell Level for your diabetes . \ Decision on length of anticoagulation . This is the second episode  . Trigger i suppose is traveling and hx of same leg .   Deep Vein Thrombosis A deep vein thrombosis (DVT) is a blood clot (thrombus) that usually occurs in a deep, larger vein of the lower leg or the pelvis, or in an upper extremity such as the  arm. These are dangerous  and can lead to serious and even life-threatening complications if the clot travels to the lungs. A DVT can damage the valves in your leg veins so that instead of flowing upward, the blood pools in the lower leg. This is called post-thrombotic syndrome, and it can result in pain, swelling, discoloration, and sores on the leg. CAUSES A DVT is caused by the formation of a blood clot in your leg, pelvis, or arm. Usually, several things contribute to the formation of blood clots. A clot may develop when:  Your blood flow slows down.  Your vein becomes damaged in some way.  You have a condition that makes your blood clot more easily. RISK FACTORS A DVT is more likely to develop in:  People who are older, especially over 80 years of age.  People who are overweight (obese).  People who sit or lie still for a long time, such as during long-distance travel (over 4 hours), bed rest, hospitalization, or during recovery from certain medical conditions like a stroke.  People who do not engage in much physical activity (sedentary lifestyle).  People who have chronic breathing disorders.  People who have a personal or family history of blood clots or blood clotting disease.  People who have peripheral vascular disease (PVD), diabetes, or some types of cancer.  People who have heart disease, especially if the person had a recent heart attack or has congestive heart failure.  People who have neurological diseases that affect the legs (leg paresis).  People who have had a traumatic injury, such as breaking a hip or leg.  People who have recently had major or lengthy surgery, especially on the hip, knee, or abdomen.  People who have had a central line placed inside a large vein.  People who take medicines that contain the hormone estrogen. These include birth control pills and hormone replacement therapy.  Pregnancy or during childbirth or the postpartum period.  Long  plane flights (over 8 hours). SIGNS AND SYMPTOMS Symptoms of a DVT can include:   Swelling of your leg or arm, especially if one side is much worse.  Warmth and redness of your leg or arm, especially if one side is much worse.  Pain in your arm or leg. If the clot is in your leg, symptoms may be more noticeable or worse when you stand or walk.  A feeling of pins and needles, if the clot is in the arm. The symptoms of a DVT that has traveled to the lungs (pulmonary embolism, PE) usually start suddenly and include:  Shortness of breath while active or at rest.  Coughing or coughing up blood or blood-tinged mucus.  Chest pain that is often worse with deep breaths.  Rapid or irregular heartbeat.  Feeling light-headed or dizzy.  Fainting.  Feeling anxious.  Sweating. There may also be pain and swelling in a leg if that is where the blood clot started. These symptoms may represent a serious problem that is an emergency. Do not wait to see if the symptoms will go away. Get medical help right away. Call your local emergency services (911 in the [place] Do not drive yourself to the hospital. DIAGNOSIS Your health care provider will take a medical history and perform a physical exam. You may also have other tests, including:  Blood tests to assess the clotting properties of your blood.  Imaging tests, such as CT, ultrasound, MRI, X-ray, and other tests to see if you have clots anywhere in your body. TREATMENT After a  DVT is identified, it can be treated. The type of treatment that you receive depends on many factors, such as the cause of your DVT, your risk for bleeding or developing more clots, and other medical conditions that you have. Sometimes, a combination of treatments is necessary. Treatment options may be combined and include:  Monitoring the blood clot with ultrasound.  Taking medicines by mouth, such as newer blood thinners (anticoagulants), thrombolytics, or  warfarin.  Taking anticoagulant medicine by injection or through an IV tube.  Wearing compression stockings or using different types ofdevices.  Surgery (rare) to remove the blood clot or to place a filter in your abdomen to stop the blood clot from traveling to your lungs. Treatments for a DVT are often divided into immediate treatment and long-term treatment (up to 3 months after DVT). You can work with your health care provider to choose the treatment program that is best for you. HOME CARE INSTRUCTIONS If you are taking a newer oral anticoagulant:  Take the medicine every single day at the same time each day.  Understand what foods and drugs interact with this medicine.  Understand that there are no regular blood tests required when using this medicine.  Understand the side effects of this medicine, including excessive bruising or bleeding. Ask your health care provider or pharmacist about other possible side effects. If you are taking warfarin:  Understand how to take warfarin and know which foods can affect how warfarin works in Veterinary surgeon.  Understand that it is dangerous to take too much or too little warfarin. Too much warfarin increases the risk of bleeding. Too little warfarin continues to allow the risk for blood clots.  Follow your PT and INR blood testing schedule. The PT and INR results allow your health care provider to adjust your dose of warfarin. It is very important that you have your PT and INR tested as often as told by your health care provider.  Avoid major changes in your diet, or tell your health care provider before you change your diet. Arrange a visit with a registered dietitian to answer your questions. Many foods, especially foods that are high in vitamin K, can interfere with warfarin and affect the PT and INR results. Eat a consistent amount of foods that are high in vitamin K, such as:  Spinach, kale, broccoli, cabbage, collard greens, turnip greens,  Brussels sprouts, peas, cauliflower, seaweed, and parsley.  Beef liver and pork liver.  Green tea.  Soybean oil.  Tell your health care provider about any and all medicines, vitamins, and supplements that you take, including aspirin and other over-the-counter anti-inflammatory medicines. Be especially cautious with aspirin and anti-inflammatory medicines. Do not take those before you ask your health care provider if it is safe to do so. This is important because many medicines can interfere with warfarin and affect the PT and INR results.  Do not start or stop taking any over-the-counter or prescription medicine unless your health care provider or pharmacist tells you to do so. If you take warfarin, you will also need to do these things:  Hold pressure over cuts for longer than usual.  Tell your dentist and other health care providers that you are taking warfarin before you have any procedures in which bleeding may occur.  Avoid alcohol or drink very small amounts. Tell your health care provider if you change your alcohol intake.  Do not use tobacco products, including cigarettes, chewing tobacco, and e-cigarettes. If you need help quitting, ask  your health care provider.  Avoid contact sports. General Instructions  Take over-the-counter and prescription medicines only as told by your health care provider. Anticoagulant medicines can have side effects, including easy bruising and difficulty stopping bleeding. If you are prescribed an anticoagulant, you will also need to do these things:  Hold pressure over cuts for longer than usual.  Tell your dentist and other health care providers that you are taking anticoagulants before you have any procedures in which bleeding may occur.  Avoid contact sports.  Wear a medical alert bracelet or carry a medical alert card that says you have had a PE.  Ask your health care provider how soon you can go back to your normal activities. Stay active  to prevent new blood clots from forming.  Make sure to exercise while traveling or when you have been sitting or standing for a long period of time. It is very important to exercise. Exercise your legs by walking or by tightening and relaxing your leg muscles often. Take frequent walks.  Wear compression stockings as told by your health care provider to help prevent more blood clots from forming.  Do not use tobacco products, including cigarettes, chewing tobacco, and e-cigarettes. If you need help quitting, ask your health care provider.  Keep all follow-up appointments with your health care provider. This is important. PREVENTION Take these actions to decrease your risk of developing another DVT:  Exercise regularly. For at least 30 minutes every day, engage in:  Activity that involves moving your arms and legs.  Activity that encourages good blood flow through your body by increasing your heart rate.  Exercise your arms and legs every hour during long-distance travel (over 4 hours). Drink plenty of water and avoid drinking alcohol while traveling.  Avoid sitting or lying in bed for long periods of time without moving your legs.  Maintain a weight that is appropriate for your height. Ask your health care provider what weight is healthy for you.  If you are a woman who is over 39 years of age, avoid unnecessary use of medicines that contain estrogen. These include birth control pills.  Do not smoke, especially if you take estrogen medicines. If you need help quitting, ask your health care provider. If you are hospitalized, prevention measures may include:  Early walking after surgery, as soon as your health care provider says that it is safe.  Receiving anticoagulants to prevent blood clots.If you cannot take anticoagulants, other options may be available, such as wearing compression stockings or using different types of devices. SEEK IMMEDIATE MEDICAL CARE IF:  You have new or  increased pain, swelling, or redness in an arm or leg.  You have numbness or tingling in an arm or leg.  You have shortness of breath while active or at rest.  You have chest pain.  You have a rapid or irregular heartbeat.  You feel light-headed or dizzy.  You cough up blood.  You notice blood in your vomit, bowel movement, or urine. These symptoms may represent a serious problem that is an emergency. Do not wait to see if the symptoms will go away. Get medical help right away. Call your local emergency services (911 in the [place] Do not drive yourself to the hospital.   This information is not intended to replace advice given to you by your health care provider. Make sure you discuss any questions you have with your health care provider.   Document Released: 02/26/2005 Document Revised: 11/17/2014 Document Reviewed:  06/23/2014 Elsevier Interactive Patient Education 2016 [place] Irena Gaydos M.D.

## 2015-04-27 ENCOUNTER — Ambulatory Visit (HOSPITAL_COMMUNITY)
Admission: RE | Admit: 2015-04-27 | Discharge: 2015-04-27 | Disposition: A | Discharge status: Home / Self Care | Payer: Managed Care, Other (non HMO) | Source: Ambulatory Visit | Attending: Internal Medicine | Admitting: Internal Medicine

## 2015-04-27 ENCOUNTER — Ambulatory Visit: Payer: Self-pay | Admitting: Pulmonary Disease

## 2015-04-27 ENCOUNTER — Other Ambulatory Visit: Payer: Self-pay | Admitting: Internal Medicine

## 2015-04-27 DIAGNOSIS — M79609 Pain in unspecified limb: Secondary | ICD-10-CM | POA: Diagnosis not present

## 2015-04-27 DIAGNOSIS — R609 Edema, unspecified: Secondary | ICD-10-CM

## 2015-04-27 DIAGNOSIS — M7989 Other specified soft tissue disorders: Secondary | ICD-10-CM | POA: Diagnosis not present

## 2015-04-28 DIAGNOSIS — I82411 Acute embolism and thrombosis of right femoral vein: Secondary | ICD-10-CM | POA: Insufficient documentation

## 2015-04-28 DIAGNOSIS — I82401 Acute embolism and thrombosis of unspecified deep veins of right lower extremity: Secondary | ICD-10-CM | POA: Insufficient documentation

## 2015-04-29 ENCOUNTER — Telehealth: Payer: Self-pay | Admitting: Internal Medicine

## 2015-04-29 NOTE — Telephone Encounter (Signed)
Pt.notified

## 2015-04-29 NOTE — Telephone Encounter (Signed)
No work   Until swelling starts to  goes down .   [name] write another  Note for him if needed

## 2015-04-29 NOTE — Telephone Encounter (Signed)
Pt called to advise his leg has not gotten any smaller.  Pt was advised to inform Dr Regis Bill today of his progress.

## 2015-05-03 ENCOUNTER — Telehealth: Payer: Self-pay | Admitting: Internal Medicine

## 2015-05-03 ENCOUNTER — Ambulatory Visit (HOSPITAL_COMMUNITY)
Admission: RE | Admit: 2015-05-03 | Discharge: 2015-05-03 | Disposition: A | Discharge status: Home / Self Care | Payer: Managed Care, Other (non HMO) | Source: Ambulatory Visit | Attending: Internal Medicine | Admitting: Internal Medicine

## 2015-05-03 ENCOUNTER — Encounter: Payer: Self-pay | Admitting: Internal Medicine

## 2015-05-03 ENCOUNTER — Ambulatory Visit (INDEPENDENT_AMBULATORY_CARE_PROVIDER_SITE_OTHER): Payer: Managed Care, Other (non HMO) | Admitting: Internal Medicine

## 2015-05-03 VITALS — BP 114/82 | Temp 97.8°F | Wt 295.4 lb

## 2015-05-03 DIAGNOSIS — M79604 Pain in right leg: Secondary | ICD-10-CM

## 2015-05-03 DIAGNOSIS — I82411 Acute embolism and thrombosis of right femoral vein: Secondary | ICD-10-CM

## 2015-05-03 DIAGNOSIS — Z23 Encounter for immunization: Secondary | ICD-10-CM | POA: Diagnosis not present

## 2015-05-03 DIAGNOSIS — E119 Type 2 diabetes mellitus without complications: Secondary | ICD-10-CM | POA: Diagnosis not present

## 2015-05-03 NOTE — Patient Instructions (Signed)
Fu depending on  How doing . And results of doppler Next week

## 2015-05-03 NOTE — Telephone Encounter (Addendum)
Pt had ultrasound of legs and would like to know how long should he be out of work. Pt also would like to know when should her follow up with dr Regis Bill.

## 2015-05-03 NOTE — Telephone Encounter (Signed)
[facility] is calling to advise Dr Regis Bill pt's DVT has expanded and is now in the popliteal vein. Pt needs to know what to do. [facility] is calling. Was able to transfer to [facility]

## 2015-05-03 NOTE — Telephone Encounter (Signed)
Spoke to the pt.  Informed him of no change in DVT.  Stay on Eliquis bid.  No work until evaluated again.  Has made an appt on 05/10/15 @ 1:15 PM.  He will call [facility] or go to ED if pain/swelling worsens.

## 2015-05-03 NOTE — Telephone Encounter (Signed)
On comparison of  Leg doppler     There was no change or extension of the clot . alos disc situation with colleague   . We agree  That we should .  Stay on the eliquis  Twice a day         No work  For at least another week     Let me check   You  In office  In about 7-10 days then decide .   If swelling increases or  Increased pain contact us  Or ed eval but  dont expect that to happen.

## 2015-05-03 NOTE — Progress Notes (Signed)
Pre visit review using our clinic review tool, if applicable. No additional management support is needed unless otherwise documented below in the visit note.  Chief Complaint  Patient presents with  . Follow-up    HPI: Troy [name]  45 y.o.  comin in for fu of  Acute  dvt now on med for  10 day s.   The first day he did not use the higher dose but then took the whole 7 days of double dose of [place]'s. He is now on 5 mg twice a day.  The swelling he states hasn't gone down much but he has had 2-3 days of discomfort pain posterior calf level 1-2 which he didn't have the beginning of treatment. No increase in redness all the symptoms are below the knee. No resp sx  We took him out of work  When swelling didn't go down after a few days of rx  . Elevating and rest at home No new numbness  Over his baseline neuropathy   Asks for lfuvaccine  Didn't get done last visit  fmla papers to do for work  ROS: See pertinent positives and negatives per HPI.  Past Medical History  Diagnosis Date  . DVT (deep venous thrombosis) ([place])   . OSA (obstructive sleep apnea)   . Diabetes insipidus ([place])   . Obesity   . Arm weakness     right   . Diabetes mellitus without complication ([place])   . Asthma     Family History  Problem Relation Age of Onset  . Heart disease Mother   . Other Mother     clotting disorders  . Diabetes Mother   . Hypertension    . Allergies    . Deep vein thrombosis    . Sleep apnea    . Obesity    . Other Father     sudden death/cervical and lumbar disk disease  . Aneurysm Father     In his 27s smoked  . Hyperlipidemia Father     Social History   Social History  . Marital Status: Married    Spouse Name: N/A  . Number of Children: N/A  . Years of Education: N/A   Occupational History  . 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 Smoking status: Never Smoker   . Smokeless tobacco: None  . Alcohol Use: Yes     Comment: 1 drink mo.    . Drug Use: No  . Sexual Activity: Yes   Other Topics Concern  . None   Social History Narrative   Occupation:  days Time [name]   Worked 11- 8 am  Now changed job and shift to [employer] through Thursday    Working 10 hours days recently  [employer] [address].    Married _0 Wife s/p bariatric surgery pt   Regular exercise- yes   Pt does have children   Just moved to 4 r house up and down stairs   Father died suddenly 05-07-08   Daily caffeine use one a day   2 dogs and cat.                 Outpatient Prescriptions Prior to Visit  Medication Sig Dispense Refill  . albuterol (PROVENTIL HFA;VENTOLIN HFA) 108 (90 BASE) MCG/ACT inhaler Inhale 2 puffs into the lungs every 6 (six) hours as needed.      [name] apixaban (ELIQUIS) 5 MG TABS tablet 10 mg orally  twice daily for  7 days ; after 7 days, 5 mg orally twice daily 70 tablet 1  . glucose blood test strip Use as instructed 100 each 12  . glucose blood test strip Use as instructed 100 each 12  . Insulin Disposable Pump (V-GO 40) KIT USE ONCE DAILY 90 kit 0  . insulin lispro (HUMALOG) 100 UNIT/ML injection Use up to 80 units daily. To use with Vgo 40 system 90 mL 3  . metFORMIN (GLUCOPHAGE-XR) 500 MG 24 hr tablet TAKE 4 TABLETS (2000 MG) DAILY WITH BREAKFAST 360 tablet 0   No facility-administered medications prior to visit.     EXAM:  BP 114/82 mmHg  Temp(Src) 97.8 F (36.6 C) (Oral)  Wt 295 lb 6.4 oz (133.993 kg)  Body mass index is 41.22 kg/(m^2).  GENERAL: vitals reviewed and listed above, alert, oriented, appears well hydrated and in no acute distress HEENT: atraumatic, conjunctiva  clear, no obvious abnormalities on inspection of external nose and ears OP : no lesion edema or exudate  NECK: no obvious masses on inspection palpation   legs  rle 2+ selling ankle and above to knee  Less tight  But prominent veins  Post  No cords   Anterior middle pink red  Without demarcation  More than left  ( slightly flushed also )  No  foot edema  Or pitting   ASSESSMENT AND PLAN:  Discussed the following assessment and plan:  Acute deep vein thrombosis (DVT) of femoral vein of right lower extremity ([place]) - Plan: LE VENOUS  Leg pain, right - Plan: LE VENOUS  Need for prophylactic vaccination and inoculation against influenza - Plan: Flu Vaccine QUAD 36+ mos PF IM (Fluarix & Fluzone Quad PF) Concern that  response not optimal  But underdosed the first day and   Not off work for 3 days or so .  cirulation looks adequate.  R/o expansion of clot    Etc   .   Doppler today   -Patient advised to return or notify health care team  if symptoms worsen ,persist or new concerns arise. fmla papers to review and complete  There are no Patient Instructions on file for this visit.   [name]. [name] M.D.

## 2015-05-10 ENCOUNTER — Encounter: Payer: Self-pay | Admitting: Internal Medicine

## 2015-05-10 ENCOUNTER — Ambulatory Visit (INDEPENDENT_AMBULATORY_CARE_PROVIDER_SITE_OTHER): Payer: Managed Care, Other (non HMO) | Admitting: Internal Medicine

## 2015-05-10 VITALS — BP 126/80 | HR 88 | Temp 97.8°F | Wt 298.7 lb

## 2015-05-10 DIAGNOSIS — I82411 Acute embolism and thrombosis of right femoral vein: Secondary | ICD-10-CM | POA: Diagnosis not present

## 2015-05-10 DIAGNOSIS — Z79899 Other long term (current) drug therapy: Secondary | ICD-10-CM | POA: Diagnosis not present

## 2015-05-10 NOTE — Progress Notes (Signed)
Pre visit review using our clinic review tool, if applicable. No additional management support is needed unless otherwise documented below in the visit note.  Chief Complaint  Patient presents with  . Follow-up    HPI: Troy [name] 45 y.o. comesin for fu of   dvt right  Femoral and par politeal   On eliquis   Had pain after onset of anticoagulant and advised to elevated and observe  For worsening  Last [name] shoes no change   He notices that the size of this leg waxes and wanes depending on if it is down with gravity. His mom is critically ill in the hospital he does go visit but can't stay long because of his leg swelling. He has some stinging pain at times in his calf but nothing that is long lasting nor limiting nor radiating. He is working on getting back in with the endocrinologist. But his mom is quite sick. There is no shortness of breath new redness. He continues to take anticoagulant. ROS: See pertinent positives and negatives per HPI. No bleeding  Past Medical History  Diagnosis Date  . DVT (deep venous thrombosis) ([place])   . OSA (obstructive sleep apnea)   . Diabetes insipidus ([place])   . Obesity   . Arm weakness     right   . Diabetes mellitus without complication ([place])   . Asthma     Family History  Problem Relation Age of Onset  . Heart disease Mother   . Other Mother     clotting disorders  . Diabetes Mother   . Hypertension    . Allergies    . Deep vein thrombosis    . Sleep apnea    . Obesity    . Other Father     sudden death/cervical and lumbar disk disease  . Aneurysm Father     In his 61s smoked  . Hyperlipidemia Father     Social History   Social History  . Marital Status: Married    Spouse Name: N/A  . Number of Children: N/A  . Years of Education: N/A   Occupational History  . 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 Smoking status: Never Smoker   . Smokeless tobacco: None  . Alcohol Use: Yes     Comment: 1  drink mo.  . Drug Use: No  . Sexual Activity: Yes   Other Topics Concern  . None   Social History Narrative   Occupation:  days Time [name]   Worked 11- 8 am  Now changed job and shift to [employer] through Thursday    Working 10 hours days recently  [employer].    Married _0 Wife s/p bariatric surgery pt   Regular exercise- yes   Pt does have children   Just moved to 4 r house up and down stairs   Father died suddenly 05-05-08   Daily caffeine use one a day   2 dogs and cat.                 Outpatient Prescriptions Prior to Visit  Medication Sig Dispense Refill  . albuterol (PROVENTIL HFA;VENTOLIN HFA) 108 (90 BASE) MCG/ACT inhaler Inhale 2 puffs into the lungs every 6 (six) hours as needed.      [name] apixaban (ELIQUIS) 5 MG TABS tablet 10 mg orally twice daily for  7 days ; after 7 days, 5 mg orally twice daily 70 tablet  1  . glucose blood test strip Use as instructed 100 each 12  . glucose blood test strip Use as instructed 100 each 12  . Insulin Disposable Pump (V-GO 40) KIT USE ONCE DAILY 90 kit 0  . insulin lispro (HUMALOG) 100 UNIT/ML injection Use up to 80 units daily. To use with Vgo 40 system 90 mL 3  . metFORMIN (GLUCOPHAGE-XR) 500 MG 24 hr tablet TAKE 4 TABLETS (2000 MG) DAILY WITH BREAKFAST 360 tablet 0   No facility-administered medications prior to visit.     EXAM:  BP 126/80 mmHg  Pulse 88  Temp(Src) 97.8 F (36.6 C) (Oral)  Wt 298 lb 11.2 oz (135.489 kg)  SpO2 98%  Body mass index is 41.68 kg/(m^2).  GENERAL: vitals reviewed and listed above, alert, oriented, appears well hydrated and in no acute distress HEENT: atraumatic, conjunctiva  clear, no obvious abnormalities on inspection of external nose and ears Right lower extremity much less tense than previous faded erythema prominent veins with some tenderness in the upper calf area no pitting. No edema above the knee.  PSYCH: pleasant and cooperative, no obvious depression or anxiety Wt  Readings from Last 3 Encounters:  05/10/15 298 lb 11.2 oz (135.489 kg)  05/03/15 295 lb 6.4 oz (133.993 kg)  04/26/15 303 lb 11.2 oz (137.757 kg)    ASSESSMENT AND PLAN:  Discussed the following assessment and plan:  Acute deep vein thrombosis (DVT) of femoral vein of right lower extremity ([name])  Medication management I suspect some of his discomfort and waxing and waning edema is related to the previous DVT TE had years ago and in the past as worn compression stockings on his feet are down. I think it is safe she is a compression stockings again if they fit and alcohol office circulation although it hasn't been shown to change the outcome if this blood clots. It may help him be more comfortable. I have written a note for return to work for tomorrow 05/11/2015 unless she has a problem. Stay on anticoagulant and come back in about 1-2 months. May need an opinion about length of anticoagulation suspect would need longer than normal even prolonged. He has a family history of blood clots in his mom and he has repeated clot and the same leg and there may be anatomic propensity. He has promised to get back with endocrinology and get his diabetes in order. -Patient advised to return or notify health care team  if symptoms worsen ,persist or new concerns arise.  Patient Instructions  Ok to use the compression .  Stocking  When leg down may help discomfort .  Stay in anticoagulant . ROV in  1-2 months or as needed . Attend to the diabetes . [name] Ok to go back to work at this time       [employer]. Jorryn Hershberger M.D.

## 2015-05-10 NOTE — Patient Instructions (Signed)
Ok to use the compression .  Stocking  When leg down may help discomfort .  Stay in anticoagulant . ROV in  1-2 months or as needed . Attend to the diabetes . [name] Ok to go back to work at this time

## 2015-05-18 ENCOUNTER — Other Ambulatory Visit: Payer: Self-pay | Admitting: Family Medicine

## 2015-07-17 NOTE — Progress Notes (Signed)
Document opened and reviewed for OV but appt  canceled same day . Forgot appt

## 2015-07-18 ENCOUNTER — Telehealth: Payer: Self-pay | Admitting: Family Medicine

## 2015-07-18 ENCOUNTER — Encounter: Payer: Managed Care, Other (non HMO) | Admitting: Internal Medicine

## 2015-07-18 NOTE — Telephone Encounter (Signed)
Spoke to the pt.  His work schedule has changed and pt forgot about appointment.  Will not charge for missed visit.

## 2015-08-02 ENCOUNTER — Encounter: Payer: Self-pay | Admitting: Pulmonary Disease

## 2015-08-09 NOTE — Progress Notes (Signed)
Chief Complaint  Patient presents with  . Follow-up    HPI: Troy [name] 45 y.o. a gentleman with diabetes and history of right lower extremity DVT on daily coagulation. This is his second episode DVT the first one after trauma. His leg is doing fine only occasional tenderness he is on Eliquis 5 mg twice a day but ran out was off for almost a month. He started it back up but needs a refill. He is lost his mom with severe significant illness and respiratory failure in the ICU march 2017 .Troy [name] This delayed some of his follow-up for his medical care. He has an appointment with his endocrinologist but hasn't been there in a most year. He is now working nights shifts 10-7 tolerating well but has to adjust his appointments in the day so he doesn't forget them. ROS: See pertinent positives and negatives per HPI. No cp sob new swelling  Eyes get dry on cpap if gest off face   Eye gel done help prev pulm was clance   To see dr Elsworth Soho . No bleeding  Past Medical History  Diagnosis Date  . DVT (deep venous thrombosis) ([place])   . OSA (obstructive sleep apnea)   . Diabetes insipidus ([place])   . Obesity   . Arm weakness     right   . Diabetes mellitus without complication ([place])   . Asthma     Family History  Problem Relation Age of Onset  . Heart disease Mother   . Other Mother     clotting disorders  . Diabetes Mother   . Hypertension    . Allergies    . Deep vein thrombosis    . Sleep apnea    . Obesity    . Other Father     sudden death/cervical and lumbar disk disease  . Aneurysm Father     In his 44s smoked  . Hyperlipidemia Father     Social History   Social History  . Marital Status: Married    Spouse Name: N/A  . Number of Children: N/A  . Years of Education: N/A   Occupational History  . 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 Smoking status: Never Smoker   . Smokeless tobacco: None  . Alcohol Use: Yes     Comment: 1 drink mo.  . Drug  Use: No  . Sexual Activity: Yes   Other Topics Concern  . None   Social History Narrative   Occupation:  days Time Troy [name]   Worked 11- 8 am  Now changed job and shift to [employer] through Thursday    Working 10 hours days recently  [employer] [address].    Married _0 Wife s/p bariatric surgery pt   Regular exercise- yes   Pt does have children   Just moved to 4 r house up and down stairs   Father died suddenly 2008/05/18   Daily caffeine use one a day   2 dogs and cat.                 Outpatient Prescriptions Prior to Visit  Medication Sig Dispense Refill  . albuterol (PROVENTIL HFA;VENTOLIN HFA) 108 (90 BASE) MCG/ACT inhaler Inhale 2 puffs into the lungs every 6 (six) hours as needed.      Troy [name] glucose blood test strip Use as instructed 100 each 12  . glucose blood test strip Use as instructed 100 each 12  .  Insulin Disposable Pump (V-GO 40) KIT USE ONCE DAILY 90 kit 0  . insulin lispro (HUMALOG) 100 UNIT/ML injection Use up to 80 units daily. To use with Vgo 40 system 90 mL 3  . metFORMIN (GLUCOPHAGE-XR) 500 MG 24 hr tablet TAKE 4 TABLETS (2000 MG) DAILY WITH BREAKFAST 360 tablet 0  . apixaban (ELIQUIS) 5 MG TABS tablet 10 mg orally twice daily for  7 days ; after 7 days, 5 mg orally twice daily 70 tablet 1   No facility-administered medications prior to visit.     EXAM:  BP 112/76 mmHg  Pulse 85  Temp(Src) 97.7 F (36.5 C) (Oral)  Ht _0  (1.803 m)  Wt 300 lb 9.6 oz (136.351 kg)  BMI 41.94 kg/m2  SpO2 98%  Body mass index is 41.94 kg/(m^2).  GENERAL: vitals reviewed and listed above, alert, oriented, appears well hydrated and in no acute distress HEENT: atraumatic, conjunctiva  Mildly red  clear, no obvious abnormalities on inspection of external nose and ears some redness  NECK: no obvious masses on inspection palpation  MS: moves all extremities without noticeable focal  Abnormality RLE  With  No tender ess vv seen and old skin changes laterally on right  PSYCH:  pleasant and cooperative, no obvious depression or anxiety  ASSESSMENT AND PLAN:  Discussed the following assessment and plan:  DVT (deep venous thrombosis), right - Plan: Ambulatory referral to Hematology  Family history of blood clots - mom had hx of PE.  x 2  - Plan: Ambulatory referral to Hematology  Medication management  Morbid obesity, unspecified obesity type ([place])  Uncontrolled diabetes mellitus type 2 without complications, unspecified long term insulin use status ([place])  Chronic anticoagulation History of DVT 2 right lower extremity. Family history of pulmonary embolus. Mom recently passed away with some type of complex respiratory failure vasculitis possible clotting. Would have him continue the Eliquis until further notice. Discussed consult with Dr. Nobie Putnam. And will have him follow-up in regard to his diabetes which is probably out of control this time. He is aware. He will get established with a new pulmonary.regard to his OSA and CPAP. We'll follow up at yearly in August. Let us know can help in the interim. -Patient advised to return or notify health care team  if symptoms worsen ,persist or new concerns arise.  Patient Instructions  Stay on the  eliquis   Until further notice   This is your second  dvt   Consideration of longer  Orcontinual   Anticoagulation.   We can get an opinon from  Hematology especially  with your moms hx of clotting  Fu with Dr Darnell Level as planned .   Keep  Annual appt.         [name]. Henchy Mccauley M.D.

## 2015-08-10 ENCOUNTER — Encounter: Payer: Self-pay | Admitting: Internal Medicine

## 2015-08-10 ENCOUNTER — Ambulatory Visit (INDEPENDENT_AMBULATORY_CARE_PROVIDER_SITE_OTHER): Payer: Managed Care, Other (non HMO) | Admitting: Internal Medicine

## 2015-08-10 VITALS — BP 112/76 | HR 85 | Temp 97.7°F | Ht 71.0 in | Wt 300.6 lb

## 2015-08-10 DIAGNOSIS — Z7901 Long term (current) use of anticoagulants: Secondary | ICD-10-CM

## 2015-08-10 DIAGNOSIS — Z832 Family history of diseases of the blood and blood-forming organs and certain disorders involving the immune mechanism: Secondary | ICD-10-CM | POA: Diagnosis not present

## 2015-08-10 DIAGNOSIS — I82401 Acute embolism and thrombosis of unspecified deep veins of right lower extremity: Secondary | ICD-10-CM

## 2015-08-10 DIAGNOSIS — Z8249 Family history of ischemic heart disease and other diseases of the circulatory system: Secondary | ICD-10-CM

## 2015-08-10 DIAGNOSIS — IMO0001 Reserved for inherently not codable concepts without codable children: Secondary | ICD-10-CM

## 2015-08-10 DIAGNOSIS — Z79899 Other long term (current) drug therapy: Secondary | ICD-10-CM

## 2015-08-10 DIAGNOSIS — E1165 Type 2 diabetes mellitus with hyperglycemia: Secondary | ICD-10-CM

## 2015-08-10 MED ORDER — APIXABAN 5 MG PO TABS: 5.0000 mg | ORAL_TABLET | Freq: Two times a day (BID) | ORAL | Status: DC

## 2015-08-10 NOTE — Patient Instructions (Addendum)
Stay on the  eliquis   Until further notice   This is your second  dvt   Consideration of longer  Orcontinual   Anticoagulation.   We can get an opinon from  Hematology especially  with your moms hx of clotting  Fu with Dr Darnell Level as planned .   Keep  Annual appt.

## 2015-08-10 NOTE — Progress Notes (Signed)
Pre visit review using our clinic review tool, if applicable. No additional management support is needed unless otherwise documented below in the visit note. 

## 2015-08-30 ENCOUNTER — Encounter: Payer: Self-pay | Admitting: Internal Medicine

## 2015-08-30 ENCOUNTER — Ambulatory Visit (INDEPENDENT_AMBULATORY_CARE_PROVIDER_SITE_OTHER): Payer: Managed Care, Other (non HMO) | Admitting: Internal Medicine

## 2015-08-30 VITALS — BP 142/92 | HR 96 | Ht 72.0 in | Wt 298.8 lb

## 2015-08-30 DIAGNOSIS — Z794 Long term (current) use of insulin: Secondary | ICD-10-CM

## 2015-08-30 DIAGNOSIS — E1165 Type 2 diabetes mellitus with hyperglycemia: Secondary | ICD-10-CM | POA: Diagnosis not present

## 2015-08-30 MED ORDER — METFORMIN HCL ER 500 MG PO TB24: ORAL_TABLET | ORAL | Status: DC

## 2015-08-30 NOTE — Progress Notes (Signed)
Subjective:     Patient ID: Troy [name], male   DOB: 1971/02/05, 45 y.o.   MRN: [id number]  HPI Troy [name] is a pleasant 45 y.o. man returning for management of DM2, dx 2012, insulin-dependent, without complications, uncontrolled. Last visit was 2 years ago (!).   His mom (also my pt, Troy [name]) passed away from PNA in May 13, 2015.  He had a DVT in R leg in 2015-05-13.  He lost 45 lbs in last 1.5 years - mostly vegan food.   Started to work nights 1 mo ago.  His latest hemoglobin A1c levels were:  Lab Results  Component Value Date   HGBA1C 10.0* 10/08/2013   HGBA1C 9.5* 06/15/2013   HGBA1C 11.4* 03/16/2013   He is on: - Metformin XR 2000 mg in am >> ran out in 03/2015 - VGo 40 >> off since 05/13/2015:  - 40 units Humalog over 24 hours  - 8 units with a regular meal, 10 with a large meal. Increase snack bolus to 4-6 units depending on the carb content. We have added Lantus 20 units before last visit >> in am (b/c work schedule) as sugars were high and he had a large supply of VGo 30 reservoirs. Now off Lantus.  He checks his sugars seldom: - am:140-190 >> 140-150 >> 240-265 >> 133-207 >> 130-160 (ave 140s) >> 120-152, usually 140-144 >> 200-300 - before lunch: 129-150 >> n/c  - after lunch: 174 >> n/c - predinner: 170-250 >> 120-130 >> 250 >> 136-201 >> 110-125 >> 130-160-188 >> 200-300 - bedtime: 170-250 >> 170-180 >> 200-300 >> 182 >> 140s >> (4-5 hours after dinner) 144-155 >> 200-300  He is eating vegetarian (mostly vegan) meals.   - No CKD: Lab Results  Component Value Date   BUN 11 04/22/2015   BUN 12 03/16/2013   CREATININE 0.67 04/22/2015   CREATININE 0.8 03/16/2013  Not on ACEI. Last ACR was normal: 2.0 on 03/16/2013. - Has HTG-emia: Lab Results  Component Value Date   CHOL 185 03/16/2013   HDL 30.70* 03/16/2013   LDLCALC 107* 07/29/2009   LDLDIRECT 70.5 03/16/2013   TRIG 592.0* 03/16/2013   CHOLHDL 6 03/16/2013  He is on Lipitor 10.  - Last eye exam was  in 05/2012. Needs to reschedule a new one. - No neuropathy.  - He has OSA and is compliant with CPAP.   I reviewed pt's medications, allergies, PMH, social hx, family hx, and changes were documented in the history of present illness. Otherwise, unchanged from my initial visit note.  Review of Systems Constitutional: + weight loss, no fatigue, no subjective hyperthermia/hypothermia;  Eyes: no blurry vision, no xerophthalmia ENT: no sore throat, no nodules palpated in throat, no dysphagia/odynophagia, no hoarseness Cardiovascular: no CP/SOB/palpitations/leg swelling Respiratory: no cough/SOB Gastrointestinal: no N/V/D/C Musculoskeletal: no muscle/joint aches Skin: no rashes  Objective:   Physical Exam BP 142/92 mmHg  Pulse 96  Ht 6' (1.829 m)  Wt 298 lb 12.8 oz (135.535 kg)  BMI 40.52 kg/m2  SpO2 96% Body mass index is 40.52 kg/(m^2). Wt Readings from Last 3 Encounters:  08/30/15 298 lb 12.8 oz (135.535 kg)  08/10/15 300 lb 9.6 oz (136.351 kg)  05/10/15 298 lb 11.2 oz (135.489 kg)   Constitutional: obese, in NAD  Eyes: PERRLA, EOMI, no exophthalmos ENT: moist mucous membranes, no thyromegaly, no cervical lymphadenopathy Cardiovascular: RRR, No MRG Respiratory: CTA B Gastrointestinal: abdomen soft, NT, ND, BS+ Musculoskeletal: no deformities, strength intact in all 4 Skin: moist, warm, +  stasis dermatitis bilat.   Assessment:     1. DM2, insulin-dependent, without complications, uncontrolled  Plan:     1. Patient with uncontrolled DM, returning after a long absence. HbA1c 11.5%, very high. Since April 26, 2015 when his mother died, he has been off the VGo and Metformin >> will restart. May need Invokana at next visit. - I advised him to: Patient Instructions  Please restart the VGo 40:  4-6 units depending on the carb content.  8 units with a regular meal  10 with a large meal  When you have get the Metformin, restart it at 2 tablets in am, then increase to 2000 mg in 2  days.  Please return in 1.5 months with your sugar log.   - strongly encouraged him to continue the vegetarian/vegan diet >> he started to eat mostly vegan foods >> lost 45 lbs in last ~1.5 years.  - I advised him to check his sugars throughout the day >> bring log at next visit - needs a new eye exam - refilled Metformin - return to clinic in 1.5 months with his sugar log

## 2015-08-30 NOTE — Patient Instructions (Signed)
Please restart the VGo 40:  4-6 units depending on the carb content.  8 units with a regular meal  10 with a large meal  When you have get the Metformin, restart it at 2 tablets in am, then increase to 2000 mg in 2 days.  Please return in 1.5 months with your sugar log.

## 2015-10-18 ENCOUNTER — Other Ambulatory Visit (INDEPENDENT_AMBULATORY_CARE_PROVIDER_SITE_OTHER): Payer: Managed Care, Other (non HMO)

## 2015-10-18 DIAGNOSIS — R7989 Other specified abnormal findings of blood chemistry: Secondary | ICD-10-CM | POA: Diagnosis not present

## 2015-10-18 DIAGNOSIS — Z0000 Encounter for general adult medical examination without abnormal findings: Secondary | ICD-10-CM

## 2015-10-19 ENCOUNTER — Other Ambulatory Visit: Payer: Self-pay

## 2015-10-19 LAB — CBC WITH DIFFERENTIAL/PLATELET
BASOS PCT: 0.5 % (ref 0.0–3.0)
Basophils Absolute: 0 10*3/uL (ref 0.0–0.1)
EOS PCT: 1.1 % (ref 0.0–5.0)
Eosinophils Absolute: 0.1 10*3/uL (ref 0.0–0.7)
HEMATOCRIT: 45.3 % (ref 39.0–52.0)
HEMOGLOBIN: 15.6 g/dL (ref 13.0–17.0)
Lymphocytes Relative: 41.6 % (ref 12.0–46.0)
Lymphs Abs: 4.1 10*3/uL — ABNORMAL HIGH (ref 0.7–4.0)
MCHC: 34.4 g/dL (ref 30.0–36.0)
MCV: 94.5 fl (ref 78.0–100.0)
MONOS PCT: 4.1 % (ref 3.0–12.0)
Monocytes Absolute: 0.4 10*3/uL (ref 0.1–1.0)
Neutro Abs: 5.1 10*3/uL (ref 1.4–7.7)
Neutrophils Relative %: 52.7 % (ref 43.0–77.0)
Platelets: 258 10*3/uL (ref 150.0–400.0)
RBC: 4.79 Mil/uL (ref 4.22–5.81)
RDW: 12.8 % (ref 11.5–15.5)
WBC: 9.8 10*3/uL (ref 4.0–10.5)

## 2015-10-19 LAB — BASIC METABOLIC PANEL
BUN: 9 mg/dL (ref 6–23)
CHLORIDE: 98 meq/L (ref 96–112)
CO2: 28 mEq/L (ref 19–32)
Calcium: 9.5 mg/dL (ref 8.4–10.5)
Creatinine, Ser: 0.74 mg/dL (ref 0.40–1.50)
GFR: 121.46 mL/min (ref >60.00)
GLUCOSE: 254 mg/dL — AB (ref 70–99)
POTASSIUM: 4.3 meq/L (ref 3.5–5.1)
SODIUM: 136 meq/L (ref 135–145)

## 2015-10-19 LAB — LIPID PANEL
CHOLESTEROL: 210 mg/dL — AB (ref 0–200)
HDL: 33.4 mg/dL — ABNORMAL LOW (ref >39.00)
Total CHOL/HDL Ratio: 6
Triglycerides: 403 mg/dL — ABNORMAL HIGH (ref 0.0–149.0)

## 2015-10-19 LAB — HEPATIC FUNCTION PANEL
ALBUMIN: 4.1 g/dL (ref 3.5–5.2)
ALT: 18 U/L (ref 0–53)
AST: 12 U/L (ref 0–37)
Alkaline Phosphatase: 50 U/L (ref 39–117)
Bilirubin, Direct: 0.1 mg/dL (ref 0.0–0.3)
TOTAL PROTEIN: 6.8 g/dL (ref 6.0–8.3)
Total Bilirubin: 0.9 mg/dL (ref 0.2–1.2)

## 2015-10-19 LAB — MICROALBUMIN / CREATININE URINE RATIO
CREATININE, U: 316.6 mg/dL
MICROALB/CREAT RATIO: 5.8 mg/g (ref 0.0–30.0)
Microalb, Ur: 18.4 mg/dL — ABNORMAL HIGH (ref 0.0–1.9)

## 2015-10-19 LAB — LDL CHOLESTEROL, DIRECT: LDL DIRECT: 116 mg/dL

## 2015-10-19 LAB — TSH: TSH: 2.16 u[IU]/mL (ref 0.35–4.50)

## 2015-10-19 LAB — HEMOGLOBIN A1C: Hgb A1c MFr Bld: 11.1 % — ABNORMAL HIGH (ref 4.6–6.5)

## 2015-10-26 ENCOUNTER — Ambulatory Visit (INDEPENDENT_AMBULATORY_CARE_PROVIDER_SITE_OTHER): Payer: Managed Care, Other (non HMO) | Admitting: Internal Medicine

## 2015-10-26 ENCOUNTER — Encounter: Payer: Self-pay | Admitting: Internal Medicine

## 2015-10-26 VITALS — BP 114/78 | Temp 98.1°F | Ht 70.5 in | Wt 297.6 lb

## 2015-10-26 DIAGNOSIS — Z7901 Long term (current) use of anticoagulants: Secondary | ICD-10-CM | POA: Diagnosis not present

## 2015-10-26 DIAGNOSIS — Z79899 Other long term (current) drug therapy: Secondary | ICD-10-CM

## 2015-10-26 DIAGNOSIS — Z23 Encounter for immunization: Secondary | ICD-10-CM | POA: Diagnosis not present

## 2015-10-26 DIAGNOSIS — E785 Hyperlipidemia, unspecified: Secondary | ICD-10-CM

## 2015-10-26 DIAGNOSIS — Z0000 Encounter for general adult medical examination without abnormal findings: Secondary | ICD-10-CM | POA: Diagnosis not present

## 2015-10-26 DIAGNOSIS — I82401 Acute embolism and thrombosis of unspecified deep veins of right lower extremity: Secondary | ICD-10-CM

## 2015-10-26 DIAGNOSIS — E119 Type 2 diabetes mellitus without complications: Secondary | ICD-10-CM | POA: Diagnosis not present

## 2015-10-26 MED ORDER — ATORVASTATIN CALCIUM 20 MG PO TABS: 20.0000 mg | ORAL_TABLET | Freq: Every day | ORAL | 1 refills | Status: DC

## 2015-10-26 NOTE — Patient Instructions (Addendum)
Intensify lifestyle interventions. And fu with endo about the diabetes.  Triglycerides should get better with diabetes control . Advise  Statin medication by guidelines for cholesterol and reduction of risk for cardio vascular events .  ldl below 70  And or statin med .   Can begin med if you agree  Check feet  As  You are dong because of some of the neuropathy sx .  Will get you the contact number for DR [facility]  Apologies referral  [place] through.    If not tried  flurastor or culturelle probiotic for diarrhea  Trial.  Plan aaa screening   Age 45  With abdominal US  Because of family hx    Health Maintenance, Male A healthy lifestyle and preventative care can promote health and wellness.  Maintain regular health, dental, and eye exams.  Eat a healthy diet. Foods like vegetables, fruits, whole grains, low-fat dairy products, and lean protein foods contain the nutrients you need and are low in calories. Decrease your intake of foods high in solid fats, added sugars, and salt. Get information about a proper diet from your health care provider, if necessary.  Regular physical exercise is one of the most important things you can do for your health. Most adults should get at least 150 minutes of moderate-intensity exercise (any activity that increases your heart rate and causes you to sweat) each week. In addition, most adults need muscle-strengthening exercises on 2 or more days a week.   Maintain a healthy weight. The body mass index (BMI) is a screening tool to identify possible weight problems. It provides an estimate of body fat based on height and weight. Your health care provider can find your BMI and can help you achieve or maintain a healthy weight. For males 20 years and older:  A BMI below 18.5 is considered underweight.  A BMI of 18.5 to 24.9 is normal.  A BMI of 25 to 29.9 is considered overweight.  A BMI of 30 and above is considered obese.  Maintain normal blood lipids and  cholesterol by exercising and minimizing your intake of saturated fat. Eat a balanced diet with plenty of fruits and vegetables. Blood tests for lipids and cholesterol should begin at age 65 and be repeated every 5 years. If your lipid or cholesterol levels are high, you are over age 79, or you are at high risk for heart disease, you may need your cholesterol levels checked more frequently.Ongoing high lipid and cholesterol levels should be treated with medicines if diet and exercise are not working.  If you smoke, find out from your health care provider how to quit. If you do not use tobacco, do not start.  Lung cancer screening is recommended for adults aged 23-80 years who are at high risk for developing lung cancer because of a history of smoking. A yearly low-dose CT scan of the lungs is recommended for people who have at least a 30-pack-year history of smoking and are current smokers or have quit within the past 15 years. A pack year of smoking is smoking an average of 1 pack of cigarettes a day for 1 year (for example, a 30-pack-year history of smoking could mean smoking 1 pack a day for 30 years or 2 packs a day for 15 years). Yearly screening should continue until the smoker has stopped smoking for at least 15 years. Yearly screening should be stopped for people who develop a health problem that would prevent them from having lung cancer  treatment.  If you choose to drink alcohol, do not have more than 2 drinks per day. One drink is considered to be 12 oz (360 mL) of beer, 5 oz (150 mL) of wine, or 1.5 oz (45 mL) of liquor.  Avoid the use of street drugs. Do not share needles with anyone. Ask for help if you need support or instructions about stopping the use of drugs.  High blood pressure causes heart disease and increases the risk of stroke. High blood pressure is more likely to develop in:  People who have blood pressure in the end of the normal range (100-139/85-89 mm Hg).  People who are  overweight or obese.  People who are African American.  If you are 19-62 years of age, have your blood pressure checked every 3-5 years. If you are 27 years of age or older, have your blood pressure checked every year. You should have your blood pressure measured twice--once when you are at a hospital or clinic, and once when you are not at a hospital or clinic. Record the average of the two measurements. To check your blood pressure when you are not at a hospital or clinic, you can use:  An automated blood pressure machine at a pharmacy.  A home blood pressure monitor.  If you are 29-9 years old, ask your health care provider if you should take aspirin to prevent heart disease.  Diabetes screening involves taking a blood sample to check your fasting blood sugar level. This should be done once every 3 years after age 47 if you are at a normal weight and without risk factors for diabetes. Testing should be considered at a younger age or be carried out more frequently if you are overweight and have at least 1 risk factor for diabetes.  Colorectal cancer can be detected and often prevented. Most routine colorectal cancer screening begins at the age of 75 and continues through age 71. However, your health care provider may recommend screening at an earlier age if you have risk factors for colon cancer. On a yearly basis, your health care provider may provide home test kits to check for hidden blood in the stool. A small camera at the end of a tube may be used to directly examine the colon (sigmoidoscopy or colonoscopy) to detect the earliest forms of colorectal cancer. Talk to your health care provider about this at age 29 when routine screening begins. A direct exam of the colon should be repeated every 5-10 years through age 60, unless early forms of precancerous polyps or small growths are found.  People who are at an increased risk for hepatitis B should be screened for this virus. You are  considered at high risk for hepatitis B if:  You were born in a country where hepatitis B occurs often. Talk with your health care provider about which countries are considered high risk.  Your parents were born in a high-risk country and you have not received a shot to protect against hepatitis B (hepatitis B vaccine).  You have HIV or AIDS.  You use needles to inject street drugs.  You live with, or have sex with, someone who has hepatitis B.  You are a man who has sex with other men (MSM).  You get hemodialysis treatment.  You take certain medicines for conditions like cancer, organ transplantation, and autoimmune conditions.  Hepatitis C blood testing is recommended for all people born from [birth year] through 1965 and any individual with known risk factors for  hepatitis C.  Healthy men should no longer receive prostate-specific antigen (PSA) blood tests as part of routine cancer screening. Talk to your health care provider about prostate cancer screening.  Testicular cancer screening is not recommended for adolescents or adult males who have no symptoms. Screening includes self-exam, a health care provider exam, and other screening tests. Consult with your health care provider about any symptoms you have or any concerns you have about testicular cancer.  Practice safe sex. Use condoms and avoid high-risk sexual practices to reduce the spread of sexually transmitted infections (STIs).  You should be screened for STIs, including gonorrhea and chlamydia if:  You are sexually active and are younger than 24 years.  You are older than 24 years, and your health care provider tells you that you are at risk for this type of infection.  Your sexual activity has changed since you were last screened, and you are at an increased risk for chlamydia or gonorrhea. Ask your health care provider if you are at risk.  If you are at risk of being infected with HIV, it is recommended that you take a  prescription medicine daily to prevent HIV infection. This is called pre-exposure prophylaxis (PrEP). You are considered at risk if:  You are a man who has sex with other men (MSM).  You are a heterosexual man who is sexually active with multiple partners.  You take drugs by injection.  You are sexually active with a partner who has HIV.  Talk with your health care provider about whether you are at high risk of being infected with HIV. If you choose to begin PrEP, you should first be tested for HIV. You should then be tested every 3 months for as long as you are taking PrEP.  Use sunscreen. Apply sunscreen liberally and repeatedly throughout the day. You should seek shade when your shadow is shorter than you. Protect yourself by wearing long sleeves, pants, a wide-brimmed hat, and sunglasses year round whenever you are outdoors.  Tell your health care provider of new moles or changes in moles, especially if there is a change in shape or color. Also, tell your health care provider if a mole is larger than the size of a pencil eraser.  A one-time screening for abdominal aortic aneurysm (AAA) and surgical repair of large AAAs by ultrasound is recommended for men aged 2-75 years who are current or former smokers.  Stay current with your vaccines (immunizations).   This information is not intended to replace advice given to you by your health care provider. Make sure you discuss any questions you have with your health care provider.   Document Released: 08/25/2007 Document Revised: 03/19/2014 Document Reviewed: 07/24/2010 Elsevier Interactive Patient Education [employer].

## 2015-10-26 NOTE — Progress Notes (Signed)
Pre visit review using our clinic review tool, if applicable. No additional management support is needed unless otherwise documented below in the visit note.  Chief Complaint  Patient presents with  . Annual Exam    HPI: Patient  Troy [name]  45 y.o. comes in today for Preventive Health Care visit  He has hx DM, DVT recurrent , LIPDS, obesity OSA .Marland KitchenMarland Kitchen  Dm back on pupm sugars coming down    To 100s to see dr Darnell Level soon   Eye check ok blurry on accomodation at times   Left dvt le no change on anticoagulation   Referral to dr Beryle Beams   Didn't get througt .  Right foot numb at toe and middle mt but from  Prev injury  Left some between 1 2 toes  No ulcers .  Ongoing loose stools diarrhea   Pre dated metformin for 10 years .   fam hx AAA father 58s smoker and mom ex smoke and grand parent  Father     Health Maintenance  Topic Date Due  . OPHTHALMOLOGY EXAM  01/26/2012  . HIV Screening  10/25/2016 (Originally 08/07/1985)  . HEMOGLOBIN A1C  04/19/2016  . URINE MICROALBUMIN  10/17/2016  . FOOT EXAM  10/25/2016  . TETANUS/TDAP  02/12/2017  . PNEUMOCOCCAL POLYSACCHARIDE VACCINE (2) 10/25/2020  . INFLUENZA VACCINE  Completed   Health Maintenance Review LIFESTYLE:  Exercise:   Not a lot  Tobacco/ETS:n Alcohol:  n Sugar beverages: Sleep:  Yes   Drug use: no    ROS:  GEN/ HEENT: No fever, significant weight changes sweats headaches vision problems hearing changes, CV/ PULM; No chest pain shortness of breath cough, syncope,edema  change in exercise tolerance. GI /GU: No adominal pain, vomiting, change in bowel habits. No blood in the stool. No significant GU symptoms. SKIN/HEME: ,no acute skin rashes suspicious lesions or bleeding. No lymphadenopathy, nodules, masses.  NEURO/ PSYCH:  No neurologic signs such as weakness numbness. No depression anxiety. IMM/ Allergy: No unusual infections.  Allergy .   REST of 12 system review negative except as per HPI   Past Medical  History:  Diagnosis Date  . Arm weakness    right   . Asthma   . Diabetes insipidus ([place])   . Diabetes mellitus without complication ([place])   . DVT (deep venous thrombosis) ([place])   . Obesity   . OSA (obstructive sleep apnea)     Past Surgical History:  Procedure Laterality Date  . BACK SURGERY    . CERVICAL DISC SURGERY     C6/C7 2009  . cubital tunnel left arm     2003  . ELBOW SURGERY    . FIBULA FRACTURE SURGERY     plate & pin removed due to infection 1997  . ULNAR NERVE REPAIR      Family History  Problem Relation Age of Onset  . Heart disease Mother   . Other Mother     clotting disorders  . Diabetes Mother   . Hypertension    . Allergies    . Deep vein thrombosis    . Sleep apnea    . Obesity    . Other Father     sudden death/cervical and lumbar disk disease  . Aneurysm Father     In his 14s smoked  . Hyperlipidemia Father     Social History   Social History  . Marital status: Married    Spouse name: N/A  . Number of children: N/A  .  Years of education: N/A   Occupational History  . 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 Smoking status: Never Smoker  . Smokeless tobacco: Never Used  . Alcohol use Yes     Comment: 1 drink mo.  . Drug use: No  . Sexual activity: Yes   Other Topics Concern  . None   Social History Narrative   Occupation:  days [employer] 11- 8 am  Now changed job and shift to [employer] through Thursday    Working  Ft 40 hours mostly   Married _0 Wife s/p bariatric surgery pt   Regular exercise- yes   Pt does have children   Father died suddenly 2008/05/18   Mom passes 2017  Acute rep disease after acute renal failure   Daily caffeine use one a day   2 dogs and cat.                 Outpatient Medications Prior to Visit  Medication Sig Dispense Refill  . albuterol (PROVENTIL HFA;VENTOLIN HFA) 108 (90 BASE) MCG/ACT inhaler Inhale 2 puffs into the lungs every 6 (six) hours as  needed.      [name] apixaban (ELIQUIS) 5 MG TABS tablet Take 1 tablet (5 mg total) by mouth 2 (two) times daily. 180 tablet 2  . glucose blood test strip Use as instructed 100 each 12  . glucose blood test strip Use as instructed 100 each 12  . Insulin Disposable Pump (V-GO 40) KIT USE ONCE DAILY 90 kit 0  . insulin lispro (HUMALOG) 100 UNIT/ML injection Use up to 80 units daily. To use with Vgo 40 system (Patient taking differently: Use up to 40 units daily. To use with Vgo 40 system) 90 mL 3  . metFORMIN (GLUCOPHAGE-XR) 500 MG 24 hr tablet TAKE 4 TABLETS (2000 MG) DAILY WITH BREAKFAST 360 tablet 1   No facility-administered medications prior to visit.      EXAM:  BP 114/78 (BP Location: Right Arm, Patient Position: Sitting, Cuff Size: Large)   Temp 98.1 F (36.7 C) (Oral)   Ht 5' 10.5" (1.791 m)   Wt 297 lb 9.6 oz (135 kg)   BMI 42.10 kg/m   Body mass index is 42.1 kg/m.  Physical Exam: Vital signs reviewed KJZ:PHXT is a well-developed well-nourished alert cooperative    who appearsr stated age in no acute distress.  HEENT: normocephalic atraumatic , Eyes: PERRL EOM's full, conjunctiva clear, Nares: paten,t no deformity discharge or tenderness., Ears: no deformity EAC's clear TMs with normal landmarks. Mouth: clear OP, no lesions, edema.  Moist mucous membranes. Dentition in adequate repair. NECK: supple without masses, thyromegaly or bruits. CHEST/PULM:  Clear to auscultation and percussion breath sounds equal no wheeze , rales or rhonchi. No chest wall deformities or tenderness. CV: PMI is nondisplaced, S1 S2 no gallops, murmurs, rubs. Peripheral pulses are full without delay.No JVD .  ABDOMEN: Bowel sounds normal nontender  No guard or rebound, no hepato splenomegal no CVA tenderness. Pump in place  Extremtities:  No clubbing cyanosis chronic changes right le   No acute  or edema, no acute joint swelling or redness  Healed scar right elbow ulnar neuropathy  NEURO:  Oriented x3,  cranial nerves 3-12 appear to be intact, no obvious focal weakness,gait within normal limits no abnormal reflexes  Rue neuro changes from old trauma SKIN: No acute rashes normal turgor, color, no bruising or petechiae. PSYCH:  Oriented, good eye contact, no obvious depression anxiety, cognition and judgment appear normal. LN: no cervical axillary inguinal adenopathy  Lab Results  Component Value Date   WBC 9.8 10/18/2015   HGB 15.6 10/18/2015   HCT 45.3 10/18/2015   PLT 258.0 10/18/2015   GLUCOSE 254 (H) 10/18/2015   CHOL 210 (H) 10/18/2015   TRIG (H) 10/18/2015    403.0 Triglyceride is over 400; calculations on Lipids are invalid.   HDL 33.40 (L) 10/18/2015   LDLDIRECT 116.0 10/18/2015   LDLCALC 107 (H) 07/29/2009   ALT 18 10/18/2015   AST 12 10/18/2015   NA 136 10/18/2015   K 4.3 10/18/2015   CL 98 10/18/2015   CREATININE 0.74 10/18/2015   BUN 9 10/18/2015   CO2 28 10/18/2015   TSH 2.16 10/18/2015   HGBA1C 11.1 (H) 10/18/2015   MICROALBUR 18.4 (H) 10/18/2015    ASSESSMENT AND PLAN:  Discussed the following assessment and plan:  Visit for preventive health examination  Type 2 diabetes mellitus not at goal [facility])  DVT (deep venous thrombosis), right  Morbid obesity, unspecified obesity type ([name])  Chronic anticoagulation  Medication management  Hyperlipidemia  Need for prophylactic vaccination and inoculation against influenza - Plan: Flu Vaccine QUAD 36+ mos PF IM (Fluarix & Fluzone Quad PF)  Need for 23-polyvalent pneumococcal polysaccharide vaccine - Plan: Pneumococcal polysaccharide vaccine 23-valent greater than or equal to 2yo subcutaneous/IM Advise adding statin medication  For dec cv risk  Pneumovax  Flu vaccine when available  Patient Care Team: Madelin Headings, MD as PCP - General Barbaraann Share, MD (Pulmonary Disease) Carlus Pavlov, MD as Consulting Physician (Internal Medicine) Patient Instructions  Intensify lifestyle interventions. And fu  with endo about the diabetes.  Triglycerides should get better with diabetes control . Advise  Statin medication by guidelines for cholesterol and reduction of risk for cardio vascular events .  ldl below 70  And or statin med .   Can begin med if you agree  Check feet  As  You are dong because of some of the neuropathy sx .  Will get you the contact number for DR [facility]  Apologies referral  [place] through.    If not tried  flurastor or culturelle probiotic for diarrhea  Trial.  Plan aaa screening   Age 38  With abdominal US  Because of family hx    Health Maintenance, Male A healthy lifestyle and preventative care can promote health and wellness.  Maintain regular health, dental, and eye exams.  Eat a healthy diet. Foods like vegetables, fruits, whole grains, low-fat dairy products, and lean protein foods contain the nutrients you need and are low in calories. Decrease your intake of foods high in solid fats, added sugars, and salt. Get information about a proper diet from your health care provider, if necessary.  Regular physical exercise is one of the most important things you can do for your health. Most adults should get at least 150 minutes of moderate-intensity exercise (any activity that increases your heart rate and causes you to sweat) each week. In addition, most adults need muscle-strengthening exercises on 2 or more days a week.   Maintain a healthy weight. The body mass index (BMI) is a screening tool to identify possible weight problems. It provides an estimate of body fat based on height and weight. Your health care provider can find your BMI and can help you achieve or maintain a healthy weight. For males 20 years and older:  A  BMI below 18.5 is considered underweight.  A BMI of 18.5 to 24.9 is normal.  A BMI of 25 to 29.9 is considered overweight.  A BMI of 30 and above is considered obese.  Maintain normal blood lipids and cholesterol by exercising and minimizing  your intake of saturated fat. Eat a balanced diet with plenty of fruits and vegetables. Blood tests for lipids and cholesterol should begin at age 65 and be repeated every 5 years. If your lipid or cholesterol levels are high, you are over age 53, or you are at high risk for heart disease, you may need your cholesterol levels checked more frequently.Ongoing high lipid and cholesterol levels should be treated with medicines if diet and exercise are not working.  If you smoke, find out from your health care provider how to quit. If you do not use tobacco, do not start.  Lung cancer screening is recommended for adults aged 51-80 years who are at high risk for developing lung cancer because of a history of smoking. A yearly low-dose CT scan of the lungs is recommended for people who have at least a 30-pack-year history of smoking and are current smokers or have quit within the past 15 years. A pack year of smoking is smoking an average of 1 pack of cigarettes a day for 1 year (for example, a 30-pack-year history of smoking could mean smoking 1 pack a day for 30 years or 2 packs a day for 15 years). Yearly screening should continue until the smoker has stopped smoking for at least 15 years. Yearly screening should be stopped for people who develop a health problem that would prevent them from having lung cancer treatment.  If you choose to drink alcohol, do not have more than 2 drinks per day. One drink is considered to be 12 oz (360 mL) of beer, 5 oz (150 mL) of wine, or 1.5 oz (45 mL) of liquor.  Avoid the use of street drugs. Do not share needles with anyone. Ask for help if you need support or instructions about stopping the use of drugs.  High blood pressure causes heart disease and increases the risk of stroke. High blood pressure is more likely to develop in:  People who have blood pressure in the end of the normal range (100-139/85-89 mm Hg).  People who are overweight or obese.  People who are  African American.  If you are 24-69 years of age, have your blood pressure checked every 3-5 years. If you are 47 years of age or older, have your blood pressure checked every year. You should have your blood pressure measured twice--once when you are at a hospital or clinic, and once when you are not at a hospital or clinic. Record the average of the two measurements. To check your blood pressure when you are not at a hospital or clinic, you can use:  An automated blood pressure machine at a pharmacy.  A home blood pressure monitor.  If you are 29-17 years old, ask your health care provider if you should take aspirin to prevent heart disease.  Diabetes screening involves taking a blood sample to check your fasting blood sugar level. This should be done once every 3 years after age 29 if you are at a normal weight and without risk factors for diabetes. Testing should be considered at a younger age or be carried out more frequently if you are overweight and have at least 1 risk factor for diabetes.  Colorectal cancer can be  detected and often prevented. Most routine colorectal cancer screening begins at the age of 41 and continues through age 78. However, your health care provider may recommend screening at an earlier age if you have risk factors for colon cancer. On a yearly basis, your health care provider may provide home test kits to check for hidden blood in the stool. A small camera at the end of a tube may be used to directly examine the colon (sigmoidoscopy or colonoscopy) to detect the earliest forms of colorectal cancer. Talk to your health care provider about this at age 34 when routine screening begins. A direct exam of the colon should be repeated every 5-10 years through age 54, unless early forms of precancerous polyps or small growths are found.  People who are at an increased risk for hepatitis B should be screened for this virus. You are considered at high risk for hepatitis B  if:  You were born in a country where hepatitis B occurs often. Talk with your health care provider about which countries are considered high risk.  Your parents were born in a high-risk country and you have not received a shot to protect against hepatitis B (hepatitis B vaccine).  You have HIV or AIDS.  You use needles to inject street drugs.  You live with, or have sex with, someone who has hepatitis B.  You are a man who has sex with other men (MSM).  You get hemodialysis treatment.  You take certain medicines for conditions like cancer, organ transplantation, and autoimmune conditions.  Hepatitis C blood testing is recommended for all people born from [birth year] through 1965 and any individual with known risk factors for hepatitis C.  Healthy men should no longer receive prostate-specific antigen (PSA) blood tests as part of routine cancer screening. Talk to your health care provider about prostate cancer screening.  Testicular cancer screening is not recommended for adolescents or adult males who have no symptoms. Screening includes self-exam, a health care provider exam, and other screening tests. Consult with your health care provider about any symptoms you have or any concerns you have about testicular cancer.  Practice safe sex. Use condoms and avoid high-risk sexual practices to reduce the spread of sexually transmitted infections (STIs).  You should be screened for STIs, including gonorrhea and chlamydia if:  You are sexually active and are younger than 24 years.  You are older than 24 years, and your health care provider tells you that you are at risk for this type of infection.  Your sexual activity has changed since you were last screened, and you are at an increased risk for chlamydia or gonorrhea. Ask your health care provider if you are at risk.  If you are at risk of being infected with HIV, it is recommended that you take a prescription medicine daily to prevent HIV  infection. This is called pre-exposure prophylaxis (PrEP). You are considered at risk if:  You are a man who has sex with other men (MSM).  You are a heterosexual man who is sexually active with multiple partners.  You take drugs by injection.  You are sexually active with a partner who has HIV.  Talk with your health care provider about whether you are at high risk of being infected with HIV. If you choose to begin PrEP, you should first be tested for HIV. You should then be tested every 3 months for as long as you are taking PrEP.  Use sunscreen. Apply sunscreen liberally and repeatedly  throughout the day. You should seek shade when your shadow is shorter than you. Protect yourself by wearing long sleeves, pants, a wide-brimmed hat, and sunglasses year round whenever you are outdoors.  Tell your health care provider of new moles or changes in moles, especially if there is a change in shape or color. Also, tell your health care provider if a mole is larger than the size of a pencil eraser.  A one-time screening for abdominal aortic aneurysm (AAA) and surgical repair of large AAAs by ultrasound is recommended for men aged 44-75 years who are current or former smokers.  Stay current with your vaccines (immunizations).   This information is not intended to replace advice given to you by your health care provider. Make sure you discuss any questions you have with your health care provider.   Document Released: 08/25/2007 Document Revised: 03/19/2014 Document Reviewed: 07/24/2010 Elsevier Interactive Patient Education 2016 [place] K. [name] M.D.

## 2015-10-31 ENCOUNTER — Encounter: Payer: Self-pay | Admitting: Internal Medicine

## 2015-10-31 ENCOUNTER — Ambulatory Visit (INDEPENDENT_AMBULATORY_CARE_PROVIDER_SITE_OTHER): Payer: Managed Care, Other (non HMO) | Admitting: Internal Medicine

## 2015-10-31 VITALS — BP 138/88 | HR 88 | Ht 72.0 in | Wt 306.0 lb

## 2015-10-31 DIAGNOSIS — E1165 Type 2 diabetes mellitus with hyperglycemia: Secondary | ICD-10-CM | POA: Diagnosis not present

## 2015-10-31 DIAGNOSIS — Z794 Long term (current) use of insulin: Secondary | ICD-10-CM | POA: Diagnosis not present

## 2015-10-31 MED ORDER — CANAGLIFLOZIN 100 MG PO TABS: 100.0000 mg | ORAL_TABLET | Freq: Every day | ORAL | 5 refills | Status: DC

## 2015-10-31 NOTE — Progress Notes (Signed)
Subjective:     Patient ID: Troy [name], male   DOB: 08/27/1970, 45 y.o.   MRN: [id number]  HPI Mr. Moskwa is a pleasant 45 y.o. man returning for management of DM2, dx 2012, insulin-dependent, without complications, uncontrolled. Last visit was 2 mo ago.  Started to work nights in 07/2015.  At last visit, his sugars are really high, and we restarted his metformin and his VGo. A subsequent HbA1c was very high this month.  His latest hemoglobin A1c levels were:  Lab Results  Component Value Date   HGBA1C 11.1 (H) 10/18/2015   HGBA1C 10.0 (H) 10/08/2013   HGBA1C 9.5 (H) 06/15/2013   He is on: - Metformin XR 2000 mg in am - VGo 40:  4-6 units depending on the carb content.  8 units with a regular meal  10 with a large meal We have added Lantus 20 units before last visit >> in am (b/c work schedule) as sugars were high and he had a large supply of VGo 30 reservoirs. Now off Lantus.  He checks his sugars seldom: - b'fast: 133-207 >> 130-160 (ave 140s) >> 120-152, usually 140-144 >> 200-300 >> (2-4 pm) 153-271 - before lunch: 129-150 >> n/c >> 206 (9 pm) - after lunch: 174 >> n/c - predinner: 170-250 >> 120-130 >> 250 >> 136-201 >> 110-125 >> 130-160-188 >> 200-300 >> (1:30 am) 160-346 - bedtime: 170-250 >> 170-180 >> 200-300 >> 182 >> 140s >> (4-5 hours after dinner) 144-155 >> 200-300 >> 208-437  He is eating vegetarian (mostly vegan) meals.   - No CKD: Lab Results  Component Value Date   BUN 9 10/18/2015   BUN 11 04/22/2015   CREATININE 0.74 10/18/2015   CREATININE 0.67 04/22/2015  Not on ACEI. Last ACR was normal: 2.0 on 03/16/2013. - Has HTG-emia: Lab Results  Component Value Date   CHOL 210 (H) 10/18/2015   HDL 33.40 (L) 10/18/2015   LDLCALC 107 (H) 07/29/2009   LDLDIRECT 116.0 10/18/2015   TRIG (H) 10/18/2015    403.0 Triglyceride is over 400; calculations on Lipids are invalid.   CHOLHDL 6 10/18/2015  He is on Lipitor 10.  - Last eye exam was in  05/2012. Needs to reschedule a new one. - No neuropathy.  - He has OSA and is compliant with CPAP.   He had a DVT in R leg in 04/2015. He lost 45 lbs in last 1.5 years - mostly vegan food.   I reviewed pt's medications, allergies, PMH, social hx, family hx, and changes were documented in the history of present illness. Otherwise, unchanged from my initial visit note.  Review of Systems Constitutional: + weight gain, no fatigue, no subjective hyperthermia/hypothermia;  Eyes: no blurry vision, no xerophthalmia ENT: no sore throat, no nodules palpated in throat, no dysphagia/odynophagia, no hoarseness Cardiovascular: no CP/SOB/palpitations/leg swelling Respiratory: no cough/SOB Gastrointestinal: no N/V/D/C Musculoskeletal: no muscle/joint aches Skin: no rashes  Objective:   Physical Exam BP 138/88 (BP Location: Left Arm, Patient Position: Sitting)   Pulse 88   Ht 6' (1.829 m)   Wt (!) 306 lb (138.8 kg)   SpO2 96%   BMI 41.50 kg/m  Body mass index is 41.5 kg/m. Wt Readings from Last 3 Encounters:  10/31/15 (!) 306 lb (138.8 kg)  10/26/15 297 lb 9.6 oz (135 kg)  08/30/15 298 lb 12.8 oz (135.5 kg)   Constitutional: obese, in NAD  Eyes: PERRLA, EOMI, no exophthalmos ENT: moist mucous membranes, no thyromegaly, no cervical lymphadenopathy Cardiovascular:  RRR, No MRG Respiratory: CTA B Gastrointestinal: abdomen soft, NT, ND, BS+ Musculoskeletal: no deformities, strength intact in all 4 Skin: moist, warm, + stasis dermatitis bilat.   Assessment:     1. DM2, insulin-dependent, without complications, uncontrolled  Plan:     1. Patient with uncontrolled DM, with last HbA1c 11.1%, very high. Since 2015-05-07 when his mother died, he has been off the VGo and Metformin >> restarted at last visit. Sugars are better as he also started back the vegan diet, but they are still high >> will add Invokana. - we discussed about SEs of Invokana, which are: dizziness (advised to be careful when  stands from sitting position), decreased BP - usually not < normal (BP today is not low), and fungal UTIs (advised to let me know if develops one).  - given discount card for Invokana - I advised him to: Patient Instructions  Please continue: - Metformin XR 2000 mg in am - VGo 40:  4-6 units depending on the carb content.  8 units with a regular meal  10 with a large meal  Please add: - Invokana 100 mg daily before b'fast  Please return in 1.5 months with your sugar log.   - strongly encouraged him to continue the vegetarian/vegan diet  - continue to check his sugars throughout the day >> bring log again at next visit - needs a new eye exam - return to clinic in 1.5 months with his sugar log - needs a BMP then  Philemon Kingdom, MD PhD [facility] End[facility]

## 2015-10-31 NOTE — Patient Instructions (Addendum)
Please continue: - Metformin XR 2000 mg in am - VGo 40:  4-6 units depending on the carb content.  8 units with a regular meal  10 with a large meal  Please add: - Invokana 100 mg daily before b'fast  Please return in 1.5 months with your sugar log.

## 2015-12-22 ENCOUNTER — Ambulatory Visit (INDEPENDENT_AMBULATORY_CARE_PROVIDER_SITE_OTHER): Payer: Managed Care, Other (non HMO) | Admitting: Internal Medicine

## 2015-12-22 ENCOUNTER — Encounter: Payer: Self-pay | Admitting: Internal Medicine

## 2015-12-22 VITALS — BP 124/64 | HR 89 | Ht 72.0 in | Wt 306.0 lb

## 2015-12-22 DIAGNOSIS — Z794 Long term (current) use of insulin: Secondary | ICD-10-CM

## 2015-12-22 DIAGNOSIS — E1165 Type 2 diabetes mellitus with hyperglycemia: Secondary | ICD-10-CM

## 2015-12-22 LAB — BASIC METABOLIC PANEL WITH GFR
BUN: 9 mg/dL (ref 7–25)
CALCIUM: 9.4 mg/dL (ref 8.6–10.3)
CO2: 25 mmol/L (ref 20–31)
CREATININE: 0.69 mg/dL (ref 0.60–1.35)
Chloride: 101 mmol/L (ref 98–110)
GFR, Est African American: >89 mL/min (ref >=60)
GFR, Est Non African American: >89 mL/min (ref >=60)
Glucose, Bld: 243 mg/dL — ABNORMAL HIGH (ref 65–99)
Potassium: 4.4 mmol/L (ref 3.5–5.3)
SODIUM: 137 mmol/L (ref 135–146)

## 2015-12-22 NOTE — Progress Notes (Signed)
Subjective:     Patient ID: Troy [name], male   DOB: 02/10/1971, 45 y.o.   MRN: [id number]  HPI Troy [name] is a pleasant 45 y.o. man returning for management of DM2, dx 2012, insulin-dependent, without complications, uncontrolled. Last visit was 2 mo ago.  Started to work nights in 07/2015.  At last visit, his sugars are really high, and we restarted his metformin and his VGo. A subsequent HbA1c was very high this month.  His latest hemoglobin A1c levels were:  Lab Results  Component Value Date   HGBA1C 11.1 (H) 10/18/2015   HGBA1C 10.0 (H) 10/08/2013   HGBA1C 9.5 (H) 06/15/2013   He is on: - Metformin XR 2000 mg  At 3-4 pm - Invokana 100 mg before b'fast - started 10/2015 - VGo 40:  3-4 units with a smaller meal  5-6 units with a regular meal  8-10 units with a large meal We have added Lantus 20 units before last visit >> in am (b/c work schedule) as sugars were high and he had a large supply of VGo 30 reservoirs. Now off Lantus.  He checks his sugars seldom: - b'fast: 133-207 >> 130-160 (ave 140s) >> 120-152, usually 140-144 >> 200-300 >> (2-4 pm) 153-271 >> 150s - before lunch: 129-150 >> n/c >> 206 (9 pm) >> 150s - after lunch: 174 >> n/c - predinner: 120-130 >> 250 >> 136-201 >> 110-125 >> 130-160-188 >> 200-300 >> (1:30 am before dinner) 160-346  >> n/c - bedtime: 170-180 >> 200-300 >> 182 >> 140s >> (4-5 hours after dinner) 144-155 >> 200-300 >> 208-437 >> 150s  He is eating vegetarian (mostly vegan) meals.   - No CKD: Lab Results  Component Value Date   BUN 9 10/18/2015   BUN 11 04/22/2015   CREATININE 0.74 10/18/2015   CREATININE 0.67 04/22/2015  Not on ACEI. Last ACR was normal: 2.0 on 03/16/2013. - Has HTG-emia: Lab Results  Component Value Date   CHOL 210 (H) 10/18/2015   HDL 33.40 (L) 10/18/2015   LDLCALC 107 (H) 07/29/2009   LDLDIRECT 116.0 10/18/2015   TRIG (H) 10/18/2015    403.0 Triglyceride is over 400; calculations on Lipids are invalid.    CHOLHDL 6 10/18/2015  He is on Lipitor 10.  - Last eye exam was in 05/2012. Still did not reschedule a new one. - No neuropathy.  - He has OSA and is compliant with CPAP.   He had a DVT in R leg in 04/2015. He lost 45 lbs before 2017 - mostly vegan food.   I reviewed pt's medications, allergies, PMH, social hx, family hx, and changes were documented in the history of present illness. Otherwise, unchanged from my initial visit note.  Review of Systems Constitutional: no weight gain, no fatigue, no subjective hyperthermia/hypothermia;  Eyes: no blurry vision, no xerophthalmia ENT: no sore throat, no nodules palpated in throat, no dysphagia/odynophagia, no hoarseness Cardiovascular: no CP/SOB/palpitations/leg swelling Respiratory: no cough/SOB Gastrointestinal: no N/V/+ D/no C Musculoskeletal: no muscle/joint aches Skin: no rashes  Objective:   Physical Exam BP 124/64   Pulse 89   Ht 6' (1.829 m)   Wt (!) 306 lb (138.8 kg)   SpO2 97%   BMI 41.50 kg/m  Body mass index is 41.5 kg/m. Wt Readings from Last 3 Encounters:  12/22/15 (!) 306 lb (138.8 kg)  10/31/15 (!) 306 lb (138.8 kg)  10/26/15 297 lb 9.6 oz (135 kg)   Constitutional: obese, in NAD  Eyes: PERRLA, EOMI, no  exophthalmos ENT: moist mucous membranes, no thyromegaly, no cervical lymphadenopathy Cardiovascular: RRR, No MRG Respiratory: CTA B Gastrointestinal: abdomen soft, NT, ND, BS+ Musculoskeletal: no deformities, strength intact in all 4 Skin: moist, warm, + stasis dermatitis bilat.   Assessment:     1. DM2, insulin-dependent, without complications, uncontrolled  Plan:     1. Patient with uncontrolled DM, with last HbA1c 11.1%, very high. Since Apr 21, 2015 when his mother died, he has been off the VGo and Metformin >> restarted this summer. Sugars were better at last visit as he also started back the vegan diet, but they were still high >> we added Invokana. Sugars are MUCH better, very stable now, in the 150s.  I advised him to try to move Metformin dose closer to his bedtime to improve the sugars in am.  - I advised him to: Patient Instructions  Try to move Metformin all at dinnertime (in am). - Metformin XR 2000 mg   Please continue: - Invokana 100 mg before b'fast - started 10/2015 - VGo 40:  3-4 units with a smaller meal  5-6 units with a regular meal  8-10 units with a large meal  Please stop at the lab.  Please return in 2 months with your sugar log.    - continue to check his sugars throughout the day >> bring log again at next visit - check BMP today - needs a new eye exam >> again advised him to schedule - UTD with flu vaccine - return to clinic in 1.5 months with his sugar log  Needs refills for Invokana 90 days >> [facility] after labs are back  Office Visit on 12/22/2015  Component Date Value Ref Range Status  . Sodium 12/22/2015 137  135 - 146 mmol/L Final  . Potassium 12/22/2015 4.4  3.5 - 5.3 mmol/L Final  . Chloride 12/22/2015 101  98 - 110 mmol/L Final  . CO2 12/22/2015 25  20 - 31 mmol/L Final  . Glucose, Bld 12/22/2015 243* 65 - 99 mg/dL Final  . BUN 12/22/2015 9  7 - 25 mg/dL Final  . Creat 12/22/2015 0.69  0.60 - 1.35 mg/dL Final  . Calcium 12/22/2015 9.4  8.6 - 10.3 mg/dL Final  . GFR, Est African American 12/22/2015 >89  >=60 mL/min Final  . GFR, Est Non African American 12/22/2015 >89  >=60 mL/min Final    Philemon Kingdom, MD PhD [facility] Endocrinology

## 2015-12-22 NOTE — Patient Instructions (Addendum)
Try to move Metformin all at dinnertime (in am). - Metformin XR 2000 mg   Please continue: - Invokana 100 mg before b'fast - started 10/2015 - VGo 40:  3-4 units with a smaller meal  5-6 units with a regular meal  8-10 units with a large meal  Please stop at the lab.  Please return in 2 months with your sugar log.

## 2015-12-23 MED ORDER — CANAGLIFLOZIN 100 MG PO TABS: 100.0000 mg | ORAL_TABLET | Freq: Every day | ORAL | 3 refills | Status: DC

## 2016-01-27 ENCOUNTER — Other Ambulatory Visit: Payer: Managed Care, Other (non HMO)

## 2016-02-21 ENCOUNTER — Ambulatory Visit: Payer: Self-pay | Admitting: Internal Medicine

## 2016-04-07 ENCOUNTER — Other Ambulatory Visit: Payer: Self-pay | Admitting: Internal Medicine

## 2016-04-24 NOTE — Progress Notes (Deleted)
No chief complaint on file.   HPI: Troy [name] 46 y.o. come in for Chronic disease management    Dm  Dr Darnell Level Bp Recurrent DVT: le  ROS: See pertinent positives and negatives per HPI.  Past Medical History:  Diagnosis Date  . Arm weakness    right   . Asthma   . Diabetes insipidus ([place])   . Diabetes mellitus without complication ([place])   . DVT (deep venous thrombosis) ([place])   . Obesity   . OSA (obstructive sleep apnea)     Family History  Problem Relation Age of Onset  . Heart disease Mother   . Other Mother     clotting disorders  . Diabetes Mother   . Hypertension    . Allergies    . Deep vein thrombosis    . Sleep apnea    . Obesity    . Other Father     sudden death/cervical and lumbar disk disease  . Aneurysm Father     In his 20s smoked  . Hyperlipidemia Father     Social History   Social History  . Marital status: Married    Spouse name: N/A  . Number of children: N/A  . Years of education: N/A   Occupational History  . 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 Smoking status: Never Smoker  . Smokeless tobacco: Never Used  . Alcohol use Yes     Comment: 1 drink mo.  . Drug use: No  . Sexual activity: Yes   Other Topics Concern  . Not on file   Social History Narrative   Occupation:  days [employer] 11- 8 am  Now changed job and shift to [employer] through Thursday    Working  Ft 40 hours mostly   Married _0 Wife s/p bariatric surgery pt   Regular exercise- yes   Pt does have children   Father died suddenly 05-08-2008   Mom passes 2017  Acute rep disease after acute renal failure   Daily caffeine use one a day   2 dogs and cat.                 Outpatient Medications Prior to Visit  Medication Sig Dispense Refill  . albuterol (PROVENTIL HFA;VENTOLIN HFA) 108 (90 BASE) MCG/ACT inhaler Inhale 2 puffs into the lungs every 6 (six) hours as needed.      [name] apixaban (ELIQUIS) 5 MG TABS tablet  Take 1 tablet (5 mg total) by mouth 2 (two) times daily. 180 tablet 2  . atorvastatin (LIPITOR) 20 MG tablet Take 1 tablet (20 mg total) by mouth daily. 90 tablet 1  . canagliflozin (INVOKANA) 100 MG TABS tablet Take 1 tablet (100 mg total) by mouth daily before breakfast. 90 tablet 3  . glucose blood test strip Use as instructed 100 each 12  . Insulin Disposable Pump (V-GO 40) KIT USE ONCE DAILY 90 kit 0  . insulin lispro (HUMALOG) 100 UNIT/ML injection Use up to 80 units daily. To use with Vgo 40 system (Patient taking differently: Use up to 40 units daily. To use with Vgo 40 system) 90 mL 3  . metFORMIN (GLUCOPHAGE-XR) 500 MG 24 hr tablet TAKE 4 TABLETS DAILY WITH BREAKFAST 360 tablet 1  . naproxen (NAPROSYN) 500 MG tablet      No facility-administered medications prior to visit.      EXAM:  There were  no vitals taken for this visit.  There is no height or weight on file to calculate BMI.  GENERAL: vitals reviewed and listed above, alert, oriented, appears well hydrated and in no acute distress HEENT: atraumatic, conjunctiva  clear, no obvious abnormalities on inspection of external nose and ears OP : no lesion edema or exudate  NECK: no obvious masses on inspection palpation  LUNGS: clear to auscultation bilaterally, no wheezes, rales or rhonchi, good air movement CV: HRRR, no clubbing cyanosis or  peripheral edema nl cap refill  MS: moves all extremities without noticeable focal  abnormality PSYCH: pleasant and cooperative, no obvious depression or anxiety Lab Results  Component Value Date   WBC 9.8 10/18/2015   HGB 15.6 10/18/2015   HCT 45.3 10/18/2015   PLT 258.0 10/18/2015   GLUCOSE 243 (H) 12/22/2015   CHOL 210 (H) 10/18/2015   TRIG (H) 10/18/2015    403.0 Triglyceride is over 400; calculations on Lipids are invalid.   HDL 33.40 (L) 10/18/2015   LDLDIRECT 116.0 10/18/2015   LDLCALC 107 (H) 07/29/2009   ALT 18 10/18/2015   AST 12 10/18/2015   NA 137 12/22/2015   K  4.4 12/22/2015   CL 101 12/22/2015   CREATININE 0.69 12/22/2015   BUN 9 12/22/2015   CO2 25 12/22/2015   TSH 2.16 10/18/2015   HGBA1C 11.1 (H) 10/18/2015   MICROALBUR 18.4 (H) 10/18/2015   BP Readings from Last 3 Encounters:  12/22/15 124/64  10/31/15 138/88  10/26/15 114/78    ASSESSMENT AND PLAN:  Discussed the following assessment and plan:  No diagnosis found.  -Patient advised to return or notify health care team  if  new concerns arise.  There are no Patient Instructions on file for this visit.   [name]. Akili Corsetti M.D.

## 2016-04-25 ENCOUNTER — Telehealth: Payer: Self-pay | Admitting: Family Medicine

## 2016-04-25 ENCOUNTER — Ambulatory Visit: Payer: Managed Care, Other (non HMO) | Admitting: Internal Medicine

## 2016-04-25 NOTE — Telephone Encounter (Signed)
Pt missed appointment today.  Tried to reach the pt by telephone.  Received a message that his mailbox is full and cannot accept messages at this time.

## 2016-10-05 ENCOUNTER — Ambulatory Visit (INDEPENDENT_AMBULATORY_CARE_PROVIDER_SITE_OTHER): Payer: Managed Care, Other (non HMO) | Admitting: Internal Medicine

## 2016-10-05 ENCOUNTER — Encounter: Payer: Self-pay | Admitting: Internal Medicine

## 2016-10-05 VITALS — BP 138/88 | HR 85 | Ht 71.0 in | Wt 301.0 lb

## 2016-10-05 DIAGNOSIS — Z794 Long term (current) use of insulin: Secondary | ICD-10-CM

## 2016-10-05 DIAGNOSIS — IMO0001 Reserved for inherently not codable concepts without codable children: Secondary | ICD-10-CM

## 2016-10-05 DIAGNOSIS — E1165 Type 2 diabetes mellitus with hyperglycemia: Secondary | ICD-10-CM | POA: Diagnosis not present

## 2016-10-05 DIAGNOSIS — E669 Obesity, unspecified: Secondary | ICD-10-CM

## 2016-10-05 DIAGNOSIS — Z6841 Body Mass Index (BMI) 40.0 and over, adult: Secondary | ICD-10-CM | POA: Diagnosis not present

## 2016-10-05 LAB — POCT GLYCOSYLATED HEMOGLOBIN (HGB A1C): HEMOGLOBIN A1C: 7.6

## 2016-10-05 MED ORDER — CANAGLIFLOZIN 100 MG PO TABS: 100.0000 mg | ORAL_TABLET | Freq: Every day | ORAL | 3 refills | Status: DC

## 2016-10-05 MED ORDER — V-GO 30 KIT: PACK | 3 refills | Status: DC

## 2016-10-05 NOTE — Progress Notes (Addendum)
Subjective:     Patient ID: Troy [name], male   DOB: 02-Mar-1971, 46 y.o.   MRN: [id number]  HPI Troy [name] is a pleasant 46 y.o. man, returning for management of DM2, dx 2012, insulin-dependent, without long term complications, uncontrolled.   Last visit was 9 mo ago. He is not compliant with appts.  He was off his meds repeatedly since I last saw him, now off his VGo x 2 weeks.   His latest hemoglobin A1c levels were:  Lab Results  Component Value Date   HGBA1C 11.1 (H) 10/18/2015   HGBA1C 10.0 (H) 10/08/2013   HGBA1C 9.5 (H) 06/15/2013   He is on: He was off his meds for periods of time - now off VGo - Metformin XR 2000 mg in am (his dinnertime - works nights - started this 07/2015) - Invokana 100 mg before b'fast - started 10/2015 - VGo 40 - was not using the following settings, but 10 units (5 clicks per meal)   He checks his sugars seldom: - b'fast:  (2-4 pm) 153-271 >> 150s - before lunch: 129-150 >> n/c >> 206 (9 pm) >> 150s - after lunch: 174 >> n/c - predinner: (1:30 am before dinner) 160-346  >> n/c - bedtime: 200-300 >> 208-437 >> 150s  - No CKD: Lab Results  Component Value Date   BUN 9 12/22/2015   BUN 9 10/18/2015   CREATININE 0.69 12/22/2015   CREATININE 0.74 10/18/2015   Latest ACRs: Lab Results  Component Value Date   MICRALBCREAT 5.8 10/18/2015   MICRALBCREAT 2.0 03/16/2013   MICRALBCREAT 1.6 11/06/2012   MICRALBCREAT 2.5 07/31/2012   MICRALBCREAT 3.6 11/26/2011   MICRALBCREAT 2.3 11/28/2010   MICRALBCREAT 0.6 07/29/2009   MICRALBCREAT 9.9 07/16/2008  Not on ACEI/ARB. - He has HL: Lab Results  Component Value Date   CHOL 210 (H) 10/18/2015   HDL 33.40 (L) 10/18/2015   LDLCALC 107 (H) 07/29/2009   LDLDIRECT 116.0 10/18/2015   TRIG (H) 10/18/2015    403.0 Triglyceride is over 400; calculations on Lipids are invalid.   CHOLHDL 6 10/18/2015  He is on Lipitor 10. - Last eye exam was in 05/2012. Still did not reschedule a new one despite  multiple promptings. - Denies numbness and tingling in feet. - + OSA  - compliant with CPAP.   He had a DVT in R leg in 04/2015. He lost 45 lbs before 2017 - mostly vegan food.   Review of Systems Constitutional: + weight loss, no fatigue, no subjective hyperthermia, no subjective hypothermia Eyes: no blurry vision, no xerophthalmia ENT: no sore throat, no nodules palpated in throat, no dysphagia, no odynophagia, no hoarseness Cardiovascular: no CP/no SOB/no palpitations/no leg swelling Respiratory: no cough/no SOB/no wheezing Gastrointestinal: no N/no V/no D/no C/no acid reflux Musculoskeletal: no muscle aches/no joint aches Skin: no rashes, no hair loss Neurological: no tremors/no numbness/no tingling/no dizziness  I reviewed pt's medications, allergies, PMH, social hx, family hx, and changes were documented in the history of present illness. Otherwise, unchanged from my initial visit note.   Objective:   Physical Exam BP 138/88 (BP Location: Left Arm, Patient Position: Sitting)   Pulse 85   Ht 5\' 11"  (1.803 m)   Wt (!) 301 lb (136.5 kg)   SpO2 97%   BMI 41.98 kg/m  Body mass index is 41.98 kg/m. Wt Readings from Last 3 Encounters:  10/05/16 (!) 301 lb (136.5 kg)  12/22/15 (!) 306 lb (138.8 kg)  10/31/15 (!) 306 lb (  138.8 kg)   Constitutional: obese, in NAD Eyes: PERRLA, EOMI, no exophthalmos ENT: moist mucous membranes, no thyromegaly, no cervical lymphadenopathy Cardiovascular: RRR, No MRG Respiratory: CTA B Gastrointestinal: abdomen soft, NT, ND, BS+ Musculoskeletal: no deformities, strength intact in all 4 Skin: moist, warm, + B stasis dermatitis Neurological: no tremor with outstretched hands, DTR normal in all 4  Assessment:     1. DM2, insulin-dependent, uncontrolled, without long term complications, but with hyperglycemia  2. Obesity class 3 BMI Classification:  < 18.5 underweight   18.5-24.9 normal weight   25.0-29.9 overweight   30.0-34.9 class I  obesity   35.0-39.9 class II obesity   ? 40.0 class III obesity   Plan:     1. Patient with history uncontrolled diabetes, noncompliant with appointments. In the past, he is doing wonderful on the vegan diet, with great blood sugars and losing weight. However, he could not continue with this so we need to restart insulin and then [facility] Despite this, last HbA1c was 11.1%. At last visit, sugars were slightly better, but still high, so we moved the entire metformin dose closer to his bedtime (in a.m.). We also discussed about diet, which I think is the main determining factor for his diabetes control. - he is now on the FreeStyle libre CGM (which he loves), which we downloaded today. We reviewed the traces together with the patient. It is difficult to interpret his control since he has been out of one or another medicine in the last 4 months. His best sugars were in June when he was on all 3 of them. Reviewing the traces, I do not see low blood sugars at night except for once, on June 19 (will scan traces). Most of his sugars are high especially after waking up and eating breakfast. His highest sugar was 266. - at this visit, he mentions that his sugars improved when he was taking metformin at bedtime, changing VGo at bedtime, and taking iNVOKANA upon waking up. However, he did not stay long on this regimen, running out of one or another of his medicines. He does mention, though that when he will using all of them, he did drop his sugars during sleep to 70s or lower and woke up hungry. - We decided to continue metformin, iNVOKANA, and refilled VGo, but we will switch to VGo 30 to avoid the risk of hypoglycemia, but I advised him to use a larger bolus for a larger meal - He tells me that he is planning to [name] back to the vegan diet and in that case, he ma be even able to come off insulin - I advised him to: Patient Instructions  Please continue: - Metformin XR 2000 mg at dinnertime  - Invokana 100 mg  before b'fast   Please change: - VGo to 30: 5-6 clicks per meal  Please schedule a new eye exam.  Please return in 3 months with your sugar log.    - today, HbA1c is 7.6% (much better!) - continue checking sugars at different times of the day - check 3x a day, rotating checks - advised for yearly eye exams >> he has not scheduled one despite multiple promptings - Return to clinic in 3 mo with sugar log   2. Obesity class 3 - lost few lbs since last visit - will lose even more on vegan diet, he did prev. Lose 45 lbs which he mainly kept off  - time spent with the patient: 25 min, of which >50% was  spent in reviewing his CGM downloads, discussing his hypo- and hyper-glycemic episodes, reviewing previous labs and insulin doses and developing a plan to avoid hypo- and hyper-glycemia.   Philemon Kingdom, MD PhD [facility] Endocrinology

## 2016-10-05 NOTE — Patient Instructions (Addendum)
Please continue: - Metformin XR 2000 mg at dinnertime  - Invokana 100 mg before b'fast   Please change: - VGo to 30: 5-6 clicks per meal  Please schedule a new eye exam.  Please return in 3 months with your sugar log.

## 2016-10-18 ENCOUNTER — Other Ambulatory Visit: Payer: Self-pay | Admitting: Internal Medicine

## 2016-11-30 ENCOUNTER — Encounter: Payer: Self-pay | Admitting: Internal Medicine

## 2016-12-17 NOTE — Progress Notes (Signed)
Chief Complaint  Patient presents with  . Annual Exam    No new complaints. A1c 7.6 on 10/05/16. Blood sugars are in good range 120s-130s.     HPI: Patient  Troy [name]  46 y.o. comes in today for [place] visit  Now on pump and TCmionitor helping bring down   Works  Nights sleeps days  rx osa.  No new sx . Has been off statin cost at the time  [facility])  Willing to go back on medication.    Health Maintenance  Topic Date Due  . OPHTHALMOLOGY EXAM  12/19/2015  . URINE MICROALBUMIN  10/17/2016  . FOOT EXAM  10/25/2016  . TETANUS/TDAP  02/12/2017  . HEMOGLOBIN A1C  04/07/2017  . PNEUMOCOCCAL POLYSACCHARIDE VACCINE (2) 10/25/2020  . INFLUENZA VACCINE  Completed  . HIV Screening  Completed   Health Maintenance Review LIFESTYLE:  Exercise:   Not enough .  Tobacco/ETS: no Alcohol:   rre to ocass   Sugar beverages: water only .  Sleep:   Day  Drug use: no HH of 3   5 ..4 dogs and cat    Work:  81   10 - 33.  Am .     osa   And    Day.     ROS:  GEN/ HEENT: No fever, significant weight changes sweats headaches vision problems hearing changes, CV/ PULM; No chest pain shortness of breath cough, syncope,edema  change in exercise tolerance. GI /GU: No adominal pain, vomiting, change in bowel habits chronric diarrhea predates the   Metformin . No blood in the stool. No significant GU symptoms. SKIN/HEME: ,no acute skin rashes suspicious lesions or bleeding. No lymphadenopathy, nodules, masses.  NEURO/ PSYCH:  No neurologic signs such as weakness some numbness r foot  Left big toe no progression. No depression anxiety. IMM/ Allergy: No unusual infections.  Allergy .   REST of 12 system review negative except as per HPI   Past Medical History:  Diagnosis Date  . Arm weakness    right   . Asthma   . Diabetes insipidus ([place])   . Diabetes mellitus without complication ([place])   . DVT (deep venous thrombosis) ([place])   . Obesity   . OSA (obstructive sleep apnea)       Past Surgical History:  Procedure Laterality Date  . BACK SURGERY    . CERVICAL DISC SURGERY     C6/C7 2009  . cubital tunnel left arm     2003  . ELBOW SURGERY    . FIBULA FRACTURE SURGERY     plate & pin removed due to infection 1997  . ULNAR NERVE REPAIR      Family History  Problem Relation Age of Onset  . Heart disease Mother   . Other Mother        clotting disorders  . Diabetes Mother   . Hypertension Unknown   . Allergies Unknown   . Deep vein thrombosis Unknown   . Sleep apnea Unknown   . Obesity Unknown   . Other Father        sudden death/cervical and lumbar disk disease  . Aneurysm Father        In his 44s smoked  . Hyperlipidemia Father     Social History   Social History  . Marital status: Married    Spouse name: N/A  . Number of children: N/A  . Years of education: N/A   Occupational History  .  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 Smoking status: Never Smoker  . Smokeless tobacco: Never Used  . Alcohol use Yes     Comment: 1 drink mo.  . Drug use: No  . Sexual activity: Yes   Other Topics Concern  . None   Social History Narrative   Occupation:  days [employer] 11- 8 am  Now changed job and shift to [employer] through Thursday    Working  Ft 40 hours mostly   Married '10 10 10   ' Wife s/p bariatric surgery pt   Regular exercise- yes   Pt does have children   Father died suddenly 05/14/08   Mom passes 2017  Acute rep disease after acute renal failure   Daily caffeine use one a day   2 dogs and cat.                 Outpatient Medications Prior to Visit  Medication Sig Dispense Refill  . albuterol (PROVENTIL HFA;VENTOLIN HFA) 108 (90 BASE) MCG/ACT inhaler Inhale 2 puffs into the lungs every 6 (six) hours as needed.      [name] apixaban (ELIQUIS) 5 MG TABS tablet Take 1 tablet (5 mg total) by mouth 2 (two) times daily. 180 tablet 2  . canagliflozin (INVOKANA) 100 MG TABS tablet Take 1 tablet (100 mg  total) by mouth daily before breakfast. 90 tablet 3  . Continuous Blood Gluc Receiver (FREESTYLE LIBRE READER) DEVI     . Continuous Blood Gluc Sensor (Shaft) MISC     . glucose blood test strip Use as instructed 100 each 12  . Insulin Disposable Pump (V-GO 30) KIT Use 1x a day 90 kit 3  . insulin lispro (HUMALOG) 100 UNIT/ML injection Use up to 80 units daily. To use with Vgo 40 system (Patient taking differently: Use up to 40 units daily. To use with Vgo 40 system) 90 mL 3  . metFORMIN (GLUCOPHAGE-XR) 500 MG 24 hr tablet TAKE 4 TABLETS DAILY WITH BREAKFAST 360 tablet 1  . naproxen (NAPROSYN) 500 MG tablet     . atorvastatin (LIPITOR) 20 MG tablet Take 1 tablet (20 mg total) by mouth daily. 90 tablet 1   No facility-administered medications prior to visit.      EXAM:  BP (!) 130/92 (BP Location: Right Arm, Patient Position: Sitting, Cuff Size: Normal)   Pulse (!) 111   Temp 97.7 F (36.5 C) (Other (Comment))   Ht '5\' 10"'  (1.778 m)   Wt 298 lb 9.6 oz (135.4 kg)   BMI 42.84 kg/m   Body mass index is 42.84 kg/m. Wt Readings from Last 3 Encounters:  12/18/16 298 lb 9.6 oz (135.4 kg)  10/05/16 (!) 301 lb (136.5 kg)  12/22/15 (!) 306 lb (138.8 kg)    Physical Exam: Vital signs reviewed DJT:TSVX is a well-developed well-nourished alert cooperative    who appearsr stated age in no acute distress.  HEENT: normocephalic atraumatic , Eyes: PERRL EOM's full, conjunctiva clear, Nares: paten,t no deformity discharge or tenderness., Ears: no deformity EAC's clear TMs with normal landmarks. Mouth: clear OP, no lesions, edema.  Moist mucous membranes. Dentition in adequate repair. NECK: supple without masses, thyromegaly or bruits. CHEST/PULM:  Clear to auscultation and percussion breath sounds equal no wheeze , rales or rhonchi. No chest wall deformities or tenderness.  CV: PMI is nondisplaced, S1 S2 no gallops, murmurs, rubs. Peripheral pulses are full without  delay.No  JVD .  ABDOMEN: Bowel sounds normal nontender  No guard or rebound, no hepato splenomegal no CVA tenderness.   Extremtities:  No clubbing cyanosis or edema, no acute joint swelling or redness  chronic changes RLE from previous dvt etc   No acute findings  Feet no callous or ulcers  Right ue mild atrophy  NEURO:  Oriented x3, cranial nerves 3-12 appear to be intact, within normal limits no abnormal reflexes or asymmetrical SKIN: No acute rashes normal turgor, color, no bruising or petechiae. abd puimp no stria PSYCH: Oriented, good eye contact, no obvious depression anxiety, cognition and judgment appear normal. LN: no cervical axillary adenopathy  Lab Results  Component Value Date   WBC 9.8 10/18/2015   HGB 15.6 10/18/2015   HCT 45.3 10/18/2015   PLT 258.0 10/18/2015   GLUCOSE 243 (H) 12/22/2015   CHOL 210 (H) 10/18/2015   TRIG (H) 10/18/2015    403.0 Triglyceride is over 400; calculations on Lipids are invalid.   HDL 33.40 (L) 10/18/2015   LDLDIRECT 116.0 10/18/2015   LDLCALC 107 (H) 07/29/2009   ALT 18 10/18/2015   AST 12 10/18/2015   NA 137 12/22/2015   K 4.4 12/22/2015   CL 101 12/22/2015   CREATININE 0.69 12/22/2015   BUN 9 12/22/2015   CO2 25 12/22/2015   TSH 2.16 10/18/2015   HGBA1C 7.6 10/05/2016   MICROALBUR 18.4 (H) 10/18/2015    BP Readings from Last 3 Encounters:  12/18/16 (!) 130/92  10/05/16 138/88  12/22/15 124/64    Lab results reviewed with patient   ASSESSMENT AND PLAN:  Discussed the following assessment and plan:  Visit for preventive health examination - Plan: Basic metabolic panel, CBC with Differential/Platelet, Hepatic function panel, Lipid panel, Microalbumin / creatinine urine ratio  Morbid obesity, unspecified obesity type ([place]) - Plan: Basic metabolic panel, CBC with Differential/Platelet, Hepatic function panel, Lipid panel, Microalbumin / creatinine urine ratio  Medication management - Plan: Basic metabolic panel, CBC with  Differential/Platelet, Hepatic function panel, Lipid panel, Microalbumin / creatinine urine ratio  Chronic anticoagulation  Hyperlipidemia, unspecified hyperlipidemia type - advise go back on med - Plan: Basic metabolic panel, CBC with Differential/Platelet, Hepatic function panel, Lipid panel, Microalbumin / creatinine urine ratio, Lipid panel  Need for influenza vaccination - Plan: Flu Vaccine QUAD 6+ mos PF IM (Fluarix Quad PF)  Type 2 diabetes mellitus not at goal [facility]) - Plan: Basic metabolic panel, CBC with Differential/Platelet, Hepatic function panel, Lipid panel, Microalbumin / creatinine urine ratio Reviewed going back on the statin medicines sent in in atorvastatin 20 mg a day future orders for repeat lipid panel after on medicine 3-6 months when is convenient for him. To follow-up diabetes care with with Dr. Darnell Level Healthcare maintenance reviewed. Patient Care Team: [name], [name], MD as PCP - General Clance, Armando Reichert, MD (Pulmonary Disease) Philemon Kingdom, MD as Consulting Physician (Internal Medicine) Patient Instructions  Continue diabetes control improvement. Restart the atorvastatin as we discussed to decrease the risk of heart attack and stroke in the future.  Contact us if there are problems with this.   Preventive Care 40-64 Years, Male Preventive care refers to lifestyle choices and visits with your health care provider that can promote health and wellness. What does preventive care include?  A yearly physical exam. This is also called an annual well check.  Dental exams once or twice a year.  Routine eye exams. Ask your health care provider how often you should have your eyes checked.  Personal lifestyle choices, including: ? Daily care of your teeth and gums. ? Regular physical activity. ? Eating a healthy diet. ? Avoiding tobacco and drug use. ? Limiting alcohol use. ? Practicing safe sex. ? Taking low-dose aspirin every day starting at age 66. What  happens during an annual well check? The services and screenings done by your health care provider during your annual well check will depend on your age, overall health, lifestyle risk factors, and family history of disease. Counseling Your health care provider may ask you questions about your:  Alcohol use.  Tobacco use.  Drug use.  Emotional well-being.  Home and relationship well-being.  Sexual activity.  Eating habits.  Work and work Statistician.  Screening You may have the following tests or measurements:  Height, weight, and BMI.  Blood pressure.  Lipid and cholesterol levels. These may be checked every 5 years, or more frequently if you are over 96 years old.  Skin check.  Lung cancer screening. You may have this screening every year starting at age 71 if you have a 30-pack-year history of smoking and currently smoke or have quit within the past 15 years.  Fecal occult blood test (FOBT) of the stool. You may have this test every year starting at age 69.  Flexible sigmoidoscopy or colonoscopy. You may have a sigmoidoscopy every 5 years or a colonoscopy every 10 years starting at age 79.  Prostate cancer screening. Recommendations will vary depending on your family history and other risks.  Hepatitis C blood test.  Hepatitis B blood test.  Sexually transmitted disease (STD) testing.  Diabetes screening. This is done by checking your blood sugar (glucose) after you have not eaten for a while (fasting). You may have this done every 1-3 years.  Discuss your test results, treatment options, and if necessary, the need for more tests with your health care provider. Vaccines Your health care provider may recommend certain vaccines, such as:  Influenza vaccine. This is recommended every year.  Tetanus, diphtheria, and acellular pertussis (Tdap, Td) vaccine. You may need a Td booster every 10 years.  Varicella vaccine. You may need this if you have not been  vaccinated.  Zoster vaccine. You may need this after age 34.  Measles, mumps, and rubella (MMR) vaccine. You may need at least one dose of MMR if you were born in 1957 or later. You may also need a second dose.  Pneumococcal 13-valent conjugate (PCV13) vaccine. You may need this if you have certain conditions and have not been vaccinated.  Pneumococcal polysaccharide (PPSV23) vaccine. You may need one or two doses if you smoke cigarettes or if you have certain conditions.  Meningococcal vaccine. You may need this if you have certain conditions.  Hepatitis A vaccine. You may need this if you have certain conditions or if you travel or work in places where you may be exposed to hepatitis A.  Hepatitis B vaccine. You may need this if you have certain conditions or if you travel or work in places where you may be exposed to hepatitis B.  Haemophilus influenzae type b (Hib) vaccine. You may need this if you have certain risk factors.  Talk to your health care provider about which screenings and vaccines you need and how often you need them. This information is not intended to replace advice given to you by your health care provider. Make sure you discuss any questions you have with your health care provider. Document Released: 03/25/2015 Document Revised: 11/16/2015 Document Reviewed:  12/28/2014 Elsevier Interactive Patient Education  2017 [place] [name] M.D.

## 2016-12-18 ENCOUNTER — Encounter: Payer: Self-pay | Admitting: Internal Medicine

## 2016-12-18 ENCOUNTER — Ambulatory Visit (INDEPENDENT_AMBULATORY_CARE_PROVIDER_SITE_OTHER): Payer: Managed Care, Other (non HMO) | Admitting: Internal Medicine

## 2016-12-18 VITALS — BP 130/92 | HR 111 | Temp 97.7°F | Ht 70.0 in | Wt 298.6 lb

## 2016-12-18 DIAGNOSIS — E785 Hyperlipidemia, unspecified: Secondary | ICD-10-CM | POA: Diagnosis not present

## 2016-12-18 DIAGNOSIS — Z0000 Encounter for general adult medical examination without abnormal findings: Secondary | ICD-10-CM | POA: Diagnosis not present

## 2016-12-18 DIAGNOSIS — Z7901 Long term (current) use of anticoagulants: Secondary | ICD-10-CM

## 2016-12-18 DIAGNOSIS — E119 Type 2 diabetes mellitus without complications: Secondary | ICD-10-CM | POA: Diagnosis not present

## 2016-12-18 DIAGNOSIS — Z23 Encounter for immunization: Secondary | ICD-10-CM | POA: Diagnosis not present

## 2016-12-18 DIAGNOSIS — Z79899 Other long term (current) drug therapy: Secondary | ICD-10-CM | POA: Diagnosis not present

## 2016-12-18 LAB — LDL CHOLESTEROL, DIRECT: Direct LDL: 112 mg/dL

## 2016-12-18 LAB — CBC WITH DIFFERENTIAL/PLATELET
BASOS ABS: 0.1 10*3/uL (ref 0.0–0.1)
Basophils Relative: 0.6 % (ref 0.0–3.0)
EOS ABS: 0.1 10*3/uL (ref 0.0–0.7)
Eosinophils Relative: 1.1 % (ref 0.0–5.0)
HCT: 48.7 % (ref 39.0–52.0)
HEMOGLOBIN: 16.6 g/dL (ref 13.0–17.0)
LYMPHS ABS: 6 10*3/uL — AB (ref 0.7–4.0)
Lymphocytes Relative: 52.9 % — ABNORMAL HIGH (ref 12.0–46.0)
MCHC: 34.1 g/dL (ref 30.0–36.0)
MCV: 95.6 fl (ref 78.0–100.0)
MONO ABS: 0.6 10*3/uL (ref 0.1–1.0)
Monocytes Relative: 5.6 % (ref 3.0–12.0)
NEUTROS PCT: 39.8 % — AB (ref 43.0–77.0)
Neutro Abs: 4.5 10*3/uL (ref 1.4–7.7)
Platelets: 237 10*3/uL (ref 150.0–400.0)
RBC: 5.1 Mil/uL (ref 4.22–5.81)
RDW: 13.1 % (ref 11.5–15.5)
WBC: 11.3 10*3/uL — ABNORMAL HIGH (ref 4.0–10.5)

## 2016-12-18 LAB — LIPID PANEL
CHOL/HDL RATIO: 6
CHOLESTEROL: 233 mg/dL — AB (ref 0–200)
HDL: 36.2 mg/dL — ABNORMAL LOW (ref >39.00)
Triglycerides: 660 mg/dL — ABNORMAL HIGH (ref 0.0–149.0)

## 2016-12-18 LAB — BASIC METABOLIC PANEL
BUN: 15 mg/dL (ref 6–23)
CALCIUM: 9.8 mg/dL (ref 8.4–10.5)
CO2: 31 mEq/L (ref 19–32)
CREATININE: 0.73 mg/dL (ref 0.40–1.50)
Chloride: 95 mEq/L — ABNORMAL LOW (ref 96–112)
GFR: 122.74 mL/min (ref >60.00)
GLUCOSE: 195 mg/dL — AB (ref 70–99)
POTASSIUM: 4 meq/L (ref 3.5–5.1)
Sodium: 136 mEq/L (ref 135–145)

## 2016-12-18 LAB — HEPATIC FUNCTION PANEL
ALT: 13 U/L (ref 0–53)
AST: 7 U/L (ref 0–37)
Albumin: 4.3 g/dL (ref 3.5–5.2)
Alkaline Phosphatase: 49 U/L (ref 39–117)
BILIRUBIN DIRECT: 0.1 mg/dL (ref 0.0–0.3)
BILIRUBIN TOTAL: 0.6 mg/dL (ref 0.2–1.2)
Total Protein: 7.2 g/dL (ref 6.0–8.3)

## 2016-12-18 LAB — MICROALBUMIN / CREATININE URINE RATIO
CREATININE, U: 85.1 mg/dL
MICROALB UR: 1.9 mg/dL (ref 0.0–1.9)
Microalb Creat Ratio: 2.2 mg/g (ref 0.0–30.0)

## 2016-12-18 MED ORDER — ATORVASTATIN CALCIUM 20 MG PO TABS: 20.0000 mg | ORAL_TABLET | Freq: Every day | ORAL | 1 refills | Status: DC

## 2016-12-18 NOTE — Patient Instructions (Signed)
Continue diabetes control improvement. Restart the atorvastatin as we discussed to decrease the risk of heart attack and stroke in the future.  Contact us if there are problems with this.   Preventive Care 40-64 Years, Male Preventive care refers to lifestyle choices and visits with your health care provider that can promote health and wellness. What does preventive care include?  A yearly physical exam. This is also called an annual well check.  Dental exams once or twice a year.  Routine eye exams. Ask your health care provider how often you should have your eyes checked.  Personal lifestyle choices, including: ? Daily care of your teeth and gums. ? Regular physical activity. ? Eating a healthy diet. ? Avoiding tobacco and drug use. ? Limiting alcohol use. ? Practicing safe sex. ? Taking low-dose aspirin every day starting at age 83. What happens during an annual well check? The services and screenings done by your health care provider during your annual well check will depend on your age, overall health, lifestyle risk factors, and family history of disease. Counseling Your health care provider may ask you questions about your:  Alcohol use.  Tobacco use.  Drug use.  Emotional well-being.  Home and relationship well-being.  Sexual activity.  Eating habits.  Work and work Statistician.  Screening You may have the following tests or measurements:  Height, weight, and BMI.  Blood pressure.  Lipid and cholesterol levels. These may be checked every 5 years, or more frequently if you are over 90 years old.  Skin check.  Lung cancer screening. You may have this screening every year starting at age 69 if you have a 30-pack-year history of smoking and currently smoke or have quit within the past 15 years.  Fecal occult blood test (FOBT) of the stool. You may have this test every year starting at age 65.  Flexible sigmoidoscopy or colonoscopy. You may have a  sigmoidoscopy every 5 years or a colonoscopy every 10 years starting at age 18.  Prostate cancer screening. Recommendations will vary depending on your family history and other risks.  Hepatitis C blood test.  Hepatitis B blood test.  Sexually transmitted disease (STD) testing.  Diabetes screening. This is done by checking your blood sugar (glucose) after you have not eaten for a while (fasting). You may have this done every 1-3 years.  Discuss your test results, treatment options, and if necessary, the need for more tests with your health care provider. Vaccines Your health care provider may recommend certain vaccines, such as:  Influenza vaccine. This is recommended every year.  Tetanus, diphtheria, and acellular pertussis (Tdap, Td) vaccine. You may need a Td booster every 10 years.  Varicella vaccine. You may need this if you have not been vaccinated.  Zoster vaccine. You may need this after age 59.  Measles, mumps, and rubella (MMR) vaccine. You may need at least one dose of MMR if you were born in 1957 or later. You may also need a second dose.  Pneumococcal 13-valent conjugate (PCV13) vaccine. You may need this if you have certain conditions and have not been vaccinated.  Pneumococcal polysaccharide (PPSV23) vaccine. You may need one or two doses if you smoke cigarettes or if you have certain conditions.  Meningococcal vaccine. You may need this if you have certain conditions.  Hepatitis A vaccine. You may need this if you have certain conditions or if you travel or work in places where you may be exposed to hepatitis A.  Hepatitis B  vaccine. You may need this if you have certain conditions or if you travel or work in places where you may be exposed to hepatitis B.  Haemophilus influenzae type b (Hib) vaccine. You may need this if you have certain risk factors.  Talk to your health care provider about which screenings and vaccines you need and how often you need  them. This information is not intended to replace advice given to you by your health care provider. Make sure you discuss any questions you have with your health care provider. Document Released: 03/25/2015 Document Revised: 11/16/2015 Document Reviewed: 12/28/2014 Elsevier Interactive Patient Education  2017 [employer].

## 2017-01-04 ENCOUNTER — Encounter: Payer: Self-pay | Admitting: Internal Medicine

## 2017-01-04 ENCOUNTER — Ambulatory Visit (INDEPENDENT_AMBULATORY_CARE_PROVIDER_SITE_OTHER): Payer: Managed Care, Other (non HMO) | Admitting: Internal Medicine

## 2017-01-04 VITALS — BP 122/82 | HR 89 | Wt 299.0 lb

## 2017-01-04 DIAGNOSIS — Z794 Long term (current) use of insulin: Secondary | ICD-10-CM

## 2017-01-04 DIAGNOSIS — E1165 Type 2 diabetes mellitus with hyperglycemia: Secondary | ICD-10-CM

## 2017-01-04 DIAGNOSIS — Z6841 Body Mass Index (BMI) 40.0 and over, adult: Secondary | ICD-10-CM | POA: Diagnosis not present

## 2017-01-04 LAB — POCT GLYCOSYLATED HEMOGLOBIN (HGB A1C): HEMOGLOBIN A1C: 8.3

## 2017-01-04 MED ORDER — INSULIN LISPRO 100 UNIT/ML ~~LOC~~ SOLN: SUBCUTANEOUS | 3 refills | Status: DC

## 2017-01-04 NOTE — Patient Instructions (Addendum)
Please continue: - Metformin XR 2000 mg at dinnertime  - Invokana 100 mg before b'fast   Increase  - [id number] to: 5-6 clicks per meal  Please return in 3 months with your sugar log.

## 2017-01-04 NOTE — Progress Notes (Signed)
Subjective:     Patient ID: Troy [name], male   DOB: [birth year], 46 y.o.   MRN: [id number]  HPI Mr. Sopp is a pleasant 46 y.o. man, returning for management of DM2, dx 2012, insulin-dependent, without long term complications, uncontrolled. Last visit 3 mo ago.  He changed his diet since 2 weeks ago after he saw PCP >> no sweets anymore >> now fruit.   Also, he started to eat less meat, more veggies.  His latest hemoglobin A1c levels were:  Lab Results  Component Value Date   HGBA1C 7.6 10/05/2016   HGBA1C 11.1 (H) 10/18/2015   HGBA1C 10.0 (H) 10/08/2013   He is on: - Metformin XR 2000 mg in am (his dinnertime - works nights - started this 07/2015) - Invokana 100 mg before b'fast - started 10/2015 - VGo 40 >> 30:  not using: uses 2-3 clicks instead!!  He now has a Freestyle Libre CGM >> reviewed downloaded traces: - b'fast:  (2-4 pm) 153-271 >> 150s >> 118-210 - 2h after b'fast:  203-298 - before lunch: 129-150 >> n/c >> 206 (9 pm) >> 150s >> 165-174 - 2h after lunch: 215-259 - predinner: (1:30 am before dinner) 160-346  >> n/c >> 161-185 - bedtime: 200-300 >> 208-437 >> 150s >> 183-270  - No CKD: Lab Results  Component Value Date   BUN 15 12/18/2016   BUN 9 12/22/2015   CREATININE 0.73 12/18/2016   CREATININE 0.69 12/22/2015   Latest ACRs: Lab Results  Component Value Date   MICRALBCREAT 2.2 12/18/2016   MICRALBCREAT 5.8 10/18/2015   MICRALBCREAT 2.0 03/16/2013   MICRALBCREAT 1.6 11/06/2012   MICRALBCREAT 2.5 07/31/2012   MICRALBCREAT 3.6 11/26/2011   MICRALBCREAT 2.3 11/28/2010   MICRALBCREAT 0.6 07/29/2009   MICRALBCREAT 9.9 07/16/2008  Not on ACEI or ARB. - + HL: Lab Results  Component Value Date   CHOL 233 (H) 12/18/2016   HDL 36.20 (L) 12/18/2016   LDLCALC 107 (H) 07/29/2009   LDLDIRECT 112.0 12/18/2016   TRIG (H) 12/18/2016    660.0 Triglyceride is over 400; calculations on Lipids are invalid.   CHOLHDL 6 12/18/2016  On Lipitor 10 >> was off  for >1 year >> restarted recently 20 mg daily. - Last eye exam was in 2016. Still did not reschedule a new one despite multiple promptings. - no numbness and tingling in feet. - + OSA  - compliant with CPAP.   He had a DVT in R leg in 04/2015. On Eloquis. He lost 45 lbs before 2017 - mostly vegan food.   Review of Systems Constitutional: +no weight loss or gain, no fatigue, no subjective hyperthermia, no subjective hypothermia Eyes: no blurry vision, no xerophthalmia ENT: no sore throat, no nodules palpated in throat, no dysphagia, no odynophagia, no hoarseness Cardiovascular: no CP/no SOB/no palpitations/no leg swelling Respiratory: no cough/no SOB/no wheezing Gastrointestinal: no N/no V/no D/no C/no acid reflux Musculoskeletal: no muscle aches/no joint aches Skin: no rashes, no hair loss Neurological: no tremors/no numbness/no tingling/no dizziness  I reviewed pt's medications, allergies, PMH, social hx, family hx, and changes were documented in the history of present illness. Otherwise, unchanged from my initial visit note.    Objective:   Physical Exam BP 122/82 (BP Location: Left Arm, Patient Position: Sitting)   Pulse 89   Wt 299 lb (135.6 kg)   SpO2 96%   BMI 42.90 kg/m  Body mass index is 42.9 kg/m. Wt Readings from Last 3 Encounters:  01/04/17 299 lb (135.6  kg)  12/18/16 298 lb 9.6 oz (135.4 kg)  10/05/16 (!) 301 lb (136.5 kg)   Constitutional: obese, in NAD Eyes: PERRLA, EOMI, no exophthalmos ENT: moist mucous membranes, no thyromegaly, no cervical lymphadenopathy Cardiovascular: RRR, No MRG, + RLE edema (post DVT)and wears a compression hose  Respiratory: CTA B Gastrointestinal: abdomen soft, NT, ND, BS+ Musculoskeletal: no deformities, strength intact in all 4 Skin: moist, warm, no rashes Neurological: no tremor with outstretched hands, DTR normal in all 4   Assessment:     1. DM2, insulin-dependent, uncontrolled, without long term complications, but with  hyperglycemia  2. Obesity class 3 BMI Classification:  < 18.5 underweight   18.5-24.9 normal weight   25.0-29.9 overweight   30.0-34.9 class I obesity   35.0-39.9 class II obesity   ? 40.0 class III obesity   Plan:     1. Patient with history uncontrolled diabetes, on a VGo 30 insulin patch pump, with still poor control at this visit due to not bolusing enough with meals. His sugars after meals are almost always in the 200s as he is not using the recommended number of clicks per meal. At last visit, I advised him to use 5-6 but he is only using 2-3 (this is the equivalent of 4-6 units of insulin per meal), which is definitely not enough. - We'll increase his insulin with meals, but I do not feel that we absolutely need to change to VGo 40 quite yet, until he starts to bolusing more with meals. We may need to do this at next visit. - Another issue is that he is using insulin from 2015 and we discussed that this may be expired. I sent a new prescription to his pharmacy. - Continue to use the FreeStyle libre CGM and we discussed about the new libre CGM that lasts 14 days. He will let me know when he needs this prescription sent to his pharmacy. - I advised him to: Patient Instructions  Please continue: - Metformin XR 2000 mg at dinnertime  - Invokana 100 mg before b'fast   Increase: - [id number] to: 5-6 clicks per meal  Please return in 3 months with your sugar log.    - today, HbA1c is 8.3% (higher) - continue checking sugars at different times of the day - check 3x a day, rotating checks - Again advised for yearly eye exams >> he is not UTD  - UTD with flu shot - Return to clinic in 3 mo with sugar log   2. Obesity class 3 - His weight is fairly stable since last visit but he already started to change his diet in a positive way in the last 2 weeks and I strongly advised him to continue  Philemon Kingdom, MD PhD [facility] Endocrinology

## 2017-04-05 ENCOUNTER — Ambulatory Visit: Payer: Self-pay | Admitting: Internal Medicine

## 2017-05-26 ENCOUNTER — Other Ambulatory Visit: Payer: Self-pay | Admitting: Internal Medicine

## 2017-06-10 ENCOUNTER — Ambulatory Visit (INDEPENDENT_AMBULATORY_CARE_PROVIDER_SITE_OTHER): Payer: Managed Care, Other (non HMO) | Admitting: Internal Medicine

## 2017-06-10 ENCOUNTER — Encounter: Payer: Self-pay | Admitting: Internal Medicine

## 2017-06-10 VITALS — BP 130/84 | HR 96 | Ht 70.0 in | Wt 302.6 lb

## 2017-06-10 DIAGNOSIS — Z6841 Body Mass Index (BMI) 40.0 and over, adult: Secondary | ICD-10-CM

## 2017-06-10 DIAGNOSIS — Z794 Long term (current) use of insulin: Secondary | ICD-10-CM

## 2017-06-10 DIAGNOSIS — E1165 Type 2 diabetes mellitus with hyperglycemia: Secondary | ICD-10-CM | POA: Diagnosis not present

## 2017-06-10 LAB — POCT GLYCOSYLATED HEMOGLOBIN (HGB A1C): Hemoglobin A1C: 8.7

## 2017-06-10 MED ORDER — FREESTYLE LIBRE 14 DAY READER DEVI: 1.0000 | Freq: Once | 1 refills | Status: AC

## 2017-06-10 MED ORDER — V-GO 30 KIT: PACK | 3 refills | Status: DC

## 2017-06-10 MED ORDER — SEMAGLUTIDE(0.25 OR 0.5MG/DOS) 2 MG/1.5ML ~~LOC~~ SOPN: 0.5000 mg | PEN_INJECTOR | SUBCUTANEOUS | 5 refills | Status: DC

## 2017-06-10 MED ORDER — INSULIN LISPRO 100 UNIT/ML ~~LOC~~ SOLN: SUBCUTANEOUS | 3 refills | Status: DC

## 2017-06-10 MED ORDER — FREESTYLE LIBRE 14 DAY SENSOR MISC: 1.0000 | 11 refills | Status: DC

## 2017-06-10 NOTE — Progress Notes (Signed)
Subjective:     Patient ID: Troy [name], male   DOB: 03-Aug-1970, 47 y.o.   MRN: [id number]  HPI Troy [name] is a pleasant 47 y.o. man, returning for management of DM2, dx 2012, insulin-dependent, without long term complications, uncontrolled. Last visit 5.5 months ago.  His latest hemoglobin A1c levels were:  Lab Results  Component Value Date   HGBA1C 8.3 01/04/2017   HGBA1C 7.6 10/05/2016   HGBA1C 11.1 (H) 10/18/2015   He is on: - Metformin XR 2000 mg in am (his dinnertime - works nights -started 07/2015 - Invokana 100 mg before b'fast -started 10/2015 - VGo 40 >> 30: 2-3 clicks per meal >> 5-6 clicks per meal He was on Victoza before >> no SEs.   CBGs checked with his freestyle libre, for which she transcribed the values: - b'fast: 153-271 >> 150s >> 118-210 >> 150-220 - 2h after b'fast:  203-298 - before lunch:  206 >> 150s >> 165-174 - 2h after lunch: 215-259 - predinner: 160-346  >> n/c >> 161-185 - bedtime: 208-437 >> 150s >> 183-270 Lowest: 113  -No CKD: Lab Results  Component Value Date   BUN 15 12/18/2016   BUN 9 12/22/2015   CREATININE 0.73 12/18/2016   CREATININE 0.69 12/22/2015   Latest ACRs: Lab Results  Component Value Date   MICRALBCREAT 2.2 12/18/2016   MICRALBCREAT 5.8 10/18/2015   MICRALBCREAT 2.0 03/16/2013   MICRALBCREAT 1.6 11/06/2012   MICRALBCREAT 2.5 07/31/2012   MICRALBCREAT 3.6 11/26/2011   MICRALBCREAT 2.3 11/28/2010   MICRALBCREAT 0.6 07/29/2009   MICRALBCREAT 9.9 07/16/2008  Not on ACE inhibitor or ARB. -+ HL: Lab Results  Component Value Date   CHOL 233 (H) 12/18/2016   HDL 36.20 (L) 12/18/2016   LDLCALC 107 (H) 07/29/2009   LDLDIRECT 112.0 12/18/2016   TRIG (H) 12/18/2016    660.0 Triglyceride is over 400; calculations on Lipids are invalid.   CHOLHDL 6 12/18/2016  On Lipitor 20. - Last eye exam was in 2016.  He did not reschedule this despite multiple promptings. - Denies numbness and tingling in feet. -+ OSA-compliant  with CPAP  He had a DVT in R leg in 04/2015.  On Eliquis. He lost 45 lbs before 2017 - mostly vegan food.   Review of Systems Constitutional: no weight gain/no weight loss, no fatigue, no subjective hyperthermia, no subjective hypothermia Eyes: no blurry vision, no xerophthalmia ENT: no sore throat, no nodules palpated in throat, no dysphagia, no odynophagia, no hoarseness Cardiovascular: no CP/no SOB/no palpitations/no leg swelling Respiratory: no cough/no SOB/no wheezing Gastrointestinal: no N/no V/no D/no C/no acid reflux Musculoskeletal: no muscle aches/no joint aches Skin: no rashes, no hair loss Neurological: no tremors/no numbness/no tingling/no dizziness  I reviewed pt's medications, allergies, PMH, social hx, family hx, and changes were documented in the history of present illness. Otherwise, unchanged from my initial visit note.  Objective:   Physical Exam BP 130/84   Pulse 96   Ht 5\' 10"  (1.778 m)   Wt (!) 302 lb 9.6 oz (137.3 kg)   SpO2 96%   BMI 43.42 kg/m  Body mass index is 43.42 kg/m. Wt Readings from Last 3 Encounters:  06/10/17 (!) 302 lb 9.6 oz (137.3 kg)  01/04/17 299 lb (135.6 kg)  12/18/16 298 lb 9.6 oz (135.4 kg)   Constitutional: overweight, in NAD Eyes: PERRLA, EOMI, no exophthalmos ENT: moist mucous membranes, no thyromegaly, no cervical lymphadenopathy Cardiovascular: Tachycardia, RR, No MRG, + RLE edema (post DVT)and wears a  compression hose  Respiratory: CTA B Gastrointestinal: abdomen soft, NT, ND, BS+ Musculoskeletal: no deformities, strength intact in all 4 Skin: moist, warm, no rashes Neurological: no tremor with outstretched hands, DTR normal in all 4   Assessment:     1. DM2, insulin-dependent, uncontrolled, without long term complications, but with hyperglycemia  2. Obesity class 3 BMI Classification:  < 18.5 underweight   18.5-24.9 normal weight   25.0-29.9 overweight   30.0-34.9 class I obesity   35.0-39.9 class II  obesity   ? 40.0 class III obesity   Plan:     1. Patient with history of uncontrolled diabetes, on a VGo 30 insulin patch pump, with still poor control at last visit due to not bolusing enough with meals.  His sugars were almost always in the 200s as he was not using the recommended number of clicks per meal.  He was only using 2-3 compared to the recommended 5-6.  At last visit, we increased his insulin with meals.  I also refilled his insulin at that time since he was using insulin from 2015.  We continued his freestyle libre CGM. -At this visit, sugars are still quite high, despite him trying to improve his diet to include more fruits and vegetables and less meat and fatty foods -At this point, I suggested to try a GLP-1 receptor agonist while continuing the current regimen.  He agrees to try Ozempic.  We will start with a low dose for a month and advance as tolerated. -Discussed about benefits and possible side effects -Given coupon card for Ozempic -Discussed that he may need to reduce the insulin by reducing the number of clicks he takes for meal after starting Ozempic. - I advised him to: Patient Instructions  Please continue: - Metformin XR 2000 mg at dinnertime  - Invokana 100 mg before b'fast  - VGo 30: 5-6 clicks per meal  Please start Ozempic 0.25 mg weekly x 4 weeks, then increase to 0.5 mg weekly.   You will likely need to decrease your insulin when you start Ozempic.  Please return in 3 months with your sugar log.    - today, HbA1c is 8.7% (higher) - continue checking sugars at different times of the day - check 3x a day, rotating checks - Again advised for yearly eye exams >> he is not UTD - Return to clinic in 3 mo with sugar log   2. Obesity class 3 -He continues to gain weight quite reported improvement in diet -Ozempic will help with weight loss also  Philemon Kingdom, MD PhD [facility]

## 2017-06-10 NOTE — Patient Instructions (Addendum)
Please continue: - Metformin XR 2000 mg at dinnertime  - Invokana 100 mg before b'fast  - VGo 30: 5-6 clicks per meal  Please start Ozempic 0.25 mg weekly x 4 weeks, then increase to 0.5 mg weekly.   You will likely need to decrease your insulin when you start Ozempic.  Please return in 3 months with your sugar log.

## 2017-07-19 NOTE — Progress Notes (Signed)
Chief Complaint  Patient presents with  . Annual Exam    Pt states that he has noticed his hands are staying cold more frequent / Issues with ED / Asthma, needs new inhalers    HPI: Patient  Troy [name]  47 y.o. comes in today for Preventive Health Care visit  Dm    Per  Dr Darnell Level   Getting some better  BP  No meds controlled  Hands get cold ealiser than usually   Mixing   Meat    Cold exposures but no color changes  Refill asthma  inhaler .  Rare neededAsthma   Attack after mowing grass.  ED   Sx partial er  Problematic  X 6 mos   No other changes  Neuro sx  Left toe nail thickened for a while and no  Pain  Trauma  Some  Pulsing in ear when standing up   Quickly but no syncope    Health Maintenance  Topic Date Due  . OPHTHALMOLOGY EXAM  12/19/2015  . FOOT EXAM  10/25/2016  . TETANUS/TDAP  02/12/2017  . INFLUENZA VACCINE  10/10/2017  . HEMOGLOBIN A1C  12/10/2017  . URINE MICROALBUMIN  12/18/2017  . PNEUMOCOCCAL POLYSACCHARIDE VACCINE (2) 10/25/2020  . HIV Screening  Completed   Health Maintenance Review LIFESTYLE:  Exercise:  Walks more  In current job  Right leg and  Right arm old injury neuropathy changes  Tobacco/ETS: n Alcohol:  Sugar beverages: no Sleep: day Drug use: no HH of 2 Work:  [employer]     ROS:  GEN/ HEENT: No fever, significant weight changes sweats headaches vision problems hearing changes, CV/ PULM; No chest pain shortness of breath cough, syncope,edema  change in exercise tolerance. GI /GU: No adominal pain, vomiting, change in bowel habits. No blood in the stool. No significant GU symptoms. SKIN/HEME: ,no acute skin rashes suspicious lesions or bleeding. No lymphadenopathy, nodules, masses.  NEURO/ PSYCH:  No neurologic signs such as weakness numbness. No depression anxiety. IMM/ Allergy: No unusual infections.  Allergy .   REST of 12 system review negative except as per HPI   Past Medical History:  Diagnosis Date  . Arm weakness    right   . Asthma   . Diabetes insipidus ([place])   . Diabetes mellitus without complication ([place])   . DVT (deep venous thrombosis) ([place])   . Obesity   . OSA (obstructive sleep apnea)     Past Surgical History:  Procedure Laterality Date  . BACK SURGERY    . CERVICAL DISC SURGERY     C6/C7 2009  . cubital tunnel left arm     2003  . ELBOW SURGERY    . FIBULA FRACTURE SURGERY     plate & pin removed due to infection 1997  . ULNAR NERVE REPAIR      Family History  Problem Relation Age of Onset  . Heart disease Mother   . Other Mother        clotting disorders  . Diabetes Mother   . Hypertension Unknown   . Allergies Unknown   . Deep vein thrombosis Unknown   . Sleep apnea Unknown   . Obesity Unknown   . Other Father        sudden death/cervical and lumbar disk disease  . Aneurysm Father        In his 32s smoked  . Hyperlipidemia Father     Social History   Socioeconomic History  .  Marital status: Married    Spouse name: Not on file  . Number of children: Not on file  . Years of education: Not on file  . Highest education level: Not on file  Occupational History  . Occupation: Hydrographic surveyor: [employer]  Social Needs  . Financial resource strain: Not on file  . Food insecurity:    Worry: Not on file    Inability: Not on file  . Transportation needs:    Medical: Not on file    Non-medical: Not on file  Tobacco Use  . Smoking status: Never Smoker  . Smokeless tobacco: Never Used  Substance and Sexual Activity  . Alcohol use: Yes    Comment: 1 drink mo.  . Drug use: No  . Sexual activity: Yes  Lifestyle  . Physical activity:    Days per week: Not on file    Minutes per session: Not on file  . Stress: Not on file  Relationships  . Social connections:    Talks on phone: Not on file    Gets together: Not on file    Attends religious service: Not on file    Active member of club or organization: Not on file    Attends meetings of  clubs or organizations: Not on file    Relationship status: Not on file  Other Topics Concern  . Not on file  Social History Narrative   Occupation:  days [employer] 11- 8 am  Now changed job and shift to [employer] through Thursday    Working  Ft 40 hours mostly   Married '10 10 10   ' Wife s/p bariatric surgery pt   Regular exercise- yes   Pt does have children   Father died suddenly 20-May-2008   Mom passes 2017  Acute rep disease after acute renal failure   Daily caffeine use one a day   2 dogs and cat.              Outpatient Medications Prior to Visit  Medication Sig Dispense Refill  . apixaban (ELIQUIS) 5 MG TABS tablet Take 1 tablet (5 mg total) by mouth 2 (two) times daily. 180 tablet 2  . canagliflozin (INVOKANA) 100 MG TABS tablet Take 1 tablet (100 mg total) by mouth daily before breakfast. 90 tablet 3  . Continuous Blood Gluc Sensor (FREESTYLE LIBRE 14 DAY SENSOR) MISC 1 each by Does not apply route every 14 (fourteen) days. Change every 2 weeks 2 each 11  . glucose blood test strip Use as instructed 100 each 12  . Insulin Disposable Pump (V-GO 30) KIT Use 1x a day 90 kit 3  . insulin lispro (HUMALOG) 100 UNIT/ML injection Use up to 80 units daily - Vgo 30 system 90 mL 3  . metFORMIN (GLUCOPHAGE-XR) 500 MG 24 hr tablet TAKE 4 TABLETS DAILY WITH BREAKFAST 360 tablet 1  . Semaglutide (OZEMPIC) 0.25 or 0.5 MG/DOSE SOPN Inject 0.5 mg into the skin once a week. 2 pen 5  . albuterol (PROVENTIL HFA;VENTOLIN HFA) 108 (90 BASE) MCG/ACT inhaler Inhale 2 puffs into the lungs every 6 (six) hours as needed.      [name] atorvastatin (LIPITOR) 20 MG tablet Take 1 tablet (20 mg total) by mouth daily. 90 tablet 1  . naproxen (NAPROSYN) 500 MG tablet      No facility-administered medications prior to visit.      EXAM:  BP 108/78 (BP Location: Right Arm, Patient  Position: Sitting, Cuff Size: Large)   Pulse 92   Temp 98.2 F (36.8 C) (Oral)   Ht 5' 10.5" (1.791 m)   Wt 300 lb (136.1  kg)   BMI 42.44 kg/m   Body mass index is 42.44 kg/m. Wt Readings from Last 3 Encounters:  07/22/17 300 lb (136.1 kg)  06/10/17 (!) 302 lb 9.6 oz (137.3 kg)  01/04/17 299 lb (135.6 kg)    Physical Exam: Vital signs reviewed MIW:OEHO is a well-developed well-nourished alert cooperative    who appearsr stated age in no acute distress. Glasses  HEENT: normocephalic atraumatic , Eyes: PERRL EOM's full, conjunctiva clear, Nares: paten,t no deformity discharge or tenderness., Ears: no deformity EAC's clear TMs with normal landmarks. Mouth: clear OP, no lesions, edema.  Moist mucous membranes. Dentition in adequate repair. NECK: supple without masses, thyromegaly or bruits. CHEST/PULM:  Clear to auscultation and percussion breath sounds equal no wheeze , rales or rhonchi.  Breast: normal by inspection . No dimpling, discharge, masses, tenderness or discharge . CV: PMI is nondisplaced, S1 S2 no gallops, murmurs, rubs. Peripheral pulses are full without delay.No JVD .   ABDOMEN: Bowel sounds normal nontender  No guard or rebound, no hepato splenomegal no CVA tenderness.  Diastasis    . Extremtities:  No clubbing cyanosis or edema, no acute joint swelling or redness atrophy  rihg t face   Right  Le chonric changes   No ulcers or acute swelling  NEURO:  Oriented x3, cranial nerves 3-12 appear to be intact, no obvious focal weakness,gait within normal limits no abnormal reflexes or asymmetrical SKIN: No acute rashes normal turgor, color, no bruising or petechiae.   r great toenail distal thickening   PSYCH: Oriented, good eye contact, no obvious depression anxiety, cognition and judgment appear normal. LN: no cervical axillary inguinal adenopathy  Lab Results  Component Value Date   WBC 11.3 (H) 12/18/2016   HGB 16.6 12/18/2016   HCT 48.7 12/18/2016   PLT 237.0 12/18/2016   GLUCOSE 195 (H) 12/18/2016   CHOL 233 (H) 12/18/2016   TRIG (H) 12/18/2016    660.0 Triglyceride is over 400;  calculations on Lipids are invalid.   HDL 36.20 (L) 12/18/2016   LDLDIRECT 112.0 12/18/2016   LDLCALC 107 (H) 07/29/2009   ALT 13 12/18/2016   AST 7 12/18/2016   NA 136 12/18/2016   K 4.0 12/18/2016   CL 95 (L) 12/18/2016   CREATININE 0.73 12/18/2016   BUN 15 12/18/2016   CO2 31 12/18/2016   TSH 2.16 10/18/2015   HGBA1C 8.7 06/10/2017   MICROALBUR 1.9 12/18/2016    BP Readings from Last 3 Encounters:  07/22/17 108/78  06/10/17 130/84  01/04/17 122/82   Wt Readings from Last 3 Encounters:  07/22/17 300 lb (136.1 kg)  06/10/17 (!) 302 lb 9.6 oz (137.3 kg)  01/04/17 299 lb (135.6 kg)     ASSESSMENT AND PLAN:  Discussed the following assessment and plan:    Visit for preventive health examination - Plan: Basic metabolic panel, CBC with Differential/Platelet, Hepatic function panel, Lipid panel, TSH, Microalbumin / creatinine urine ratio, Ferritin, T4, free  Medication management - Plan: Basic metabolic panel, CBC with Differential/Platelet, Hepatic function panel, Lipid panel, TSH, Microalbumin / creatinine urine ratio, Ferritin, T4, free  Hyperlipidemia, unspecified hyperlipidemia type - Plan: Basic metabolic panel, CBC with Differential/Platelet, Hepatic function panel, Lipid panel, TSH, Microalbumin / creatinine urine ratio, Ferritin, T4, free  Morbid obesity, unspecified obesity type ([place]) - Plan: Basic  metabolic panel, CBC with Differential/Platelet, Hepatic function panel, Lipid panel, TSH, Microalbumin / creatinine urine ratio, Ferritin, T4, free  Type 2 diabetes mellitus not at goal [facility]) - Plan: Basic metabolic panel, CBC with Differential/Platelet, Hepatic function panel, Lipid panel, TSH, Microalbumin / creatinine urine ratio, Ferritin, T4, free  Cold intolerance of hand - Plan: Basic metabolic panel, CBC with Differential/Platelet, Hepatic function panel, Lipid panel, TSH, Microalbumin / creatinine urine ratio, Ferritin, T4, free  Need for Td vaccine - Plan: Td :  Tetanus/diphtheria >7yo Preservative  free  Erectile dysfunction, unspecified erectile dysfunction type  Patient Care Team: Burnis Medin, MD as PCP - General Clance, Armando Reichert, MD (Pulmonary Disease) Philemon Kingdom, MD as Consulting Physician (Internal Medicine) Patient Instructions  Healthy weight loss and  Exercise may help ED  Common cuases are  Decreased blood flow or vascular disease but your pulses are good in exam today  Consideration of seeing    Urology about opinion  About the ED Medications  May be helpful but   But usually not  Paid for  By insurance    Checking for anemia and  Other labs  Save the a1c .   Plan follow up depending on  Labs  And  Progress.  Will refill the  Albuterol as needed  Give a try of  3-4 weeks of no processed   carbs   And check portion sizes   .    25 - 50 more pounds would make a big difference   In  Your health     Health Maintenance, Male A healthy lifestyle and preventive care is important for your health and wellness. Ask your health care provider about what schedule of regular examinations is right for you. What should I know about weight and diet? Eat a Healthy Diet  Eat plenty of vegetables, fruits, whole grains, low-fat dairy products, and lean protein.  Do not eat a lot of foods high in solid fats, added sugars, or salt.  Maintain a Healthy Weight Regular exercise can help you achieve or maintain a healthy weight. You should:  Do at least 150 minutes of exercise each week. The exercise should increase your heart rate and make you sweat (moderate-intensity exercise).  Do strength-training exercises at least twice a week.  Watch Your Levels of Cholesterol and Blood Lipids  Have your blood tested for lipids and cholesterol every 5 years starting at 47 years of age. If you are at high risk for heart disease, you should start having your blood tested when you are 47 years old. You may need to have your cholesterol levels checked  more often if: ? Your lipid or cholesterol levels are high. ? You are older than 47 years of age. ? You are at high risk for heart disease.  What should I know about cancer screening? Many types of cancers can be detected early and may often be prevented. Lung Cancer  You should be screened every year for lung cancer if: ? You are a current smoker who has smoked for at least 30 years. ? You are a former smoker who has quit within the past 15 years.  Talk to your health care provider about your screening options, when you should start screening, and how often you should be screened.  Colorectal Cancer  Routine colorectal cancer screening usually begins at 47 years of age and should be repeated every 5-10 years until you are 47 years old. You may need to be screened more  often if early forms of precancerous polyps or small growths are found. Your health care provider may recommend screening at an earlier age if you have risk factors for colon cancer.  Your health care provider may recommend using home test kits to check for hidden blood in the stool.  A small camera at the end of a tube can be used to examine your colon (sigmoidoscopy or colonoscopy). This checks for the earliest forms of colorectal cancer.  Prostate and Testicular Cancer  Depending on your age and overall health, your health care provider may do certain tests to screen for prostate and testicular cancer.  Talk to your health care provider about any symptoms or concerns you have about testicular or prostate cancer.  Skin Cancer  Check your skin from head to toe regularly.  Tell your health care provider about any new moles or changes in moles, especially if: ? There is a change in a mole's size, shape, or color. ? You have a mole that is larger than a pencil eraser.  Always use sunscreen. Apply sunscreen liberally and repeat throughout the day.  Protect yourself by wearing long sleeves, pants, a wide-brimmed hat,  and sunglasses when outside.  What should I know about heart disease, diabetes, and high blood pressure?  If you are 23-1 years of age, have your blood pressure checked every 3-5 years. If you are 19 years of age or older, have your blood pressure checked every year. You should have your blood pressure measured twice-once when you are at a hospital or clinic, and once when you are not at a hospital or clinic. Record the average of the two measurements. To check your blood pressure when you are not at a hospital or clinic, you can use: ? An automated blood pressure machine at a pharmacy. ? A home blood pressure monitor.  Talk to your health care provider about your target blood pressure.  If you are between 90-34 years old, ask your health care provider if you should take aspirin to prevent heart disease.  Have regular diabetes screenings by checking your fasting blood sugar level. ? If you are at a normal weight and have a low risk for diabetes, have this test once every three years after the age of 26. ? If you are overweight and have a high risk for diabetes, consider being tested at a younger age or more often.  A one-time screening for abdominal aortic aneurysm (AAA) by ultrasound is recommended for men aged 65-75 years who are current or former smokers. What should I know about preventing infection? Hepatitis B If you have a higher risk for hepatitis B, you should be screened for this virus. Talk with your health care provider to find out if you are at risk for hepatitis B infection. Hepatitis C Blood testing is recommended for:  Everyone born from [birth year] through 1965.  Anyone with known risk factors for hepatitis C.  Sexually Transmitted Diseases (STDs)  You should be screened each year for STDs including gonorrhea and chlamydia if: ? You are sexually active and are younger than 47 years of age. ? You are older than 47 years of age and your health care provider tells you that you  are at risk for this type of infection. ? Your sexual activity has changed since you were last screened and you are at an increased risk for chlamydia or gonorrhea. Ask your health care provider if you are at risk.  Talk with your health care provider  about whether you are at high risk of being infected with HIV. Your health care provider may recommend a prescription medicine to help prevent HIV infection.  What else can I do?  Schedule regular health, dental, and eye exams.  Stay current with your vaccines (immunizations).  Do not use any tobacco products, such as cigarettes, chewing tobacco, and e-cigarettes. If you need help quitting, ask your health care provider.  Limit alcohol intake to no more than 2 drinks per day. One drink equals 12 ounces of beer, 5 ounces of wine, or 1 ounces of hard liquor.  Do not use street drugs.  Do not share needles.  Ask your health care provider for help if you need support or information about quitting drugs.  Tell your health care provider if you often feel depressed.  Tell your health care provider if you have ever been abused or do not feel safe at home. This information is not intended to replace advice given to you by your health care provider. Make sure you discuss any questions you have with your health care provider. Document Released: 08/25/2007 Document Revised: 10/26/2015 Document Reviewed: 11/30/2014 Elsevier Interactive Patient Education  2018 [place]. [name] M.D.

## 2017-07-22 ENCOUNTER — Ambulatory Visit (INDEPENDENT_AMBULATORY_CARE_PROVIDER_SITE_OTHER): Payer: Managed Care, Other (non HMO) | Admitting: Internal Medicine

## 2017-07-22 ENCOUNTER — Encounter: Payer: Self-pay | Admitting: Internal Medicine

## 2017-07-22 VITALS — BP 108/78 | HR 92 | Temp 98.2°F | Ht 70.5 in | Wt 300.0 lb

## 2017-07-22 DIAGNOSIS — N529 Male erectile dysfunction, unspecified: Secondary | ICD-10-CM

## 2017-07-22 DIAGNOSIS — Z23 Encounter for immunization: Secondary | ICD-10-CM | POA: Diagnosis not present

## 2017-07-22 DIAGNOSIS — E785 Hyperlipidemia, unspecified: Secondary | ICD-10-CM | POA: Diagnosis not present

## 2017-07-22 DIAGNOSIS — Z79899 Other long term (current) drug therapy: Secondary | ICD-10-CM

## 2017-07-22 DIAGNOSIS — E119 Type 2 diabetes mellitus without complications: Secondary | ICD-10-CM | POA: Diagnosis not present

## 2017-07-22 DIAGNOSIS — R6889 Other general symptoms and signs: Secondary | ICD-10-CM

## 2017-07-22 DIAGNOSIS — Z0000 Encounter for general adult medical examination without abnormal findings: Secondary | ICD-10-CM | POA: Diagnosis not present

## 2017-07-22 LAB — HEPATIC FUNCTION PANEL
ALBUMIN: 4.5 g/dL (ref 3.5–5.2)
ALK PHOS: 46 U/L (ref 39–117)
ALT: 17 U/L (ref 0–53)
AST: 11 U/L (ref 0–37)
BILIRUBIN DIRECT: 0.1 mg/dL (ref 0.0–0.3)
Total Bilirubin: 0.6 mg/dL (ref 0.2–1.2)
Total Protein: 7.5 g/dL (ref 6.0–8.3)

## 2017-07-22 LAB — MICROALBUMIN / CREATININE URINE RATIO
Creatinine,U: 86.2 mg/dL
MICROALB UR: 1.2 mg/dL (ref 0.0–1.9)
Microalb Creat Ratio: 1.4 mg/g (ref 0.0–30.0)

## 2017-07-22 LAB — LIPID PANEL
Cholesterol: 196 mg/dL (ref 0–200)
HDL: 37.2 mg/dL — AB (ref >39.00)
NonHDL: 158.73
Total CHOL/HDL Ratio: 5
Triglycerides: 278 mg/dL — ABNORMAL HIGH (ref 0.0–149.0)
VLDL: 55.6 mg/dL — AB (ref 0.0–40.0)

## 2017-07-22 LAB — CBC WITH DIFFERENTIAL/PLATELET
BASOS ABS: 0.1 10*3/uL (ref 0.0–0.1)
Basophils Relative: 0.7 % (ref 0.0–3.0)
EOS ABS: 0.1 10*3/uL (ref 0.0–0.7)
Eosinophils Relative: 1 % (ref 0.0–5.0)
HCT: 49.1 % (ref 39.0–52.0)
Hemoglobin: 17.2 g/dL — ABNORMAL HIGH (ref 13.0–17.0)
LYMPHS ABS: 3.5 10*3/uL (ref 0.7–4.0)
Lymphocytes Relative: 39.6 % (ref 12.0–46.0)
MCHC: 35 g/dL (ref 30.0–36.0)
MCV: 93.8 fl (ref 78.0–100.0)
MONO ABS: 0.5 10*3/uL (ref 0.1–1.0)
MONOS PCT: 5.1 % (ref 3.0–12.0)
NEUTROS ABS: 4.8 10*3/uL (ref 1.4–7.7)
NEUTROS PCT: 53.6 % (ref 43.0–77.0)
PLATELETS: 260 10*3/uL (ref 150.0–400.0)
RBC: 5.23 Mil/uL (ref 4.22–5.81)
RDW: 12.5 % (ref 11.5–15.5)
WBC: 8.9 10*3/uL (ref 4.0–10.5)

## 2017-07-22 LAB — BASIC METABOLIC PANEL
BUN: 14 mg/dL (ref 6–23)
CALCIUM: 9.5 mg/dL (ref 8.4–10.5)
CO2: 27 meq/L (ref 19–32)
CREATININE: 0.71 mg/dL (ref 0.40–1.50)
Chloride: 100 mEq/L (ref 96–112)
GFR: 126.42 mL/min (ref >60.00)
GLUCOSE: 143 mg/dL — AB (ref 70–99)
Potassium: 4.4 mEq/L (ref 3.5–5.1)
SODIUM: 137 meq/L (ref 135–145)

## 2017-07-22 LAB — TSH: TSH: 1.08 u[IU]/mL (ref 0.35–4.50)

## 2017-07-22 LAB — FERRITIN: FERRITIN: 141.3 ng/mL (ref 22.0–322.0)

## 2017-07-22 LAB — LDL CHOLESTEROL, DIRECT: LDL DIRECT: 127 mg/dL

## 2017-07-22 LAB — T4, FREE: FREE T4: 0.79 ng/dL (ref 0.60–1.60)

## 2017-07-22 MED ORDER — ATORVASTATIN CALCIUM 20 MG PO TABS: 20.0000 mg | ORAL_TABLET | Freq: Every day | ORAL | 2 refills | Status: DC

## 2017-07-22 MED ORDER — ALBUTEROL SULFATE HFA 108 (90 BASE) MCG/ACT IN AERS: 2.0000 | INHALATION_SPRAY | Freq: Four times a day (QID) | RESPIRATORY_TRACT | 2 refills | Status: DC | PRN

## 2017-07-22 NOTE — Patient Instructions (Addendum)
Healthy weight loss and  Exercise may help ED  Common cuases are  Decreased blood flow or vascular disease but your pulses are good in exam today  Consideration of seeing    Urology about opinion  About the ED Medications  May be helpful but   But usually not  Paid for  By insurance    Checking for anemia and  Other labs  Save the a1c .   Plan follow up depending on  Labs  And  Progress.  Will refill the  Albuterol as needed  Give a try of  3-4 weeks of no processed   carbs   And check portion sizes   .    25 - 50 more pounds would make a big difference   In  Your health     Health Maintenance, Male A healthy lifestyle and preventive care is important for your health and wellness. Ask your health care provider about what schedule of regular examinations is right for you. What should I know about weight and diet? Eat a Healthy Diet  Eat plenty of vegetables, fruits, whole grains, low-fat dairy products, and lean protein.  Do not eat a lot of foods high in solid fats, added sugars, or salt.  Maintain a Healthy Weight Regular exercise can help you achieve or maintain a healthy weight. You should:  Do at least 150 minutes of exercise each week. The exercise should increase your heart rate and make you sweat (moderate-intensity exercise).  Do strength-training exercises at least twice a week.  Watch Your Levels of Cholesterol and Blood Lipids  Have your blood tested for lipids and cholesterol every 5 years starting at 47 years of age. If you are at high risk for heart disease, you should start having your blood tested when you are 47 years old. You may need to have your cholesterol levels checked more often if: ? Your lipid or cholesterol levels are high. ? You are older than 47 years of age. ? You are at high risk for heart disease.  What should I know about cancer screening? Many types of cancers can be detected early and may often be prevented. Lung Cancer  You should be  screened every year for lung cancer if: ? You are a current smoker who has smoked for at least 30 years. ? You are a former smoker who has quit within the past 15 years.  Talk to your health care provider about your screening options, when you should start screening, and how often you should be screened.  Colorectal Cancer  Routine colorectal cancer screening usually begins at 47 years of age and should be repeated every 5-10 years until you are 47 years old. You may need to be screened more often if early forms of precancerous polyps or small growths are found. Your health care provider may recommend screening at an earlier age if you have risk factors for colon cancer.  Your health care provider may recommend using home test kits to check for hidden blood in the stool.  A small camera at the end of a tube can be used to examine your colon (sigmoidoscopy or colonoscopy). This checks for the earliest forms of colorectal cancer.  Prostate and Testicular Cancer  Depending on your age and overall health, your health care provider may do certain tests to screen for prostate and testicular cancer.  Talk to your health care provider about any symptoms or concerns you have about testicular or prostate cancer.  Skin  Cancer  Check your skin from head to toe regularly.  Tell your health care provider about any new moles or changes in moles, especially if: ? There is a change in a mole's size, shape, or color. ? You have a mole that is larger than a pencil eraser.  Always use sunscreen. Apply sunscreen liberally and repeat throughout the day.  Protect yourself by wearing long sleeves, pants, a wide-brimmed hat, and sunglasses when outside.  What should I know about heart disease, diabetes, and high blood pressure?  If you are 28-41 years of age, have your blood pressure checked every 3-5 years. If you are 23 years of age or older, have your blood pressure checked every year. You should have  your blood pressure measured twice-once when you are at a hospital or clinic, and once when you are not at a hospital or clinic. Record the average of the two measurements. To check your blood pressure when you are not at a hospital or clinic, you can use: ? An automated blood pressure machine at a pharmacy. ? A home blood pressure monitor.  Talk to your health care provider about your target blood pressure.  If you are between 29-17 years old, ask your health care provider if you should take aspirin to prevent heart disease.  Have regular diabetes screenings by checking your fasting blood sugar level. ? If you are at a normal weight and have a low risk for diabetes, have this test once every three years after the age of 30. ? If you are overweight and have a high risk for diabetes, consider being tested at a younger age or more often.  A one-time screening for abdominal aortic aneurysm (AAA) by ultrasound is recommended for men aged 56-75 years who are current or former smokers. What should I know about preventing infection? Hepatitis B If you have a higher risk for hepatitis B, you should be screened for this virus. Talk with your health care provider to find out if you are at risk for hepatitis B infection. Hepatitis C Blood testing is recommended for:  Everyone born from [birth year] through 1965.  Anyone with known risk factors for hepatitis C.  Sexually Transmitted Diseases (STDs)  You should be screened each year for STDs including gonorrhea and chlamydia if: ? You are sexually active and are younger than 47 years of age. ? You are older than 47 years of age and your health care provider tells you that you are at risk for this type of infection. ? Your sexual activity has changed since you were last screened and you are at an increased risk for chlamydia or gonorrhea. Ask your health care provider if you are at risk.  Talk with your health care provider about whether you are at high risk  of being infected with HIV. Your health care provider may recommend a prescription medicine to help prevent HIV infection.  What else can I do?  Schedule regular health, dental, and eye exams.  Stay current with your vaccines (immunizations).  Do not use any tobacco products, such as cigarettes, chewing tobacco, and e-cigarettes. If you need help quitting, ask your health care provider.  Limit alcohol intake to no more than 2 drinks per day. One drink equals 12 ounces of beer, 5 ounces of wine, or 1 ounces of hard liquor.  Do not use street drugs.  Do not share needles.  Ask your health care provider for help if you need support or information about quitting drugs.  Tell your health care provider if you often feel depressed.  Tell your health care provider if you have ever been abused or do not feel safe at home. This information is not intended to replace advice given to you by your health care provider. Make sure you discuss any questions you have with your health care provider. Document Released: 08/25/2007 Document Revised: 10/26/2015 Document Reviewed: 11/30/2014 Elsevier Interactive Patient Education  [employer].

## 2017-07-25 ENCOUNTER — Other Ambulatory Visit: Payer: Self-pay | Admitting: Internal Medicine

## 2017-07-25 DIAGNOSIS — E7800 Pure hypercholesterolemia, unspecified: Secondary | ICD-10-CM

## 2017-07-25 MED ORDER — ATORVASTATIN CALCIUM 40 MG PO TABS: 40.0000 mg | ORAL_TABLET | Freq: Every day | ORAL | 1 refills | Status: DC

## 2017-08-19 ENCOUNTER — Encounter: Payer: Self-pay | Admitting: Internal Medicine

## 2017-08-19 ENCOUNTER — Ambulatory Visit: Payer: Managed Care, Other (non HMO) | Admitting: Internal Medicine

## 2017-08-19 VITALS — BP 108/78 | HR 94 | Temp 97.9°F | Resp 14 | Wt 300.6 lb

## 2017-08-19 DIAGNOSIS — R202 Paresthesia of skin: Secondary | ICD-10-CM | POA: Diagnosis not present

## 2017-08-19 DIAGNOSIS — M79671 Pain in right foot: Secondary | ICD-10-CM

## 2017-08-19 DIAGNOSIS — D582 Other hemoglobinopathies: Secondary | ICD-10-CM | POA: Diagnosis not present

## 2017-08-19 DIAGNOSIS — Z794 Long term (current) use of insulin: Secondary | ICD-10-CM

## 2017-08-19 DIAGNOSIS — E1165 Type 2 diabetes mellitus with hyperglycemia: Secondary | ICD-10-CM

## 2017-08-19 LAB — CBC WITH DIFFERENTIAL/PLATELET
BASOS ABS: 0 10*3/uL (ref 0.0–0.1)
Basophils Relative: 0.4 % (ref 0.0–3.0)
EOS PCT: 0.7 % (ref 0.0–5.0)
Eosinophils Absolute: 0.1 10*3/uL (ref 0.0–0.7)
HCT: 45.6 % (ref 39.0–52.0)
Hemoglobin: 15.8 g/dL (ref 13.0–17.0)
LYMPHS ABS: 3.1 10*3/uL (ref 0.7–4.0)
Lymphocytes Relative: 25.5 % (ref 12.0–46.0)
MCHC: 34.7 g/dL (ref 30.0–36.0)
MCV: 94.6 fl (ref 78.0–100.0)
MONO ABS: 0.7 10*3/uL (ref 0.1–1.0)
MONOS PCT: 5.7 % (ref 3.0–12.0)
NEUTROS ABS: 8.3 10*3/uL — AB (ref 1.4–7.7)
NEUTROS PCT: 67.7 % (ref 43.0–77.0)
PLATELETS: 248 10*3/uL (ref 150.0–400.0)
RBC: 4.81 Mil/uL (ref 4.22–5.81)
RDW: 12.6 % (ref 11.5–15.5)
WBC: 12.3 10*3/uL — ABNORMAL HIGH (ref 4.0–10.5)

## 2017-08-19 LAB — VITAMIN B12: Vitamin B-12: 236 pg/mL (ref 211–911)

## 2017-08-19 NOTE — Progress Notes (Signed)
 Chief Complaint  Patient presents with  . Foot Pain    Has had numbness for a "while" and since sensation has returned is having "sensory pain" in foot. Worse with walking and touch of sheets on toes hurts. R foot only.    HPI: Troy [name] 47 y.o.   sda  Progressive  Tingling and sensitivity pain on left forefoot and great toe without trauma or redness    Used to be NUMB?  And now can get tingling ans pain  recently  Tried to see podiatrist advised by his insurance but no appt yet .  No falling  Bg better control recently   Hx of cvt x 2 left leg  And c spine  Surgery remote but no hx of radicular sx in legs?   ROS: See pertinent positives and negatives per HPI. osa  ? Under control      Past Medical History:  Diagnosis Date  . Arm weakness    right   . Asthma   . Diabetes insipidus ([name])   . Diabetes mellitus without complication ([name])   . DVT (deep venous thrombosis) ([name])   . Obesity   . OSA (obstructive sleep apnea)     Family History  Problem Relation Age of Onset  . Heart disease Mother   . Other Mother        clotting disorders  . Diabetes Mother   . Hypertension Unknown   . Allergies Unknown   . Deep vein thrombosis Unknown   . Sleep apnea Unknown   . Obesity Unknown   . Other Father        sudden death/cervical and lumbar disk disease  . Aneurysm Father        In his 50s smoked  . Hyperlipidemia Father     Social History   Socioeconomic History  . Marital status: Married    Spouse name: Not on file  . Number of children: Not on file  . Years of education: Not on file  . Highest education level: Not on file  Occupational History  . Occupation: Quality Analyst    Employer: [employer]  Social Needs  . Financial resource strain: Not on file  . Food insecurity:    Worry: Not on file    Inability: Not on file  . Transportation needs:    Medical: Not on file    Non-medical: Not on file  Tobacco Use  . Smoking status: Never Smoker  .  Smokeless tobacco: Never Used  Substance and Sexual Activity  . Alcohol use: Yes    Comment: 1 drink mo.  . Drug use: No  . Sexual activity: Yes  Lifestyle  . Physical activity:    Days per week: Not on file    Minutes per session: Not on file  . Stress: Not on file  Relationships  . Social connections:    Talks on phone: Not on file    Gets together: Not on file    Attends religious service: Not on file    Active member of club or organization: Not on file    Attends meetings of clubs or organizations: Not on file    Relationship status: Not on file  Other Topics Concern  . Not on file  Social History Narrative   Occupation:  days [employer]   Worked 11- 8 am  Now changed job and shift to 9-8 Mon through Thursday    Working  Ft 40 hours mostly     Married 10 10 10   Wife s/p bariatric surgery pt   Regular exercise- yes   Pt does have children   Father died suddenly 04/28/08   Mom passes 2017  Acute rep disease after acute renal failure   Daily caffeine use one a day   2 dogs and cat.              Outpatient Medications Prior to Visit  Medication Sig Dispense Refill  . albuterol (PROVENTIL HFA;VENTOLIN HFA) 108 (90 Base) MCG/ACT inhaler Inhale 2 puffs into the lungs every 6 (six) hours as needed. For wheezing 1 Inhaler 2  . apixaban (ELIQUIS) 5 MG TABS tablet Take 1 tablet (5 mg total) by mouth 2 (two) times daily. 180 tablet 2  . atorvastatin (LIPITOR) 40 MG tablet Take 1 tablet (40 mg total) by mouth daily. 90 tablet 1  . canagliflozin (INVOKANA) 100 MG TABS tablet Take 1 tablet (100 mg total) by mouth daily before breakfast. 90 tablet 3  . Continuous Blood Gluc Sensor (FREESTYLE LIBRE 14 DAY SENSOR) MISC 1 each by Does not apply route every 14 (fourteen) days. Change every 2 weeks 2 each 11  . glucose blood test strip Use as instructed 100 each 12  . Insulin Disposable Pump (V-GO 30) KIT Use 1x a day 90 kit 3  . insulin lispro (HUMALOG) 100 UNIT/ML injection Use up to  80 units daily - Vgo 30 system 90 mL 3  . metFORMIN (GLUCOPHAGE-XR) 500 MG 24 hr tablet TAKE 4 TABLETS DAILY WITH BREAKFAST 360 tablet 1  . naproxen (NAPROSYN) 500 MG tablet     . Semaglutide (OZEMPIC) 0.25 or 0.5 MG/DOSE SOPN Inject 0.5 mg into the skin once a week. 2 pen 5   No facility-administered medications prior to visit.      EXAM:  BP 108/78 (BP Location: Right Arm, Patient Position: Sitting, Cuff Size: Large)   Pulse 94   Temp 97.9 F (36.6 C) (Oral)   Resp 14   Wt (!) 300 lb 9.6 oz (136.4 kg)   SpO2 98%   BMI 42.52 kg/m   Body mass index is 42.52 kg/m.  GENERAL: vitals reviewed and listed above, alert, oriented, appears well hydrated and in no acute distress  MS: moves all extremities without noticeable focal  Abnormality  r foot no ulcers or edema pulses present  Sensitive changes  Dysesthesias great toe and  Distal for foot but can feel .  Some callous  5 metatarsal no redness PSYCH: pleasant and cooperative, no obvious depression or anxiety Lab Results  Component Value Date   VITAMINB12 236 08/19/2017   Lab Results  Component Value Date   WBC 12.3 (H) 08/19/2017   HGB 15.8 08/19/2017   HCT 45.6 08/19/2017   PLT 248.0 08/19/2017   GLUCOSE 143 (H) 07/22/2017   CHOL 196 07/22/2017   TRIG 278.0 (H) 07/22/2017   HDL 37.20 (L) 07/22/2017   LDLDIRECT 127.0 07/22/2017   LDLCALC 107 (H) 07/29/2009   ALT 17 07/22/2017   AST 11 07/22/2017   NA 137 07/22/2017   K 4.4 07/22/2017   CL 100 07/22/2017   CREATININE 0.71 07/22/2017   BUN 14 07/22/2017   CO2 27 07/22/2017   TSH 1.08 07/22/2017   HGBA1C 8.7 06/10/2017   MICROALBUR 1.2 07/22/2017    ASSESSMENT AND PLAN:  Discussed the following assessment and plan:  Right foot pain - Plan: Vitamin B12, CBC with Differential/Platelet  Tingling - Plan: Vitamin B12, CBC with Differential/Platelet    Type 2 diabetes mellitus with hyperglycemia, with long-term current use of insulin ([name]) - Plan: Vitamin B12, CBC  with Differential/Platelet  Elevated hemoglobin ([name]) - Plan: Vitamin B12, CBC with Differential/Platelet Plan b 12 today and repeat cbcdiff and plan podiatry assessment  First   consideration for lyrica etc for pain of neuropathy ( sounds neuropathic but  R/o other metabolic or  Compression  mechanical  Causes )  He never got to see Dr Granfortuna as the referrals never went through?)  Referral 2017 and never happened  He has fam hx of sig PE and he has had dvts  -Patient advised to return or notify health care team  if symptoms worsen ,persist or new concerns arise.  Patient Instructions  Checking b12 to r/o metabolic  Vs diabetes   See podiatrist  To check for compression other causes.   Let us know  If we need to do a different referral .     Wanda K. [name] M.D. 

## 2017-08-19 NOTE — Patient Instructions (Signed)
Checking F12 to r/o metabolic  Vs diabetes   See podiatrist  To check for compression other causes.   Let us know  If we need to do a different referral .

## 2017-08-21 ENCOUNTER — Encounter: Payer: Self-pay | Admitting: Internal Medicine

## 2017-08-28 ENCOUNTER — Telehealth: Payer: Self-pay | Admitting: Internal Medicine

## 2017-08-28 NOTE — Telephone Encounter (Signed)
Called in and was given lab results ordered by Dr. Regis Bill on 08/19/17.  Hemoglobin is normal.   WBC up some but non specific.   Don't see signs of infection. Vitamin B12 is low normal so can not say causing the sx. But low enough to advise additional B12 intake.  Begin 1000 mcg vitamin B12 every day.  I let him know he could buy this OTC.   He verbalized understanding.  Proceed with the plan to see podiatry.  He has seen podiatry and they have put in a referral for him to see a nerve doctor.   He is waiting on the nerve doctor to contact him for an appt.  No questions from pt.

## 2017-09-16 ENCOUNTER — Encounter: Payer: Self-pay | Admitting: Internal Medicine

## 2017-09-16 ENCOUNTER — Ambulatory Visit (INDEPENDENT_AMBULATORY_CARE_PROVIDER_SITE_OTHER): Payer: Managed Care, Other (non HMO) | Admitting: Internal Medicine

## 2017-09-16 VITALS — BP 118/74 | HR 89 | Ht 70.5 in | Wt 298.4 lb

## 2017-09-16 DIAGNOSIS — Z6841 Body Mass Index (BMI) 40.0 and over, adult: Secondary | ICD-10-CM

## 2017-09-16 DIAGNOSIS — E118 Type 2 diabetes mellitus with unspecified complications: Secondary | ICD-10-CM | POA: Insufficient documentation

## 2017-09-16 DIAGNOSIS — Z794 Long term (current) use of insulin: Secondary | ICD-10-CM | POA: Insufficient documentation

## 2017-09-16 DIAGNOSIS — E785 Hyperlipidemia, unspecified: Secondary | ICD-10-CM

## 2017-09-16 DIAGNOSIS — E11319 Type 2 diabetes mellitus with unspecified diabetic retinopathy without macular edema: Secondary | ICD-10-CM | POA: Diagnosis not present

## 2017-09-16 DIAGNOSIS — E113299 Type 2 diabetes mellitus with mild nonproliferative diabetic retinopathy without macular edema, unspecified eye: Secondary | ICD-10-CM | POA: Insufficient documentation

## 2017-09-16 DIAGNOSIS — E119 Type 2 diabetes mellitus without complications: Secondary | ICD-10-CM | POA: Insufficient documentation

## 2017-09-16 LAB — POCT GLYCOSYLATED HEMOGLOBIN (HGB A1C): Hemoglobin A1C: 7.3 % — AB (ref 4.0–5.6)

## 2017-09-16 MED ORDER — SEMAGLUTIDE (1 MG/DOSE) 2 MG/1.5ML ~~LOC~~ SOPN: 1.0000 mg | PEN_INJECTOR | SUBCUTANEOUS | 3 refills | Status: DC

## 2017-09-16 NOTE — Progress Notes (Signed)
Subjective:     Patient ID: Troy [name], male   DOB: Nov 30, 1970, 47 y.o.   MRN: [id number]  HPI Troy [name] is a pleasant 47 y.o. man, returning for management of DM2, dx 2012, insulin-dependent, with complications (DR), uncontrolled. Last visit 3 months ago.  We started Ozempic at last visit and sugars improved significantly, even in the 70s and 80s, however, 2 weeks ago, he had an upper respiratory infection and was started on antibiotics, Prednisone x 1 week >> sugars 200s-300s.  They started to improve in the last week.  His latest hemoglobin A1c levels were:  Lab Results  Component Value Date   HGBA1C 8.7 06/10/2017   HGBA1C 8.3 01/04/2017   HGBA1C 7.6 10/05/2016   He is on: - Metformin XR 2000 mg in am - his dinnertime (works nights)-started 07/2015 - Invokana 100 mg before b'fast -started 10/2015 - VGo 40 >> 30: 2-3 clicks per meal >> 5-6 clicks per meal - Ozempic 0.5 mg weekly-started 06/2017 He was on Victoza before >> no SEs.   He checks his sugars many times with his freestyle libre CGM -could not interpret trends because they contain the week when he was on prednisone.  However, since then: - b'fast: 150s >> 118-210 >> 150-220 >> 134-190 - 2h after b'fast:  203-298 >> 169-214 - before lunch:  206 >> 150s >> 165-174 >> 100-180, 210 - 2h after lunch: 215-259 >> 100-150, 200 - predinner: 160-346  >> n/c >> 161-185 >> 117-177 - bedtime: 208-437 >> 150s >> 183-270 >> 133-244 Lowest: 113 >> 77 Highest: 300s  -No CKD: Lab Results  Component Value Date   BUN 14 07/22/2017   BUN 15 12/18/2016   CREATININE 0.71 07/22/2017   CREATININE 0.73 12/18/2016   No microalbuminuria: Lab Results  Component Value Date   MICRALBCREAT 1.4 07/22/2017   MICRALBCREAT 2.2 12/18/2016   MICRALBCREAT 5.8 10/18/2015   MICRALBCREAT 2.0 03/16/2013   MICRALBCREAT 1.6 11/06/2012   MICRALBCREAT 2.5 07/31/2012   MICRALBCREAT 3.6 11/26/2011   MICRALBCREAT 2.3 11/28/2010   MICRALBCREAT  0.6 07/29/2009   MICRALBCREAT 9.9 07/16/2008  Not on ACE inhibitor or ARB. -+ HL: Lab Results  Component Value Date   CHOL 196 07/22/2017   HDL 37.20 (L) 07/22/2017   LDLCALC 107 (H) 07/29/2009   LDLDIRECT 127.0 07/22/2017   TRIG 278.0 (H) 07/22/2017   CHOLHDL 5 07/22/2017  On Lipitor 20. - Last eye exam was in 08/29/2017: + DR - No numbness and tingling in feet. +sees  podiatry.  + OSA >> compliant with his CPAP   He had a DVT in R leg in 04/2015.  On Eliquis. He lost 45 lbs before 2017 - mostly vegan food.   He lost his mother 2 years ago.  She was also my patient.  Review of Systems Constitutional: no weight gain/no weight loss, no fatigue, no subjective hyperthermia, no subjective hypothermia Eyes: no blurry vision, no xerophthalmia ENT: no sore throat, no nodules palpated in throat, no dysphagia, no odynophagia, no hoarseness Cardiovascular: no CP/no SOB/no palpitations/no leg swelling Respiratory: no cough/no SOB/no wheezing Gastrointestinal: no N/no V/no D/no C/no acid reflux Musculoskeletal: no muscle aches/no joint aches Skin: no rashes, no hair loss Neurological: no tremors/no numbness/no tingling/no dizziness  I reviewed pt's medications, allergies, PMH, social hx, family hx, and changes were documented in the history of present illness. Otherwise, unchanged from my initial visit note.  Past Medical History:  Diagnosis Date  . Arm weakness    right   .  Asthma   . Diabetes insipidus (Troy [name])   . Diabetes mellitus without complication (Troy [name])   . DVT (deep venous thrombosis) (Troy [name])   . Obesity   . OSA (obstructive sleep apnea)    Past Surgical History:  Procedure Laterality Date  . BACK SURGERY    . CERVICAL DISC SURGERY     C6/C7 2009  . cubital tunnel left arm     2003  . ELBOW SURGERY    . FIBULA FRACTURE SURGERY     plate & pin removed due to infection 1997  . ULNAR NERVE REPAIR     Social History   Socioeconomic History  . Marital status: Married     Spouse name: Not on file  . Number of children: Not on file  . Years of education: Not on file  . Highest education level: Not on file  Occupational History  . Occupation: Hydrographic surveyor: [employer]  Social Needs  . Financial resource strain: Not on file  . Food insecurity:    Worry: Not on file    Inability: Not on file  . Transportation needs:    Medical: Not on file    Non-medical: Not on file  Tobacco Use  . Smoking status: Never Smoker  . Smokeless tobacco: Never Used  Substance and Sexual Activity  . Alcohol use: Yes    Comment: 1 drink mo.  . Drug use: No  . Sexual activity: Yes  Lifestyle  . Physical activity:    Days per week: Not on file    Minutes per session: Not on file  . Stress: Not on file  Relationships  . Social connections:    Talks on phone: Not on file    Gets together: Not on file    Attends religious service: Not on file    Active member of club or organization: Not on file    Attends meetings of clubs or organizations: Not on file    Relationship status: Not on file  . Intimate partner violence:    Fear of current or ex partner: Not on file    Emotionally abused: Not on file    Physically abused: Not on file    Forced sexual activity: Not on file  Other Topics Concern  . Not on file  Social History Narrative   Occupation:  days [name] 11- 8 am  Now changed job and shift to [employer] through Thursday    Working  Ft 40 hours mostly   Married '10 10 10   ' Wife s/p bariatric surgery pt   Regular exercise- yes   Pt does have children   Father died suddenly 2008/05/20   Mom passes 2017  Acute rep disease after acute renal failure   Daily caffeine use one a day   2 dogs and cat.             Current Outpatient Medications on File Prior to Visit  Medication Sig Dispense Refill  . albuterol (PROVENTIL HFA;VENTOLIN HFA) 108 (90 Base) MCG/ACT inhaler Inhale 2 puffs into the lungs every 6 (six) hours as needed. For  wheezing 1 Inhaler 2  . apixaban (ELIQUIS) 5 MG TABS tablet Take 1 tablet (5 mg total) by mouth 2 (two) times daily. 180 tablet 2  . atorvastatin (LIPITOR) 40 MG tablet Take 1 tablet (40 mg total) by mouth daily. 90 tablet 1  . canagliflozin (INVOKANA) 100 MG TABS tablet Take 1 tablet (100 mg  total) by mouth daily before breakfast. 90 tablet 3  . Continuous Blood Gluc Sensor (FREESTYLE LIBRE 14 DAY SENSOR) MISC 1 each by Does not apply route every 14 (fourteen) days. Change every 2 weeks 2 each 11  . glucose blood test strip Use as instructed 100 each 12  . Insulin Disposable Pump (V-GO 30) KIT Use 1x a day 90 kit 3  . insulin lispro (HUMALOG) 100 UNIT/ML injection Use up to 80 units daily - Vgo 30 system 90 mL 3  . metFORMIN (GLUCOPHAGE-XR) 500 MG 24 hr tablet TAKE 4 TABLETS DAILY WITH BREAKFAST 360 tablet 1  . naproxen (NAPROSYN) 500 MG tablet      No current facility-administered medications on file prior to visit.    No Known Allergies Family History  Problem Relation Age of Onset  . Heart disease Mother   . Other Mother        clotting disorders  . Diabetes Mother   . Hypertension Unknown   . Allergies Unknown   . Deep vein thrombosis Unknown   . Sleep apnea Unknown   . Obesity Unknown   . Other Father        sudden death/cervical and lumbar disk disease  . Aneurysm Father        In his 37s smoked  . Hyperlipidemia Father     Objective:   Physical Exam BP 118/74   Pulse 89   Ht 5' 10.5" (1.791 m)   Wt 298 lb 6.4 oz (135.4 kg)   SpO2 96%   BMI 42.21 kg/m  Body mass index is 42.21 kg/m. Wt Readings from Last 3 Encounters:  09/16/17 298 lb 6.4 oz (135.4 kg)  08/19/17 (!) 300 lb 9.6 oz (136.4 kg)  07/22/17 300 lb (136.1 kg)   Constitutional: overweight, in NAD Eyes: PERRLA, EOMI, no exophthalmos ENT: moist mucous membranes, no thyromegaly, no cervical lymphadenopathy Cardiovascular: RRR, No MRG, + RLE edema (post DVT) - wears a compression hose.  Respiratory: CTA  B Gastrointestinal: abdomen soft, NT, ND, BS+ Musculoskeletal: no deformities, strength intact in all 4 Skin: moist, warm, no rashes Neurological: no tremor with outstretched hands, DTR normal in all 4  Assessment:     1. DM2, insulin-dependent, uncontrolled, with complications - DR  2. Obesity class 3 BMI Classification:  < 18.5 underweight   18.5-24.9 normal weight   25.0-29.9 overweight   30.0-34.9 class I obesity   35.0-39.9 class II obesity   ? 40.0 class III obesity   3. HL  Plan:     1. Patient with history of uncontrolled diabetes, on oral medicine and also a VGo 30  patch pump, with still poor control at last visit, after which we added Ozempic.  At that [place], he already started to improve his diet, to include more fruits and vegetables and less meat and fatty foods.  I advised him that he may need to reduce his insulin doses when he started Ozempic. - He continues to check his sugars with his freestyle libre  CGM. - We downloaded his CGM tracings and his sugars were quite high while on prednisone, now started to decrease.  Sugars are still high after meals usually therefore, I suggested to the increase Ozempic to 1 mg weekly.  He had nausea and diarrhea after the first dose of Ozempic, however, this improved and n he has no GI symptoms anymore. - I advised him to: Patient Instructions  Please continue: - Metformin XR 2000 mg at dinnertime - Invokana 100  mg before b'fast  - VGo 30: 5-6 clicks per meal  Please increase Ozempic to 1 mg weekly.  Please return in 3 months with your sugar log.    - today, HbA1c is 7.3% (much better) - continue checking sugars at different times of the day - check 3x a day, rotating checks - advised for yearly eye exams >> he is UTD - Return to clinic in 3 mo with sugar log    2. Obesity class 3 - No weight loss since last visit - Increasing Ozempic should be helping with wt loss, also  3. HL - Reviewed latest lipid panel from  07/2017: LDL higher - Continues Lipitor without side effects.   Philemon Kingdom, MD PhD [facility]

## 2017-09-16 NOTE — Patient Instructions (Signed)
Please continue: - Metformin XR 2000 mg at dinnertime - Invokana 100 mg before b'fast  - VGo 30: 5-6 clicks per meal  Please increase Ozempic to 1 mg weekly.  Please return in 3 months with your sugar log.

## 2017-09-16 NOTE — Addendum Note (Signed)
Addended by: Drucilla Schmidt on: 09/16/2017 04:43 PM   Modules accepted: Orders

## 2017-10-23 ENCOUNTER — Encounter: Payer: Self-pay | Admitting: Internal Medicine

## 2017-10-23 ENCOUNTER — Ambulatory Visit: Payer: Managed Care, Other (non HMO) | Admitting: Internal Medicine

## 2017-10-23 VITALS — BP 108/78 | HR 85 | Temp 98.1°F | Wt 299.3 lb

## 2017-10-23 DIAGNOSIS — Z9889 Other specified postprocedural states: Secondary | ICD-10-CM

## 2017-10-23 DIAGNOSIS — M542 Cervicalgia: Secondary | ICD-10-CM | POA: Diagnosis not present

## 2017-10-23 MED ORDER — METHOCARBAMOL 500 MG PO TABS: 500.0000 mg | ORAL_TABLET | Freq: Four times a day (QID) | ORAL | 1 refills | Status: DC | PRN

## 2017-10-23 NOTE — Progress Notes (Signed)
Chief Complaint  Patient presents with  . Neck Pain    Sudden onset this morning around 8am. Decreased ROM and increased pain with movement, Denies tingling or numbness in arms, hands or face. Pt does not recall injury but he notes that he has been having recurrent neck pain with humid weather.. H/o neck surgery 2008 for ruptured disc    HPI: Troy [name] 47 y.o. come in for acute visit neck pain    osnet crescendo at work  Left  Paraspinal sharp down  Without  raidation  Neur sx   No injury    Used   naproxyn and heat pad   susally helps neck pain but ot this time      At work sits  And neck has to go up and down  Neck beacause of  Pecking use of tirhg hand weakness)  pad       Hx of  c spine surgery  In past and  Ct of spine in 2015  Had  Radiation neuro pain and  numnbess not today .  ROS: See pertinent positives and negatives per HPI.  Past Medical History:  Diagnosis Date  . Arm weakness    right   . Asthma   . Diabetes insipidus ([place])   . Diabetes mellitus without complication ([place])   . DVT (deep venous thrombosis) ([place])   . Obesity   . OSA (obstructive sleep apnea)     Family History  Problem Relation Age of Onset  . Heart disease Mother   . Other Mother        clotting disorders  . Diabetes Mother   . Hypertension Unknown   . Allergies Unknown   . Deep vein thrombosis Unknown   . Sleep apnea Unknown   . Obesity Unknown   . Other Father        sudden death/cervical and lumbar disk disease  . Aneurysm Father        In his 39s smoked  . Hyperlipidemia Father     Social History   Socioeconomic History  . Marital status: Married    Spouse name: Not on file  . Number of children: Not on file  . Years of education: Not on file  . Highest education level: Not on file  Occupational History  . Occupation: Hydrographic surveyor: [employer]  Social Needs  . Financial resource strain: Not on file  . Food insecurity:    Worry: Not on file   Inability: Not on file  . Transportation needs:    Medical: Not on file    Non-medical: Not on file  Tobacco Use  . Smoking status: Never Smoker  . Smokeless tobacco: Never Used  Substance and Sexual Activity  . Alcohol use: Yes    Comment: 1 drink mo.  . Drug use: No  . Sexual activity: Yes  Lifestyle  . Physical activity:    Days per week: Not on file    Minutes per session: Not on file  . Stress: Not on file  Relationships  . Social connections:    Talks on phone: Not on file    Gets together: Not on file    Attends religious service: Not on file    Active member of club or organization: Not on file    Attends meetings of clubs or organizations: Not on file    Relationship status: Not on file  Other Topics Concern  . Not on file  Social History Narrative   Occupation:  days [employer]   Worked 11- 8 am  Now changed job and shift to 9-8 Mon through Thursday    Working  Ft 40 hours mostly   Married 10 10 10   Wife s/p bariatric surgery pt   Regular exercise- yes   Pt does have children   Father died suddenly 04/28/08   Mom passes 2017  Acute rep disease after acute renal failure   Daily caffeine use one a day   2 dogs and cat.              Outpatient Medications Prior to Visit  Medication Sig Dispense Refill  . albuterol (PROVENTIL HFA;VENTOLIN HFA) 108 (90 Base) MCG/ACT inhaler Inhale 2 puffs into the lungs every 6 (six) hours as needed. For wheezing 1 Inhaler 2  . apixaban (ELIQUIS) 5 MG TABS tablet Take 1 tablet (5 mg total) by mouth 2 (two) times daily. 180 tablet 2  . atorvastatin (LIPITOR) 40 MG tablet Take 1 tablet (40 mg total) by mouth daily. 90 tablet 1  . canagliflozin (INVOKANA) 100 MG TABS tablet Take 1 tablet (100 mg total) by mouth daily before breakfast. 90 tablet 3  . Continuous Blood Gluc Sensor (FREESTYLE LIBRE 14 DAY SENSOR) MISC 1 each by Does not apply route every 14 (fourteen) days. Change every 2 weeks 2 each 11  . glucose blood test strip  Use as instructed 100 each 12  . Insulin Disposable Pump (V-GO 30) KIT Use 1x a day 90 kit 3  . insulin lispro (HUMALOG) 100 UNIT/ML injection Use up to 80 units daily - Vgo 30 system 90 mL 3  . metFORMIN (GLUCOPHAGE-XR) 500 MG 24 hr tablet TAKE 4 TABLETS DAILY WITH BREAKFAST 360 tablet 1  . naproxen (NAPROSYN) 500 MG tablet     . Semaglutide (OZEMPIC) 1 MG/DOSE SOPN Inject 1 mg into the skin once a week. 6 pen 3   No facility-administered medications prior to visit.      EXAM:  BP 108/78 (BP Location: Right Arm, Patient Position: Sitting, Cuff Size: Normal)   Pulse 85   Temp 98.1 F (36.7 C) (Oral)   Wt 299 lb 4.8 oz (135.8 kg)   BMI 42.34 kg/m   Body mass index is 42.34 kg/m.  GENERAL: vitals reviewed and listed above, alert, oriented, appears well hydrated and in no acute distress  Moves neck gingerly  ROM   dec from pain but  Better flexion to left  HEENT: atraumatic, conjunctiva  clear, no obvious abnormalities on inspection of external nose and ears   NECK: no obvious masses on inspection palpation  No mid   Bony tenderness.      Tender left  Paraspinal area but    Some foreshortened   Posture s... Grip upp ;lfet nl  dtrs nl left  PSYCH: pleasant and cooperative, no obvious depression or anxiety  BP Readings from Last 3 Encounters:  10/23/17 108/78  09/16/17 118/74  08/19/17 108/78     COMPARISON:  MRI of the cervical spine of April 30, 2008, and cervical spine series of January 17, 2007.  FINDINGS: The cervical vertebral bodies are preserved in height. The prevertebral soft tissue spaces are normal. The patient has undergone previous anterior fusion at C7-T1. The metallic hardware is intact. There is an anterior bridging osteophyte at C6-7 with a near bridging osteophyte at C5-6. There is no perched facet nor facet or spinous process fracture. The odontoid is intact. The   pulmonary apices are clear. The soft tissues of the neck  are unremarkable.  IMPRESSION: 1. There is no acute cervical spine fracture nor dislocation. 2. There postsurgical changes at C7-T1 which are stable.   Electronically Signed   By: David  Jordan   On: 12/16/2013 14:29 ASSESSMENT AND PLAN:  Discussed the following assessment and plan:  Neck pain  Hx of cervical spine surgery Acute neck pain  Fortunately  seems muscular  And not radicular   Has  Hs of u nderlying .   Disease   Work station poss aggravating and  should be optimize    Not efor work .      Off for 2-3 days   -Patient advised to return or notify health care team  if  new concerns arise.  Patient Instructions  Advise adaptation at work station  As discussed to  Decrease risjk of recurrent neck pain  .  I can write a letter For medical necessity .   Ok   To use mild muscle relaxant      For help .   And "realtive neck rest"  Out of work for  Come back on Saturday if  Improved.       Wanda K. [name] M.D. 

## 2017-10-23 NOTE — Patient Instructions (Addendum)
Advise adaptation at work station  As discussed to  Decrease risjk of recurrent neck pain  .  I can write a letter For medical necessity .   Ok   To use mild muscle relaxant      For help .   And "realtive neck rest"  Out of work for  Come back on Saturday if  Improved.

## 2017-10-28 ENCOUNTER — Telehealth: Payer: Self-pay | Admitting: Internal Medicine

## 2017-10-28 NOTE — Telephone Encounter (Signed)
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

## 2017-10-29 NOTE — Telephone Encounter (Signed)
Please advise Dr Regis Bill, thanks.  Forms are in your red folder

## 2017-10-30 ENCOUNTER — Encounter: Payer: Self-pay | Admitting: Internal Medicine

## 2017-11-07 NOTE — Telephone Encounter (Signed)
On your desk

## 2017-11-08 NOTE — Telephone Encounter (Signed)
Pt aware via voicemail that form is completed and has been faxed back to [employer] Pt aware that forms are up front to be picked up. Copies made and placed to scan.  Nothing further needed.

## 2017-11-18 ENCOUNTER — Other Ambulatory Visit: Payer: Self-pay

## 2017-11-18 MED ORDER — FREESTYLE LIBRE 14 DAY SENSOR MISC: 1.0000 | 3 refills | Status: DC

## 2017-11-23 ENCOUNTER — Other Ambulatory Visit: Payer: Self-pay | Admitting: Internal Medicine

## 2017-11-24 ENCOUNTER — Other Ambulatory Visit: Payer: Self-pay | Admitting: Internal Medicine

## 2017-12-20 ENCOUNTER — Ambulatory Visit: Payer: Managed Care, Other (non HMO) | Admitting: Family Medicine

## 2017-12-20 ENCOUNTER — Encounter: Payer: Self-pay | Admitting: Family Medicine

## 2017-12-20 VITALS — BP 130/84 | HR 97 | Temp 98.0°F | Wt 294.4 lb

## 2017-12-20 DIAGNOSIS — R197 Diarrhea, unspecified: Secondary | ICD-10-CM

## 2017-12-20 MED ORDER — DIPHENOXYLATE-ATROPINE 2.5-0.025 MG PO TABS: 2.0000 | ORAL_TABLET | Freq: Four times a day (QID) | ORAL | 0 refills | Status: DC | PRN

## 2017-12-20 NOTE — Progress Notes (Signed)
   Subjective:    Patient ID: Troy [name], male    DOB: 1971-01-29, 47 y.o.   MRN: [id number]  HPI Here to discuss 2 weeks of watery diarrhea. No cramps pr pain. No nausea or vomiting. No fever. No recent travel. No change in his medications for at least 3 months. He went to [facility] ER on 12-09-17 and had thorough testing including stool studies and Xrays. Everything came back normal. He was given Dicyclomine to try with poor results. He has a long hx of intermittent diarrhea, and in 2011 he had upper and lower endoscopy performed per Dr. Fuller Plan. This was normal except for a few polyps. He has tested negative for celiac disease. The other thing he mentions is belching up foul smelling pockets of gas.   Review of Systems  Constitutional: Negative.   Respiratory: Negative.   Cardiovascular: Negative.   Gastrointestinal: Positive for diarrhea. Negative for abdominal distention, abdominal pain, anal bleeding, blood in stool, constipation, nausea, rectal pain and vomiting.  Neurological: Negative.        Objective:   Physical Exam  Constitutional: He appears well-developed and well-nourished.  Cardiovascular: Normal rate, regular rhythm, normal heart sounds and intact distal pulses.  Pulmonary/Chest: Effort normal and breath sounds normal.  Abdominal: Soft. Bowel sounds are normal. He exhibits no distension and no mass. There is no tenderness. There is no rebound and no guarding.          Assessment & Plan:  Diarrhea, likely a form of IBS. He can try Lomotil as needed. I also suggested he try a probiotic daily like Align. Recheck prn.  Alysia Penna, MD

## 2018-01-06 ENCOUNTER — Ambulatory Visit: Payer: Managed Care, Other (non HMO) | Admitting: Internal Medicine

## 2018-01-06 ENCOUNTER — Ambulatory Visit: Payer: Self-pay | Admitting: Internal Medicine

## 2018-01-10 NOTE — Progress Notes (Signed)
Chief Complaint  Patient presents with  . Diarrhea    Pt is still having diarrhea. Still using Lomotil and Probiotic PRN.     HPI: Troy [name] 47 y.o. come in for fu  Worsening diarrhea  Saw dr fry for diarrhea  Ongoing   Not new   10 11  See note  Had lost weight with the diarrhea   Watery  vx solid and  (Began    with "sulfur  Burps 2 weeks preor )       Had tried  [probiptics for 2 weeks  And less  This time  But   Less nocturnal .  Last 2 days  mildy improved   Soft  not pure runny.   No   Recent  antibiotic     X JUne   Had ed visit   [facility]  [place] and had labs done and nl cbc? Other testing   bg not bad   In 100s   Needs new scrip of compression stock for r leg  15 20   Still owrkingon accomodatino at work for neck   djd myelopathy  ROS: See pertinent positives and negatives per HPI.  Past Medical History:  Diagnosis Date  . Arm weakness    right   . Asthma   . Diabetes insipidus ([place])   . Diabetes mellitus without complication ([place])   . DVT (deep venous thrombosis) ([place])   . Obesity   . OSA (obstructive sleep apnea)     Family History  Problem Relation Age of Onset  . Heart disease Mother   . Other Mother        clotting disorders  . Diabetes Mother   . Hypertension Unknown   . Allergies Unknown   . Deep vein thrombosis Unknown   . Sleep apnea Unknown   . Obesity Unknown   . Other Father        sudden death/cervical and lumbar disk disease  . Aneurysm Father        In his 9s smoked  . Hyperlipidemia Father     Social History   Socioeconomic History  . Marital status: Married    Spouse name: Not on file  . Number of children: Not on file  . Years of education: Not on file  . Highest education level: Not on file  Occupational History  . Occupation: Hydrographic surveyor: [employer]  Social Needs  . Financial resource strain: Not on file  . Food insecurity:    Worry: Not on file    Inability: Not on file  .  Transportation needs:    Medical: Not on file    Non-medical: Not on file  Tobacco Use  . Smoking status: Never Smoker  . Smokeless tobacco: Never Used  Substance and Sexual Activity  . Alcohol use: Yes    Comment: 1 drink mo.  . Drug use: No  . Sexual activity: Yes  Lifestyle  . Physical activity:    Days per week: Not on file    Minutes per session: Not on file  . Stress: Not on file  Relationships  . Social connections:    Talks on phone: Not on file    Gets together: Not on file    Attends religious service: Not on file    Active member of club or organization: Not on file    Attends meetings of clubs or organizations: Not on file  Relationship status: Not on file  Other Topics Concern  . Not on file  Social History Narrative   Occupation:  days [employer] 11- 8 am  Now changed job and shift to [employer] through Thursday    Working  Ft 40 hours mostly   Married _0 Wife s/p bariatric surgery pt   Regular exercise- yes   Pt does have children   Father died suddenly 05/13/08   Mom passes 05/14/15  Acute rep disease after acute renal failure   Daily caffeine use one a day   2 dogs and cat.              Outpatient Medications Prior to Visit  Medication Sig Dispense Refill  . albuterol (PROVENTIL HFA;VENTOLIN HFA) 108 (90 Base) MCG/ACT inhaler Inhale 2 puffs into the lungs every 6 (six) hours as needed. For wheezing 1 Inhaler 2  . apixaban (ELIQUIS) 5 MG TABS tablet Take 1 tablet (5 mg total) by mouth 2 (two) times daily. 180 tablet 2  . atorvastatin (LIPITOR) 40 MG tablet Take 1 tablet (40 mg total) by mouth daily. 90 tablet 1  . canagliflozin (INVOKANA) 100 MG TABS tablet Take 1 tablet (100 mg total) by mouth daily before breakfast. 90 tablet 1  . Continuous Blood Gluc Sensor (FREESTYLE LIBRE 14 DAY SENSOR) MISC 1 each by Does not apply route every 14 (fourteen) days. 6 each 3  . diphenoxylate-atropine (LOMOTIL) 2.5-0.025 MG tablet Take 2 tablets by mouth  4 (four) times daily as needed for diarrhea or loose stools. 60 tablet 0  . glucose blood test strip Use as instructed 100 each 12  . Insulin Disposable Pump (V-GO 30) KIT Use 1x a day 90 kit 3  . insulin lispro (HUMALOG) 100 UNIT/ML injection Use up to 80 units daily - Vgo 30 system 90 mL 3  . metFORMIN (GLUCOPHAGE-XR) 500 MG 24 hr tablet TAKE 4 TABLETS DAILY WITH BREAKFAST 360 tablet 4  . methocarbamol (ROBAXIN) 500 MG tablet Take 1-2 tablets (500-1,000 mg total) by mouth every 6 (six) hours as needed for muscle spasms. 30 tablet 1  . naproxen (NAPROSYN) 500 MG tablet     . Semaglutide (OZEMPIC) 1 MG/DOSE SOPN Inject 1 mg into the skin once a week. 6 pen 3   No facility-administered medications prior to visit.      EXAM:  BP 122/80 (BP Location: Right Arm, Patient Position: Sitting, Cuff Size: Normal)   Pulse 94   Temp 98 F (36.7 C) (Oral)   Wt 297 lb 6.4 oz (134.9 kg)   BMI 42.07 kg/m   Body mass index is 42.07 kg/m.  GENERAL: vitals reviewed and listed above, alert, oriented, appears well hydrated and in no acute distress HEENT: atraumatic, conjunctiva  clear, no obvious abnormalities on inspection of external nose and ears NECK: no obvious masses on inspection palpation  LUNGS: clear to auscultation bilaterally, no wheezes, rales or rhonchi, good air movement CV: HRRR, no clubbing cyanosis or  peripheral edema nl cap refill  Abdomen:  Sof,t normal bowel sounds without hepatosplenomegaly, no guarding rebound or masses no CVA tenderness MS: moves all extremities r in compression  Rue atrophy not new  PSYCH: pleasant and cooperative, no obvious depression or anxiety Lab Results  Component Value Date   WBC 12.3 (H) 08/19/2017   HGB 15.8 08/19/2017   HCT 45.6 08/19/2017   PLT 248.0 08/19/2017   GLUCOSE 143 (H) 07/22/2017   CHOL 196  07/22/2017   TRIG 278.0 (H) 07/22/2017   HDL 37.20 (L) 07/22/2017   LDLDIRECT 127.0 07/22/2017   LDLCALC 107 (H) 07/29/2009   ALT 17  07/22/2017   AST 11 07/22/2017   NA 137 07/22/2017   K 4.4 07/22/2017   CL 100 07/22/2017   CREATININE 0.71 07/22/2017   BUN 14 07/22/2017   CO2 27 07/22/2017   TSH 1.08 07/22/2017   HGBA1C 7.3 (A) 09/16/2017   MICROALBUR 1.2 07/22/2017   BP Readings from Last 3 Encounters:  01/13/18 122/80  12/20/17 130/84  10/23/17 108/78    ASSESSMENT AND PLAN:  Discussed the following assessment and plan:  Diarrhea, unspecified type - Plan: Giardia/cryptosporidium (EIA), C. difficile GDH and Toxin A/B, Stool culture, Ambulatory referral to Gastroenterology  DVT, HX OF  Edema, unspecified type  Type 2 diabetes mellitus with hyperglycemia, with long-term current use of insulin ([place]) - Plan: Ambulatory referral to Gastroenterology Baseline loose ? Predates metformin  And now  Last 5-6 weeks of acute watery  Better on probiotic  But still remains Uncertain causes   Many in diff  Is not toxic today but did have antibiotic in June  Diff dx reviewed with pt   Will get stool sample and  Refer to gi for further eval -Patient advised to return or notify health care team  if  new concerns arise.  Patient Instructions   Will do a referral to GI   And get stool tests.   As planned  .  ? If metformin adding to  Problem or other type of colitis .   Will rx  Updated compression stockings .        [name].  M.D.

## 2018-01-13 ENCOUNTER — Encounter: Payer: Self-pay | Admitting: Internal Medicine

## 2018-01-13 ENCOUNTER — Ambulatory Visit: Payer: Managed Care, Other (non HMO) | Admitting: Internal Medicine

## 2018-01-13 VITALS — BP 122/80 | HR 94 | Temp 98.0°F | Wt 297.4 lb

## 2018-01-13 DIAGNOSIS — R197 Diarrhea, unspecified: Secondary | ICD-10-CM

## 2018-01-13 DIAGNOSIS — R609 Edema, unspecified: Secondary | ICD-10-CM

## 2018-01-13 DIAGNOSIS — Z86718 Personal history of other venous thrombosis and embolism: Secondary | ICD-10-CM | POA: Diagnosis not present

## 2018-01-13 DIAGNOSIS — E1165 Type 2 diabetes mellitus with hyperglycemia: Secondary | ICD-10-CM

## 2018-01-13 DIAGNOSIS — Z794 Long term (current) use of insulin: Secondary | ICD-10-CM

## 2018-01-13 NOTE — Patient Instructions (Addendum)
  Will do a referral to GI   And get stool tests.   As planned  .  ? If metformin adding to  Problem or other type of colitis .   Will rx  Updated compression stockings .

## 2018-01-14 ENCOUNTER — Encounter: Payer: Self-pay | Admitting: Gastroenterology

## 2018-01-15 ENCOUNTER — Other Ambulatory Visit: Payer: Self-pay | Admitting: Internal Medicine

## 2018-01-15 NOTE — Telephone Encounter (Signed)
Copied from [place] [phone]. Topic: General - Other >> Jan 15, 2018 12:37 PM Lennox Solders wrote: Reason for CRM: [facility] they did fax rx for atorvastatin  on 01-01-18. Pt needs refill atorvastatin 40 mg #90 w/refills

## 2018-01-16 MED ORDER — ATORVASTATIN CALCIUM 40 MG PO TABS: 40.0000 mg | ORAL_TABLET | Freq: Every day | ORAL | 0 refills | Status: DC

## 2018-01-16 NOTE — Telephone Encounter (Signed)
Pt was supposed to have his Lipids rechecked this month Last OV 07/2017 Dr Regis Bill stated that he needed to start Atorvastatin for 6 months and then recheck lipids.  I sent in 90 day supply and advised patient that he needs a lab visit within the next 1-2 weeks. Nothing further needed.

## 2018-01-17 LAB — GIARDIA/CRYPTOSPORIDIUM (EIA)
MICRO NUMBER:: 91324115
RESULT:: NOT DETECTED
SPECIMEN QUALITY:: ADEQUATE

## 2018-01-17 LAB — TIQ-NTM

## 2018-01-17 LAB — STOOL CULTURE
MICRO NUMBER: 91324116
MICRO NUMBER:: 91324112
MICRO NUMBER:: 91324114
SHIGA RESULT:: NOT DETECTED
SPECIMEN QUALITY: ADEQUATE
SPECIMEN QUALITY:: ADEQUATE
SPECIMEN QUALITY:: ADEQUATE

## 2018-01-17 LAB — C. DIFFICILE GDH AND TOXIN A/B
GDH ANTIGEN: NOT DETECTED
MICRO NUMBER:: 91324113
SPECIMEN QUALITY: ADEQUATE
TOXIN A AND B: NOT DETECTED

## 2018-02-10 ENCOUNTER — Ambulatory Visit: Payer: Self-pay | Admitting: Internal Medicine

## 2018-02-10 DIAGNOSIS — Z0289 Encounter for other administrative examinations: Secondary | ICD-10-CM

## 2018-02-24 ENCOUNTER — Encounter: Payer: Self-pay | Admitting: Gastroenterology

## 2018-02-24 ENCOUNTER — Other Ambulatory Visit (INDEPENDENT_AMBULATORY_CARE_PROVIDER_SITE_OTHER): Payer: Managed Care, Other (non HMO)

## 2018-02-24 ENCOUNTER — Ambulatory Visit (INDEPENDENT_AMBULATORY_CARE_PROVIDER_SITE_OTHER): Payer: Managed Care, Other (non HMO) | Admitting: Gastroenterology

## 2018-02-24 VITALS — BP 124/78 | HR 70 | Ht 70.0 in | Wt 298.2 lb

## 2018-02-24 DIAGNOSIS — Z7901 Long term (current) use of anticoagulants: Secondary | ICD-10-CM

## 2018-02-24 DIAGNOSIS — R197 Diarrhea, unspecified: Secondary | ICD-10-CM

## 2018-02-24 DIAGNOSIS — Z86010 Personal history of colonic polyps: Secondary | ICD-10-CM

## 2018-02-24 DIAGNOSIS — Z860101 Personal history of adenomatous and serrated colon polyps: Secondary | ICD-10-CM

## 2018-02-24 LAB — IGA: IGA: 150 mg/dL (ref 68–378)

## 2018-02-24 NOTE — Progress Notes (Signed)
History of Present Illness: This is a 47 year old male referred by Troy [name], Troy Brooking, MD for the evaluation of chronic diarrhea.  Patient relates having problems with diarrhea since 2004.  He underwent colonoscopy in 2011 for evaluation of diarrhea.  He has 4-5 soft to semi-formed bowel movements each day for many years.  About 2 months ago his diarrhea worsened for 2 weeks with 9-10 watery loose stools per day.  Evaluation to date has included stool cultures, C diff, O& P which were all negative.  At this point he has occasional days with 7-8 looser bowel movements and most days with his typical 4-5 semi-formed bowel movements.  He states he generally has urgent bowel movements following meals and this pattern has not changed in years.  He has not had any dietary change.  He denies any recent travel or antibiotic usage.  He has been maintained on metformin for years. Denies weight loss, abdominal pain, constipation, change in stool caliber, melena, hematochezia, nausea, vomiting, dysphagia, reflux symptoms, chest pain.  Colonoscopy to [facility] 07/2009 for diarrhea, one adenomatous polyp, one hyperplastic polyp, random biopsies were normal   No Known Allergies Outpatient Medications Prior to Visit  Medication Sig Dispense Refill  . albuterol (PROVENTIL HFA;VENTOLIN HFA) 108 (90 Base) MCG/ACT inhaler Inhale 2 puffs into the lungs every 6 (six) hours as needed. For wheezing 1 Inhaler 2  . apixaban (ELIQUIS) 5 MG TABS tablet Take 1 tablet (5 mg total) by mouth 2 (two) times daily. 180 tablet 2  . atorvastatin (LIPITOR) 40 MG tablet Take 1 tablet (40 mg total) by mouth daily. **DUE FOR LIPID RECHECK 90 tablet 0  . canagliflozin (INVOKANA) 100 MG TABS tablet Take 1 tablet (100 mg total) by mouth daily before breakfast. 90 tablet 1  . Continuous Blood Gluc Sensor (FREESTYLE LIBRE 14 DAY SENSOR) MISC 1 each by Does not apply route every 14 (fourteen) days. 6 each 3  . diphenoxylate-atropine (LOMOTIL) 2.5-0.025 MG  tablet Take 2 tablets by mouth 4 (four) times daily as needed for diarrhea or loose stools. 60 tablet 0  . glucose blood test strip Use as instructed 100 each 12  . Insulin Disposable Pump (V-GO 30) KIT Use 1x a day 90 kit 3  . insulin lispro (HUMALOG) 100 UNIT/ML injection Use up to 80 units daily - Vgo 30 system 90 mL 3  . metFORMIN (GLUCOPHAGE-XR) 500 MG 24 hr tablet TAKE 4 TABLETS DAILY WITH BREAKFAST 360 tablet 4  . methocarbamol (ROBAXIN) 500 MG tablet Take 1-2 tablets (500-1,000 mg total) by mouth every 6 (six) hours as needed for muscle spasms. 30 tablet 1  . naproxen (NAPROSYN) 500 MG tablet     . Semaglutide (OZEMPIC) 1 MG/DOSE SOPN Inject 1 mg into the skin once a week. 6 pen 3   No facility-administered medications prior to visit.    Past Medical History:  Diagnosis Date  . Arm weakness    right   . Asthma   . Diabetes insipidus ([place])   . Diabetes mellitus without complication ([place])   . DVT (deep venous thrombosis) ([place])   . Obesity   . OSA (obstructive sleep apnea)    Past Surgical History:  Procedure Laterality Date  . BACK SURGERY    . CERVICAL DISC SURGERY     C6/C7 2009  . cubital tunnel left arm     2003  . ELBOW SURGERY    . FIBULA FRACTURE SURGERY     plate & pin removed due  to infection 1997  . ULNAR NERVE REPAIR     Social History   Socioeconomic History  . Marital status: Married    Spouse name: Not on file  . Number of children: 1  . Years of education: Not on file  . Highest education level: Not on file  Occupational History  . Occupation: Teacher, early years/pre  Social Needs  . Financial resource strain: Not on file  . Food insecurity:    Worry: Not on file    Inability: Not on file  . Transportation needs:    Medical: Not on file    Non-medical: Not on file  Tobacco Use  . Smoking status: Never Smoker  . Smokeless tobacco: Never Used  Substance and Sexual Activity  . Alcohol use: Yes    Comment: 1 drink mo.  . Drug use: No  . Sexual  activity: Yes  Lifestyle  . Physical activity:    Days per week: Not on file    Minutes per session: Not on file  . Stress: Not on file  Relationships  . Social connections:    Talks on phone: Not on file    Gets together: Not on file    Attends religious service: Not on file    Active member of club or organization: Not on file    Attends meetings of clubs or organizations: Not on file    Relationship status: Not on file  Other Topics Concern  . Not on file  Social History Narrative   Occupation:  days [employer] 11- 8 am  Now changed job and shift to [employer] through Thursday    Working  Ft 40 hours mostly   Married '10 10 10   ' Wife s/p bariatric surgery pt   Regular exercise- yes   Pt does have children   Father died suddenly 05-04-2008   Mom passes 2017  Acute rep disease after acute renal failure   Daily caffeine use one a day   2 dogs and cat.             Family History  Problem Relation Age of Onset  . Heart disease Mother   . Other Mother        clotting disorders  . Diabetes Mother   . Other Father        sudden death/cervical and lumbar disk disease  . Aneurysm Father        In his 82s smoked  . Hyperlipidemia Father   . Hypertension Other   . Allergies Other   . Deep vein thrombosis Other   . Sleep apnea Other   . Obesity Other        Review of Systems: Pertinent positive and negative review of systems were noted in the above HPI section. All other review of systems were otherwise negative.    Physical Exam: General: Well developed, well nourished, no acute distress Head: Normocephalic and atraumatic Eyes:  sclerae anicteric, EOMI Ears: Normal auditory acuity Mouth: No deformity or lesions Neck: Supple, no masses or thyromegaly Lungs: Clear throughout to auscultation Heart: Regular rate and rhythm; no murmurs, rubs or bruits Abdomen: Soft, non tender and non distended. No masses, hepatosplenomegaly or hernias noted. Normal Bowel  sounds Rectal: Deferred to colonoscopy Musculoskeletal: Symmetrical with no gross deformities  Skin: No lesions on visible extremities Pulses:  Normal pulses noted Extremities: No clubbing, cyanosis, edema or deformities noted Neurological: Alert oriented x 4, grossly nonfocal Cervical Nodes:  No significant cervical  adenopathy Inguinal Nodes: No significant inguinal adenopathy Psychological:  Alert and cooperative. Normal mood and affect   Assessment and Recommendations:  1.  Chronic diarrhea with recent worsening. R/O celiac disease, SIBO, infection, metformin side effect, pancreatic insufficiency, etc. GI pathogen panel, fecal elastase, tTG, IgA. If negative of 5 day trial off metformin supervised by Dr. Cruzita Lederer. If above not diagnostic then lactose free diet for 5 days. REV in 1 month.   2. Personal history of adenomatous colon polyps. He did not return for recommended colonoscopy in 2016. Schedule colonoscopy at [facility].   3. Anticoagulation for history of DVT.    cc: Burnis Medin, MD [address] [place], [place] [address]

## 2018-02-24 NOTE — Patient Instructions (Signed)
Your provider has requested that you go to the basement level for lab work before leaving today. Press "B" on the elevator. The lab is located at the first door on the left as you exit the elevator.  Thank you for choosing me and [facility].  Malcolm T. Stark, Jr., MD., FACG  

## 2018-02-25 LAB — TISSUE TRANSGLUTAMINASE, IGA: (TTG) AB, IGA: 2 U/mL

## 2018-03-03 ENCOUNTER — Other Ambulatory Visit: Payer: Self-pay

## 2018-03-03 DIAGNOSIS — R197 Diarrhea, unspecified: Secondary | ICD-10-CM

## 2018-03-03 LAB — GASTROINTESTINAL PATHOGEN PANEL PCR
C. difficile Tox A/B, PCR: UNDETERMINED — AB
CRYPTOSPORIDIUM, PCR: UNDETERMINED — AB
Campylobacter, PCR: UNDETERMINED — AB
E COLI (ETEC) LT/ST, PCR: UNDETERMINED — AB
E COLI (STEC) STX1/STX2, PCR: UNDETERMINED — AB
E coli 0157, PCR: UNDETERMINED — AB
Giardia lamblia, PCR: UNDETERMINED — AB
NOROVIRUS, PCR: UNDETERMINED — AB
Rotavirus A, PCR: UNDETERMINED — AB
Salmonella, PCR: UNDETERMINED — AB
Shigella, PCR: UNDETERMINED — AB

## 2018-03-03 LAB — PANCREATIC ELASTASE, FECAL: Pancreatic Elastase-1, Stool: 189 mcg/g — ABNORMAL LOW

## 2018-03-10 ENCOUNTER — Other Ambulatory Visit: Payer: Self-pay

## 2018-03-10 DIAGNOSIS — K8689 Other specified diseases of pancreas: Secondary | ICD-10-CM

## 2018-03-10 DIAGNOSIS — R197 Diarrhea, unspecified: Secondary | ICD-10-CM

## 2018-03-10 MED ORDER — PANCRELIPASE (LIP-PROT-AMYL) 36000-114000 UNITS PO CPEP: ORAL_CAPSULE | ORAL | 1 refills | Status: DC

## 2018-03-10 NOTE — Progress Notes (Signed)
c 

## 2018-03-17 ENCOUNTER — Other Ambulatory Visit (INDEPENDENT_AMBULATORY_CARE_PROVIDER_SITE_OTHER): Payer: BLUE CROSS/BLUE SHIELD

## 2018-03-17 DIAGNOSIS — K8689 Other specified diseases of pancreas: Secondary | ICD-10-CM | POA: Diagnosis not present

## 2018-03-17 DIAGNOSIS — R197 Diarrhea, unspecified: Secondary | ICD-10-CM

## 2018-03-17 LAB — CREATININE, SERUM: CREATININE: 0.74 mg/dL (ref 0.40–1.50)

## 2018-03-17 LAB — BUN: BUN: 13 mg/dL (ref 6–23)

## 2018-03-18 ENCOUNTER — Other Ambulatory Visit: Payer: BLUE CROSS/BLUE SHIELD

## 2018-03-18 DIAGNOSIS — R197 Diarrhea, unspecified: Secondary | ICD-10-CM

## 2018-03-20 LAB — GASTROINTESTINAL PATHOGEN PANEL PCR
C. DIFFICILE TOX A/B, PCR: NOT DETECTED
Campylobacter, PCR: NOT DETECTED
Cryptosporidium, PCR: NOT DETECTED
E COLI 0157, PCR: NOT DETECTED
E coli (ETEC) LT/ST PCR: NOT DETECTED
E coli (STEC) stx1/stx2, PCR: NOT DETECTED
Giardia lamblia, PCR: NOT DETECTED
Norovirus, PCR: NOT DETECTED
Rotavirus A, PCR: NOT DETECTED
Salmonella, PCR: NOT DETECTED
Shigella, PCR: NOT DETECTED

## 2018-03-24 ENCOUNTER — Ambulatory Visit (INDEPENDENT_AMBULATORY_CARE_PROVIDER_SITE_OTHER)
Admission: RE | Admit: 2018-03-24 | Discharge: 2018-03-24 | Disposition: A | Discharge status: Home / Self Care | Payer: BLUE CROSS/BLUE SHIELD | Source: Ambulatory Visit | Attending: Gastroenterology | Admitting: Gastroenterology

## 2018-03-24 DIAGNOSIS — R197 Diarrhea, unspecified: Secondary | ICD-10-CM

## 2018-03-24 DIAGNOSIS — K8689 Other specified diseases of pancreas: Secondary | ICD-10-CM

## 2018-03-24 DIAGNOSIS — R1110 Vomiting, unspecified: Secondary | ICD-10-CM | POA: Diagnosis not present

## 2018-03-24 MED ORDER — IOPAMIDOL (ISOVUE-300) INJECTION 61%
100.0000 mL | Freq: Once | INTRAVENOUS | Status: AC | PRN
  Administered 2018-03-24: 100 mL via INTRAVENOUS

## 2018-03-28 ENCOUNTER — Encounter: Payer: Self-pay | Admitting: Gastroenterology

## 2018-03-28 ENCOUNTER — Ambulatory Visit (INDEPENDENT_AMBULATORY_CARE_PROVIDER_SITE_OTHER): Payer: BLUE CROSS/BLUE SHIELD | Admitting: Gastroenterology

## 2018-03-28 VITALS — BP 124/70 | HR 92 | Ht 70.0 in | Wt 304.8 lb

## 2018-03-28 DIAGNOSIS — Z860101 Personal history of adenomatous and serrated colon polyps: Secondary | ICD-10-CM

## 2018-03-28 DIAGNOSIS — K8689 Other specified diseases of pancreas: Secondary | ICD-10-CM | POA: Diagnosis not present

## 2018-03-28 DIAGNOSIS — Z86010 Personal history of colonic polyps: Secondary | ICD-10-CM | POA: Diagnosis not present

## 2018-03-28 DIAGNOSIS — R197 Diarrhea, unspecified: Secondary | ICD-10-CM | POA: Diagnosis not present

## 2018-03-28 MED ORDER — PANCRELIPASE (LIP-PROT-AMYL) 36000-114000 UNITS PO CPEP: ORAL_CAPSULE | ORAL | 1 refills | Status: DC

## 2018-03-28 MED ORDER — NA SULFATE-K SULFATE-MG SULF 17.5-3.13-1.6 GM/177ML PO SOLN: 1.0000 | Freq: Once | ORAL | 0 refills | Status: AC

## 2018-03-28 NOTE — Patient Instructions (Signed)
You can take over the counter Imodium three times a day as needed for diarrhea symptoms.   You have been scheduled for a colonoscopy. Please follow written instructions given to you at your visit today.  Please pick up your prep supplies at the pharmacy within the next 1-3 days. If you use inhalers (even only as needed), please bring them with you on the day of your procedure. Your physician has requested that you go to www.startemmi.com and enter the access code given to you at your visit today. This web site gives a general overview about your procedure. However, you should still follow specific instructions given to you by our office regarding your preparation for the procedure.  Normal BMI (Body Mass Index- based on height and weight) is between 19 and 25. Your BMI today is Body mass index is 43.73 kg/m. [name] Please consider follow up  regarding your BMI with your Primary Care Provider.  Thank you for choosing me and [facility].  Pricilla Riffle. Dagoberto Ligas., MD., [facility]

## 2018-03-28 NOTE — Progress Notes (Signed)
    History of Present Illness: This is a 48 year old male with chronic diarrhea.  After stopping metformin for 1 week he had a few days where he did not have bowel movements and nurse his stools were more formed.  He felt this was only a modest improvement in symptoms so he resumed metformin. Fecal elastase was low at 189. Remainder of the evaluation to date has been negative. He did not start Creon as recommended as prescription was not available as prescribed.   Current Medications, Allergies, Past Medical History, Past Surgical History, Family History and Social History were reviewed in [employer] record.  Physical Exam: General: Well developed, well nourished, no acute distress Head: Normocephalic and atraumatic Eyes:  sclerae anicteric, EOMI Ears: Normal auditory acuity Mouth: No deformity or lesions Lungs: Clear throughout to auscultation Heart: Regular rate and rhythm; no murmurs, rubs or bruits Abdomen: Soft, non tender and non distended. No masses, hepatosplenomegaly or hernias noted. Normal Bowel sounds Rectal: Not done Musculoskeletal: Symmetrical with no gross deformities  Pulses:  Normal pulses noted Extremities: No clubbing, cyanosis, edema or deformities noted Neurological: Alert oriented x 4, grossly nonfocal Psychological:  Alert and cooperative. Normal mood and affect   Assessment and Recommendations:  1. Diarrhea, chronic. Pancreatic insufficieny is the likely cause. Fat modified diet long term. Resend Creon prescription and start as prescribed: 72,000 U before meals and 36,000 U before snacks.  Minimal symptomatic improvement off metformin and he has resumed the metformin.  Rule out microscopic colitis, IBD and other disorders.  Schedule colonoscopy. The risks (including bleeding, perforation, infection, missed lesions, medication reactions and possible hospitalization or surgery if complications occur), benefits, and alternatives to colonoscopy  with possible biopsy and possible polypectomy were discussed with the patient and they consent to proceed.   2. Personal history of adenomatous colon polyps.  Colonoscopy as above.  3. History of DVT.  Previously on Eliquis which was recently discontinued.  4. Obesity. Long term weight loss.

## 2018-04-02 ENCOUNTER — Encounter: Payer: Self-pay | Admitting: Gastroenterology

## 2018-04-14 ENCOUNTER — Encounter: Payer: Self-pay | Admitting: Gastroenterology

## 2018-04-14 ENCOUNTER — Ambulatory Visit (AMBULATORY_SURGERY_CENTER): Payer: BLUE CROSS/BLUE SHIELD | Admitting: Gastroenterology

## 2018-04-14 VITALS — BP 119/73 | HR 83 | Temp 97.7°F | Resp 18 | Ht 70.0 in | Wt 304.0 lb

## 2018-04-14 DIAGNOSIS — Z86010 Personal history of colonic polyps: Secondary | ICD-10-CM

## 2018-04-14 DIAGNOSIS — D125 Benign neoplasm of sigmoid colon: Secondary | ICD-10-CM

## 2018-04-14 DIAGNOSIS — K635 Polyp of colon: Secondary | ICD-10-CM | POA: Diagnosis not present

## 2018-04-14 DIAGNOSIS — R197 Diarrhea, unspecified: Secondary | ICD-10-CM | POA: Diagnosis not present

## 2018-04-14 MED ORDER — SODIUM CHLORIDE 0.9 % IV SOLN: 500.0000 mL | Freq: Once | INTRAVENOUS | Status: DC

## 2018-04-14 NOTE — Progress Notes (Signed)
Called to room to assist during endoscopic procedure.  Patient ID and intended procedure confirmed with present staff. Received instructions for my participation in the procedure from the performing physician.  

## 2018-04-14 NOTE — Progress Notes (Signed)
Report given to PACU, vss 

## 2018-04-14 NOTE — Patient Instructions (Signed)
  AWAIT PATHOLOGY REPORTS.  HANDOUTS ; POLYPS    YOU HAD AN ENDOSCOPIC PROCEDURE TODAY AT [facility]:   Refer to the procedure report that was given to you for any specific questions about what was found during the examination.  If the procedure report does not answer your questions, please call your gastroenterologist to clarify.  If you requested that your care partner not be given the details of your procedure findings, then the procedure report has been included in a sealed envelope for you to review at your convenience later.  YOU SHOULD EXPECT: Some feelings of bloating in the abdomen. Passage of more gas than usual.  Walking can help get rid of the air that was put into your GI tract during the procedure and reduce the bloating. If you had a lower endoscopy (such as a colonoscopy or flexible sigmoidoscopy) you may notice spotting of blood in your stool or on the toilet paper. If you underwent a bowel prep for your procedure, you may not have a normal bowel movement for a few days.  Please Note:  You might notice some irritation and congestion in your nose or some drainage.  This is from the oxygen used during your procedure.  There is no need for concern and it should clear up in a day or so.  SYMPTOMS TO REPORT IMMEDIATELY:   Following lower endoscopy (colonoscopy or flexible sigmoidoscopy):  Excessive amounts of blood in the stool  Significant tenderness or worsening of abdominal pains  Swelling of the abdomen that is new, acute  Fever of 100F or higher   For urgent or emergent issues, a gastroenterologist can be reached at any hour by calling [phone].   DIET:  We do recommend a small meal at first, but then you may proceed to your regular diet.  Drink plenty of fluids but you should avoid alcoholic beverages for 24 hours.  ACTIVITY:  You should plan to take it easy for the rest of today and you should NOT DRIVE or use heavy machinery until tomorrow  (because of the sedation medicines used during the test).    FOLLOW UP: Our staff will call the number listed on your records the next business day following your procedure to check on you and address any questions or concerns that you may have regarding the information given to you following your procedure. If we do not reach you, we will leave a message.  However, if you are feeling well and you are not experiencing any problems, there is no need to return our call.  We will assume that you have returned to your regular daily activities without incident.  If any biopsies were taken you will be contacted by phone or by letter within the next 1-3 weeks.  Please call us at [phone] if you have not heard about the biopsies in 3 weeks.    SIGNATURES/CONFIDENTIALITY: You and/or your care partner have signed paperwork which will be entered into your electronic medical record.  These signatures attest to the fact that that the information above on your After Visit Summary has been reviewed and is understood.  Full responsibility of the confidentiality of this discharge information lies with you and/or your care-partner.

## 2018-04-14 NOTE — Op Note (Signed)
[facility] Patient Name: Troy [name] Procedure Date: 04/14/2018 10:55 AM MRN: [id number] Endoscopist: Ladene Artist , MD Age: 48 Referring MD:  Date of Birth: Feb 10, 1971 Gender: Male Account #: [id number] Procedure:                Colonoscopy Indications:              Chronic diarrhea. Personal history of adenomatous                            colon polyps. Medicines:                Monitored Anesthesia Care Procedure:                Pre-Anesthesia Assessment:                           - Prior to the procedure, a History and Physical                            was performed, and patient medications and                            allergies were reviewed. The patient's tolerance of                            previous anesthesia was also reviewed. The risks                            and benefits of the procedure and the sedation                            options and risks were discussed with the patient.                            All questions were answered, and informed consent                            was obtained. Prior Anticoagulants: The patient has                            taken no previous anticoagulant or antiplatelet                            agents. ASA Grade Assessment: II - A patient with                            mild systemic disease. After reviewing the risks                            and benefits, the patient was deemed in                            satisfactory condition to undergo the procedure.  After obtaining informed consent, the colonoscope                            was passed under direct vision. Throughout the                            procedure, the patient's blood pressure, pulse, and                            oxygen saturations were monitored continuously. The                            Model CF-HQ190L [id number]) scope was introduced                            through the anus and advanced to the the  terminal                            ileum, with identification of the appendiceal                            orifice and IC valve. The terminal ileum, ileocecal                            valve, appendiceal orifice, and rectum were                            photographed. The quality of the bowel preparation                            was good. The colonoscopy was performed without                            difficulty. The patient tolerated the procedure                            well. Scope In: 11:06:32 AM Scope Out: 11:24:40 AM Scope Withdrawal Time: 0 hours 15 minutes 1 second  Total Procedure Duration: 0 hours 18 minutes 8 seconds  Findings:                 The perianal and digital rectal examinations were                            normal.                           The terminal ileum appeared normal.                           A 7 mm polyp was found in the sigmoid colon. The                            polyp was sessile. The polyp was removed with a  cold snare. Resection and retrieval were complete.                           The exam was otherwise without abnormality on                            direct and retroflexion views. Random biopsies                            obtained. Complications:            No immediate complications. Estimated blood loss:                            None. Estimated Blood Loss:     Estimated blood loss: none. Impression:               - The examined portion of the ileum was normal.                           - One 7 mm polyp in the sigmoid colon, removed with                            a cold snare. Resected and retrieved.                           - The examination was otherwise normal on direct                            and retroflexion views. Recommendation:           - Repeat colonoscopy in 5 years for surveillance.                           - Patient has a contact number available for                             emergencies. The signs and symptoms of potential                            delayed complications were discussed with the                            patient. Return to normal activities tomorrow.                            Written discharge instructions were provided to the                            patient.                           - Resume previous diet.                           - Continue present medications.                           -  Await pathology results. Ladene Artist, MD 04/14/2018 11:29:02 AM This report has been signed electronically.

## 2018-04-15 ENCOUNTER — Telehealth: Payer: Self-pay

## 2018-04-15 NOTE — Telephone Encounter (Signed)
  Follow up Call-  Call back number 04/14/2018  Post procedure Call Back phone  # [phone]  Permission to leave phone message Yes  Some recent data might be hidden     Patient questions:  Do you have a fever, pain , or abdominal swelling? No. Pain Score  0 *  Have you tolerated food without any problems? Yes.    Have you been able to return to your normal activities? Yes.    Do you have any questions about your discharge instructions: Diet   No. Medications  No. Follow up visit  No.  Do you have questions or concerns about your Care? No.  Actions: * If pain score is 4 or above: No action needed, pain <4.

## 2018-04-24 ENCOUNTER — Encounter: Payer: Self-pay | Admitting: Gastroenterology

## 2018-05-24 ENCOUNTER — Other Ambulatory Visit: Payer: Self-pay | Admitting: Internal Medicine

## 2018-05-28 DIAGNOSIS — J029 Acute pharyngitis, unspecified: Secondary | ICD-10-CM | POA: Diagnosis not present

## 2018-07-21 ENCOUNTER — Other Ambulatory Visit: Payer: Self-pay | Admitting: Internal Medicine

## 2018-07-21 NOTE — Telephone Encounter (Signed)
Last seen July 2019.  Please advise.

## 2018-07-21 NOTE — Telephone Encounter (Signed)
OK for 3 mo - no further refills

## 2018-07-28 ENCOUNTER — Encounter: Payer: BLUE CROSS/BLUE SHIELD | Admitting: Internal Medicine

## 2018-08-28 ENCOUNTER — Other Ambulatory Visit: Payer: Self-pay | Admitting: Internal Medicine

## 2018-09-25 DIAGNOSIS — Z794 Long term (current) use of insulin: Secondary | ICD-10-CM

## 2018-09-25 DIAGNOSIS — E1165 Type 2 diabetes mellitus with hyperglycemia: Secondary | ICD-10-CM

## 2018-09-25 DIAGNOSIS — R200 Anesthesia of skin: Secondary | ICD-10-CM

## 2018-09-26 NOTE — Telephone Encounter (Signed)
Please do podiatry referral for him  He has diabetes and numbness

## 2018-10-01 ENCOUNTER — Encounter: Payer: Self-pay | Admitting: Internal Medicine

## 2018-10-02 MED ORDER — OZEMPIC (1 MG/DOSE) 2 MG/1.5ML ~~LOC~~ SOPN: 1.0000 mg | PEN_INJECTOR | SUBCUTANEOUS | 3 refills | Status: DC

## 2018-10-03 ENCOUNTER — Encounter: Payer: Self-pay | Admitting: Podiatry

## 2018-10-03 ENCOUNTER — Other Ambulatory Visit: Payer: Self-pay | Admitting: Internal Medicine

## 2018-10-03 ENCOUNTER — Other Ambulatory Visit: Payer: Self-pay | Admitting: Podiatry

## 2018-10-03 ENCOUNTER — Ambulatory Visit (INDEPENDENT_AMBULATORY_CARE_PROVIDER_SITE_OTHER): Payer: BC Managed Care – PPO

## 2018-10-03 ENCOUNTER — Ambulatory Visit (INDEPENDENT_AMBULATORY_CARE_PROVIDER_SITE_OTHER): Payer: BC Managed Care – PPO | Admitting: Podiatry

## 2018-10-03 ENCOUNTER — Other Ambulatory Visit: Payer: Self-pay

## 2018-10-03 VITALS — Temp 97.6°F

## 2018-10-03 DIAGNOSIS — M779 Enthesopathy, unspecified: Secondary | ICD-10-CM

## 2018-10-03 DIAGNOSIS — M79671 Pain in right foot: Secondary | ICD-10-CM

## 2018-10-03 DIAGNOSIS — G629 Polyneuropathy, unspecified: Secondary | ICD-10-CM | POA: Diagnosis not present

## 2018-10-03 DIAGNOSIS — M79672 Pain in left foot: Secondary | ICD-10-CM | POA: Diagnosis not present

## 2018-10-03 MED ORDER — GABAPENTIN 300 MG PO CAPS: 300.0000 mg | ORAL_CAPSULE | Freq: Three times a day (TID) | ORAL | 3 refills | Status: DC

## 2018-10-03 NOTE — Progress Notes (Signed)
   Subjective:    Patient ID: Troy [name], male    DOB: [birth year], 48 y.o.   MRN: [id number]  HPI    Review of Systems  All other systems reviewed and are negative.      Objective:   Physical Exam        Assessment & Plan:

## 2018-10-08 NOTE — Progress Notes (Signed)
Subjective:   Patient ID: Troy [name], male   DOB: 48 y.o.   MRN: [id number]   HPI Patient states that he has had some burning in the bottom of both his feet and numbing feelings and states is been gradually getting more sensitive over the last year.  Has mild discomfort for more than numbing feeling has long-term history of diabetes and has lost over 100 pounds and his sugar is starting to normalize but it was out-of-control for extensive periods of time.  Patient does not smoke and would like to be more active   Review of Systems  All other systems reviewed and are negative.       Objective:  Physical Exam Vitals signs and nursing note reviewed.  Constitutional:      Appearance: He is well-developed.  Pulmonary:     Effort: Pulmonary effort is normal.  Musculoskeletal: Normal range of motion.  Skin:    General: Skin is warm.  Neurological:     Mental Status: He is alert.     Vascular status was found to be intact with patient found to have diminishment sharp dull vibratory and range of motion bilateral.  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  Patient will be seen back to recheck again in 2 months and was advised if there is any issues with the medicine to contact us  X-rays indicate that bone structure appears healthy with no indications of arthritis or other bone pathology

## 2018-11-03 DIAGNOSIS — G4733 Obstructive sleep apnea (adult) (pediatric): Secondary | ICD-10-CM | POA: Diagnosis not present

## 2018-11-28 ENCOUNTER — Ambulatory Visit: Payer: BC Managed Care – PPO | Admitting: Podiatry

## 2018-12-01 ENCOUNTER — Other Ambulatory Visit: Payer: Self-pay

## 2018-12-01 ENCOUNTER — Ambulatory Visit (INDEPENDENT_AMBULATORY_CARE_PROVIDER_SITE_OTHER): Payer: BC Managed Care – PPO | Admitting: Podiatry

## 2018-12-01 ENCOUNTER — Encounter: Payer: Self-pay | Admitting: Podiatry

## 2018-12-01 DIAGNOSIS — G629 Polyneuropathy, unspecified: Secondary | ICD-10-CM

## 2018-12-01 DIAGNOSIS — M79671 Pain in right foot: Secondary | ICD-10-CM | POA: Diagnosis not present

## 2018-12-01 DIAGNOSIS — M779 Enthesopathy, unspecified: Secondary | ICD-10-CM

## 2018-12-01 DIAGNOSIS — M79672 Pain in left foot: Secondary | ICD-10-CM | POA: Diagnosis not present

## 2018-12-01 NOTE — Progress Notes (Signed)
Subjective:   Patient ID: Troy [name], male   DOB: 48 y.o.   MRN: [id number]   HPI Patient states he seems to be improving but he still is having some pain but he seems to be better but he gets pain if he walks or is on his feet he still trying to lose weight   ROS      Objective:  Physical Exam  No neurovascular status intact with patient who is on an aggressive weight loss program and his A1c is reducing with plantar pain noted     Assessment:  Probability for moderate neuropathic pain so far controlled by gabapentin with patient also having tendinitis     Plan:  H&P reviewed condition and dispensed a power step orthotic to try to provide better plantar support and also we will continue with gabapentin and patient will follow-up with family physician

## 2018-12-25 ENCOUNTER — Other Ambulatory Visit: Payer: Self-pay

## 2018-12-26 ENCOUNTER — Ambulatory Visit (INDEPENDENT_AMBULATORY_CARE_PROVIDER_SITE_OTHER): Payer: BC Managed Care – PPO | Admitting: Internal Medicine

## 2018-12-26 ENCOUNTER — Encounter: Payer: Self-pay | Admitting: Internal Medicine

## 2018-12-26 VITALS — BP 120/82 | HR 81 | Ht 70.0 in | Wt 292.0 lb

## 2018-12-26 DIAGNOSIS — E11319 Type 2 diabetes mellitus with unspecified diabetic retinopathy without macular edema: Secondary | ICD-10-CM | POA: Diagnosis not present

## 2018-12-26 DIAGNOSIS — E785 Hyperlipidemia, unspecified: Secondary | ICD-10-CM | POA: Diagnosis not present

## 2018-12-26 DIAGNOSIS — E66813 Obesity, class 3: Secondary | ICD-10-CM

## 2018-12-26 DIAGNOSIS — Z794 Long term (current) use of insulin: Secondary | ICD-10-CM

## 2018-12-26 DIAGNOSIS — Z6841 Body Mass Index (BMI) 40.0 and over, adult: Secondary | ICD-10-CM

## 2018-12-26 LAB — LIPID PANEL
Cholesterol: 203 mg/dL — ABNORMAL HIGH (ref 0–200)
HDL: 35.8 mg/dL — ABNORMAL LOW (ref >39.00)
NonHDL: 167.59
Total CHOL/HDL Ratio: 6
Triglycerides: 264 mg/dL — ABNORMAL HIGH (ref 0.0–149.0)
VLDL: 52.8 mg/dL — ABNORMAL HIGH (ref 0.0–40.0)

## 2018-12-26 LAB — POCT GLYCOSYLATED HEMOGLOBIN (HGB A1C): Hemoglobin A1C: 6.7 % — AB (ref 4.0–5.6)

## 2018-12-26 LAB — MICROALBUMIN / CREATININE URINE RATIO
Creatinine,U: 154.2 mg/dL
Microalb Creat Ratio: 0.5 mg/g (ref 0.0–30.0)
Microalb, Ur: 0.8 mg/dL (ref 0.0–1.9)

## 2018-12-26 LAB — LDL CHOLESTEROL, DIRECT: Direct LDL: 125 mg/dL

## 2018-12-26 MED ORDER — INSULIN LISPRO 100 UNIT/ML ~~LOC~~ SOLN: SUBCUTANEOUS | 3 refills | Status: DC

## 2018-12-26 MED ORDER — CANAGLIFLOZIN 100 MG PO TABS: 100.0000 mg | ORAL_TABLET | Freq: Every day | ORAL | 3 refills | Status: DC

## 2018-12-26 MED ORDER — METFORMIN HCL ER 500 MG PO TB24: ORAL_TABLET | ORAL | 3 refills | Status: DC

## 2018-12-26 MED ORDER — V-GO 30 KIT: PACK | 3 refills | Status: DC

## 2018-12-26 NOTE — Progress Notes (Addendum)
Subjective:     Patient ID: Troy [name], male   DOB: [birth year], 48 y.o.   MRN: [id number]  HPI Troy [name] is a pleasant 48 y.o. man, returning for management of DM2, dx 2012, insulin-dependent, with complications (DR), uncontrolled. Last visit 1 year and 3 mo ago!  He now works days.  He feels that his sugars have improved after switching to days.  His latest hemoglobin A1c levels were:  Lab Results  Component Value Date   HGBA1C 7.3 (A) 09/16/2017   HGBA1C 8.7 06/10/2017   HGBA1C 8.3 01/04/2017   He is on: - Metformin XR 2000 mg in am -started 07/2015 - Invokana 100 mg before b'fast -started 10/2015 - VGo 40 >> 30: 2-3 clicks per meal >> 5-6 clicks per meal - Ozempic 0.5 mg weekly-started 06/2017 >> 1 mg weekly  He was on Victoza before >> no SEs.    He checks sugars >4x a day with his Freestyle Libre CGM - we could not download this today since he did not range and 16: - b'fast:  150-220 >> 134-190 >> 98, 106-148, 198 - 2h after b'fast:  203-298 >> 169-214 - before lunch: 165-174 >> 100-180, 210 >> n/c - 2h after lunch: 215-259 >> 100-150, 200 >> 88-127 - predinner:  161-185 >> 117-177 >> 105-124 - bedtime: 183-270 >> 133-244 >> 130-160 Lowest: 113 >> 77 >> 78 Highest: 300s >> 243.  -No CKD: Lab Results  Component Value Date   BUN 13 03/17/2018   BUN 14 07/22/2017   CREATININE 0.74 03/17/2018   CREATININE 0.71 07/22/2017   No MAU: Lab Results  Component Value Date   MICRALBCREAT 1.4 07/22/2017   MICRALBCREAT 2.2 12/18/2016   MICRALBCREAT 5.8 10/18/2015   MICRALBCREAT 2.0 03/16/2013   MICRALBCREAT 1.6 11/06/2012   MICRALBCREAT 2.5 07/31/2012   MICRALBCREAT 3.6 11/26/2011   MICRALBCREAT 2.3 11/28/2010   MICRALBCREAT 0.6 07/29/2009   MICRALBCREAT 9.9 07/16/2008  Not on ACE inhibitor or ARB. -+ HL: Lab Results  Component Value Date   CHOL 196 07/22/2017   HDL 37.20 (L) 07/22/2017   LDLCALC 107 (H) 07/29/2009   LDLDIRECT 127.0 07/22/2017   TRIG 278.0  (H) 07/22/2017   CHOLHDL 5 07/22/2017  On Lipitor 20. - Last eye exam was in 08/2017: + DR - no numbness and tingling in feet - sees podiatry. Started Gabapentin. + OSA >> compliant with his CPAP   He had a DVT in R leg in 04/2015.  On Eliquis. He lost 45 lbs before 2017 - mostly vegan food.   Review of Systems Constitutional: no weight gain/+ weight loss, no fatigue, no subjective hyperthermia, no subjective hypothermia Eyes: no blurry vision, no xerophthalmia ENT: no sore throat, no nodules palpated in neck, no dysphagia, no odynophagia, no hoarseness Cardiovascular: no CP/no SOB/no palpitations/+ leg swelling Respiratory: no cough/no SOB/no wheezing Gastrointestinal: no N/no V/no D/no C/no acid reflux Musculoskeletal: no muscle aches/no joint aches Skin: no hair loss, + chronic rash on R shin Neurological: no tremors/no numbness/no tingling/no dizziness  I reviewed pt's medications, allergies, PMH, social hx, family hx, and changes were documented in the history of present illness. Otherwise, unchanged from my initial visit note.  Past Medical History:  Diagnosis Date  . Arm weakness    right   . Asthma   . Diabetes insipidus ([place])   . Diabetes mellitus without complication ([place])   . DVT (deep venous thrombosis) ([place])   . Obesity   . OSA (obstructive sleep apnea)  Past Surgical History:  Procedure Laterality Date  . BACK SURGERY    . CERVICAL DISC SURGERY     C6/C7 2009  . cubital tunnel left arm     2003  . ELBOW SURGERY    . FIBULA FRACTURE SURGERY     plate & pin removed due to infection 1997  . ULNAR NERVE REPAIR     Social History   Socioeconomic History  . Marital status: Married    Spouse name: Not on file  . Number of children: 1  . Years of education: Not on file  . Highest education level: Not on file  Occupational History  . Occupation: Teacher, early years/pre  Social Needs  . Financial resource strain: Not on file  . Food insecurity    Worry: Not on  file    Inability: Not on file  . Transportation needs    Medical: Not on file    Non-medical: Not on file  Tobacco Use  . Smoking status: Never Smoker  . Smokeless tobacco: Never Used  Substance and Sexual Activity  . Alcohol use: Yes    Comment: 1 drink mo.  . Drug use: No  . Sexual activity: Yes  Lifestyle  . Physical activity    Days per week: Not on file    Minutes per session: Not on file  . Stress: Not on file  Relationships  . Social Herbalist on phone: Not on file    Gets together: Not on file    Attends religious service: Not on file    Active member of club or organization: Not on file    Attends meetings of clubs or organizations: Not on file    Relationship status: Not on file  . Intimate partner violence    Fear of current or ex partner: Not on file    Emotionally abused: Not on file    Physically abused: Not on file    Forced sexual activity: Not on file  Other Topics Concern  . Not on file  Social History Narrative   Occupation:  days [employer] 11- 8 am  Now changed job and shift to [employer] through Thursday    Working  Ft 40 hours mostly   Married _0 Wife s/p bariatric surgery pt   Regular exercise- yes   Pt does have children   Father died suddenly 15-May-2008   Mom passes 2017  Acute rep disease after acute renal failure   Daily caffeine use one a day   2 dogs and cat.             Current Outpatient Medications on File Prior to Visit  Medication Sig Dispense Refill  . albuterol (PROVENTIL HFA;VENTOLIN HFA) 108 (90 Base) MCG/ACT inhaler Inhale 2 puffs into the lungs every 6 (six) hours as needed. For wheezing 1 Inhaler 2  . atorvastatin (LIPITOR) 40 MG tablet Take 1 tablet (40 mg total) by mouth daily. **DUE FOR LIPID RECHECK 90 tablet 0  . Continuous Blood Gluc Sensor (FREESTYLE LIBRE 14 DAY SENSOR) MISC 1 EACH BY DOES NOT APPLY ROUTE EVERY 14 (FOURTEEN) DAYS. CHANGE EVERY 2 WEEKS 2 each 11  . gabapentin (NEURONTIN) 300  MG capsule Take 1 capsule (300 mg total) by mouth 3 (three) times daily. 90 capsule 3  . Insulin Disposable Pump (V-GO 30) KIT USE 1 TIME A DAY. Must have appointment for further refills. 90 kit 0  . insulin lispro (  HUMALOG) 100 UNIT/ML injection INJECT UP TO 80 UNITS DAILY-VGO 30 SYSTEM. Must have appointment before further refills. 90 mL 0  . INVOKANA 100 MG TABS tablet TAKE 1 TABLET DAILY BEFORE BREAKFAST 90 tablet 3  . lipase/protease/amylase (CREON) 36000 UNITS CPEP capsule Take 2 tablets by mouth with each meal and one with a snack 240 capsule 1  . metFORMIN (GLUCOPHAGE-XR) 500 MG 24 hr tablet TAKE 4 TABLETS DAILY WITH BREAKFAST 360 tablet 4  . Semaglutide, 1 MG/DOSE, (OZEMPIC, 1 MG/DOSE,) 2 MG/1.5ML SOPN Inject 1 mg into the skin once a week. 6 pen 3   No current facility-administered medications on file prior to visit.    No Known Allergies Family History  Problem Relation Age of Onset  . Heart disease Mother   . Other Mother        clotting disorders  . Diabetes Mother   . Other Father        sudden death/cervical and lumbar disk disease  . Aneurysm Father        In his 82s smoked  . Hyperlipidemia Father   . Hypertension Other   . Allergies Other   . Deep vein thrombosis Other   . Sleep apnea Other   . Obesity Other     Objective:   Physical Exam BP 120/82   Pulse 81   Ht _0  (1.778 m) Comment: measured  Wt 292 lb (132.5 kg)   SpO2 97%   BMI 41.90 kg/m  Body mass index is 43.62 kg/m. Wt Readings from Last 3 Encounters:  12/26/18 292 lb (132.5 kg)  04/14/18 (!) 304 lb (137.9 kg)  03/28/18 (!) 304 lb 12.8 oz (138.3 kg)   Constitutional: overweight, in NAD Eyes: PERRLA, EOMI, no exophthalmos ENT: moist mucous membranes, no thyromegaly, no cervical lymphadenopathy Cardiovascular: RRR, No MRG, + RLE edema (post DVT) - wears a compression hose.  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 DM2, insulin-dependent, uncontrolled, with complications - DR  2. Obesity class 3 BMI Classification:  < 18.5 underweight   18.5-24.9 normal weight   25.0-29.9 overweight   30.0-34.9 class I obesity   35.0-39.9 class II obesity   ? 40.0 class III obesity   3. HL  Plan:     1. Patient with history of uncontrolled diabetes, on oral antidiabetic regimen with metformin and SGLT 2 inhibitor and also VGo patch pump and weekly GLP-1 receptor agonist, increased at last visit.  He returns after long absence, however, during which, he did better with improved weight and blood sugars.  He is taking his medications consistently and checks his sugars frequently with his freestyle libre CGM.  Unfortunately, we could not download the reports today, but I did review them in his phone. -Sugars are out of close to goal so for now, I do not feel we absolutely need to change his regimen -I have refilled his prescriptions - I advised him to: Patient Instructions  Please continue: - Metformin XR 2000 mg at dinnertime - Invokana 100 mg before b'fast  - VGo 30: 5-6 clicks per meal - Ozempic 1 mg weekly  Please return in 4 months with your sugar log.    - we checked his HbA1c: 6.7% (better) - advised to check sugars at different times of  the day - 4x a day, rotating check times - advised for yearly eye exams >> he is not UTD - he is up-to-date with flu shot - return to clinic in 3-4 months    2. Obesity class 3 - lost 12 lbs since last OV - continue Ozempic and Invokana which should also help with wt loss -Discussed about stopping sodas, which she still drinks, but reportedly no more than once a week  3. HL - Reviewed latest lipid panel from 2019: LDL higher, above goal Lab Results  Component Value Date   CHOL 196  07/22/2017   HDL 37.20 (L) 07/22/2017   LDLCALC 107 (H) 07/29/2009   LDLDIRECT 127.0 07/22/2017   TRIG 278.0 (H) 07/22/2017   CHOLHDL 5 07/22/2017  - Continues Lipitor without side effects. - recheck lipids today  Component     Latest Ref Rng & Units 12/26/2018  Glucose     65 - 99 mg/dL 143 (H)  BUN     7 - 25 mg/dL 17  Creatinine     0.60 - 1.35 mg/dL 0.79  GFR, Est Non African American     > OR = 60 mL/min/1.13m 106  GFR, Est African American     > OR = 60 mL/min/1.76m123  BUN/Creatinine Ratio     6 - 22 (calc) NOT APPLICABLE  Sodium     13793 146 mmol/L 139  Potassium     3.5 - 5.3 mmol/L 4.8  Chloride     98 - 110 mmol/L 101  CO2     20 - 32 mmol/L 25  Calcium     8.6 - 10.3 mg/dL 9.3  Total Protein     6.1 - 8.1 g/dL 6.7  Albumin MSPROF     3.6 - 5.1 g/dL 4.3  Globulin     1.9 - 3.7 g/dL (calc) 2.4  AG Ratio     1.0 - 2.5 (calc) 1.8  Total Bilirubin     0.2 - 1.2 mg/dL 0.6  Alkaline phosphatase (APISO)     36 - 130 U/L 44  AST     10 - 40 U/L 13  ALT     9 - 46 U/L 15  Cholesterol     0 - 200 mg/dL 203 (H)  Triglycerides     0.0 - 149.0 mg/dL 264.0 (H)  HDL Cholesterol     >39.00 mg/dL 35.80 (L)  VLDL     0.0 - 40.0 mg/dL 52.8 (H)  Total CHOL/HDL Ratio      6  NonHDL      167.59  Microalb, Ur     0.0 - 1.9 mg/dL 0.8  Creatinine,U     mg/dL 154.2  MICROALB/CREAT RATIO     0.0 - 30.0 mg/g 0.5  Direct LDL     mg/dL 125.0   Triglycerides and LDL are still high.  He is working on imPhelps DodgeACR normal LFTs normal Kidney function normal Glucose slightly high  CrPhilemon KingdomMD PhD [facility]

## 2018-12-26 NOTE — Patient Instructions (Addendum)
Please continue: - Metformin XR 2000 mg at dinnertime - Invokana 100mg  before b'fast  - VGo 30: 5-6 clicks per meal - Ozempic 1 mg weekly  Please return in 4 months with your sugar log.

## 2018-12-26 NOTE — Addendum Note (Signed)
Addended by: Cardell Peach I on: 12/26/2018 11:12 AM   Modules accepted: Orders

## 2018-12-27 LAB — COMPLETE METABOLIC PANEL WITH GFR
AG Ratio: 1.8 (calc) (ref 1.0–2.5)
ALT: 15 U/L (ref 9–46)
AST: 13 U/L (ref 10–40)
Albumin: 4.3 g/dL (ref 3.6–5.1)
Alkaline phosphatase (APISO): 44 U/L (ref 36–130)
BUN: 17 mg/dL (ref 7–25)
CO2: 25 mmol/L (ref 20–32)
Calcium: 9.3 mg/dL (ref 8.6–10.3)
Chloride: 101 mmol/L (ref 98–110)
Creat: 0.79 mg/dL (ref 0.60–1.35)
GFR, Est African American: 123 mL/min/{1.73_m2} (ref >=60)
GFR, Est Non African American: 106 mL/min/{1.73_m2} (ref >=60)
Globulin: 2.4 g/dL (calc) (ref 1.9–3.7)
Glucose, Bld: 143 mg/dL — ABNORMAL HIGH (ref 65–99)
Potassium: 4.8 mmol/L (ref 3.5–5.3)
Sodium: 139 mmol/L (ref 135–146)
Total Bilirubin: 0.6 mg/dL (ref 0.2–1.2)
Total Protein: 6.7 g/dL (ref 6.1–8.1)

## 2018-12-31 ENCOUNTER — Encounter: Payer: Self-pay | Admitting: Internal Medicine

## 2018-12-31 DIAGNOSIS — Z794 Long term (current) use of insulin: Secondary | ICD-10-CM

## 2018-12-31 DIAGNOSIS — E11319 Type 2 diabetes mellitus with unspecified diabetic retinopathy without macular edema: Secondary | ICD-10-CM

## 2018-12-31 MED ORDER — HUMALOG 100 UNIT/ML ~~LOC~~ SOCT: SUBCUTANEOUS | 11 refills | Status: DC

## 2018-12-31 NOTE — Telephone Encounter (Signed)
RX sent

## 2019-01-30 ENCOUNTER — Encounter: Payer: Self-pay | Admitting: Internal Medicine

## 2019-01-30 ENCOUNTER — Telehealth (INDEPENDENT_AMBULATORY_CARE_PROVIDER_SITE_OTHER): Payer: BC Managed Care – PPO | Admitting: Internal Medicine

## 2019-01-30 ENCOUNTER — Other Ambulatory Visit: Payer: Self-pay

## 2019-01-30 DIAGNOSIS — Z86718 Personal history of other venous thrombosis and embolism: Secondary | ICD-10-CM

## 2019-01-30 DIAGNOSIS — M7989 Other specified soft tissue disorders: Secondary | ICD-10-CM

## 2019-01-30 MED ORDER — APIXABAN 5 MG PO TABS: ORAL_TABLET | ORAL | 0 refills | Status: DC

## 2019-01-30 NOTE — Addendum Note (Signed)
Addended byBurnis Medin on: 01/30/2019 04:23 PM   Modules accepted: Orders

## 2019-01-30 NOTE — Progress Notes (Signed)
Virtual Visit via Video Note  I connected with@ on 01/30/19 at  3:00 PM EST by a video enabled telemedicine application and verified that I am speaking with the correct person using two identifiers. Location patient: home Location provider:work  office Persons participating in the virtual visit: patient, provider  [place] national recommendations  regarding COVID 19 pandemic   video visit is advised over in office visit for this patient.  Patient aware  of the limitations of evaluation and management by telemedicine and  availability of in person appointments. and agreed to proceed.   HPI: Troy [name] presents for video visit  sda he has been doing pretty well with controlled diabetes and is lost weight to 279 however in the last 48 hours he has noted some swelling of his right lower extremity and is be certain to use his compression and is now elevating but it does not seem to have helped the swelling. 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  It reminds of the when he has had his DVT the last 2 times. 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  No chest pain shortness of breath or cardiovascular symptoms otherwise or fever.    ROS: See pertinent positives and negatives per HPI.  Past Medical History:  Diagnosis Date  . Arm weakness    right   . Asthma   . Diabetes insipidus ([place])   . Diabetes mellitus without complication ([place])   . DVT (deep venous thrombosis) ([place])   . Obesity   . OSA (obstructive sleep apnea)     Past Surgical History:  Procedure Laterality Date  . BACK SURGERY    . CERVICAL DISC SURGERY     C6/C7 2009  . cubital tunnel left arm     2003  . ELBOW SURGERY    . FIBULA FRACTURE SURGERY     plate & pin removed due to infection 1997  . ULNAR NERVE REPAIR      Family History  Problem Relation Age of Onset  . Heart disease Mother   .  Other Mother        clotting disorders  . Diabetes Mother   . Other Father        sudden death/cervical and lumbar disk disease  . Aneurysm Father        In his 57s smoked  . Hyperlipidemia Father   . Hypertension Other   . Allergies Other   . Deep vein thrombosis Other   . Sleep apnea Other   . Obesity Other     Social History   Tobacco Use  . Smoking status: Never Smoker  . Smokeless tobacco: Never Used  Substance Use Topics  . Alcohol use: Yes    Comment: 1 drink mo.  . Drug use: No      Current Outpatient Medications:  .  albuterol (PROVENTIL HFA;VENTOLIN HFA) 108 (90 Base) MCG/ACT inhaler, Inhale 2 puffs into the lungs every 6 (six) hours as needed. For wheezing, Disp: 1 Inhaler, Rfl: 2 .  atorvastatin (LIPITOR) 40 MG tablet, Take 1 tablet (40 mg total) by mouth daily. **DUE FOR LIPID RECHECK, Disp: 90 tablet, Rfl: 0 .  canagliflozin (INVOKANA) 100 MG TABS tablet, Take 1 tablet (100 mg total) by mouth daily before breakfast., Disp: 90 tablet, Rfl: 3 .  Continuous Blood Gluc Sensor (FREESTYLE LIBRE 14 DAY SENSOR)  MISC, 1 EACH BY DOES NOT APPLY ROUTE EVERY 14 (FOURTEEN) DAYS. CHANGE EVERY 2 WEEKS, Disp: 2 each, Rfl: 11 .  gabapentin (NEURONTIN) 300 MG capsule, Take 1 capsule (300 mg total) by mouth 3 (three) times daily., Disp: 90 capsule, Rfl: 3 .  Insulin Disposable Pump (V-GO 30) KIT, USE 1 TIME A DAY., Disp: 90 kit, Rfl: 3 .  insulin lispro (HUMALOG) 100 UNIT/ML cartridge, INJECT UP TO 80 UNITS DAILY-VGO 30 SYSTEM., Disp: 90 mL, Rfl: 11 .  lipase/protease/amylase (CREON) 36000 UNITS CPEP capsule, Take 2 tablets by mouth with each meal and one with a snack, Disp: 240 capsule, Rfl: 1 .  metFORMIN (GLUCOPHAGE-XR) 500 MG 24 hr tablet, TAKE 4 TABLETS DAILY WITH BREAKFAST, Disp: 360 tablet, Rfl: 3 .  Semaglutide, 1 MG/DOSE, (OZEMPIC, 1 MG/DOSE,) 2 MG/1.5ML SOPN, Inject 1 mg into the skin once a week., Disp: 6 pen, Rfl: 3  EXAM: BP Readings from Last 3 Encounters:  12/26/18  120/82  04/14/18 119/73  03/28/18 124/70   Wt Readings from Last 3 Encounters:  12/26/18 292 lb (132.5 kg)  04/14/18 (!) 304 lb (137.9 kg)  03/28/18 (!) 304 lb 12.8 oz (138.3 kg)     VITALS per patient if applicable: no fever reported says 279  GENERAL: alert, oriented, appears well and in no acute distress looks well   HEENT: atraumatic, conjunttiva clear, no obvious abnormalities on inspection of external nose and ears  NECK: normal movements of the head and neck  LUNGS: on inspection no signs of respiratory distress, breathing rate appears normal, no obvious gross SOB, gasping or wheezing  CV: no obvious cyanosis  MS: moves all visible extremities peripheral examination of his lower extremities much larger swelling of the right lower extremity than the left.  There is no redness obvious some varicosity.  Right thigh looks larger than the left side.  PSYCH/NEURO: pleasant and cooperative, no obvious depression or anxiety, speech and thought processing grossly intact Lab Results  Component Value Date   WBC 12.3 (H) 08/19/2017   HGB 15.8 08/19/2017   HCT 45.6 08/19/2017   PLT 248.0 08/19/2017   GLUCOSE 143 (H) 12/26/2018   CHOL 203 (H) 12/26/2018   TRIG 264.0 (H) 12/26/2018   HDL 35.80 (L) 12/26/2018   LDLDIRECT 125.0 12/26/2018   LDLCALC 107 (H) 07/29/2009   ALT 15 12/26/2018   AST 13 12/26/2018   NA 139 12/26/2018   K 4.8 12/26/2018   CL 101 12/26/2018   CREATININE 0.79 12/26/2018   BUN 17 12/26/2018   CO2 25 12/26/2018   TSH 1.08 07/22/2017   HGBA1C 6.7 (A) 12/26/2018   MICROALBUR 0.8 12/26/2018    ASSESSMENT AND PLAN:  Discussed the following assessment and plan:    ICD-10-CM   1. Right leg swelling  M79.89 VAS [name] LOWER EXTREMITY VENOUS (DVT)  2. DVT, HX OF  Z86.718 VAS [name] LOWER EXTREMITY VENOUS (DVT)   Right leg swelling history of DVT x2 in the right lower extremity no other incisors no bleeding or other underlying triggers.  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 Plan stat ultrasound right lower extremity it is Friday afternoon. If positive would be a candidate for oral anticoagulation.  Counseled.   Expectant management and discussion of plan and treatment with opportunity to ask questions and all were answered. The patient agreed with the plan and demonstrated an understanding of the instructions.   Advised to  call back or seek an in-person evaluation if worsening  or having  further concerns . Return for depending on [name] results  and plan.  Shanon Ace, MD

## 2019-01-30 NOTE — Progress Notes (Signed)
No    [name] available  Unless goes to ED   So disc with pat   Since this acts like prev dvt  And not high risk bleeding  Then we can  Send in eliquis for now  And plan fu as indicated  Pt aware of risk benefit  .

## 2019-02-02 ENCOUNTER — Telehealth: Payer: Self-pay | Admitting: *Deleted

## 2019-02-02 ENCOUNTER — Ambulatory Visit (HOSPITAL_COMMUNITY)
Admission: RE | Admit: 2019-02-02 | Discharge: 2019-02-02 | Disposition: A | Discharge status: Home / Self Care | Payer: BC Managed Care – PPO | Source: Ambulatory Visit | Attending: Cardiology | Admitting: Cardiology

## 2019-02-02 ENCOUNTER — Other Ambulatory Visit: Payer: Self-pay

## 2019-02-02 ENCOUNTER — Ambulatory Visit (INDEPENDENT_AMBULATORY_CARE_PROVIDER_SITE_OTHER): Payer: BC Managed Care – PPO | Admitting: Internal Medicine

## 2019-02-02 ENCOUNTER — Encounter: Payer: Self-pay | Admitting: Internal Medicine

## 2019-02-02 VITALS — BP 120/66 | HR 91 | Temp 97.9°F | Ht 70.0 in | Wt 288.2 lb

## 2019-02-02 DIAGNOSIS — N529 Male erectile dysfunction, unspecified: Secondary | ICD-10-CM

## 2019-02-02 DIAGNOSIS — E1165 Type 2 diabetes mellitus with hyperglycemia: Secondary | ICD-10-CM

## 2019-02-02 DIAGNOSIS — Z79899 Other long term (current) drug therapy: Secondary | ICD-10-CM | POA: Diagnosis not present

## 2019-02-02 DIAGNOSIS — Z86718 Personal history of other venous thrombosis and embolism: Secondary | ICD-10-CM

## 2019-02-02 DIAGNOSIS — Z0000 Encounter for general adult medical examination without abnormal findings: Secondary | ICD-10-CM

## 2019-02-02 DIAGNOSIS — I82401 Acute embolism and thrombosis of unspecified deep veins of right lower extremity: Secondary | ICD-10-CM

## 2019-02-02 DIAGNOSIS — M7989 Other specified soft tissue disorders: Secondary | ICD-10-CM | POA: Diagnosis not present

## 2019-02-02 DIAGNOSIS — Z7901 Long term (current) use of anticoagulants: Secondary | ICD-10-CM

## 2019-02-02 DIAGNOSIS — Z794 Long term (current) use of insulin: Secondary | ICD-10-CM

## 2019-02-02 DIAGNOSIS — E785 Hyperlipidemia, unspecified: Secondary | ICD-10-CM

## 2019-02-02 LAB — CBC WITH DIFFERENTIAL/PLATELET
Basophils Absolute: 0 10*3/uL (ref 0.0–0.1)
Basophils Relative: 0.5 % (ref 0.0–3.0)
Eosinophils Absolute: 0.1 10*3/uL (ref 0.0–0.7)
Eosinophils Relative: 0.7 % (ref 0.0–5.0)
HCT: 46.8 % (ref 39.0–52.0)
Hemoglobin: 16 g/dL (ref 13.0–17.0)
Lymphocytes Relative: 42.9 % (ref 12.0–46.0)
Lymphs Abs: 3.7 10*3/uL (ref 0.7–4.0)
MCHC: 34.2 g/dL (ref 30.0–36.0)
MCV: 94.8 fl (ref 78.0–100.0)
Monocytes Absolute: 0.5 10*3/uL (ref 0.1–1.0)
Monocytes Relative: 5.8 % (ref 3.0–12.0)
Neutro Abs: 4.4 10*3/uL (ref 1.4–7.7)
Neutrophils Relative %: 50.1 % (ref 43.0–77.0)
Platelets: 233 10*3/uL (ref 150.0–400.0)
RBC: 4.94 Mil/uL (ref 4.22–5.81)
RDW: 12.7 % (ref 11.5–15.5)
WBC: 8.7 10*3/uL (ref 4.0–10.5)

## 2019-02-02 LAB — COMPREHENSIVE METABOLIC PANEL
ALT: 16 U/L (ref 0–53)
AST: 12 U/L (ref 0–37)
Albumin: 4.2 g/dL (ref 3.5–5.2)
Alkaline Phosphatase: 50 U/L (ref 39–117)
BUN: 13 mg/dL (ref 6–23)
CO2: 27 mEq/L (ref 19–32)
Calcium: 9.6 mg/dL (ref 8.4–10.5)
Chloride: 102 mEq/L (ref 96–112)
Creatinine, Ser: 0.72 mg/dL (ref 0.40–1.50)
GFR: 116.28 mL/min (ref >60.00)
Glucose, Bld: 165 mg/dL — ABNORMAL HIGH (ref 70–99)
Potassium: 4.2 mEq/L (ref 3.5–5.1)
Sodium: 139 mEq/L (ref 135–145)
Total Bilirubin: 0.6 mg/dL (ref 0.2–1.2)
Total Protein: 7.1 g/dL (ref 6.0–8.3)

## 2019-02-02 LAB — TSH: TSH: 0.99 u[IU]/mL (ref 0.35–4.50)

## 2019-02-02 MED ORDER — ATORVASTATIN CALCIUM 40 MG PO TABS: 40.0000 mg | ORAL_TABLET | Freq: Every day | ORAL | 2 refills | Status: DC

## 2019-02-02 MED ORDER — GABAPENTIN 300 MG PO CAPS: 300.0000 mg | ORAL_CAPSULE | Freq: Three times a day (TID) | ORAL | 2 refills | Status: DC

## 2019-02-02 NOTE — Telephone Encounter (Signed)
lvm for pt to keep his appt today and to call back to give him results

## 2019-02-02 NOTE — Progress Notes (Signed)
Chief Complaint  Patient presents with  . Annual Exam    HPI: Patient  Troy [name]  48 y.o. comes in today for Preventive Health Care visit  And follow up of right leg swelling  Suspected dvt   Placed on eliquis over the weekend leg swelling no serious pain just swelling. On further questioning no fever no inciting except for working from home with legs down but puts them up at times.  DM getting under control with medsa nd weight loss Weight.  management   No chest pain shortness of breath unusual bleeding pain blood pressures been good Erectile dysfunction still problematic thought it would get better with blood pressure and blood sugar under control but not is interested in more evaluation. Asked for [name] to refill the gabapentin is helped his feet but otherwise would feel like walking on rocks.  This was given to him by the podiatrist. Needs refill on his Lipitor.  No side effects Eye exam was due today but had the Doppler his lower extremity instead.   Health Maintenance  Topic Date Due  . OPHTHALMOLOGY EXAM  12/19/2015  . FOOT EXAM  10/25/2016  . HEMOGLOBIN A1C  06/26/2019  . URINE MICROALBUMIN  12/26/2019  . COLONOSCOPY  04/15/2023  . TETANUS/TDAP  07/23/2027  . INFLUENZA VACCINE  Completed  . PNEUMOCOCCAL POLYSACCHARIDE VACCINE AGE 43-64 HIGH RISK  Completed  . HIV Screening  Completed   Health Maintenance Review LIFESTYLE:  Exercise:   Around house  Tobacco/ETS: no Alcohol:  One a month  Sugar beverages: Sleep: better  7-8  Drug use: no HH of  2 Work: from home.  40 + hours    ROS:  See hpi  Diarrhea better since loist weight   ( not taking pancreas enzymes  GEN/ HEENT: No fever, significant weight changes sweats headaches vision problems hearing changes, CV/ PULM; No chest pain shortness of breath cough, syncope,edema  change in exercise tolerance. GI /GU: No adominal pain, vomiting, change in bowel habits. No blood in the stool. No significant GU  symptoms. SKIN/HEME: ,no acute skin rashes suspicious lesions or bleeding. No lymphadenopathy, nodules, masses.  NEURO/ PSYCH:  No neurologic signs such as weakness numbness. No depression anxiety. IMM/ Allergy: No unusual infections.  Allergy .   REST of 12 system review negative except as per HPI   Past Medical History:  Diagnosis Date  . Arm weakness    right   . Asthma   . Diabetes insipidus ([place])   . Diabetes mellitus without complication ([place])   . DVT (deep venous thrombosis) ([place])   . Obesity   . OSA (obstructive sleep apnea)     Past Surgical History:  Procedure Laterality Date  . BACK SURGERY    . CERVICAL DISC SURGERY     C6/C7 2009  . cubital tunnel left arm     2003  . ELBOW SURGERY    . FIBULA FRACTURE SURGERY     plate & pin removed due to infection 1997  . ULNAR NERVE REPAIR      Family History  Problem Relation Age of Onset  . Heart disease Mother   . Other Mother        clotting disorders  . Diabetes Mother   . Other Father        sudden death/cervical and lumbar disk disease  . Aneurysm Father        In his 62s smoked  . Hyperlipidemia Father   . Hypertension  Other   . Allergies Other   . Deep vein thrombosis Other   . Sleep apnea Other   . Obesity Other     Social History   Socioeconomic History  . Marital status: Married    Spouse name: Not on file  . Number of children: 1  . Years of education: Not on file  . Highest education level: Not on file  Occupational History  . Occupation: Teacher, early years/pre  Social Needs  . Financial resource strain: Not on file  . Food insecurity    Worry: Not on file    Inability: Not on file  . Transportation needs    Medical: Not on file    Non-medical: Not on file  Tobacco Use  . Smoking status: Never Smoker  . Smokeless tobacco: Never Used  Substance and Sexual Activity  . Alcohol use: Yes    Comment: 1 drink mo.  . Drug use: No  . Sexual activity: Yes  Lifestyle  . Physical activity     Days per week: Not on file    Minutes per session: Not on file  . Stress: Not on file  Relationships  . Social Herbalist on phone: Not on file    Gets together: Not on file    Attends religious service: Not on file    Active member of club or organization: Not on file    Attends meetings of clubs or organizations: Not on file    Relationship status: Not on file  Other Topics Concern  . Not on file  Social History Narrative   Occupation:  days [employer] 11- 8 am  Now changed job and shift to [employer] through Thursday    Working  Ft 40 hours mostly   Married '10 10 10   ' Wife s/p bariatric surgery pt   Regular exercise- yes   Pt does have children   Father died suddenly May 24, 2008   Mom passes 2017  Acute rep disease after acute renal failure   Daily caffeine use one a day   2 dogs and cat.              Outpatient Medications Prior to Visit  Medication Sig Dispense Refill  . albuterol (PROVENTIL HFA;VENTOLIN HFA) 108 (90 Base) MCG/ACT inhaler Inhale 2 puffs into the lungs every 6 (six) hours as needed. For wheezing 1 Inhaler 2  . apixaban (ELIQUIS) 5 MG TABS tablet Take 2 tablets (67m) twice daily for 7 days, then 1 tablet (561m twice daily 60 tablet 0  . canagliflozin (INVOKANA) 100 MG TABS tablet Take 1 tablet (100 mg total) by mouth daily before breakfast. 90 tablet 3  . Continuous Blood Gluc Sensor (FREESTYLE LIBRE 14 DAY SENSOR) MISC 1 EACH BY DOES NOT APPLY ROUTE EVERY 14 (FOURTEEN) DAYS. CHANGE EVERY 2 WEEKS 2 each 11  . Insulin Disposable Pump (V-GO 30) KIT USE 1 TIME A DAY. 90 kit 3  . insulin lispro (HUMALOG) 100 UNIT/ML cartridge INJECT UP TO 80 UNITS DAILY-VGO 30 SYSTEM. 90 mL 11  . metFORMIN (GLUCOPHAGE-XR) 500 MG 24 hr tablet TAKE 4 TABLETS DAILY WITH BREAKFAST 360 tablet 3  . Semaglutide, 1 MG/DOSE, (OZEMPIC, 1 MG/DOSE,) 2 MG/1.5ML SOPN Inject 1 mg into the skin once a week. 6 pen 3  . atorvastatin (LIPITOR) 40 MG tablet Take 1 tablet (40 mg total)  by mouth daily. **DUE FOR LIPID RECHECK 90 tablet 0  . gabapentin (NEURONTIN) 300 MG capsule  Take 1 capsule (300 mg total) by mouth 3 (three) times daily. 90 capsule 3  . lipase/protease/amylase (CREON) 36000 UNITS CPEP capsule Take 2 tablets by mouth with each meal and one with a snack 240 capsule 1   No facility-administered medications prior to visit.      EXAM:  BP 120/66 (BP Location: Right Arm, Patient Position: Sitting, Cuff Size: Large)   Pulse 91   Temp 97.9 F (36.6 C) (Temporal)   Ht '5\' 10"'  (1.778 m)   Wt 288 lb 3.2 oz (130.7 kg)   SpO2 96%   BMI 41.35 kg/m   Body mass index is 41.35 kg/m. Wt Readings from Last 3 Encounters:  02/02/19 288 lb 3.2 oz (130.7 kg)  12/26/18 292 lb (132.5 kg)  04/14/18 (!) 304 lb (137.9 kg)    Physical Exam: Vital signs reviewed EXH:BZJI is a well-developed well-nourished alert cooperative    who appearsr stated age in no acute distress.  HEENT: normocephalic atraumatic , Eyes: PERRL EOM's full, conjunctiva clear, Nares: paten,t no deformity discharge or tenderness., Ears: no deformity EAC's clear TMs with normal landmarks. Mouth masked  NECK: supple without masses, thyromegaly or bruits. CHEST/PULM:  Clear to auscultation and percussion breath sounds equal no wheeze , rales or rhonchi. No chest wall deformities or tenderness.. CV: PMI is nondisplaced, S1 S2 no gallops, murmurs, rubs. Peripheral pulses are full .No JVD .  ABDOMEN: Bowel sounds normal nontender  No guard or rebound, no hepato splenomegal no CVA tenderness.  No hernia. Extremtities: Prominent varicose veins add +2-3 edema of his right lower extremity no acute redness or joint swelling or focal tenderness.  No clubbing cyanosis or , no acute joint swelling or redness no focal atrophy NEURO:  Oriented x3, cranial nerves 3-12 appear to be intact, no obvious focal weakness,gait within normal limits SKIN: No acute rashes normal turgor, color, no bruising or petechiae.  Some chronic  skin changes to right lower extremity but no acute rash. PSYCH: Oriented, good eye contact, no obvious depression anxiety, cognition and judgment appear normal. LN: no cervical axillary inguinal adenopathy  Lab Results  Component Value Date   WBC 8.7 02/02/2019   HGB 16.0 02/02/2019   HCT 46.8 02/02/2019   PLT 233.0 02/02/2019   GLUCOSE 165 (H) 02/02/2019   CHOL 203 (H) 12/26/2018   TRIG 264.0 (H) 12/26/2018   HDL 35.80 (L) 12/26/2018   LDLDIRECT 125.0 12/26/2018   LDLCALC 107 (H) 07/29/2009   ALT 16 02/02/2019   AST 12 02/02/2019   NA 139 02/02/2019   K 4.2 02/02/2019   CL 102 02/02/2019   CREATININE 0.72 02/02/2019   BUN 13 02/02/2019   CO2 27 02/02/2019   TSH 0.99 02/02/2019   HGBA1C 6.7 (A) 12/26/2018   MICROALBUR 0.8 12/26/2018    BP Readings from Last 3 Encounters:  02/02/19 120/66  12/26/18 120/82  04/14/18 119/73   Summary: Right: Findings consistent with age indeterminate deep vein thrombosis involving the right posterior tibial veins, this is a new finding compared to prior exam in 04/2015. Findings consistent with chronic deep vein thrombosis involving the right femoral  vein, and right popliteal vein. Findings appear essentially unchanged compared to previous examination. No cystic structure found in the popliteal fossa.  Left: No evidence of common femoral vein obstruction. Lab results reviewed with patient   ASSESSMENT AND PLAN:  Discussed the following assessment and plan:    ICD-10-CM   1. Visit for preventive health examination  Z00.00 CBC with Differential  TSH    CMP  2. Medication management  Z79.899 CBC with Differential    TSH    CMP  3. DVT, recurrent, lower extremity, acute, right ([name])  I82.401 CBC with Differential    TSH    CMP  4. DVT, HX OF  Z86.718   5. Right leg swelling  M79.89   6. Type 2 diabetes mellitus with hyperglycemia, with long-term current use of insulin ([name])  E11.65 CBC with Differential   Z79.4 TSH    CMP  7.  Erectile dysfunction, unspecified erectile dysfunction type  N52.9 Ambulatory referral to Urology  8. Hyperlipidemia, unspecified hyperlipidemia type  E78.5   9.  anticoagulation  Z79.01    Continue eliquis   May require  Prolonged  Or life long  Anticoagulation based on hx  fam hx and recurrence .  At this time  Continue  Lab monitoring today  Eye check  Pending  Ed  Will refer  Not for work out for 1 weeks  And then reassess situation. Return in about 1 month (around 03/04/2019) for virtual visit  ok .  Patient Care Team: , [name], MD as PCP - General [place], Armando Reichert, MD (Pulmonary Disease) Philemon Kingdom, MD as Consulting Physician (Internal Medicine) Patient Instructions  Leg elevation as discussed  Stay on anticoagulation  meds  Baseline labs today. Plan future  Hem consults but sounds like you need to be on life long anticoagulation albeit can sometimes   change doses.    Continue lifestyle intervention healthy eating and exercise . and weight loss.   Will do urology referral as discussed    Deep Vein Thrombosis  Deep vein thrombosis (DVT) is a condition in which a blood clot forms in a deep vein, such as a lower leg, thigh, or arm vein. A clot is blood that has thickened into a gel or solid. This condition is dangerous. It can lead to serious and even life-threatening complications if the clot travels to the lungs and causes a blockage (pulmonary embolism). It can also damage veins in the leg. This can result in leg pain, swelling, discoloration, and sores (post-thrombotic syndrome). What are the causes? This condition may be caused by:  A slowdown of blood flow.  Damage to a vein.  A condition that causes blood to clot more easily, such as an inherited clotting disorder. What increases the risk? The following factors may make you more likely to develop this condition:  Being overweight.  Being older, especially over age 51.  Sitting or lying down for  more than four hours.  Being in the hospital.  Lack of physical activity (sedentary lifestyle).  Pregnancy, being in childbirth, or having recently given birth.  Taking medicines that contain estrogen, such as medicines to prevent pregnancy.  Smoking.  A history of any of the following: ? Blood clots or a blood clotting disease. ? Peripheral vascular disease. ? Inflammatory bowel disease. ? Cancer. ? Heart disease. ? Genetic conditions that affect how your blood clots, such as Factor V Leiden mutation. ? Neurological diseases that affect your legs (leg paresis). ? A recent injury, such as a car accident. ? Major or lengthy surgery. ? A central line placed inside a large vein. What are the signs or symptoms? Symptoms of this condition include:  Swelling, pain, or tenderness in an arm or leg.  Warmth, redness, or discoloration in an arm or leg. If the clot is in your leg, symptoms may be more noticeable or worse  when you stand or walk. Some people may not develop any symptoms. How is this diagnosed? This condition is diagnosed with:  A medical history and physical exam.  Tests, such as: ? Blood tests. These are done to check how well your blood clots. ? Ultrasound. This is done to check for clots. ? Venogram. For this test, contrast dye is injected into a vein and X-rays are taken to check for any clots. How is this treated? Treatment for this condition depends on:  The cause of your DVT.  Your risk for bleeding or developing more clots.  Any other medical conditions that you have. Treatment may include:  Taking a blood thinner (anticoagulant). This type of medicine prevents clots from forming. It may be taken by mouth, injected under the skin, or injected through an IV (catheter).  Injecting clot-dissolving medicines into the affected vein (catheter-directed thrombolysis).  Having surgery. Surgery may be done to: ? Remove the clot. ? Place a filter in a large  vein to catch blood clots before they reach the lungs. Some treatments may be continued for up to six months. Follow these instructions at home: If you are taking blood thinners:  Take the medicine exactly as told by your health care provider. Some blood thinners need to be taken at the same time every day. Do not skip a dose.  Talk with your health care provider before you take any medicines that contain aspirin or NSAIDs. These medicines increase your risk for dangerous bleeding.  Ask your health care provider about foods and drugs that could change the way the medicine works (may interact). Avoid those things if your health care provider tells you to do so.  Blood thinners can cause easy bruising and may make it difficult to stop bleeding. Because of this: ? Be very careful when using knives, scissors, or other sharp objects. ? Use an electric razor instead of a blade. ? Avoid activities that could cause injury or bruising, and follow instructions about how to prevent falls.  Wear a medical alert bracelet or carry a card that lists what medicines you take. General instructions  Take over-the-counter and prescription medicines only as told by your health care provider.  Return to your normal activities as told by your health care provider. Ask your health care provider what activities are safe for you.  Wear compression stockings if recommended by your health care provider.  Keep all follow-up visits as told by your health care provider. This is important. How is this prevented? To lower your risk of developing this condition again:  For 30 or more minutes every day, do an activity that: ? Involves moving your arms and legs. ? Increases your heart rate.  When traveling for longer than four hours: ? Exercise your arms and legs every hour. ? Drink plenty of water. ? Avoid drinking alcohol.  Avoid sitting or lying for a long time without moving your legs.  If you have surgery  or you are hospitalized, ask about ways to prevent blood clots. These may include taking frequent walks or using anticoagulants.  Stay at a healthy weight.  If you are a woman who is older than age 67, avoid unnecessary use of medicines that contain estrogen, such as some birth control pills.  Do not use any products that contain nicotine or tobacco, such as cigarettes and e-cigarettes. This is especially important if you take estrogen medicines. If you need help quitting, ask your health care provider. Contact a health care  provider if:  You miss a dose of your blood thinner.  Your menstrual period is heavier than usual.  You have unusual bruising. Get help right away if:  You have: ? New or increased pain, swelling, or redness in an arm or leg. ? Numbness or tingling in an arm or leg. ? Shortness of breath. ? Chest pain. ? A rapid or irregular heartbeat. ? A severe headache or confusion. ? A cut that will not stop bleeding.  There is blood in your vomit, stool, or urine.  You have a serious fall or accident, or you hit your head.  You feel light-headed or dizzy.  You cough up blood. These symptoms may represent a serious problem that is an emergency. Do not wait to see if the symptoms will go away. Get medical help right away. Call your local emergency services (911 in the [place] Do not drive yourself to the hospital. Summary  Deep vein thrombosis (DVT) is a condition in which a blood clot forms in a deep vein, such as a lower leg, thigh, or arm vein.  Symptoms can include swelling, warmth, pain, and redness in your leg or arm.  This condition may be treated with a blood thinner (anticoagulant medicine), medicine that is injected to dissolve blood clots,compression stockings, or surgery.  If you are prescribed blood thinners, take them exactly as told. This information is not intended to replace advice given to you by your health care provider. Make sure you discuss any  questions you have with your health care provider. Document Released: 02/26/2005 Document Revised: 02/08/2017 Document Reviewed: 07/27/2016 Elsevier Patient Education  2020 [place] Maintenance, Male Adopting a healthy lifestyle and getting preventive care are important in promoting health and wellness. Ask your health care provider about:  The right schedule for you to have regular tests and exams.  Things you can do on your own to prevent diseases and keep yourself healthy. What should I know about diet, weight, and exercise? Eat a healthy diet   Eat a diet that includes plenty of vegetables, fruits, low-fat dairy products, and lean protein.  Do not eat a lot of foods that are high in solid fats, added sugars, or sodium. Maintain a healthy weight Body mass index (BMI) is a measurement that can be used to identify possible weight problems. It estimates body fat based on height and weight. Your health care provider can help determine your BMI and help you achieve or maintain a healthy weight. Get regular exercise Get regular exercise. This is one of the most important things you can do for your health. Most adults should:  Exercise for at least 150 minutes each week. The exercise should increase your heart rate and make you sweat (moderate-intensity exercise).  Do strengthening exercises at least twice a week. This is in addition to the moderate-intensity exercise.  Spend less time sitting. Even light physical activity can be beneficial. Watch cholesterol and blood lipids Have your blood tested for lipids and cholesterol at 48 years of age, then have this test every 5 years. You may need to have your cholesterol levels checked more often if:  Your lipid or cholesterol levels are high.  You are older than 48 years of age.  You are at high risk for heart disease. What should I know about cancer screening? Many types of cancers can be detected early and may often  be prevented. Depending on your health history and family history, you may need to  have cancer screening at various ages. This may include screening for:  Colorectal cancer.  Prostate cancer.  Skin cancer.  Lung cancer. What should I know about heart disease, diabetes, and high blood pressure? Blood pressure and heart disease  High blood pressure causes heart disease and increases the risk of stroke. This is more likely to develop in people who have high blood pressure readings, are of African descent, or are overweight.  Talk with your health care provider about your target blood pressure readings.  Have your blood pressure checked: ? Every 3-5 years if you are 21-33 years of age. ? Every year if you are 59 years old or older.  If you are between the ages of 46 and 72 and are a current or former smoker, ask your health care provider if you should have a one-time screening for abdominal aortic aneurysm (AAA). Diabetes Have regular diabetes screenings. This checks your fasting blood sugar level. Have the screening done:  Once every three years after age 64 if you are at a normal weight and have a low risk for diabetes.  More often and at a younger age if you are overweight or have a high risk for diabetes. What should I know about preventing infection? Hepatitis B If you have a higher risk for hepatitis B, you should be screened for this virus. Talk with your health care provider to find out if you are at risk for hepatitis B infection. Hepatitis C Blood testing is recommended for:  Everyone born from [birth year] through 1965.  Anyone with known risk factors for hepatitis C. Sexually transmitted infections (STIs)  You should be screened each year for STIs, including gonorrhea and chlamydia, if: ? You are sexually active and are younger than 48 years of age. ? You are older than 48 years of age and your health care provider tells you that you are at risk for this type of infection. ?  Your sexual activity has changed since you were last screened, and you are at increased risk for chlamydia or gonorrhea. Ask your health care provider if you are at risk.  Ask your health care provider about whether you are at high risk for HIV. Your health care provider may recommend a prescription medicine to help prevent HIV infection. If you choose to take medicine to prevent HIV, you should first get tested for HIV. You should then be tested every 3 months for as long as you are taking the medicine. Follow these instructions at home: Lifestyle  Do not use any products that contain nicotine or tobacco, such as cigarettes, e-cigarettes, and chewing tobacco. If you need help quitting, ask your health care provider.  Do not use street drugs.  Do not share needles.  Ask your health care provider for help if you need support or information about quitting drugs. Alcohol use  Do not drink alcohol if your health care provider tells you not to drink.  If you drink alcohol: ? Limit how much you have to 0-2 drinks a day. ? Be aware of how much alcohol is in your drink. In the [place] one drink equals one 12 oz bottle of beer (355 mL), one 5 oz glass of wine (148 mL), or one 1 oz glass of hard liquor (44 mL). General instructions  Schedule regular health, dental, and eye exams.  Stay current with your vaccines.  Tell your health care provider if: ? You often feel depressed. ? You have ever been abused or do not feel  safe at home. Summary  Adopting a healthy lifestyle and getting preventive care are important in promoting health and wellness.  Follow your health care provider's instructions about healthy diet, exercising, and getting tested or screened for diseases.  Follow your health care provider's instructions on monitoring your cholesterol and blood pressure. This information is not intended to replace advice given to you by your health care provider. Make sure you discuss any  questions you have with your health care provider. Document Released: 08/25/2007 Document Revised: 02/19/2018 Document Reviewed: 02/19/2018 Elsevier Patient Education  2020 [place]  M.D.

## 2019-02-02 NOTE — Telephone Encounter (Signed)
Received a report from [place] at [facility]. Per Varney Biles patient has a positive  New DVT in the right calf vein. There is also some old thrombus present.

## 2019-02-02 NOTE — Patient Instructions (Addendum)
Leg elevation as discussed  Stay on anticoagulation  meds  Baseline labs today. Plan future  Hem consults but sounds like you need to be on life long anticoagulation albeit can sometimes   change doses.    Continue lifestyle intervention healthy eating and exercise . and weight loss.   Will do urology referral as discussed    Deep Vein Thrombosis  Deep vein thrombosis (DVT) is a condition in which a blood clot forms in a deep vein, such as a lower leg, thigh, or arm vein. A clot is blood that has thickened into a gel or solid. This condition is dangerous. It can lead to serious and even life-threatening complications if the clot travels to the lungs and causes a blockage (pulmonary embolism). It can also damage veins in the leg. This can result in leg pain, swelling, discoloration, and sores (post-thrombotic syndrome). What are the causes? This condition may be caused by:  A slowdown of blood flow.  Damage to a vein.  A condition that causes blood to clot more easily, such as an inherited clotting disorder. What increases the risk? The following factors may make you more likely to develop this condition:  Being overweight.  Being older, especially over age 28.  Sitting or lying down for more than four hours.  Being in the hospital.  Lack of physical activity (sedentary lifestyle).  Pregnancy, being in childbirth, or having recently given birth.  Taking medicines that contain estrogen, such as medicines to prevent pregnancy.  Smoking.  A history of any of the following: ? Blood clots or a blood clotting disease. ? Peripheral vascular disease. ? Inflammatory bowel disease. ? Cancer. ? Heart disease. ? Genetic conditions that affect how your blood clots, such as Factor V Leiden mutation. ? Neurological diseases that affect your legs (leg paresis). ? A recent injury, such as a car accident. ? Major or lengthy surgery. ? A central line placed inside a large vein. What  are the signs or symptoms? Symptoms of this condition include:  Swelling, pain, or tenderness in an arm or leg.  Warmth, redness, or discoloration in an arm or leg. If the clot is in your leg, symptoms may be more noticeable or worse when you stand or walk. Some people may not develop any symptoms. How is this diagnosed? This condition is diagnosed with:  A medical history and physical exam.  Tests, such as: ? Blood tests. These are done to check how well your blood clots. ? Ultrasound. This is done to check for clots. ? Venogram. For this test, contrast dye is injected into a vein and X-rays are taken to check for any clots. How is this treated? Treatment for this condition depends on:  The cause of your DVT.  Your risk for bleeding or developing more clots.  Any other medical conditions that you have. Treatment may include:  Taking a blood thinner (anticoagulant). This type of medicine prevents clots from forming. It may be taken by mouth, injected under the skin, or injected through an IV (catheter).  Injecting clot-dissolving medicines into the affected vein (catheter-directed thrombolysis).  Having surgery. Surgery may be done to: ? Remove the clot. ? Place a filter in a large vein to catch blood clots before they reach the lungs. Some treatments may be continued for up to six months. Follow these instructions at home: If you are taking blood thinners:  Take the medicine exactly as told by your health care provider. Some blood thinners need to be  taken at the same time every day. Do not skip a dose.  Talk with your health care provider before you take any medicines that contain aspirin or NSAIDs. These medicines increase your risk for dangerous bleeding.  Ask your health care provider about foods and drugs that could change the way the medicine works (may interact). Avoid those things if your health care provider tells you to do so.  Blood thinners can cause easy  bruising and may make it difficult to stop bleeding. Because of this: ? Be very careful when using knives, scissors, or other sharp objects. ? Use an electric razor instead of a blade. ? Avoid activities that could cause injury or bruising, and follow instructions about how to prevent falls.  Wear a medical alert bracelet or carry a card that lists what medicines you take. General instructions  Take over-the-counter and prescription medicines only as told by your health care provider.  Return to your normal activities as told by your health care provider. Ask your health care provider what activities are safe for you.  Wear compression stockings if recommended by your health care provider.  Keep all follow-up visits as told by your health care provider. This is important. How is this prevented? To lower your risk of developing this condition again:  For 30 or more minutes every day, do an activity that: ? Involves moving your arms and legs. ? Increases your heart rate.  When traveling for longer than four hours: ? Exercise your arms and legs every hour. ? Drink plenty of water. ? Avoid drinking alcohol.  Avoid sitting or lying for a long time without moving your legs.  If you have surgery or you are hospitalized, ask about ways to prevent blood clots. These may include taking frequent walks or using anticoagulants.  Stay at a healthy weight.  If you are a woman who is older than age 51, avoid unnecessary use of medicines that contain estrogen, such as some birth control pills.  Do not use any products that contain nicotine or tobacco, such as cigarettes and e-cigarettes. This is especially important if you take estrogen medicines. If you need help quitting, ask your health care provider. Contact a health care provider if:  You miss a dose of your blood thinner.  Your menstrual period is heavier than usual.  You have unusual bruising. Get help right away if:  You have: ?  New or increased pain, swelling, or redness in an arm or leg. ? Numbness or tingling in an arm or leg. ? Shortness of breath. ? Chest pain. ? A rapid or irregular heartbeat. ? A severe headache or confusion. ? A cut that will not stop bleeding.  There is blood in your vomit, stool, or urine.  You have a serious fall or accident, or you hit your head.  You feel light-headed or dizzy.  You cough up blood. These symptoms may represent a serious problem that is an emergency. Do not wait to see if the symptoms will go away. Get medical help right away. Call your local emergency services (911 in the [place] Do not drive yourself to the hospital. Summary  Deep vein thrombosis (DVT) is a condition in which a blood clot forms in a deep vein, such as a lower leg, thigh, or arm vein.  Symptoms can include swelling, warmth, pain, and redness in your leg or arm.  This condition may be treated with a blood thinner (anticoagulant medicine), medicine that is injected to dissolve blood  clots,compression stockings, or surgery.  If you are prescribed blood thinners, take them exactly as told. This information is not intended to replace advice given to you by your health care provider. Make sure you discuss any questions you have with your health care provider. Document Released: 02/26/2005 Document Revised: 02/08/2017 Document Reviewed: 07/27/2016 Elsevier Patient Education  2020 [place] Maintenance, Male Adopting a healthy lifestyle and getting preventive care are important in promoting health and wellness. Ask your health care provider about:  The right schedule for you to have regular tests and exams.  Things you can do on your own to prevent diseases and keep yourself healthy. What should I know about diet, weight, and exercise? Eat a healthy diet   Eat a diet that includes plenty of vegetables, fruits, low-fat dairy products, and lean protein.  Do not eat a lot of foods  that are high in solid fats, added sugars, or sodium. Maintain a healthy weight Body mass index (BMI) is a measurement that can be used to identify possible weight problems. It estimates body fat based on height and weight. Your health care provider can help determine your BMI and help you achieve or maintain a healthy weight. Get regular exercise Get regular exercise. This is one of the most important things you can do for your health. Most adults should:  Exercise for at least 150 minutes each week. The exercise should increase your heart rate and make you sweat (moderate-intensity exercise).  Do strengthening exercises at least twice a week. This is in addition to the moderate-intensity exercise.  Spend less time sitting. Even light physical activity can be beneficial. Watch cholesterol and blood lipids Have your blood tested for lipids and cholesterol at 48 years of age, then have this test every 5 years. You may need to have your cholesterol levels checked more often if:  Your lipid or cholesterol levels are high.  You are older than 48 years of age.  You are at high risk for heart disease. What should I know about cancer screening? Many types of cancers can be detected early and may often be prevented. Depending on your health history and family history, you may need to have cancer screening at various ages. This may include screening for:  Colorectal cancer.  Prostate cancer.  Skin cancer.  Lung cancer. What should I know about heart disease, diabetes, and high blood pressure? Blood pressure and heart disease  High blood pressure causes heart disease and increases the risk of stroke. This is more likely to develop in people who have high blood pressure readings, are of African descent, or are overweight.  Talk with your health care provider about your target blood pressure readings.  Have your blood pressure checked: ? Every 3-5 years if you are 80-43 years of age. ?  Every year if you are 44 years old or older.  If you are between the ages of 45 and 78 and are a current or former smoker, ask your health care provider if you should have a one-time screening for abdominal aortic aneurysm (AAA). Diabetes Have regular diabetes screenings. This checks your fasting blood sugar level. Have the screening done:  Once every three years after age 78 if you are at a normal weight and have a low risk for diabetes.  More often and at a younger age if you are overweight or have a high risk for diabetes. What should I know about preventing infection? Hepatitis B If you have  a higher risk for hepatitis B, you should be screened for this virus. Talk with your health care provider to find out if you are at risk for hepatitis B infection. Hepatitis C Blood testing is recommended for:  Everyone born from [birth year] through 1965.  Anyone with known risk factors for hepatitis C. Sexually transmitted infections (STIs)  You should be screened each year for STIs, including gonorrhea and chlamydia, if: ? You are sexually active and are younger than 48 years of age. ? You are older than 48 years of age and your health care provider tells you that you are at risk for this type of infection. ? Your sexual activity has changed since you were last screened, and you are at increased risk for chlamydia or gonorrhea. Ask your health care provider if you are at risk.  Ask your health care provider about whether you are at high risk for HIV. Your health care provider may recommend a prescription medicine to help prevent HIV infection. If you choose to take medicine to prevent HIV, you should first get tested for HIV. You should then be tested every 3 months for as long as you are taking the medicine. Follow these instructions at home: Lifestyle  Do not use any products that contain nicotine or tobacco, such as cigarettes, e-cigarettes, and chewing tobacco. If you need help quitting, ask your  health care provider.  Do not use street drugs.  Do not share needles.  Ask your health care provider for help if you need support or information about quitting drugs. Alcohol use  Do not drink alcohol if your health care provider tells you not to drink.  If you drink alcohol: ? Limit how much you have to 0-2 drinks a day. ? Be aware of how much alcohol is in your drink. In the [place] one drink equals one 12 oz bottle of beer (355 mL), one 5 oz glass of wine (148 mL), or one 1 oz glass of hard liquor (44 mL). General instructions  Schedule regular health, dental, and eye exams.  Stay current with your vaccines.  Tell your health care provider if: ? You often feel depressed. ? You have ever been abused or do not feel safe at home. Summary  Adopting a healthy lifestyle and getting preventive care are important in promoting health and wellness.  Follow your health care provider's instructions about healthy diet, exercising, and getting tested or screened for diseases.  Follow your health care provider's instructions on monitoring your cholesterol and blood pressure. This information is not intended to replace advice given to you by your health care provider. Make sure you discuss any questions you have with your health care provider. Document Released: 08/25/2007 Document Revised: 02/19/2018 Document Reviewed: 02/19/2018 Elsevier Patient Education  2020 [employer].

## 2019-02-03 NOTE — Telephone Encounter (Signed)
Pt was given results in office

## 2019-02-09 ENCOUNTER — Telehealth: Payer: Self-pay

## 2019-02-09 NOTE — Telephone Encounter (Signed)
Paper work for  [employer] for pts work is in [employer] to be filled out

## 2019-02-13 DIAGNOSIS — Z0279 Encounter for issue of other medical certificate: Secondary | ICD-10-CM

## 2019-02-13 NOTE — Telephone Encounter (Signed)
Form commpleted  Please arrange cahrge form  Send copy of [name] with the form  Make sure  Copy is  Scanned into record for future reference.

## 2019-02-16 NOTE — Telephone Encounter (Signed)
Form has been faxed and placed in scan  

## 2019-02-19 ENCOUNTER — Other Ambulatory Visit: Payer: Self-pay | Admitting: Internal Medicine

## 2019-02-19 DIAGNOSIS — E11319 Type 2 diabetes mellitus with unspecified diabetic retinopathy without macular edema: Secondary | ICD-10-CM

## 2019-02-20 ENCOUNTER — Other Ambulatory Visit: Payer: Self-pay | Admitting: Internal Medicine

## 2019-03-02 ENCOUNTER — Encounter: Payer: Self-pay | Admitting: Podiatry

## 2019-03-02 DIAGNOSIS — N5201 Erectile dysfunction due to arterial insufficiency: Secondary | ICD-10-CM | POA: Diagnosis not present

## 2019-03-03 ENCOUNTER — Telehealth: Payer: Self-pay | Admitting: Internal Medicine

## 2019-03-03 MED ORDER — GABAPENTIN 300 MG PO CAPS: 300.0000 mg | ORAL_CAPSULE | Freq: Three times a day (TID) | ORAL | 3 refills | Status: DC

## 2019-03-03 NOTE — Telephone Encounter (Signed)
Pt came in and dropped off a FMLA form to be completed by the provider.  Upon completion pt would like for it to be faxed to [phone] with the control # [id number] to be referenced.  Form placed in providers folder for completion.

## 2019-03-03 NOTE — Telephone Encounter (Signed)
Informed pt gabapentin 300mg  had been refill for 90 days with 3 refills sent to [facility].

## 2019-03-03 NOTE — Telephone Encounter (Signed)
Form placed in red folder  

## 2019-03-08 NOTE — Progress Notes (Signed)
Virtual Visit via Video Note  I connected with@ on _0 at  8:30 AM EST by a video enabled telemedicine application and verified that I am speaking with the correct person using two identifiers. Location patient: home Location provider home office Persons participating in the virtual visit: patient, provider  [place] national recommendations  regarding COVID 19 pandemic   video visit is advised over in office visit for this patient.  Patient aware  of the limitations of evaluation and management by telemedicine and  availability of in person appointments. and agreed to proceed.   HPI: Troy [name] presents for video visit fu.  DVT recurrent :  RLE  Pain is down  1-3 but swelling is the same  Even though elevated when possible  No bleeding new sx .  anticoagulation  Urology referral last time was disappointed  That causes evaluation not explored   and said dm cause and rx viagra  FMLA form    For the week  End of November  Back to work since dec 1  Dm still goo control. Bp   125/68  Sometimes lower   ROS: See pertinent positives and negatives per HPI. Has cold hands  And feet  For a while No  Anemia .   Past Medical History:  Diagnosis Date  . Arm weakness    right   . Asthma   . Diabetes insipidus ([place])   . Diabetes mellitus without complication ([place])   . DVT (deep venous thrombosis) ([place])   . Obesity   . OSA (obstructive sleep apnea)     Past Surgical History:  Procedure Laterality Date  . BACK SURGERY    . CERVICAL DISC SURGERY     C6/C7 2009  . cubital tunnel left arm     2003  . ELBOW SURGERY    . FIBULA FRACTURE SURGERY     plate & pin removed due to infection 1997  . ULNAR NERVE REPAIR      Family History  Problem Relation Age of Onset  . Heart disease Mother   . Other Mother        clotting disorders  . Diabetes Mother   . Other Father        sudden death/cervical and lumbar disk disease  . Aneurysm Father        In his 72s smoked  .  Hyperlipidemia Father   . Hypertension Other   . Allergies Other   . Deep vein thrombosis Other   . Sleep apnea Other   . Obesity Other         Current Outpatient Medications:  .  albuterol (PROVENTIL HFA;VENTOLIN HFA) 108 (90 Base) MCG/ACT inhaler, Inhale 2 puffs into the lungs every 6 (six) hours as needed. For wheezing, Disp: 1 Inhaler, Rfl: 2 .  atorvastatin (LIPITOR) 40 MG tablet, Take 1 tablet (40 mg total) by mouth daily., Disp: 90 tablet, Rfl: 2 .  canagliflozin (INVOKANA) 100 MG TABS tablet, Take 1 tablet (100 mg total) by mouth daily before breakfast., Disp: 90 tablet, Rfl: 3 .  Continuous Blood Gluc Sensor (FREESTYLE LIBRE 14 DAY SENSOR) MISC, 1 EACH BY DOES NOT APPLY ROUTE EVERY 14 (FOURTEEN) DAYS. CHANGE EVERY 2 WEEKS, Disp: 2 each, Rfl: 11 .  ELIQUIS 5 MG TABS tablet, TAKE 2 TABLETS(10 MG) BY MOUTH TWICE DAILY FOR 7 DAYS THEN TAKE 1 TABLET(5 MG) BY MOUTH TWICE DAILY, Disp: 60 tablet, Rfl: 0 .  gabapentin (NEURONTIN) 300 MG capsule, Take 1  capsule (300 mg total) by mouth 3 (three) times daily., Disp: 90 capsule, Rfl: 2 .  gabapentin (NEURONTIN) 300 MG capsule, Take 1 capsule (300 mg total) by mouth 3 (three) times daily., Disp: 270 capsule, Rfl: 3 .  Insulin Disposable Pump (V-GO 30) KIT, USE 1 TIME A DAY., Disp: 90 kit, Rfl: 3 .  insulin lispro (HUMALOG) 100 UNIT/ML cartridge, INJECT UP TO 80 UNITS DAILY-VGO 30 SYSTEM., Disp: 90 mL, Rfl: 11 .  metFORMIN (GLUCOPHAGE-XR) 500 MG 24 hr tablet, TAKE 4 TABLETS DAILY WITH BREAKFAST, Disp: 360 tablet, Rfl: 3 .  Semaglutide, 1 MG/DOSE, (OZEMPIC, 1 MG/DOSE,) 2 MG/1.5ML SOPN, Inject 1 mg into the skin once a week., Disp: 6 pen, Rfl: 3  EXAM: BP Readings from Last 3 Encounters:  02/02/19 120/66  12/26/18 120/82  04/14/18 119/73    VITALS per patient if applicable:  GENERAL: alert, oriented, appears well and in no acute distress  HEENT: atraumatic, conjunttiva clear, no obvious abnormalities on inspection of external nose and  ears  NECK: normal movements of the head and neck  LUNGS: on inspection no signs of respiratory distress, breathing rate appears normal, no obvious gross SOB, gasping or wheezing  CV: no obvious cyanosis  MS: moves all visible extremitiesrle no redness still larger  chornic changes   PSYCH/NEURO: pleasant and cooperative, no obvious depression or anxiety, speech and thought processing grossly intact Lab Results  Component Value Date   WBC 8.7 02/02/2019   HGB 16.0 02/02/2019   HCT 46.8 02/02/2019   PLT 233.0 02/02/2019   GLUCOSE 165 (H) 02/02/2019   CHOL 203 (H) 12/26/2018   TRIG 264.0 (H) 12/26/2018   HDL 35.80 (L) 12/26/2018   LDLDIRECT 125.0 12/26/2018   LDLCALC 107 (H) 07/29/2009   ALT 16 02/02/2019   AST 12 02/02/2019   NA 139 02/02/2019   K 4.2 02/02/2019   CL 102 02/02/2019   CREATININE 0.72 02/02/2019   BUN 13 02/02/2019   CO2 27 02/02/2019   TSH 0.99 02/02/2019   HGBA1C 6.7 (A) 12/26/2018   MICROALBUR 0.8 12/26/2018    ASSESSMENT AND PLAN:  Discussed the following assessment and plan:    ICD-10-CM   1. DVT, recurrent, lower extremity, acute, right ([place])  I82.401 Ambulatory referral to Vascular Surgery  2. Medication management  Z79.899   3. Right leg swelling  M79.89 Ambulatory referral to Vascular Surgery  4. Erectile dysfunction, unspecified erectile dysfunction type  N52.9   5.  anticoagulation  Z79.01   6. Chronic deep vein thrombosis (DVT) of right lower extremity, unspecified vein ([name])  I82.501 Ambulatory referral to Vascular Surgery   Would have expected more mprovement in his edema of RLE his history is that of  recurrent right lower extremity DVT.  At this point maintain anticoagulation physical modalities and obtain a vascular consult about any other interventions or future treatment management advice. Patient agrees. In regard to the ED he could have hypogonadism cause of the ED as opposed to vascular.neuro    His risk factors are controlled  suggest we ask Dr. Cruzita Lederer  endocrinology for any input or advice. Counseled.   Expectant management and discussion of plan and treatment with opportunity to ask questions and all were answered. The patient agreed with the plan and demonstrated an understanding of the instructions. Will do  fmla form  Advised to call back or seek an in-person evaluation if worsening  or having  further concerns . In interim  No follow-ups on file.  Shanon Ace,  MD

## 2019-03-09 ENCOUNTER — Other Ambulatory Visit: Payer: Self-pay

## 2019-03-09 ENCOUNTER — Encounter: Payer: Self-pay | Admitting: Internal Medicine

## 2019-03-09 ENCOUNTER — Telehealth (INDEPENDENT_AMBULATORY_CARE_PROVIDER_SITE_OTHER): Payer: BC Managed Care – PPO | Admitting: Internal Medicine

## 2019-03-09 DIAGNOSIS — Z7901 Long term (current) use of anticoagulants: Secondary | ICD-10-CM

## 2019-03-09 DIAGNOSIS — Z79899 Other long term (current) drug therapy: Secondary | ICD-10-CM

## 2019-03-09 DIAGNOSIS — N529 Male erectile dysfunction, unspecified: Secondary | ICD-10-CM | POA: Diagnosis not present

## 2019-03-09 DIAGNOSIS — M7989 Other specified soft tissue disorders: Secondary | ICD-10-CM

## 2019-03-09 DIAGNOSIS — I82501 Chronic embolism and thrombosis of unspecified deep veins of right lower extremity: Secondary | ICD-10-CM

## 2019-03-09 DIAGNOSIS — I82401 Acute embolism and thrombosis of unspecified deep veins of right lower extremity: Secondary | ICD-10-CM | POA: Diagnosis not present

## 2019-03-17 NOTE — Telephone Encounter (Signed)
These 2 forms have been done and signed but  He didn't sign  One of the forms   You can send it anyway and let  Him know  That he didn't sign. ( not sure if will make a difference )

## 2019-03-18 NOTE — Telephone Encounter (Signed)
Pt is going to come sign form and then will be faxed

## 2019-03-22 ENCOUNTER — Other Ambulatory Visit: Payer: Self-pay | Admitting: Internal Medicine

## 2019-04-20 DIAGNOSIS — G43819 Other migraine, intractable, without status migrainosus: Secondary | ICD-10-CM | POA: Diagnosis not present

## 2019-04-20 DIAGNOSIS — E113213 Type 2 diabetes mellitus with mild nonproliferative diabetic retinopathy with macular edema, bilateral: Secondary | ICD-10-CM | POA: Diagnosis not present

## 2019-04-20 DIAGNOSIS — H52223 Regular astigmatism, bilateral: Secondary | ICD-10-CM | POA: Diagnosis not present

## 2019-04-20 DIAGNOSIS — H2513 Age-related nuclear cataract, bilateral: Secondary | ICD-10-CM | POA: Diagnosis not present

## 2019-04-20 DIAGNOSIS — H04123 Dry eye syndrome of bilateral lacrimal glands: Secondary | ICD-10-CM | POA: Diagnosis not present

## 2019-04-20 DIAGNOSIS — H524 Presbyopia: Secondary | ICD-10-CM | POA: Diagnosis not present

## 2019-04-21 ENCOUNTER — Other Ambulatory Visit: Payer: Self-pay

## 2019-04-21 ENCOUNTER — Encounter: Payer: Self-pay | Admitting: Vascular Surgery

## 2019-04-21 ENCOUNTER — Ambulatory Visit (HOSPITAL_COMMUNITY)
Admission: RE | Admit: 2019-04-21 | Discharge: 2019-04-21 | Disposition: A | Discharge status: Home / Self Care | Payer: BC Managed Care – PPO | Source: Ambulatory Visit | Attending: Vascular Surgery | Admitting: Vascular Surgery

## 2019-04-21 ENCOUNTER — Ambulatory Visit (INDEPENDENT_AMBULATORY_CARE_PROVIDER_SITE_OTHER): Payer: BC Managed Care – PPO | Admitting: Vascular Surgery

## 2019-04-21 VITALS — BP 114/79 | HR 100 | Temp 97.4°F | Resp 20 | Ht 70.0 in | Wt 284.0 lb

## 2019-04-21 DIAGNOSIS — M79604 Pain in right leg: Secondary | ICD-10-CM

## 2019-04-21 DIAGNOSIS — I82411 Acute embolism and thrombosis of right femoral vein: Secondary | ICD-10-CM | POA: Diagnosis not present

## 2019-04-21 DIAGNOSIS — M7989 Other specified soft tissue disorders: Secondary | ICD-10-CM | POA: Insufficient documentation

## 2019-04-21 NOTE — Progress Notes (Signed)
Vascular and Vein Specialist of   Patient name: Troy [name] MRN: [id number] DOB: [birth year] Sex: male  REASON FOR CONSULT: Discussion of recurrent DVT  HPI: Troy [name] is a 49 y.o. male, who is here today for discussion of recurrent DVT.  Very pleasant gentleman who initially had his first episode of DVT in 2003.  He was appropriately treated with anticoagulation.  He had undergone a long car ride was the only potential provoking event.  No trauma and no history of hypercoagulability.  He did well and then had recurrent DVT in 2017.  Again potentially around timing of a long motor vehicle rod following a cruise.  He again was treated with anticoagulation and then discontinued.  He recently had a new episode of pain and swelling in his calf and underwent the duplex suggesting new DVT.  He is now back on anticoagulation therapy.  He has no history of pulmonary embolus.  He does have a history of pelvic DVT in his mother and she did suffer nonfatal pulmonary embolus.  He has history of abdominal aortic aneurysm in his maternal and paternal ancestry.  Has undergone CT scan showing no evidence of aneurysm.  He is very compliant with elevation and also with knee-high compression  Past Medical History:  Diagnosis Date  . Arm weakness    right   . Asthma   . Diabetes insipidus ([place])   . Diabetes mellitus without complication ([place])   . DVT (deep venous thrombosis) ([place])   . Obesity   . OSA (obstructive sleep apnea)     Family History  Problem Relation Age of Onset  . Heart disease Mother   . Other Mother        clotting disorders  . Diabetes Mother   . Other Father        sudden death/cervical and lumbar disk disease  . Aneurysm Father        In his 21s smoked  . Hyperlipidemia Father   . Hypertension Other   . Allergies Other   . Deep vein thrombosis Other   . Sleep apnea Other   . Obesity Other     SOCIAL HISTORY: Social  History   Socioeconomic History  . Marital status: Married    Spouse name: Not on file  . Number of children: 1  . Years of education: Not on file  . Highest education level: Not on file  Occupational History  . Occupation: Teacher, [employer] years/pre  Tobacco Use  . Smoking status: Never Smoker  . Smokeless tobacco: Never Used  Substance and Sexual Activity  . Alcohol use: Yes    Comment: 1 drink mo.  . Drug use: No  . Sexual activity: Yes  Other Topics Concern  . Not on file  Social History Narrative   Occupation:  days Time [employer] 11- 8 am  Now changed job and shift to [employer] through Thursday    Working  Ft 40 hours mostly   Married _0 Wife s/p bariatric surgery pt   Regular exercise- yes   Pt does have children   Father died suddenly 05/14/2008   Mom passes 2017  Acute rep disease after acute renal failure   Daily caffeine use one a day   2 dogs and cat.             Social Determinants of Health   Financial Resource Strain:   . Difficulty of Paying Living Expenses: Not  on file  Food Insecurity:   . Worried About Charity fundraiser in the Last Year: Not on file  . Ran Out of Food in the Last Year: Not on file  Transportation Needs:   . Lack of Transportation (Medical): Not on file  . Lack of Transportation (Non-Medical): Not on file  Physical Activity:   . Days of Exercise per Week: Not on file  . Minutes of Exercise per Session: Not on file  Stress:   . Feeling of Stress : Not on file  Social Connections:   . Frequency of Communication with Friends and Family: Not on file  . Frequency of Social Gatherings with Friends and Family: Not on file  . Attends Religious Services: Not on file  . Active Member of Clubs or Organizations: Not on file  . Attends Archivist Meetings: Not on file  . Marital Status: Not on file  Intimate Partner Violence:   . Fear of Current or Ex-Partner: Not on file  . Emotionally Abused: Not on file  . Physically  Abused: Not on file  . Sexually Abused: Not on file    No Known Allergies  Current Outpatient Medications  Medication Sig Dispense Refill  . albuterol (PROVENTIL HFA;VENTOLIN HFA) 108 (90 Base) MCG/ACT inhaler Inhale 2 puffs into the lungs every 6 (six) hours as needed. For wheezing 1 Inhaler 2  . atorvastatin (LIPITOR) 40 MG tablet Take 1 tablet (40 mg total) by mouth daily. 90 tablet 2  . canagliflozin (INVOKANA) 100 MG TABS tablet Take 1 tablet (100 mg total) by mouth daily before breakfast. 90 tablet 3  . Continuous Blood Gluc Sensor (FREESTYLE LIBRE 14 DAY SENSOR) MISC 1 EACH BY DOES NOT APPLY ROUTE EVERY 14 (FOURTEEN) DAYS. CHANGE EVERY 2 WEEKS 2 each 11  . ELIQUIS 5 MG TABS tablet TAKE 2 TABLETS(10 MG) BY MOUTH TWICE DAILY FOR 7 DAYS THEN TAKE 1 TABLET(5 MG) BY MOUTH TWICE DAILY 60 tablet 0  . gabapentin (NEURONTIN) 300 MG capsule Take 1 capsule (300 mg total) by mouth 3 (three) times daily. 270 capsule 3  . Insulin Disposable Pump (V-GO 30) KIT USE 1 TIME A DAY. 90 kit 3  . insulin lispro (HUMALOG) 100 UNIT/ML cartridge INJECT UP TO 80 UNITS DAILY-VGO 30 SYSTEM. 90 mL 11  . metFORMIN (GLUCOPHAGE-XR) 500 MG 24 hr tablet TAKE 4 TABLETS DAILY WITH BREAKFAST 360 tablet 3  . Semaglutide, 1 MG/DOSE, (OZEMPIC, 1 MG/DOSE,) 2 MG/1.5ML SOPN Inject 1 mg into the skin once a week. 6 pen 3   No current facility-administered medications for this visit.    REVIEW OF SYSTEMS:  _0  denotes positive finding, _1  denotes negative finding Cardiac  Comments:  Chest pain or chest pressure:    Shortness of breath upon exertion:    Short of breath when lying flat:    Irregular heart rhythm:        Vascular    Pain in calf, thigh, or hip brought on by ambulation: x   Pain in feet at night that wakes you up from your sleep:  x   Blood clot in your veins: x   Leg swelling:  x       Pulmonary    Oxygen at home:    Productive cough:     Wheezing:         Neurologic    Sudden weakness in arms or  legs:     Sudden numbness in arms or legs:  Sudden onset of difficulty speaking or slurred speech:    Temporary loss of vision in one eye:     Problems with dizziness:         Gastrointestinal    Blood in stool:     Vomited blood:         Genitourinary    Burning when urinating:     Blood in urine:        Psychiatric    Major depression:         Hematologic    Bleeding problems:    Problems with blood clotting too easily:        Skin    Rashes or ulcers:        Constitutional    Fever or chills:      PHYSICAL EXAM: Vitals:   04/21/19 1438  BP: 114/79  Pulse: 100  Resp: 20  Temp: (!) 97.4 F (36.3 C)  SpO2: 95%  Weight: 284 lb (128.8 kg)  Height: _0  (1.778 m)    GENERAL: The patient is a well-nourished male, in no acute distress. The vital signs are documented above. CARDIOVASCULAR: 2+ radial and 2+ dorsalis pedis pulses bilaterally. PULMONARY: There is good air exchange  ABDOMEN: Soft and non-tender  MUSCULOSKELETAL: There are no major deformities or cyanosis. NEUROLOGIC: No focal weakness or paresthesias are detected. SKIN: There are no ulcers or rashes noted.  Does have changes of venous hypertension with hemosiderin deposit on his right lateral distal calf above his ankle.  No ulceration  PSYCHIATRIC: The patient has a normal affect.  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  He does have reflux in the segments which are not totally occluded.  He has enlargement and reflux in his great saphenous vein  MEDICAL ISSUES: Had long discussion with the patient regarding this.  I would not recommend ablation of his great saphenous vein with his history of recurrent DVT and persistent thrombus in his deep system.  I feel this is needed for saphenous vein as a collateral for outflow outweighs any potential benefit for removal of the refluxing saphenous vein.  Explained that his only  other treatment option is continued reliance on elevation and compression.  He has tried thigh-high graduated compression and this is not practical due to his large size.  I encouraged him to continue to be persistent with his knee-high compression and elevation.  Since he has had 3 separate events of DVT, I do feel that he will need lifelong anticoagulation unless he develops a contraindication.  He was comfortable with this discussion will see [name] again on an as-needed basis   Rosetta Posner, MD [facility] Vascular and Vein Specialists of [facility] Tel [phone] Pager [phone]

## 2019-04-27 ENCOUNTER — Encounter: Payer: Self-pay | Admitting: Internal Medicine

## 2019-04-27 ENCOUNTER — Ambulatory Visit (INDEPENDENT_AMBULATORY_CARE_PROVIDER_SITE_OTHER): Payer: BC Managed Care – PPO | Admitting: Internal Medicine

## 2019-04-27 ENCOUNTER — Other Ambulatory Visit: Payer: Self-pay

## 2019-04-27 VITALS — BP 110/60 | HR 78 | Ht 70.0 in | Wt 287.0 lb

## 2019-04-27 DIAGNOSIS — E11319 Type 2 diabetes mellitus with unspecified diabetic retinopathy without macular edema: Secondary | ICD-10-CM | POA: Diagnosis not present

## 2019-04-27 DIAGNOSIS — E785 Hyperlipidemia, unspecified: Secondary | ICD-10-CM

## 2019-04-27 DIAGNOSIS — N529 Male erectile dysfunction, unspecified: Secondary | ICD-10-CM | POA: Diagnosis not present

## 2019-04-27 DIAGNOSIS — Z794 Long term (current) use of insulin: Secondary | ICD-10-CM

## 2019-04-27 DIAGNOSIS — Z6841 Body Mass Index (BMI) 40.0 and over, adult: Secondary | ICD-10-CM

## 2019-04-27 LAB — POCT GLYCOSYLATED HEMOGLOBIN (HGB A1C): Hemoglobin A1C: 7.5 % — AB (ref 4.0–5.6)

## 2019-04-27 LAB — VITAMIN B12: Vitamin B-12: 237 pg/mL (ref 211–911)

## 2019-04-27 LAB — TSH: TSH: 1.32 u[IU]/mL (ref 0.35–4.50)

## 2019-04-27 MED ORDER — FREESTYLE LIBRE 14 DAY SENSOR MISC: 11 refills | Status: DC

## 2019-04-27 NOTE — Patient Instructions (Addendum)
Please continue: - Metformin XR 2000 mg at dinnertime - Invokana 100 mg before b'fast  - Ozempic 1 mg weekly  Please continue: - VGo 30: 6 clicks per meal, but try to change it at night  Let me know if the sugars are not better in 3 weeks.  Scan the sensor at least every 8h.  Please return in 3-4 months with your sugar log.

## 2019-04-27 NOTE — Progress Notes (Addendum)
Subjective:     Patient ID: Troy [name], male   DOB: 11/26/1970, 49 y.o.   MRN: [id number]  This visit occurred during the SARS-CoV-2 public health emergency.  Safety protocols were in place, including screening questions prior to the visit, additional usage of staff PPE, and extensive cleaning of exam room while observing appropriate contact time as indicated for disinfecting solutions.   HPI Troy [name] is a pleasant 49 y.o. man, returning for management of DM2, dx 2012, insulin-dependent, with complications (DR), uncontrolled.  Last visit 4 months ago.  His sugars started to improve after switching to night shift to day shift.  He had another DVT in R leg since last OV (his 3rd: 2003, 2017, 2020) >> on Eliquis. Sugars are at least 20 points higher after she started Eliquis.  Reviewed HbA1c levels:  Lab Results  Component Value Date   HGBA1C 6.7 (A) 12/26/2018   HGBA1C 7.3 (A) 09/16/2017   HGBA1C 8.7 06/10/2017   He is on: - Metformin XR 2000 mg in a.m. - Invokana 100 mg before breakfast - VGo 40 >> 30: 2-3 clicks per meal >> 6 clicks per meal - Ozempic 0.5 mg weekly-started 06/2017 >> 1 mg weekly He was on Victoza before >> no SEs.   Pt.checks his sugars more than 4 times a day with his CGM: Freestyle libre CGM parameters: - Average: 160 - % active CGM time: 30% of the time - Glucose variability 25.5% (target < or = to 36%) - time in range:  - very low (<54): 0% - low (54-69): 0% - normal range (70-180): 72% - high sugars (181-250): 26% - very high sugars (>250): 2%   Prev.: - b'fast:  150-220 >> 134-190 >> 98, 106-148, 198 - 2h after b'fast:  203-298 >> 169-214 - before lunch: 165-174 >> 100-180, 210 >> n/c - 2h after lunch: 215-259 >> 100-150, 200 >> 88-127 - predinner:  161-185 >> 117-177 >> 105-124 - bedtime: 183-270 >> 133-244 >> 130-160 Lowest: 113 >> 77 >> 78 >> 105 Highest: 300s >> 243 >> 200s  -No CKD: Lab Results  Component Value Date   BUN 13  02/02/2019   BUN 17 12/26/2018   CREATININE 0.72 02/02/2019   CREATININE 0.79 12/26/2018   No MAU: Lab Results  Component Value Date   MICRALBCREAT 0.5 12/26/2018   MICRALBCREAT 1.4 07/22/2017   MICRALBCREAT 2.2 12/18/2016   MICRALBCREAT 5.8 10/18/2015   MICRALBCREAT 2.0 03/16/2013   MICRALBCREAT 1.6 11/06/2012   MICRALBCREAT 2.5 07/31/2012   MICRALBCREAT 3.6 11/26/2011   MICRALBCREAT 2.3 11/28/2010   MICRALBCREAT 0.6 07/29/2009  Is not on an ACE inhibitor/ARB. -Sign HL: Lab Results  Component Value Date   CHOL 203 (H) 12/26/2018   HDL 35.80 (L) 12/26/2018   LDLCALC 107 (H) 07/29/2009   LDLDIRECT 125.0 12/26/2018   TRIG 264.0 (H) 12/26/2018   CHOLHDL 6 12/26/2018  On Lipitor 20. - Last eye exam was in 04/2019: + DR >> will see retina specialist. - he denies numbness and tingling in feet - sees podiatry.  He is on gabapentin.  He has OSA and is compliant with his CPAP  He had a DVT in R leg in 04/2015.  On Eliquis. He lost 45 lbs before 2017 - mostly vegan food.   He also has erectile dysfunction. He did see urology before and was given Viagra, but causes for ED were not explored.  Review of Systems Constitutional: no weight gain/no weight loss, no fatigue, no subjective hyperthermia, no  subjective hypothermia Eyes: no blurry vision, no xerophthalmia ENT: no sore throat, no nodules palpated in neck, no dysphagia, no odynophagia, no hoarseness Cardiovascular: no CP/no SOB/no palpitations/no leg swelling Respiratory: no cough/no SOB/no wheezing Gastrointestinal: no N/no V/no D/no C/no acid reflux Musculoskeletal: + muscle aches/no joint aches Skin: no hair loss, + chronic rash on right shin Neurological: no tremors/no numbness/no tingling/no dizziness  I reviewed pt's medications, allergies, PMH, social hx, family hx, and changes were documented in the history of present illness. Otherwise, unchanged from my initial visit note.  Past Medical History:  Diagnosis Date   . Arm weakness    right   . Asthma   . Diabetes insipidus ([place])   . Diabetes mellitus without complication ([place])   . DVT (deep venous thrombosis) ([place])   . Obesity   . OSA (obstructive sleep apnea)    Past Surgical History:  Procedure Laterality Date  . BACK SURGERY    . CERVICAL DISC SURGERY     C6/C7 2009  . cubital tunnel left arm     2003  . ELBOW SURGERY    . FIBULA FRACTURE SURGERY     plate & pin removed due to infection 1997  . ULNAR NERVE REPAIR     Social History   Socioeconomic History  . Marital status: Married    Spouse name: Not on file  . Number of children: 1  . Years of education: Not on file  . Highest education level: Not on file  Occupational History  . Occupation: Teacher, early years/pre  Tobacco Use  . Smoking status: Never Smoker  . Smokeless tobacco: Never Used  Substance and Sexual Activity  . Alcohol use: Yes    Comment: 1 drink mo.  . Drug use: No  . Sexual activity: Yes  Other Topics Concern  . Not on file  Social History Narrative   Occupation:  days Time [employer] 11- 8 am  Now changed job and shift to [employer] through Thursday    Working  Ft 40 hours mostly   Married '10 10 10   ' Wife s/p bariatric surgery pt   Regular exercise- yes   Pt does have children   Father died suddenly 05-17-08   Mom passes 2017  Acute rep disease after acute renal failure   Daily caffeine use one a day   2 dogs and cat.             Social Determinants of Health   Financial Resource Strain:   . Difficulty of Paying Living Expenses: Not on file  Food Insecurity:   . Worried About Charity fundraiser in the Last Year: Not on file  . Ran Out of Food in the Last Year: Not on file  Transportation Needs:   . Lack of Transportation (Medical): Not on file  . Lack of Transportation (Non-Medical): Not on file  Physical Activity:   . Days of Exercise per Week: Not on file  . Minutes of Exercise per Session: Not on file  Stress:   . Feeling of Stress : Not  on file  Social Connections:   . Frequency of Communication with Friends and Family: Not on file  . Frequency of Social Gatherings with Friends and Family: Not on file  . Attends Religious Services: Not on file  . Active Member of Clubs or Organizations: Not on file  . Attends Archivist Meetings: Not on file  . Marital Status: Not on file  Intimate  Partner Violence:   . Fear of Current or Ex-Partner: Not on file  . Emotionally Abused: Not on file  . Physically Abused: Not on file  . Sexually Abused: Not on file   Current Outpatient Medications on File Prior to Visit  Medication Sig Dispense Refill  . albuterol (PROVENTIL HFA;VENTOLIN HFA) 108 (90 Base) MCG/ACT inhaler Inhale 2 puffs into the lungs every 6 (six) hours as needed. For wheezing 1 Inhaler 2  . atorvastatin (LIPITOR) 40 MG tablet Take 1 tablet (40 mg total) by mouth daily. 90 tablet 2  . canagliflozin (INVOKANA) 100 MG TABS tablet Take 1 tablet (100 mg total) by mouth daily before breakfast. 90 tablet 3  . Continuous Blood Gluc Sensor (FREESTYLE LIBRE 14 DAY SENSOR) MISC 1 EACH BY DOES NOT APPLY ROUTE EVERY 14 (FOURTEEN) DAYS. CHANGE EVERY 2 WEEKS 2 each 11  . ELIQUIS 5 MG TABS tablet TAKE 2 TABLETS(10 MG) BY MOUTH TWICE DAILY FOR 7 DAYS THEN TAKE 1 TABLET(5 MG) BY MOUTH TWICE DAILY 60 tablet 0  . gabapentin (NEURONTIN) 300 MG capsule Take 1 capsule (300 mg total) by mouth 3 (three) times daily. 270 capsule 3  . Insulin Disposable Pump (V-GO 30) KIT USE 1 TIME A DAY. 90 kit 3  . insulin lispro (HUMALOG) 100 UNIT/ML cartridge INJECT UP TO 80 UNITS DAILY-VGO 30 SYSTEM. 90 mL 11  . metFORMIN (GLUCOPHAGE-XR) 500 MG 24 hr tablet TAKE 4 TABLETS DAILY WITH BREAKFAST 360 tablet 3  . Semaglutide, 1 MG/DOSE, (OZEMPIC, 1 MG/DOSE,) 2 MG/1.5ML SOPN Inject 1 mg into the skin once a week. 6 pen 3   No current facility-administered medications on file prior to visit.   No Known Allergies Family History  Problem Relation Age of  Onset  . Heart disease Mother   . Other Mother        clotting disorders  . Diabetes Mother   . Other Father        sudden death/cervical and lumbar disk disease  . Aneurysm Father        In his 40s smoked  . Hyperlipidemia Father   . Hypertension Other   . Allergies Other   . Deep vein thrombosis Other   . Sleep apnea Other   . Obesity Other     Objective:   Physical Exam BP 110/60   Pulse 78   Ht '5\' 10"'  (1.778 m)   Wt 287 lb (130.2 kg)   SpO2 97%   BMI 41.18 kg/m  Body mass index is 41.18 kg/m. Wt Readings from Last 3 Encounters:  04/27/19 287 lb (130.2 kg)  04/21/19 284 lb (128.8 kg)  02/02/19 288 lb 3.2 oz (130.7 kg)   Constitutional: overweight, in NAD Eyes: PERRLA, EOMI, no exophthalmos ENT: moist mucous membranes, no thyromegaly, no cervical lymphadenopathy Cardiovascular: RRR, No MRG, + R LE edema after DVT (chronic) Respiratory: CTA B Gastrointestinal: abdomen soft, NT, ND, BS+ Musculoskeletal: no deformities, strength intact in all 4 Skin: moist, warm, no rashes Neurological: no tremor with outstretched hands, DTR normal in all 4  Assessment:     1. DM2, insulin-dependent, uncontrolled, with complications - DR  2. Obesity class 3 BMI Classification:  < 18.5 underweight   18.5-24.9 normal weight   25.0-29.9 overweight   30.0-34.9 class I obesity   35.0-39.9 class II obesity   ? 40.0 class III obesity   3. HL  4. ED  Plan:     1. Patient with history of uncontrolled diabetes, on oral antidiabetic  regimen, with Metformin, SGLT2 inhibitor, GLP-1 receptor agonist and VGo patch pump, which improved control at last visit and then HbA1c of 6.7%.  Before last visit, he started to take his medications consistently and to check his sugars frequently with his freestyle libre CGM.  Sugars were at or close to goal so we did not change his regimen at that time. -At this visit, sugars are above target but he does not scan his sensor many times daily  identify trends. It is obvious that his sugars increase that he wakes up in the morning, whenever he eats. He changes his feet in the morning and uses more mealtime insulin that is available in the pump, taking from the basal rates. Therefore, I advised him to change the VGo at night and if the sugars are not the next 2 weeks, we may need to go to VGo40 pump. He will let me know. -I also advised him to scan the sensor at least every 8 hours to avoid gaps in the data. - I advised him to: Patient Instructions  Please continue: - Metformin XR 2000 mg at dinnertime - Invokana 100 mg before b'fast  - Ozempic 1 mg weekly  Please continue: - VGo 30: 6 clicks per meal, but try to change it at night  Let me know if the sugars are not better in 3 weeks.  Scan the sensor at least every 8h.  Please return in 3-4 months with your sugar log.    - we checked his HbA1c: 7.5% (higher) - advised to check sugars at different times of the day - 4x a day, rotating check times - advised for yearly eye exams >> he is UTD - return to clinic in 3-4 months    2. Obesity class 3 -Continue Ozempic and Invokana which should also help with weight loss -At last visit we discussed about stopping sodas -He lost 5 since last visit  3. HL -Reviewed latest lipid panel from 12/2018: All tractions abnormal: LDL above goal Lab Results  Component Value Date   CHOL 203 (H) 12/26/2018   HDL 35.80 (L) 12/26/2018   LDLCALC 107 (H) 07/29/2009   LDLDIRECT 125.0 12/26/2018   TRIG 264.0 (H) 12/26/2018   CHOLHDL 6 12/26/2018  -Continues Lipitor without side effects  4. ED -worsening -Most likely due to obesity + uncontrolled diabetes, despite improvement in his weight and diabetes in the last -will check a testosterone level, but since he complains of feeling cold, will also check a B12 level (which was low normal for him in the past) and also a TSH. -We discussed that if the labs are normal, he may need to seek a second  opinion with urology  Office Visit on 04/27/2019  Component Date Value Ref Range Status  . Testosterone, Serum (Total) 04/27/2019 262* ng/dL Final   Comment: This test was developed and its performance characteristics determined by [facility]. It has not been cleared or approved by the Food and Drug Administration. Reference Range: Adult Males >18 years    32 - 51 This [facility] LC/MS-MS method is currently certified by the [facility] Hormone Standardization Program (HoST).  Adult male reference interval is based on a population of healthy nonobese males (BMI <30) between 12 and 52 years old. Troy [name] [id number] PMID: [id number].   . % Free Testosterone 04/27/2019 1.9  % Final   Comment: This test was developed and its performance characteristics determined by [facility]. It has not been cleared or approved by the Food and  Drug Administration. Reference Range: Adult Males: 1.5 - 3.2   . Free Testosterone, S 04/27/2019 50* pg/mL Final   Comment: Reference Range: Adult Males: 52 - 280   . Sex Hormone Binding Globulin 04/27/2019 34.5  nmol/L Final   Comment: Reference Range: Pubertal: 16.0 - 100.0 20 - 49y: 16.5 - 55.9 >49y:     19.3 - 76.4   . Vitamin B-12 04/27/2019 237  211 - 911 pg/mL Final  . TSH 04/27/2019 1.32  0.35 - 4.50 uIU/mL Final  . Hemoglobin A1C 04/27/2019 7.5* 4.0 - 5.6 % Final   Free Testosterone is slightly low.  For now, I would recommend continuing weight loss and recheck the level at next visit. Vitamin B12 is low normal.  Would suggest 1000 mcg daily. Thyroid test is normal.  Troy Kingdom, MD PhD [facility]

## 2019-04-29 MED ORDER — APIXABAN 5 MG PO TABS: ORAL_TABLET | ORAL | 1 refills | Status: DC

## 2019-05-03 LAB — TESTOSTERONE, FREE AND TOTAL (INCLUDES SHBG)-(MALES)
% Free Testosterone: 1.9 %
Free Testosterone, S: 50 pg/mL — ABNORMAL LOW
Sex Hormone Binding Globulin: 34.5 nmol/L
Testosterone, Serum (Total): 262 ng/dL — ABNORMAL LOW

## 2019-05-11 DIAGNOSIS — H43823 Vitreomacular adhesion, bilateral: Secondary | ICD-10-CM | POA: Diagnosis not present

## 2019-05-11 DIAGNOSIS — E113313 Type 2 diabetes mellitus with moderate nonproliferative diabetic retinopathy with macular edema, bilateral: Secondary | ICD-10-CM | POA: Diagnosis not present

## 2019-05-20 ENCOUNTER — Other Ambulatory Visit: Payer: Self-pay | Admitting: Internal Medicine

## 2019-05-20 DIAGNOSIS — E11319 Type 2 diabetes mellitus with unspecified diabetic retinopathy without macular edema: Secondary | ICD-10-CM

## 2019-05-20 DIAGNOSIS — Z794 Long term (current) use of insulin: Secondary | ICD-10-CM

## 2019-05-29 ENCOUNTER — Ambulatory Visit: Payer: BC Managed Care – PPO | Attending: Internal Medicine

## 2019-05-29 DIAGNOSIS — Z23 Encounter for immunization: Secondary | ICD-10-CM

## 2019-05-29 NOTE — Progress Notes (Signed)
   Covid-19 Vaccination Clinic  Name:  Troy [name]    MRN: [id number] DOB: 02-Jun-1970  05/29/2019  [name] was observed post Covid-19 immunization for 15 minutes without incident. He was provided with Vaccine Information Sheet and instruction to access the V-Safe system.   [name] was instructed to call 911 with any severe reactions post vaccine: [name] Difficulty breathing  . Swelling of face and throat  . A fast heartbeat  . A bad rash all over body  . Dizziness and weakness   Immunizations Administered    Name Date Dose VIS Date Route   Pfizer COVID-19 Vaccine 05/29/2019 10:30 AM 0.3 mL 02/20/2019 Intramuscular   Manufacturer: [place]   Lot: [id number]   [place]: [id number]

## 2019-06-23 ENCOUNTER — Ambulatory Visit: Payer: BC Managed Care – PPO | Attending: Internal Medicine

## 2019-06-23 DIAGNOSIS — Z23 Encounter for immunization: Secondary | ICD-10-CM

## 2019-06-23 NOTE — Progress Notes (Signed)
   Covid-19 Vaccination Clinic  Name:  Troy [name]    MRN: [id number] DOB: [birth year]  06/23/2019  Troy [name] was observed post Covid-19 immunization for 15 minutes without incident. He was provided with Vaccine Information Sheet and instruction to access the V-Safe system.   Troy [name] was instructed to call 911 with any severe reactions post vaccine: [name] Difficulty breathing  . Swelling of face and throat  . A fast heartbeat  . A bad rash all over body  . Dizziness and weakness   Immunizations Administered    Name Date Dose VIS Date Route   Pfizer COVID-19 Vaccine 06/23/2019 12:03 PM 0.3 mL 02/20/2019 Intramuscular   Manufacturer: [place]   Lot: [id number]   [place]: [id number]

## 2019-07-24 ENCOUNTER — Other Ambulatory Visit: Payer: Self-pay

## 2019-07-27 ENCOUNTER — Encounter: Payer: Self-pay | Admitting: Internal Medicine

## 2019-07-27 ENCOUNTER — Other Ambulatory Visit: Payer: Self-pay

## 2019-07-27 ENCOUNTER — Ambulatory Visit (INDEPENDENT_AMBULATORY_CARE_PROVIDER_SITE_OTHER): Payer: BC Managed Care – PPO | Admitting: Internal Medicine

## 2019-07-27 VITALS — BP 128/80 | HR 85 | Ht 70.0 in | Wt 297.0 lb

## 2019-07-27 DIAGNOSIS — E11319 Type 2 diabetes mellitus with unspecified diabetic retinopathy without macular edema: Secondary | ICD-10-CM | POA: Diagnosis not present

## 2019-07-27 DIAGNOSIS — R7989 Other specified abnormal findings of blood chemistry: Secondary | ICD-10-CM

## 2019-07-27 DIAGNOSIS — Z794 Long term (current) use of insulin: Secondary | ICD-10-CM

## 2019-07-27 DIAGNOSIS — E785 Hyperlipidemia, unspecified: Secondary | ICD-10-CM

## 2019-07-27 DIAGNOSIS — Z6841 Body Mass Index (BMI) 40.0 and over, adult: Secondary | ICD-10-CM

## 2019-07-27 LAB — POCT GLYCOSYLATED HEMOGLOBIN (HGB A1C): Hemoglobin A1C: 6.4 % — AB (ref 4.0–5.6)

## 2019-07-27 MED ORDER — INSULIN LISPRO 100 UNIT/ML ~~LOC~~ SOLN: 80.0000 [IU] | Freq: Three times a day (TID) | SUBCUTANEOUS | 3 refills | Status: DC

## 2019-07-27 MED ORDER — V-GO 30 KIT: PACK | 3 refills | Status: DC

## 2019-07-27 NOTE — Patient Instructions (Signed)
Please continue: - Metformin XR 2000 mg at dinnertime - Invokana 100 mg before b'fast  - Ozempic 1 mg weekly - VGo 30: 6 clicks per meal, and change it at night  Please return in 3-4 months with your sugar log.

## 2019-07-27 NOTE — Progress Notes (Signed)
Subjective:     Patient ID: Troy [name], male   DOB: [birth year], 49 y.o.   MRN: [id number]  This visit occurred during the SARS-CoV-2 public health emergency.  Safety protocols were in place, including screening questions prior to the visit, additional usage of staff PPE, and extensive cleaning of exam room while observing appropriate contact time as indicated for disinfecting solutions.   HPI Troy [name] is a pleasant 49 y.o. man, returning for management of DM2, dx 2012, insulin-dependent, with complications (DR), uncontrolled.  Last visit 3 months ago.  Before last visit, sugars started to improve after switching from night shift to day shift.  Reviewed HbA1c levels: Lab Results  Component Value Date   HGBA1C 7.5 (A) 04/27/2019   HGBA1C 6.7 (A) 12/26/2018   HGBA1C 7.3 (A) 09/16/2017   He is on: - Metformin XR 2000 mg in a.m. - Invokana 100 mg before breakfast - VGo 40 >> 30: 2-3 clicks per meal >> 6 clicks per meal - Ozempic 0.5 mg weekly-started 06/2017 >> 1 mg weekly He was on Victoza before >> no SEs.   He checks his sugars more than 4 times a day with his CGM: Freestyle libre CGM parameters: - Average: 160 >> 143 - % active CGM time: 30% >> 60% of the time - Glucose variability 25.5% >> 25.1%(target < or = to 36%) - time in range:  - very low (<54): 0% >> 0% - low (54-69): 0% >> 0% - normal range (70-180): 72% >> 85% - high sugars (181-250): 26% >> 13% - very high sugars (>250): 2% >> 2%   Previously:   Lowest: 113 >> 77 >> 78 >> 105 >> 60s Highest: 300s >> 243 >> 200s >> 285  -No CKD: Lab Results  Component Value Date   BUN 13 02/02/2019   BUN 17 12/26/2018   CREATININE 0.72 02/02/2019   CREATININE 0.79 12/26/2018   No MAU: Lab Results  Component Value Date   MICRALBCREAT 0.5 12/26/2018   MICRALBCREAT 1.4 07/22/2017   MICRALBCREAT 2.2 12/18/2016   MICRALBCREAT 5.8 10/18/2015   MICRALBCREAT 2.0 03/16/2013   MICRALBCREAT 1.6 11/06/2012    MICRALBCREAT 2.5 07/31/2012   MICRALBCREAT 3.6 11/26/2011   MICRALBCREAT 2.3 11/28/2010   MICRALBCREAT 0.6 07/29/2009  He is not on ACE inhibitor/ARB. -+ HL: Lab Results  Component Value Date   CHOL 203 (H) 12/26/2018   HDL 35.80 (L) 12/26/2018   LDLCALC 107 (H) 07/29/2009   LDLDIRECT 125.0 12/26/2018   TRIG 264.0 (H) 12/26/2018   CHOLHDL 6 12/26/2018  On Lipitor 20. - Last eye exam was in 04/2019: + DR.  He sees a retina specialist.. -No numbness and tingling in feet - sees podiatry.  He is on gabapentin.  He has OSA and is compliant with his CPAP.  He had a DVT in R leg in 04/2015.  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  He noticed that sugars were higher after he started Eliquis. He lost 45 lbs before 2017 - mostly vegan food.   He has a history of ED. He did see urology before and was given Viagra, but causes for ED were not explored. At last visit, per his request, we checked his testosterone level.  This returned mildly low: Component     Latest Ref Rng & Units 04/27/2019  Testosterone, Serum (Total)     ng/dL 262 (L)  % Free Testosterone     % 1.9  Free  Testosterone, S     pg/mL 50 (L)  Sex Hormone Binding Globulin     nmol/L 34.5   Also, we checked his B12 level and this was at the lower end of the normal interval: Lab Results  Component Value Date   VITAMINB12 237 04/27/2019   VITAMINB12 236 08/19/2017  I advised him to start B12 1000 mcg daily.  Review of Systems Constitutional: + weight gain/no weight loss, no fatigue, no subjective hyperthermia, no subjective hypothermia Eyes: no blurry vision, no xerophthalmia ENT: no sore throat, no nodules palpated in neck, no dysphagia, no odynophagia, no hoarseness Cardiovascular: no CP/no SOB/no palpitations/+ R leg leg swelling Respiratory: no cough/no SOB/no wheezing Gastrointestinal: no N/no V/no D/no C/no acid reflux Musculoskeletal: + muscle aches (Right leg)/no joint  aches Skin: + Chronic rash on her right shin, no hair loss Neurological: no tremors/no numbness/no tingling/no dizziness  I reviewed pt's medications, allergies, PMH, social hx, family hx, and changes were documented in the history of present illness. Otherwise, unchanged from my initial visit note.  Past Medical History:  Diagnosis Date  . Arm weakness    right   . Asthma   . Diabetes insipidus ([place])   . Diabetes mellitus without complication ([place])   . DVT (deep venous thrombosis) ([place])   . Obesity   . OSA (obstructive sleep apnea)    Past Surgical History:  Procedure Laterality Date  . BACK SURGERY    . CERVICAL DISC SURGERY     C6/C7 2009  . cubital tunnel left arm     2003  . ELBOW SURGERY    . FIBULA FRACTURE SURGERY     plate & pin removed due to infection 1997  . ULNAR NERVE REPAIR     Social History   Socioeconomic History  . Marital status: Married    Spouse name: Not on file  . Number of children: 1  . Years of education: Not on file  . Highest education level: Not on file  Occupational History  . Occupation: Teacher, early years/pre  Tobacco Use  . Smoking status: Never Smoker  . Smokeless tobacco: Never Used  Substance and Sexual Activity  . Alcohol use: Yes    Comment: 1 drink mo.  . Drug use: No  . Sexual activity: Yes  Other Topics Concern  . Not on file  Social History Narrative   Occupation:  days Time Troy [name] 11- 8 am  Now changed job and shift to Troy [name] through Thursday    Working  Ft 40 hours mostly   Married _0 Wife s/p bariatric surgery pt   Regular exercise- yes   Pt does have children   Father died suddenly 2008/05/25   Mom passes 2017  Acute rep disease after acute renal failure   Daily caffeine use one a day   2 dogs and cat.             Social Determinants of Health   Financial Resource Strain:   . Difficulty of Paying Living Expenses:   Food Insecurity:   . Worried About Charity fundraiser in the Last Year:   .  Arboriculturist in the Last Year:   Transportation Needs:   . Film/video editor (Medical):   Troy [name] Lack of Transportation (Non-Medical):   Physical Activity:   . Days of Exercise per Week:   . Minutes of Exercise per Session:   Stress:   . Feeling of  Stress :   Social Connections:   . Frequency of Communication with Friends and Family:   . Frequency of Social Gatherings with Friends and Family:   . Attends Religious Services:   . Active Member of Clubs or Organizations:   . Attends Archivist Meetings:   Troy [name] Marital Status:   Intimate Partner Violence:   . Fear of Current or Ex-Partner:   . Emotionally Abused:   Troy [name] Physically Abused:   . Sexually Abused:    Current Outpatient Medications on File Prior to Visit  Medication Sig Dispense Refill  . albuterol (PROVENTIL HFA;VENTOLIN HFA) 108 (90 Base) MCG/ACT inhaler Inhale 2 puffs into the lungs every 6 (six) hours as needed. For wheezing 1 Inhaler 2  . apixaban (ELIQUIS) 5 MG TABS tablet TAKE 1 TABLET(5 MG) BY MOUTH TWICE DAILY 180 tablet 1  . atorvastatin (LIPITOR) 40 MG tablet Take 1 tablet (40 mg total) by mouth daily. 90 tablet 2  . Continuous Blood Gluc Sensor (FREESTYLE LIBRE 14 DAY SENSOR) MISC 1 EACH BY DOES NOT APPLY ROUTE EVERY 14 (FOURTEEN) DAYS. CHANGE EVERY 2 WEEKS 2 each 11  . gabapentin (NEURONTIN) 300 MG capsule Take 1 capsule (300 mg total) by mouth 3 (three) times daily. 270 capsule 3  . Insulin Disposable Pump (V-GO 30) KIT USE 1 TIME A DAY. 90 kit 3  . insulin lispro (HUMALOG) 100 UNIT/ML cartridge INJECT UP TO 80 UNITS DAILY-VGO 30 SYSTEM. 90 mL 11  . INVOKANA 100 MG TABS tablet TAKE 1 TABLET DAILY BEFORE BREAKFAST 90 tablet 3  . metFORMIN (GLUCOPHAGE-XR) 500 MG 24 hr tablet TAKE 4 TABLETS DAILY WITH BREAKFAST 360 tablet 3  . Semaglutide, 1 MG/DOSE, (OZEMPIC, 1 MG/DOSE,) 2 MG/1.5ML SOPN Inject 1 mg into the skin once a week. 6 pen 3   No current facility-administered medications on file prior to visit.    No Known Allergies Family History  Problem Relation Age of Onset  . Heart disease Mother   . Other Mother        clotting disorders  . Diabetes Mother   . Other Father        sudden death/cervical and lumbar disk disease  . Aneurysm Father        In his 55s smoked  . Hyperlipidemia Father   . Hypertension Other   . Allergies Other   . Deep vein thrombosis Other   . Sleep apnea Other   . Obesity Other     Objective:   Physical Exam BP 128/80   Pulse 85   Ht _0  (1.778 m)   Wt 297 lb (134.7 kg)   SpO2 98%   BMI 42.62 kg/m  Body mass index is 42.62 kg/m. Wt Readings from Last 3 Encounters:  07/27/19 297 lb (134.7 kg)  04/27/19 287 lb (130.2 kg)  04/21/19 284 lb (128.8 kg)   Constitutional: overweight, in NAD Eyes: PERRLA, EOMI, no exophthalmos ENT: moist mucous membranes, no thyromegaly, no cervical lymphadenopathy Cardiovascular: RRR, No MRG, + RLE after DVT (chronic) Respiratory: CTA B Gastrointestinal: abdomen soft, NT, ND, BS+ Musculoskeletal: no deformities, strength intact in all 4 Skin: moist, warm, no rashes Neurological: no tremor with outstretched hands, DTR normal in all 4  Assessment:     1. DM2, insulin-dependent, uncontrolled, with complications - DR  2. Obesity class 3 BMI Classification:  < 18.5 underweight   18.5-24.9 normal weight   25.0-29.9 overweight   30.0-34.9 class I obesity   35.0-39.9 class II obesity   ?  40.0 class III obesity   3. HL  4.  Low T  Plan:     1. Patient with history of uncontrolled diabetes, on oral antidiabetic regimen with Metformin ER, SGLT 2 user, GLP-1 receptor agonist and V-Go patch pump, with worse control at last visit after his episode of DVT and starting back on Eliquis.  At that time, he was also running out of the insulin in the pump and we discussed about changing it every night as she was having high blood sugars in the morning.  Also, he had gaps in the CGM data and we discussed about  trying to scan the sensor at least every 8 hours to avoid these gaps.  We did not change his regimen otherwise.   -At this visit, sugars are much better controlled than before per review of his CGM tracings.  He is now setting an alarm to change his VGo pods and this helped significantly.  In the last week, he is using an older insulin that he feels that this is why his sugars are higher.  His sugars are usually in the 90s to 100 in the morning and lower than 200 after meals.  We will continue the current regimen for now but I did send a new prescription for insulin to his pharmacy. - I advised him to: Patient Instructions  Please continue: - Metformin XR 2000 mg at dinnertime - Invokana 100 mg before b'fast  - Ozempic 1 mg weekly - VGo 30: 6 clicks per meal, and change it at night  Please return in 3-4 months with your sugar log.    - we checked his HbA1c: 6.4% (better) - advised to check sugars at different times of the day - 4x a day, rotating check times - advised for yearly eye exams >> he is UTD - return to clinic in 3-4 months    2. Obesity class 3 -Continue Ozempic and Invokana, which should also help with weight loss -He lost 5 pounds before last visit, but gained 13 pounds since then  3. HL -Reviewed latest lipid panel from 12/2018: LDL above goal, as are his triglycerides.  HDL low: Lab Results  Component Value Date   CHOL 203 (H) 12/26/2018   HDL 35.80 (L) 12/26/2018   LDLCALC 107 (H) 07/29/2009   LDLDIRECT 125.0 12/26/2018   TRIG 264.0 (H) 12/26/2018   CHOLHDL 6 12/26/2018  -Continues Lipitor without side effects  4. Low testosterone -Patient had a testosterone checked due to complaints of ED -Free testosterone was slightly low.  This does not align very well with his ED, that he mentioned started almost overnight 2 years ago. He already saw urology in the past, but we discussed that he may need to seek a second opinion, may be at an academic center -I explained that  this could be related obesity and possibly also uncontrolled diabetes.  We discussed about the importance of losing weight and he agrees with a referral to weight management center, to which his wife also knows  Philemon Kingdom, MD PhD [facility]

## 2019-07-27 NOTE — Addendum Note (Signed)
Addended by: Cardell Peach I on: 07/27/2019 11:05 AM   Modules accepted: Orders

## 2019-07-29 ENCOUNTER — Telehealth: Payer: Self-pay

## 2019-07-29 NOTE — Telephone Encounter (Signed)
Received a fax from [place] that Eliquis 5 mg tablets is not covered by insurance. They sent in a fax with alternatives. Placed in red folder for review.  Key # [id number] To start prior authorization if necessary.

## 2019-07-29 NOTE — Telephone Encounter (Signed)
inform patient of insurance coverage  Denial   Can change to xarelto ( rivaroxiban) 20 mg take 1 po qd  Disp 30 or 90  Days refill x 6 months  guess will still need PA  And there may be  Company coupons   On line

## 2019-07-30 ENCOUNTER — Other Ambulatory Visit: Payer: Self-pay

## 2019-07-30 MED ORDER — RIVAROXABAN 20 MG PO TABS: 20.0000 mg | ORAL_TABLET | Freq: Every day | ORAL | 1 refills | Status: DC

## 2019-07-30 NOTE — Telephone Encounter (Signed)
Called patient and gave him the message from Dr. Regis Bill. Patient verbalized an understanding and stated that 90 days is best for his insurance. Xarelto has been sent to his pharmacy for 90 days. Patient will call back if he has any difficulty or questions.

## 2019-08-07 ENCOUNTER — Telehealth: Payer: Self-pay

## 2019-08-07 ENCOUNTER — Other Ambulatory Visit: Payer: Self-pay

## 2019-08-07 MED ORDER — APIXABAN 5 MG PO TABS: ORAL_TABLET | ORAL | 1 refills | Status: DC

## 2019-08-07 NOTE — Telephone Encounter (Signed)
PA for Eliquis sent to cover my meds.   Key: [id number]

## 2019-08-12 NOTE — Telephone Encounter (Signed)
I checked Cover my meds and it stated that this PA has been canceled.  I called patient to confirm his insurance and everything is still correct. Resubmitted PA New Key Number: [id number] Case ID # [id number] Start 07/13/2019 End 08/11/2020  Dx: 1) [id number] Dx: 2) [phone]  Called patient and gave him the message that this was approved and he can check with pharmacy to get his refill as this was sent in on 08/07/2019. Patient verbalized an understanding.

## 2019-09-04 ENCOUNTER — Other Ambulatory Visit: Payer: Self-pay | Admitting: Internal Medicine

## 2019-09-07 ENCOUNTER — Encounter: Payer: Self-pay | Admitting: Internal Medicine

## 2019-09-07 ENCOUNTER — Telehealth: Payer: Self-pay

## 2019-09-07 ENCOUNTER — Ambulatory Visit (INDEPENDENT_AMBULATORY_CARE_PROVIDER_SITE_OTHER): Payer: BC Managed Care – PPO | Admitting: Bariatrics

## 2019-09-07 ENCOUNTER — Other Ambulatory Visit: Payer: Self-pay

## 2019-09-07 ENCOUNTER — Ambulatory Visit (INDEPENDENT_AMBULATORY_CARE_PROVIDER_SITE_OTHER): Payer: BC Managed Care – PPO | Admitting: Internal Medicine

## 2019-09-07 ENCOUNTER — Encounter (INDEPENDENT_AMBULATORY_CARE_PROVIDER_SITE_OTHER): Payer: Self-pay | Admitting: Bariatrics

## 2019-09-07 VITALS — BP 108/71 | HR 75 | Temp 97.9°F | Ht 71.0 in | Wt 293.0 lb

## 2019-09-07 VITALS — BP 100/60 | HR 61 | Temp 98.1°F | Ht 71.0 in | Wt 298.2 lb

## 2019-09-07 DIAGNOSIS — E1169 Type 2 diabetes mellitus with other specified complication: Secondary | ICD-10-CM | POA: Diagnosis not present

## 2019-09-07 DIAGNOSIS — Z9189 Other specified personal risk factors, not elsewhere classified: Secondary | ICD-10-CM

## 2019-09-07 DIAGNOSIS — G4739 Other sleep apnea: Secondary | ICD-10-CM | POA: Diagnosis not present

## 2019-09-07 DIAGNOSIS — Z0289 Encounter for other administrative examinations: Secondary | ICD-10-CM

## 2019-09-07 DIAGNOSIS — R0602 Shortness of breath: Secondary | ICD-10-CM | POA: Diagnosis not present

## 2019-09-07 DIAGNOSIS — Z8249 Family history of ischemic heart disease and other diseases of the circulatory system: Secondary | ICD-10-CM

## 2019-09-07 DIAGNOSIS — E785 Hyperlipidemia, unspecified: Secondary | ICD-10-CM

## 2019-09-07 DIAGNOSIS — Z1331 Encounter for screening for depression: Secondary | ICD-10-CM

## 2019-09-07 DIAGNOSIS — Z6841 Body Mass Index (BMI) 40.0 and over, adult: Secondary | ICD-10-CM

## 2019-09-07 DIAGNOSIS — M79604 Pain in right leg: Secondary | ICD-10-CM | POA: Diagnosis not present

## 2019-09-07 DIAGNOSIS — E119 Type 2 diabetes mellitus without complications: Secondary | ICD-10-CM | POA: Diagnosis not present

## 2019-09-07 DIAGNOSIS — R5383 Other fatigue: Secondary | ICD-10-CM | POA: Diagnosis not present

## 2019-09-07 DIAGNOSIS — I82401 Acute embolism and thrombosis of unspecified deep veins of right lower extremity: Secondary | ICD-10-CM | POA: Diagnosis not present

## 2019-09-07 DIAGNOSIS — Z794 Long term (current) use of insulin: Secondary | ICD-10-CM

## 2019-09-07 DIAGNOSIS — Z86718 Personal history of other venous thrombosis and embolism: Secondary | ICD-10-CM

## 2019-09-07 MED ORDER — BACLOFEN 10 MG PO TABS: 10.0000 mg | ORAL_TABLET | Freq: Three times a day (TID) | ORAL | 1 refills | Status: DC

## 2019-09-07 NOTE — Progress Notes (Signed)
Dear Dr. Philemon Kingdom,   Thank you for referring Troy [name] to our clinic. The following note includes my evaluation and treatment recommendations.  Chief Complaint:   OBESITY Troy [name] (MR# [id number]) is a 49 y.o. male who presents for evaluation and treatment of obesity and related comorbidities. Current BMI is Body mass index is 40.87 kg/m.Troy [name] has been struggling with his weight for many years and has been unsuccessful in either losing weight, maintaining weight loss, or reaching his healthy weight goal.  Troy [name] is currently in the action stage of change and ready to dedicate time achieving and maintaining a healthier weight. Troy [name] is interested in becoming our patient and working on intensive lifestyle modifications including (but not limited to) diet and exercise for weight loss.  Troy [name] does like to cook, but notes time as an obstacle.  He states he snacks at night. His highest weight was 415 lbs around 2004.  Troy [name]'s habits were reviewed today and are as follows: His family eats meals together, he thinks his family will eat healthier with him, he struggles with family and or coworkers weight loss sabotage, his desired weight loss is 93 lbs, he has been heavy most of his life, he started gaining weight at the age of 52, his heaviest weight ever was 415 pounds, he craves seafood and tomatoes, he snacks frequently in the evenings, he is frequently drinking liquids with calories, he frequently makes poor food choices, he has binge eating behaviors and he struggles with emotional eating.  Depression Screen Troy [name]'s Food and Mood (modified PHQ-9) score was 4.  Depression screen [facility] 2/9 09/07/2019  Decreased Interest 0  Down, Depressed, Hopeless 0  PHQ - 2 Score 0  Altered sleeping 0  Tired, decreased energy 1  Change in appetite 1  Feeling bad or failure about yourself  1  Trouble concentrating 1  Moving slowly or fidgety/restless 0  Suicidal thoughts 0    PHQ-9 Score 4  Difficult doing work/chores Not difficult at all   Subjective:   Other fatigue. Troy [name] denies daytime somnolence and denies waking up still tired. Troy [name] generally gets 6-7 hours of sleep per night, and states that he has generally restful sleep. Snoring is present without CPAP. Apneic episodes are not present. Epworth Sleepiness Score is 6.  SOB (shortness of breath) on exertion. Osten notes increasing shortness of breath with certain activities and seems to be worsening over time with weight gain. He notes getting out of breath sooner with activity than he used to. This has not gotten worse recently. Troy [name] denies shortness of breath at rest or orthopnea.  Type 2 diabetes mellitus without complication, with long-term current use of insulin ([place]). Troy [name] sees an endocrinologist. He is taking Ozempic, Invokana, and metformin XR. He does have an insulin pump (Humalog).  Lab Results  Component Value Date   HGBA1C 6.4 (A) 07/27/2019   HGBA1C 7.5 (A) 04/27/2019   HGBA1C 6.7 (A) 12/26/2018   Lab Results  Component Value Date   MICROALBUR 0.8 12/26/2018   LDLCALC 107 (H) 07/29/2009   CREATININE 0.72 02/02/2019   No results found for: INSULIN  Other sleep apnea. Derl wears CPAP and reports restful sleep.  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 He is taking Eliquis. He has edema in his right leg.  Hyperlipidemia associated with type 2 diabetes mellitus ([place]). [name] is taking atorvastatin.   Lab Results  Component  Value Date   CHOL 203 (H) 12/26/2018   HDL 35.80 (L) 12/26/2018   LDLCALC 107 (H) 07/29/2009   LDLDIRECT 125.0 12/26/2018   TRIG 264.0 (H) 12/26/2018   CHOLHDL 6 12/26/2018   Lab Results  Component Value Date   ALT 16 02/02/2019   AST 12 02/02/2019   ALKPHOS 50 02/02/2019   BILITOT 0.6 02/02/2019   The 10-year ASCVD risk score Troy [name] DC Jr., et al., 2013) is: 6.5%   Values  used to calculate the score:     Age: 67 years     Sex: Male     Is Non-Hispanic African American: No     Diabetic: Yes     Tobacco smoker: No     Systolic Blood Pressure: 381 mmHg     Is BP treated: No     HDL Cholesterol: 35.8 mg/dL     Total Cholesterol: 203 mg/dL  Depression screening. Kortney had a negative depression screen with a PHQ-9 score of 4.  At risk for activity intolerance. Adarryl is at risk of exercise intolerance due to history of DVT, obesity, fatigue and shortness of breath.  Assessment/Plan:   Other fatigue. Troy [name] does feel that his weight is causing his energy to be lower than it should be. Fatigue may be related to obesity, depression or many other causes. Labs will be ordered, and in the meanwhile, Troy [name] will focus on self care including making healthy food choices, increasing physical activity and focusing on stress reduction. EKG 12-Lead, Comprehensive metabolic panel, VITAMIN D 25 Hydroxy (Vit-D Deficiency, Fractures) testing ordered today.  SOB (shortness of breath) on exertion. Troy [name] does not feel that he gets out of breath more easily that he used to when he exercises. Troy [name]'s shortness of breath appears to be obesity related and exercise induced. He has agreed to work on weight loss and gradually increase exercise to treat his exercise induced shortness of breath. Will continue to monitor closely. Comprehensive metabolic panel ordered today.  Type 2 diabetes mellitus without complication, with long-term current use of insulin (Troy [name]). Good blood sugar control is important to decrease the likelihood of diabetic complications such as nephropathy, neuropathy, limb loss, blindness, coronary artery disease, and death. Intensive lifestyle modification including diet, exercise and weight loss are the first line of treatment for diabetes. Troy [name] will follow-up with his endocrinologist as scheduled and continue his medications as directed.  Other sleep apnea.  Intensive lifestyle modifications are the first line treatment for this issue. We discussed several lifestyle modifications today and he will continue to work on diet, exercise and weight loss efforts. We will continue to monitor. Orders and follow up as documented in patient record. Nakia will continue to use CPAP as directed.  Counseling  Sleep apnea is a condition in which breathing pauses or becomes shallow during sleep. This happens over and over during the night. This disrupts your sleep and keeps your body from getting the rest that it needs, which can cause tiredness and lack of energy (fatigue) during the day.  Sleep apnea treatment: If you were given a device to open your airway while you sleep, USE IT!  Sleep hygiene:   Limit or avoid alcohol, caffeinated beverages, and cigarettes, especially close to bedtime.   Do not eat a large meal or eat spicy foods right before bedtime. This can lead to digestive discomfort that can make it hard for you to sleep.  Keep a sleep diary to help you and your health care provider figure out what  could be causing your insomnia.  . Make your bedroom a dark, comfortable place where it is easy to fall asleep. ? Put up shades or blackout curtains to block light from outside. ? Use a white noise machine to block noise. ? Keep the temperature cool. . Limit screen use before bedtime. This includes: ? Watching TV. ? Using your smartphone, tablet, or computer. . Stick to a routine that includes going to bed and waking up at the same times every day and night. This can help you fall asleep faster. Consider making a quiet activity, such as reading, part of your nighttime routine. . Try to avoid taking naps during the day so that you sleep better at night. . Get out of bed if you are still awake after 15 minutes of trying to sleep. Keep the lights down, but try reading or doing a quiet activity. When you feel sleepy, go back to bed.  History of DVT of lower  extremity, right leg. Haston will follow-up with his PCP and specialist as scheduled.  Hyperlipidemia associated with type 2 diabetes mellitus ([place]). Cardiovascular risk and specific lipid/LDL goals reviewed.  We discussed several lifestyle modifications today and [name] will continue to work on diet, exercise and weight loss efforts. Orders and follow up as documented in patient record. Antwaine will continue his medication as directed. Lipid Panel With LDL/HDL Ratio ordered today.  Counseling Intensive lifestyle modifications are the first line treatment for this issue. . Dietary changes: Increase soluble fiber. Decrease simple carbohydrates. . Exercise changes: Moderate to vigorous-intensity aerobic activity 150 minutes per week if tolerated. . Lipid-lowering medications: see documented in medical record.   Depression screening. Dathan had a negative depression screen.  At risk for activity intolerance. Kayven was given approximately 15 minutes of exercise intolerance counseling today. He is 49 y.o. male and has risk factors exercise intolerance including obesity. We discussed intensive lifestyle modifications today with an emphasis on specific weight loss instructions and strategies. Reiley will slowly increase activity as tolerated.  Repetitive spaced learning was employed today to elicit superior memory formation and behavioral change.  Class 3 severe obesity with serious comorbidity and body mass index (BMI) of 40.0 to 44.9 in adult, unspecified obesity type ([place]).  Kinta is currently in the action stage of change and his goal is to continue with weight loss efforts. I recommend Moosa begin the structured treatment plan as follows:  He has agreed to the Category 3 Plan.  He will work on meal planning, intentional eating, and will keep snacks out of touch.  Exercise goals: Kaitlyn will use small weights and resistance bands for exercise (handout given).   Behavioral  modification strategies: increasing lean protein intake, decreasing simple carbohydrates, increasing vegetables, increasing water intake, decreasing eating out, no skipping meals, meal planning and cooking strategies, keeping healthy foods in the home and planning for success.  He was informed of the importance of frequent follow-up visits to maximize his success with intensive lifestyle modifications for his multiple health conditions. He was informed we would discuss his lab results at his next visit unless there is a critical issue that needs to be addressed sooner. Ardon agreed to keep his next visit at the agreed upon time to discuss these results.  Objective:   Blood pressure 108/71, pulse 75, temperature 97.9 F (36.6 C), height 5\' 11"  (1.803 m), weight 293 lb (132.9 kg), SpO2 98 %. Body mass index is 40.87 kg/m.  EKG: Normal sinus rhythm, rate 80 BPM. Within normal  limits.  Indirect Calorimeter completed today shows a VO2 of 308 and a REE of 2147.  His calculated basal metabolic rate is 3382 thus his basal metabolic rate is worse than expected.  General: Cooperative, alert, well developed, in no acute distress. HEENT: Conjunctivae and lids unremarkable. Cardiovascular: Regular rhythm.  Lungs: Normal work of breathing. Neurologic: No focal deficits.  Extremities: Pt is wearing support hose on the right leg.  Lab Results  Component Value Date   CREATININE 0.72 02/02/2019   BUN 13 02/02/2019   NA 139 02/02/2019   K 4.2 02/02/2019   CL 102 02/02/2019   CO2 27 02/02/2019   Lab Results  Component Value Date   ALT 16 02/02/2019   AST 12 02/02/2019   ALKPHOS 50 02/02/2019   BILITOT 0.6 02/02/2019   Lab Results  Component Value Date   HGBA1C 6.4 (A) 07/27/2019   HGBA1C 7.5 (A) 04/27/2019   HGBA1C 6.7 (A) 12/26/2018   HGBA1C 7.3 (A) 09/16/2017   HGBA1C 8.7 06/10/2017   No results found for: INSULIN Lab Results  Component Value Date   TSH 1.32 04/27/2019   Lab Results   Component Value Date   CHOL 203 (H) 12/26/2018   HDL 35.80 (L) 12/26/2018   LDLCALC 107 (H) 07/29/2009   LDLDIRECT 125.0 12/26/2018   TRIG 264.0 (H) 12/26/2018   CHOLHDL 6 12/26/2018   Lab Results  Component Value Date   WBC 8.7 02/02/2019   HGB 16.0 02/02/2019   HCT 46.8 02/02/2019   MCV 94.8 02/02/2019   PLT 233.0 02/02/2019   Lab Results  Component Value Date   FERRITIN 141.3 07/22/2017   Attestation Statements:   Reviewed by clinician on day of visit: allergies, medications, problem list, medical history, surgical history, family history, social history, and previous encounter notes.  Migdalia Dk, am acting as Location manager for [employer], DO   I have reviewed the above documentation for accuracy and completeness, and I agree with the above. Jearld Lesch, DO

## 2019-09-07 NOTE — Progress Notes (Signed)
 Chief Complaint  Patient presents with  . Leg Swelling    DVT in right leg, 3rd dvt and pain is worse and swelling is not going down    HPI: Troy [name] 49 y.o. come in for    Ongoing  Increasing  Pain right lower  Leg post  And upt to calf  That is not tender to touch but feels" like a tight sausage"  And pain only relieved if   Very high elevation  Short term .  Not burning   Where gabapentin helps feet.  No new swelling   Affecting work  And had to take off some time from pain .    Has seen vascular a few months ago and fetl to have chronic dvt.   And no new  Plans  But anticoaguation,  Taking  eliqus  No bleeding   No falling no bleeding  Dm in contrl at this t ime  Gabapentin doesn't help  this pain   Taking b12 also  Oral   Needs updated  Form for c spine neck   needs new fmla for  Leg pain   ROS: See pertinent positives and negatives per HPI.  Past Medical History:  Diagnosis Date  . Arm weakness    right   . Asthma   . B12 deficiency   . Back pain   . Diabetes insipidus ([name])   . Diabetes mellitus without complication ([name])   . DVT (deep venous thrombosis) ([name])   . Leg edema, right   . Male infertility   . Obesity   . Obesity   . OSA (obstructive sleep apnea)     Family History  Problem Relation Age of Onset  . Heart disease Mother   . Other Mother        clotting disorders  . Diabetes Mother   . High blood pressure Mother   . Kidney disease Mother   . Depression Mother   . Sleep apnea Mother   . Obesity Mother   . Other Father        sudden death/cervical and lumbar disk disease  . Aneurysm Father        In his 50s smoked  . Hyperlipidemia Father   . High blood pressure Father   . Sudden death Father   . Alcoholism Father   . Hypertension Other   . Allergies Other   . Deep vein thrombosis Other   . Sleep apnea Other   . Obesity Other     Social History   Socioeconomic History  . Marital status: Married    Spouse name: Kathy  .  Number of children: 1  . Years of education: Not on file  . Highest education level: Not on file  Occupational History  . Occupation: Customer Service Lead  Tobacco Use  . Smoking status: Never Smoker  . Smokeless tobacco: Never Used  Vaping Use  . Vaping Use: Never used  Substance and Sexual Activity  . Alcohol use: Yes    Comment: 1 drink mo.  . Drug use: No  . Sexual activity: Yes  Other Topics Concern  . Not on file  Social History Narrative   Occupation:  days [employer]   Worked 11- 8 am  Now changed job and shift to 9-8 Mon through Thursday    Working  Ft 40 hours mostly   Married 10 10 10   Wife s/p bariatric surgery pt   Regular exercise- yes     Pt does have children   Father died suddenly May 18, 2008   Mom passes 2017  Acute rep disease after acute renal failure   Daily caffeine use one a day   2 dogs and cat.             Social Determinants of Health   Financial Resource Strain:   . Difficulty of Paying Living Expenses:   Food Insecurity:   . Worried About Charity fundraiser in the Last Year:   . Arboriculturist in the Last Year:   Transportation Needs:   . Film/video editor (Medical):   [name] Lack of Transportation (Non-Medical):   Physical Activity:   . Days of Exercise per Week:   . Minutes of Exercise per Session:   Stress:   . Feeling of Stress :   Social Connections:   . Frequency of Communication with Friends and Family:   . Frequency of Social Gatherings with Friends and Family:   . Attends Religious Services:   . Active Member of Clubs or Organizations:   . Attends Archivist Meetings:   [name] Marital Status:     Outpatient Medications Prior to Visit  Medication Sig Dispense Refill  . albuterol (PROVENTIL HFA;VENTOLIN HFA) 108 (90 Base) MCG/ACT inhaler Inhale 2 puffs into the lungs every 6 (six) hours as needed. For wheezing 1 Inhaler 2  . apixaban (ELIQUIS) 5 MG TABS tablet TAKE 1 TABLET(5 MG) BY MOUTH TWICE DAILY 180 tablet 1  .  atorvastatin (LIPITOR) 40 MG tablet Take 1 tablet (40 mg total) by mouth daily. 90 tablet 2  . Continuous Blood Gluc Sensor (FREESTYLE LIBRE 14 DAY SENSOR) MISC 1 EACH BY DOES NOT APPLY ROUTE EVERY 14 (FOURTEEN) DAYS. CHANGE EVERY 2 WEEKS 2 each 11  . gabapentin (NEURONTIN) 300 MG capsule Take 1 capsule (300 mg total) by mouth 3 (three) times daily. 270 capsule 3  . Insulin Disposable Pump (V-GO 30) KIT USE 1 TIME A DAY. 90 kit 3  . insulin lispro (HUMALOG) 100 UNIT/ML injection Inject 0.8 mLs (80 Units total) into the skin 3 (three) times daily before meals. 30 mL 3  . INVOKANA 100 MG TABS tablet TAKE 1 TABLET DAILY BEFORE BREAKFAST 90 tablet 3  . metFORMIN (GLUCOPHAGE-XR) 500 MG 24 hr tablet TAKE 4 TABLETS DAILY WITH BREAKFAST 360 tablet 3  . OZEMPIC, 1 MG/DOSE, 2 MG/1.5ML SOPN INJECT 1 MG UNDER THE SKIN ONCE A WEEK 9 mL 2  . rivaroxaban (XARELTO) 20 MG TABS tablet Take 1 tablet (20 mg total) by mouth daily with supper. 90 tablet 1   No facility-administered medications prior to visit.     EXAM:  BP 100/60   Pulse 61   Temp 98.1 F (36.7 C) (Temporal)   Ht 5' 11" (1.803 m)   Wt 298 lb 3.2 oz (135.3 kg)   SpO2 98%   BMI 41.59 kg/m   Body mass index is 41.59 kg/m.  GENERAL: vitals reviewed and listed above, alert, oriented, appears well hydrated and in no acute distress HEENT: atraumatic, conjunctiva  clear, no obvious abnormalities on inspection of external nose and ears OP : masked   NECK: no obvious masses on inspection stiff  CV: HRRR, no clubbing cyanosis or  peripheral edema nl cap refill  MS: moves all extremities without noticeable focal  abnormality PSYCH: pleasant and cooperative, no obvious depression or anxiety Lab Results  Component Value Date   WBC 8.7 02/02/2019   HGB 16.0 02/02/2019  HCT 46.8 02/02/2019   PLT 233.0 02/02/2019   GLUCOSE 165 (H) 02/02/2019   CHOL 203 (H) 12/26/2018   TRIG 264.0 (H) 12/26/2018   HDL 35.80 (L) 12/26/2018   LDLDIRECT 125.0  12/26/2018   LDLCALC 107 (H) 07/29/2009   ALT 16 02/02/2019   AST 12 02/02/2019   NA 139 02/02/2019   K 4.2 02/02/2019   CL 102 02/02/2019   CREATININE 0.72 02/02/2019   BUN 13 02/02/2019   CO2 27 02/02/2019   TSH 1.32 04/27/2019   HGBA1C 6.4 (A) 07/27/2019   MICROALBUR 0.8 12/26/2018   BP Readings from Last 3 Encounters:  09/07/19 100/60  09/07/19 108/71  07/27/19 128/80    ASSESSMENT AND PLAN:  Discussed the following assessment and plan:  Right leg pain - Plan: Ambulatory referral to Sports Medicine  Morbid obesity ([place])  DVT, lower extremity, recurrent, right ([place])  Family history of DVT   Uncertain why  So much pain  Had vascular study when saw vascular   In spring    consdier   Seeing sm  ? Other cause of pain  Rule out? Also consider   Other anticoagulants   ?  hematol consult would lovenox or other ido better    Dm is controlled   This doesn't  Seem like  Neuropathy pain   Hs fmla forms  Will   Address as needed   Can try baclofen in interim  Prolonged time  2 forms  On disability and fmla   and record review   -Patient advised to return or notify health care team  if  new concerns arise.  Patient Instructions   Not sure why or if pain from something else besides the chronic dvt.   Consider other anticoagulant   ?Seeing hematology  Other rx  For the   Clotting .   Can try  baclofen  Has been used for nerve and muscle pain .    WIll  Arrange referral     [name]. Basha Krygier M.D.

## 2019-09-07 NOTE — Patient Instructions (Addendum)
  Not sure why or if pain from something else besides the chronic dvt.   Consider other anticoagulant   ?Seeing hematology  Other rx  For the   Clotting .   Can try  baclofen  Has been used for nerve and muscle pain .    WIll  Arrange referral

## 2019-09-07 NOTE — Telephone Encounter (Signed)
During patient visit today patient presented paperwork for his employment to be completed and faxed to number provided and completed paperwork sent to his MyChart. There are 2 question marks by the questions he was not able to answer.  Also additional paperwork was faxed to [name] to complete also.  Placed in red folder. Please return once complete.

## 2019-09-08 ENCOUNTER — Encounter (INDEPENDENT_AMBULATORY_CARE_PROVIDER_SITE_OTHER): Payer: Self-pay | Admitting: Bariatrics

## 2019-09-08 DIAGNOSIS — E786 Lipoprotein deficiency: Secondary | ICD-10-CM | POA: Insufficient documentation

## 2019-09-08 DIAGNOSIS — E559 Vitamin D deficiency, unspecified: Secondary | ICD-10-CM | POA: Insufficient documentation

## 2019-09-08 LAB — COMPREHENSIVE METABOLIC PANEL
ALT: 24 IU/L (ref 0–44)
AST: 14 IU/L (ref 0–40)
Albumin/Globulin Ratio: 1.7 (ref 1.2–2.2)
Albumin: 4.5 g/dL (ref 4.0–5.0)
Alkaline Phosphatase: 76 IU/L (ref 48–121)
BUN/Creatinine Ratio: 26 — ABNORMAL HIGH (ref 9–20)
BUN: 18 mg/dL (ref 6–24)
Bilirubin Total: 0.7 mg/dL (ref 0.0–1.2)
CO2: 25 mmol/L (ref 20–29)
Calcium: 9.3 mg/dL (ref 8.7–10.2)
Chloride: 102 mmol/L (ref 96–106)
Creatinine, Ser: 0.7 mg/dL — ABNORMAL LOW (ref 0.76–1.27)
GFR calc Af Amer: 128 mL/min/{1.73_m2} (ref >59)
GFR calc non Af Amer: 111 mL/min/{1.73_m2} (ref >59)
Globulin, Total: 2.7 g/dL (ref 1.5–4.5)
Glucose: 99 mg/dL (ref 65–99)
Potassium: 4.6 mmol/L (ref 3.5–5.2)
Sodium: 141 mmol/L (ref 134–144)
Total Protein: 7.2 g/dL (ref 6.0–8.5)

## 2019-09-08 LAB — LIPID PANEL WITH LDL/HDL RATIO
Cholesterol, Total: 106 mg/dL (ref 100–199)
HDL: 34 mg/dL — ABNORMAL LOW (ref >39)
LDL Chol Calc (NIH): 49 mg/dL (ref 0–99)
LDL/HDL Ratio: 1.4 ratio (ref 0.0–3.6)
Triglycerides: 127 mg/dL (ref 0–149)
VLDL Cholesterol Cal: 23 mg/dL (ref 5–40)

## 2019-09-08 LAB — VITAMIN D 25 HYDROXY (VIT D DEFICIENCY, FRACTURES): Vit D, 25-Hydroxy: 9.1 ng/mL — ABNORMAL LOW (ref 30.0–100.0)

## 2019-09-15 NOTE — Telephone Encounter (Signed)
Has been completed

## 2019-09-15 NOTE — Telephone Encounter (Signed)
I have faxed the paperwork and sent a MyChart message to let patient know that I did receive the fax confirmations on both sets of paperwork.

## 2019-09-15 NOTE — Telephone Encounter (Signed)
Please  Copy and save a copy and send on  To scan   As always there is possibility of rejection and a redo

## 2019-09-17 ENCOUNTER — Encounter: Payer: Self-pay | Admitting: Family Medicine

## 2019-09-17 ENCOUNTER — Ambulatory Visit (INDEPENDENT_AMBULATORY_CARE_PROVIDER_SITE_OTHER): Payer: BC Managed Care – PPO | Admitting: Family Medicine

## 2019-09-17 ENCOUNTER — Other Ambulatory Visit: Payer: Self-pay

## 2019-09-17 ENCOUNTER — Ambulatory Visit: Payer: Self-pay

## 2019-09-17 ENCOUNTER — Ambulatory Visit (INDEPENDENT_AMBULATORY_CARE_PROVIDER_SITE_OTHER): Payer: BC Managed Care – PPO

## 2019-09-17 VITALS — BP 118/72 | HR 97 | Ht 71.0 in | Wt 295.0 lb

## 2019-09-17 DIAGNOSIS — M79661 Pain in right lower leg: Secondary | ICD-10-CM | POA: Diagnosis not present

## 2019-09-17 DIAGNOSIS — M25561 Pain in right knee: Secondary | ICD-10-CM

## 2019-09-17 DIAGNOSIS — R600 Localized edema: Secondary | ICD-10-CM | POA: Diagnosis not present

## 2019-09-17 MED ORDER — PREGABALIN 75 MG PO CAPS: 75.0000 mg | ORAL_CAPSULE | Freq: Two times a day (BID) | ORAL | 3 refills | Status: DC | PRN

## 2019-09-17 NOTE — Patient Instructions (Addendum)
Thank you for coming in today. Plan for xray today.  Try switching from gabapentin to lyrica.  Let me know if this helps.  Plan for PT.  Try using body helix full ankle sleeve over the compression stocking.   Recheck in 6 weeks.  Return sooner if needed.

## 2019-09-17 NOTE — Progress Notes (Signed)
Subjective:    I'm seeing this patient as a consultation for:  Dr. Regis Bill. Note will be routed back to referring provider/PCP.  CC: R leg pain  I, Molly Weber, LAT, ATC, am serving as scribe for Dr. [name].  HPI: Pt is a 49 y/o male presenting w/ c/o R leg pain.  He has a hx of 3 DVTs in his R leg w/ his most recent venous doppler US on 04/21/19 showing a thrombus in his R femoral and popliteal veins.  He has been taking Eliqus and Gabapentin.  He locates his pain to lower leg. Pain has not improved since November. Pain is over distal gastroc into achilles that wraps around ankle. Wears pressure stockings for edema which increases over the day. History of fibular fx in 1996.   Radiating pain: R leg swelling: yes Aggravating factors: Treatments tried: Eliqus and Gabapentin; Baclofen  Diagnostic testing: R LE venous doppler US- 04/21/19, 02/02/19, 05/03/15  Past medical history, Surgical history, Family history, Social history, Allergies, and medications have been entered into the medical record, reviewed.   Review of Systems: No new headache, visual changes, nausea, vomiting, diarrhea, constipation, dizziness, abdominal pain, skin rash, fevers, chills, night sweats, weight loss, swollen lymph nodes, body aches, joint swelling, muscle aches, chest pain, shortness of breath, mood changes, visual or auditory hallucinations.   Objective:    Vitals:   09/17/19 0956  BP: 118/72  Pulse: 97  SpO2: 99%   General: Well Developed, well nourished, and in no acute distress.  Neuro/Psych: Alert and oriented x3, extra-ocular muscles intact, able to move all 4 extremities, sensation grossly intact. Skin: Warm and dry, no rashes noted.  Respiratory: Not using accessory muscles, speaking in full sentences, trachea midline.  Cardiovascular: Pulses palpable, no extremity edema. Abdomen: Does not appear distended. MSK: Right calf swollen with some erythema.  Nonpitting edema present. Not  particularly tender to palpation normal ankle motion. Right ankle mild effusion not particularly tender to palpation.  Normal motion and strength.  Lab and Radiology Results  X-ray images right tib-fib obtained today personally and independently reviewed Mild DJD present knee and ankle.  Postsurgical changes fibula distal.  No acute fractures or other severe changes. Await formal radiology review  Diagnostic Limited MSK Ultrasound of: Right calf and ankle Posterior calf intact Achilles tendon with normal-appearing gastrocnemius musculature. Ankle degenerative changes with mild effusion present. Intact posterior tibialis and peroneal tendons. Impression: Ankle DJD with effusion   Impression and Recommendations:    Assessment and Plan: 49 y.o. male with persistent right calf pain.  Patient has history of fibular fracture and history of infected hardware requiring surgical removal.  Additionally has history of recurrent DVT and venous reflux.  Pain is probably due to the persistent swelling from chronic DVT/venous reflux.  However certainly could be other explanation as well.  Will proceed with work-up including x-ray today.  Will have trial of physical therapy and compressive ankle sleeve on top of his compression stockings.  Check back in 6 weeks if not better would proceed with either nerve conduction study to evaluate for nerve injury or even three-phase bone scan to evaluate for RSD.  Additionally will try treatment with Lyrica.  Already taking limited gabapentin will switch from gabapentin to Lyrica and see if that works any better.[name]  PDMP not reviewed this encounter. Orders Placed This Encounter  Procedures  . [name] LIMITED JOINT SPACE STRUCTURES LOW RIGHT(NO LINKED CHARGES)    Order Specific Question:   Reason for  Exam (SYMPTOM  OR DIAGNOSIS REQUIRED)    Answer:   lower leg pain    Order Specific Question:   Preferred imaging location?    Answer:   [place]    . DG Tibia/Fibula Right    Standing Status:   Future    Number of Occurrences:   1    Standing Expiration Date:   09/16/2020    Order Specific Question:   Reason for Exam (SYMPTOM  OR DIAGNOSIS REQUIRED)    Answer:   eval leg pain    Order Specific Question:   Preferred imaging location?    Answer:   [facility]    Order Specific Question:   Radiology Contrast Protocol - do NOT remove file path    Answer:   [email or web address]  . Ambulatory referral to Physical Therapy    Referral Priority:   Routine    Referral Type:   Physical Medicine    Referral Reason:   Specialty Services Required    Requested Specialty:   Physical Therapy   Meds ordered this encounter  Medications  . pregabalin (LYRICA) 75 MG capsule    Sig: Take 1 capsule (75 mg total) by mouth 2 (two) times daily as needed. Nerve pain    Dispense:  60 capsule    Refill:  3    Replaces gabapentin    Discussed warning signs or symptoms. Please see discharge instructions. Patient expresses understanding.   The above documentation has been reviewed and is accurate and complete [name], M.D.

## 2019-09-18 ENCOUNTER — Encounter: Payer: Self-pay | Admitting: Internal Medicine

## 2019-09-18 NOTE — Progress Notes (Signed)
X-ray right leg shows no fractures or significant bone changes.  We can see the previous fracture and leg soft tissue swelling.  No other abnormality seen.

## 2019-09-21 ENCOUNTER — Other Ambulatory Visit: Payer: Self-pay

## 2019-09-21 ENCOUNTER — Ambulatory Visit (INDEPENDENT_AMBULATORY_CARE_PROVIDER_SITE_OTHER): Payer: BC Managed Care – PPO | Admitting: Bariatrics

## 2019-09-21 ENCOUNTER — Encounter (INDEPENDENT_AMBULATORY_CARE_PROVIDER_SITE_OTHER): Payer: Self-pay | Admitting: Bariatrics

## 2019-09-21 VITALS — BP 108/69 | HR 87 | Temp 98.1°F | Ht 71.0 in | Wt 284.0 lb

## 2019-09-21 DIAGNOSIS — Z6839 Body mass index (BMI) 39.0-39.9, adult: Secondary | ICD-10-CM

## 2019-09-21 DIAGNOSIS — Z794 Long term (current) use of insulin: Secondary | ICD-10-CM

## 2019-09-21 DIAGNOSIS — Z9189 Other specified personal risk factors, not elsewhere classified: Secondary | ICD-10-CM | POA: Diagnosis not present

## 2019-09-21 DIAGNOSIS — E11319 Type 2 diabetes mellitus with unspecified diabetic retinopathy without macular edema: Secondary | ICD-10-CM | POA: Diagnosis not present

## 2019-09-21 DIAGNOSIS — E559 Vitamin D deficiency, unspecified: Secondary | ICD-10-CM

## 2019-09-21 MED ORDER — VITAMIN D (ERGOCALCIFEROL) 1.25 MG (50000 UNIT) PO CAPS: 50000.0000 [IU] | ORAL_CAPSULE | ORAL | 0 refills | Status: DC

## 2019-09-21 NOTE — Progress Notes (Signed)
Chief Complaint:   Troy [name] is here to discuss his progress with his Troy treatment plan along with follow-up of his Troy related diagnoses. Ademide is on the Category 3 Plan and states he is following his eating plan approximately 95% of the time. Domonick states he is doing arm weights 15-30 minutes 5-6 times per week.  Today's visit was #: 2 Starting weight: 293 lbs Starting date: 09/07/2019 Today's weight: 284 lbs Today's date: 09/21/2019 Total lbs lost to date: 9 Total lbs lost since last in-office visit: 9  Interim History: Troy [name] is down 9 lbs and doing well overall. He states there are not too many changes and he reports doing well with his water intake.  Subjective:   Vitamin D deficiency. Troy [name] is not on Vitamin D supplementation.    Ref. Range 09/07/2019 14:32  Vitamin D, 25-Hydroxy Latest Ref Range: 30.0 - 100.0 ng/mL 9.1 (L)   Type 2 diabetes mellitus with retinopathy without macular edema, with long-term current use of insulin, unspecified laterality, unspecified retinopathy severity ([place]). Fasting blood sugars 68-72. [name] is taking Invokana, Humalog, metformin, and Ozempic.   Lab Results  Component Value Date   HGBA1C 6.4 (A) 07/27/2019   HGBA1C 7.5 (A) 04/27/2019   HGBA1C 6.7 (A) 12/26/2018   Lab Results  Component Value Date   MICROALBUR 0.8 12/26/2018   LDLCALC 49 09/07/2019   CREATININE 0.70 (L) 09/07/2019   No results found for: INSULIN  At risk for hypoglycemia. [name] is at increased risk for hypoglycemia due to dietary changes and insulin.   Assessment/Plan:   Vitamin D deficiency. Low Vitamin D level contributes to fatigue and are associated with Troy, breast, and colon cancer. He was given a prescription for Vitamin D, Ergocalciferol, (DRISDOL) 1.25 MG (50000 UNIT) CAPS capsule every week #4 with 0 refills and will follow-up for routine testing of Vitamin D, at least 2-3 times per year to avoid over-replacement.    Type 2 diabetes mellitus with retinopathy without macular edema, with long-term current use of insulin, unspecified laterality, unspecified retinopathy severity ([place]). Good blood sugar control is important to decrease the likelihood of diabetic complications such as nephropathy, neuropathy, limb loss, blindness, coronary artery disease, and death. Intensive lifestyle modification including diet, exercise and weight loss are the first line of treatment for diabetes. Troy [name] will continue his medications as directed. He was instructed to call his endocrinologist ASAP to adjust his a.m. insulin dose.  At risk for hypoglycemia. Troy [name] was given approximately 15 minutes of counseling today regarding prevention of hypoglycemia. He was advised of symptoms of hypoglycemia. [name] was instructed to avoid skipping meals, eat regular protein rich meals and schedule low calorie snacks as needed.   Repetitive spaced learning was employed today to elicit superior memory formation and behavioral change.  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  Troy [name] is currently in the action stage of change. As such, his goal is to continue with weight loss efforts. He has agreed to the Category 3 Plan.   He will work on meal planning and intentional eating.   We reviewed with the patient labs from 09/07/2019 including CMP, lipids, Vitamin D, and glucose.  Exercise goals: All adults should avoid inactivity. Some physical activity is better than none, and adults who participate in any amount of physical activity gain some health benefits.  Behavioral modification strategies: increasing lean protein intake, decreasing simple  carbohydrates, increasing vegetables, increasing water intake, decreasing eating out, no skipping meals, meal planning and cooking strategies, keeping healthy foods in the home and planning for  success.  Troy [name] has agreed to follow-up with our clinic in 2 weeks. He was informed of the importance of frequent follow-up visits to maximize his success with intensive lifestyle modifications for his multiple health conditions.   Objective:   Blood pressure 108/69, pulse 87, temperature 98.1 F (36.7 C), height 5\' 11"  (1.803 m), weight 284 lb (128.8 kg), SpO2 97 %. Body mass index is 39.61 kg/m.  General: Cooperative, alert, well developed, in no acute distress. HEENT: Conjunctivae and lids unremarkable. Cardiovascular: Regular rhythm.  Lungs: Normal work of breathing. Neurologic: No focal deficits.   Lab Results  Component Value Date   CREATININE 0.70 (L) 09/07/2019   BUN 18 09/07/2019   NA 141 09/07/2019   K 4.6 09/07/2019   CL 102 09/07/2019   CO2 25 09/07/2019   Lab Results  Component Value Date   ALT 24 09/07/2019   AST 14 09/07/2019   ALKPHOS 76 09/07/2019   BILITOT 0.7 09/07/2019   Lab Results  Component Value Date   HGBA1C 6.4 (A) 07/27/2019   HGBA1C 7.5 (A) 04/27/2019   HGBA1C 6.7 (A) 12/26/2018   HGBA1C 7.3 (A) 09/16/2017   HGBA1C 8.7 06/10/2017   No results found for: INSULIN Lab Results  Component Value Date   TSH 1.32 04/27/2019   Lab Results  Component Value Date   CHOL 106 09/07/2019   HDL 34 (L) 09/07/2019   LDLCALC 49 09/07/2019   LDLDIRECT 125.0 12/26/2018   TRIG 127 09/07/2019   CHOLHDL 6 12/26/2018   Lab Results  Component Value Date   WBC 8.7 02/02/2019   HGB 16.0 02/02/2019   HCT 46.8 02/02/2019   MCV 94.8 02/02/2019   PLT 233.0 02/02/2019   Lab Results  Component Value Date   FERRITIN 141.3 07/22/2017   Attestation Statements:   Reviewed by clinician on day of visit: allergies, medications, problem list, medical history, surgical history, family history, social history, and previous encounter notes.  Migdalia Dk, am acting as Location manager for [employer], DO   I have reviewed the above documentation for  accuracy and completeness, and I agree with the above. Jearld Lesch, DO

## 2019-09-28 ENCOUNTER — Other Ambulatory Visit: Payer: Self-pay

## 2019-09-28 ENCOUNTER — Ambulatory Visit (INDEPENDENT_AMBULATORY_CARE_PROVIDER_SITE_OTHER): Payer: BC Managed Care – PPO | Admitting: Rehabilitative and Restorative Service Providers"

## 2019-09-28 ENCOUNTER — Encounter: Payer: Self-pay | Admitting: Rehabilitative and Restorative Service Providers"

## 2019-09-28 DIAGNOSIS — M6281 Muscle weakness (generalized): Secondary | ICD-10-CM

## 2019-09-28 DIAGNOSIS — R600 Localized edema: Secondary | ICD-10-CM | POA: Diagnosis not present

## 2019-09-28 DIAGNOSIS — R2689 Other abnormalities of gait and mobility: Secondary | ICD-10-CM | POA: Diagnosis not present

## 2019-09-28 DIAGNOSIS — M79661 Pain in right lower leg: Secondary | ICD-10-CM
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
## 2019-09-28 NOTE — Patient Instructions (Signed)
Access Code: [email or web address]: [email or web address]: 07/19/2021Prepared by: Gicela Schwarting HoltExercises  Prone Quadriceps Stretch with Strap - 2 x daily - 7 x weekly - 1 sets - 3 reps - 30 sec hold  Supine Piriformis Stretch - 2 x daily - 7 x weekly - 1 sets - 3 reps - 30 sec hold  Single Leg Stance with Support - 2 x daily - 7 x weekly - 1 sets - 5 reps - 20-30 sec hold

## 2019-10-02 ENCOUNTER — Telehealth: Payer: Self-pay | Admitting: Rehabilitative and Restorative Service Providers"

## 2019-10-02 ENCOUNTER — Other Ambulatory Visit: Payer: Self-pay | Admitting: Internal Medicine

## 2019-10-02 NOTE — Telephone Encounter (Signed)
Left message for patient re- resources for lymphedema treatment providing contact number for [facility] facility that does lymphedema treatment. Ask patient to call with any questions or problems.  Troy [name] P. Helene Kelp PT, MPH 10/02/19 4:00 PM

## 2019-10-05 ENCOUNTER — Ambulatory Visit (INDEPENDENT_AMBULATORY_CARE_PROVIDER_SITE_OTHER): Payer: BC Managed Care – PPO | Admitting: Family Medicine

## 2019-10-05 ENCOUNTER — Ambulatory Visit (INDEPENDENT_AMBULATORY_CARE_PROVIDER_SITE_OTHER): Payer: BC Managed Care – PPO | Admitting: Rehabilitative and Restorative Service Providers"

## 2019-10-05 ENCOUNTER — Other Ambulatory Visit: Payer: Self-pay

## 2019-10-05 ENCOUNTER — Encounter: Payer: Self-pay | Admitting: Rehabilitative and Restorative Service Providers"

## 2019-10-05 ENCOUNTER — Encounter (INDEPENDENT_AMBULATORY_CARE_PROVIDER_SITE_OTHER): Payer: Self-pay | Admitting: Family Medicine

## 2019-10-05 VITALS — BP 103/75 | HR 98 | Temp 98.0°F | Ht 71.0 in | Wt 288.0 lb

## 2019-10-05 DIAGNOSIS — Z794 Long term (current) use of insulin: Secondary | ICD-10-CM

## 2019-10-05 DIAGNOSIS — E559 Vitamin D deficiency, unspecified: Secondary | ICD-10-CM | POA: Diagnosis not present

## 2019-10-05 DIAGNOSIS — M79661 Pain in right lower leg: Secondary | ICD-10-CM

## 2019-10-05 DIAGNOSIS — R600 Localized edema: Secondary | ICD-10-CM

## 2019-10-05 DIAGNOSIS — M6281 Muscle weakness (generalized): Secondary | ICD-10-CM

## 2019-10-05 DIAGNOSIS — R2689 Other abnormalities of gait and mobility: Secondary | ICD-10-CM

## 2019-10-05 DIAGNOSIS — E1169 Type 2 diabetes mellitus with other specified complication: Secondary | ICD-10-CM | POA: Diagnosis not present

## 2019-10-05 DIAGNOSIS — Z6841 Body Mass Index (BMI) 40.0 and over, adult: Secondary | ICD-10-CM

## 2019-10-05 NOTE — Therapy (Addendum)
[place], [place], [address] Phone: [phone]   Fax:  [phone]  Physical Therapy Treatment  Patient Details  Name: Troy [name] MRN: [id number] Date of Birth: 09-18-1970 Referring Provider (PT): Dr [name]    Encounter Date: 10/05/2019   PT End of Session - 10/05/19 0804    Visit Number 2    Number of Visits 12    Date for PT Re-Evaluation 11/09/19    PT Start Time 0800    PT Stop Time 0845    PT Time Calculation (min) 45 min    Activity Tolerance Patient tolerated treatment well           Past Medical History:  Diagnosis Date  . Arm weakness    right   . Asthma   . B12 deficiency   . Back pain   . Diabetes insipidus ([place])   . Diabetes mellitus without complication ([place])   . DVT (deep venous thrombosis) ([place])   . Leg edema, right   . Male infertility   . Obesity   . Obesity   . OSA (obstructive sleep apnea)     Past Surgical History:  Procedure Laterality Date  . BACK SURGERY    . CERVICAL DISC SURGERY     C6/C7 2009  . cubital tunnel left arm     2003  . ELBOW SURGERY    . FIBULA FRACTURE SURGERY     plate & pin removed due to infection 1997  . ULNAR NERVE REPAIR      There were no vitals filed for this visit.   Subjective Assessment - 10/05/19 0804    Subjective Patient reports that he has been working to walk heel to toe. He has done some exercises. Drove about 3 hours to [place] and back Saturday and noted that his leg was burning more after driving.    Currently in Pain? Yes    Pain Score 4     Pain Location Leg    Pain Orientation Right;Lower    Pain Descriptors / Indicators Burning    Pain Type Chronic pain                             [facility] Adult PT Treatment/Exercise - 10/05/19 0001      Knee/Hip Exercises: Stretches   Sports administrator Right;3 reps;30 seconds    Quad Stretch Limitations prone with strap     ITB Stretch Right;3  reps;30 seconds    ITB Stretch Limitations supine with strap     Piriformis Stretch Right;30 seconds;3 reps   supine travell with strap    Gastroc Stretch Right;3 reps;30 seconds   2 reps Lt LE    Soleus Stretch Right;3 reps;30 seconds   2 reps Lt LE      Knee/Hip Exercises: Aerobic   Recumbent Bike L1 x 5 min       Knee/Hip Exercises: Standing   Wall Squat 10 reps;10 seconds   1/4 squat    SLS 30 sec x 3 reps each LE    Gait Training laps around gym ~ 80 ft x 3 laps x 2 sets working on heel toe stance to toe off                   PT Education - 10/05/19 0824    Education Details HEP    Person(s) Educated Patient    Methods Explanation;Demonstration;Tactile  cues;Verbal cues;Handout    Comprehension Verbalized understanding;Returned demonstration;Verbal cues required;Tactile cues required               PT Long Term Goals - 09/28/19 1333      PT LONG TERM GOAL #1   Title Patient verbalizes and demonstrates improved postures and positions to decrease LE pain    Time 6    Period Weeks    Status New    Target Date 11/09/19      PT LONG TERM GOAL #2   Title Increase mobility though Rt LE to equal Lt LE    Time 6    Period Weeks    Status New    Target Date 11/09/19      PT LONG TERM GOAL #3   Title Imrpove gait pattern with heel toe gait with no limp    Time 6    Period Weeks    Status New    Target Date 11/09/19      PT LONG TERM GOAL #4   Title Independent in HEP    Time 6    Period Weeks    Status New    Target Date 11/09/19      PT LONG TERM GOAL #5   Title Improve FOTO to </= 42% limitation    Time 6    Period Weeks    Status New    Target Date 11/09/19                 Plan - 10/05/19 1601    Clinical Impression Statement Patient reports that he has worked on his exercises at home. He has tried walking heel to toe. Burning pain continues and is increased with driving and prolonged positions. Added stretches for LE. Reviewed exercises.  Discussed transfer of PT to lymphedema clinic. Patient to contact clinic.    Rehab Potential Fair    PT Frequency 2x / week    PT Duration 6 weeks    PT Treatment/Interventions Patient/family education;ADLs/Self Care Home Management;Aquatic Therapy;Cryotherapy;Electrical Stimulation;Iontophoresis 61m/ml Dexamethasone;Moist Heat;Ultrasound;Gait training;Functional mobility training;Therapeutic activities;Therapeutic exercise;Balance training;Neuromuscular re-education;Manual techniques;Dry needling;Taping;Vasopneumatic Device;Manual lymph drainage;Compression bandaging    PT Next Visit Plan explore options for treatment for lymphedema; review HEP; progress with strengthening and balance activities as indicated    PT Home Exercise Plan [id number]    Consulted and Agree with Plan of Care Patient           Patient will benefit from skilled therapeutic intervention in order to improve the following deficits and impairments:     Visit Diagnosis: Pain in right lower leg  Localized edema  Muscle weakness (generalized)  Other abnormalities of gait and mobility     Problem List Patient Active Problem List   Diagnosis Date Noted  . Vitamin D deficiency 09/08/2019  . Low HDL (under 40) 09/08/2019  . Pancreatic insufficiency 03/28/2018  . Type 2 diabetes mellitus with retinopathy, with long-term current use of insulin ([place] 09/16/2017  . DVT (deep venous thrombosis), right 04/28/2015  . Acute deep vein thrombosis (DVT) of femoral vein of right lower extremity ([place] [facility] [place] 04/28/2015  . Neck injury 12/22/2013  . Hemorrhoid 03/23/2013  . Varicose vein of leg right 03/23/2013  . Visit for preventive health examination 03/23/2013  . Diastasis recti 12/22/2012  . Tendinitis of right shoulder 09/27/2011  . Right shoulder pain 09/27/2011  . Allergic rhinitis, cause unspecified 05/23/2011  . Sleep disturbance, unspecified 02/21/2011  . 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 Preventative health care  12/05/2010  . Chronic diarrhea of unknown origin pt says not from metformin 12/05/2010  . 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  . URINARY URGENCY 01/21/2009  . Hyperlipidemia 01/23/2008  . Obstructive sleep apnea 12/22/2007  . VARICOSE VEINS, LOWER EXTREMITIES 10/03/2007  . ASTHMA 09/19/2007  . ASTHMA UNSPECIFIED WITH EXACERBATION 09/19/2007  . PLANTAR FASCIITIS, LEFT 06/23/2007  . RASH AND OTHER NONSPECIFIC SKIN ERUPTION 05/26/2007  . EDEMA 05/26/2007  . MORBID OBESITY 02/13/2007  . DVT, HX OF 01/15/2007  . 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    Tally Mattox Nilda Simmer PT,  MPH  10/05/2019, 8:47 AM  [facility] [place], [place], [address] Phone: [phone]   Fax:  [phone]  Name: JAVANTE NILSSON MRN: [id number] Date of Birth: 1970/07/22  PHYSICAL THERAPY DISCHARGE SUMMARY  Visits from Start of Care: 2  Current functional level related to goals / functional outcomes: See progress note    Remaining deficits: Unknown    Education / Equipment: HEP  Plan: Patient agrees to discharge.  Patient goals were not met. Patient is being discharged due to not returning since the last visit.  ?????     Raife Lizer P. Helene Kelp PT, MPH 12/10/19 10:53 AM

## 2019-10-05 NOTE — Progress Notes (Signed)
Chief Complaint:   OBESITY Troy [name] is here to discuss his progress with his obesity treatment plan along with follow-up of his obesity related diagnoses. Troy [name] is on the Category 3 Plan and states he is following his eating plan approximately 90% of the time. Troy [name] states he is doing PT and arm exercises for 40 minutes 4-5 times per week.  Today's visit was #: 3 Starting weight: 293 lbs Starting date: 09/07/2019 Today's weight: 288 lbs Today's date: 10/05/2019 Total lbs lost to date: 5 lb Total lbs lost since last in-office visit: 0  Interim History: Troy [name] says his wife's birthday was last weekend and this past one was Christmas in July.  He was off plan for the weekends.  His wife is also on the same diet.  He is not surprised by his weight gain.  He says he plans to get back on it.  He reports drinking over 100 ounces of water per day.  Subjective:   1. Type 2 diabetes mellitus with other specified complication, with long-term current use of insulin ([name]) Medications reviewed. Diabetic ROS: no polyuria or polydipsia, no chest pain, dyspnea or TIA's, no numbness, tingling or pain in extremities.  Fasting blood sugar 106, 92, and he occasionally has a low.  He sees Dr. Cruzita Lederer every 3 months and his blood sugar log goes automatically to her office.  No concerns or symptoms currently.  Lab Results  Component Value Date   HGBA1C 6.4 (A) 07/27/2019   HGBA1C 7.5 (A) 04/27/2019   HGBA1C 6.7 (A) 12/26/2018   Lab Results  Component Value Date   MICROALBUR 0.8 12/26/2018   LDLCALC 49 09/07/2019   CREATININE 0.70 (L) 09/07/2019   2. Vitamin D deficiency Troy [name]'s Vitamin D level was 9.1 on 09/07/2019. He is currently taking prescription vitamin D 50,000 IU each week. He denies nausea, vomiting or muscle weakness.  He just started taking his weekly vitamin D 2 weeks ago.  No issues with the medication.  Assessment/Plan:   1. Type 2 diabetes mellitus with other specified  complication, with long-term current use of insulin ([place]) Discussed labs with patient today.  Good blood sugar control is important to decrease the likelihood of diabetic complications such as nephropathy, neuropathy, limb loss, blindness, coronary artery disease, and death. Intensive lifestyle modification including diet, exercise and weight loss are the first line of treatment for diabetes.  Management per Dr. Cruzita Lederer.  Will continue to monitor.  Continue prudent nutritional plan and weight loss.  He knows that with weight loss, blood glucose will go down, so will closely monitor.  He has a [employer].  2. Vitamin D deficiency Low Vitamin D level contributes to fatigue and are associated with obesity, breast, and colon cancer. He agrees to continue to take prescription Vitamin D @50 ,000 IU every week and will follow-up for routine testing of Vitamin D, at least 2-3 times per year to avoid over-replacement.  3. Class 3 severe obesity with serious comorbidity and body mass index (BMI) of 40.0 to 44.9 in adult, unspecified obesity type [facility]) Troy [name] is currently in the action stage of change. As such, his goal is to continue with weight loss efforts. He has agreed to the Category 3 Plan.   Exercise goals: As is.  Behavioral modification strategies: increasing lean protein intake, increasing vegetables, meal planning and cooking strategies, better snacking choices, travel eating strategies and planning for success.  [name] has agreed to follow-up with our clinic in 2 weeks. He was informed  of the importance of frequent follow-up visits to maximize his success with intensive lifestyle modifications for his multiple health conditions.   Objective:   Blood pressure 103/75, pulse 98, temperature 98 F (36.7 C), height 5\' 11"  (1.803 m), weight (!) 288 lb (130.6 kg), SpO2 98 %. Body mass index is 40.17 kg/m.  General: Cooperative, alert, well developed, in no acute distress. HEENT: Conjunctivae  and lids unremarkable. Cardiovascular: Regular rhythm.  Lungs: Normal work of breathing. Neurologic: No focal deficits.   Lab Results  Component Value Date   CREATININE 0.70 (L) 09/07/2019   BUN 18 09/07/2019   NA 141 09/07/2019   K 4.6 09/07/2019   CL 102 09/07/2019   CO2 25 09/07/2019   Lab Results  Component Value Date   ALT 24 09/07/2019   AST 14 09/07/2019   ALKPHOS 76 09/07/2019   BILITOT 0.7 09/07/2019   Lab Results  Component Value Date   HGBA1C 6.4 (A) 07/27/2019   HGBA1C 7.5 (A) 04/27/2019   HGBA1C 6.7 (A) 12/26/2018   HGBA1C 7.3 (A) 09/16/2017   HGBA1C 8.7 06/10/2017   Lab Results  Component Value Date   TSH 1.32 04/27/2019   Lab Results  Component Value Date   CHOL 106 09/07/2019   HDL 34 (L) 09/07/2019   LDLCALC 49 09/07/2019   LDLDIRECT 125.0 12/26/2018   TRIG 127 09/07/2019   CHOLHDL 6 12/26/2018   Lab Results  Component Value Date   WBC 8.7 02/02/2019   HGB 16.0 02/02/2019   HCT 46.8 02/02/2019   MCV 94.8 02/02/2019   PLT 233.0 02/02/2019   Lab Results  Component Value Date   FERRITIN 141.3 07/22/2017   Attestation Statements:   Reviewed by clinician on day of visit: allergies, medications, problem list, medical history, surgical history, family history, social history, and previous encounter notes.  Time spent on visit including pre-visit chart review and post-visit care and charting was 30 minutes.   I, Water quality scientist, CMA, am acting as Location manager for [employer], DO.  I have reviewed the above documentation for accuracy and completeness, and I agree with the above. Mellody Dance, DO

## 2019-10-05 NOTE — Patient Instructions (Addendum)
  Access Code: [email or web address]: [email or web address]: 07/26/2021Prepared by: Annalysse Shoemaker HoltExercises  Prone Quadriceps Stretch with Strap - 2 x daily - 7 x weekly - 1 sets - 3 reps - 30 sec hold  Supine Piriformis Stretch - 2 x daily - 7 x weekly - 1 sets - 3 reps - 30 sec hold  Single Leg Stance with Support - 2 x daily - 7 x weekly - 1 sets - 5 reps - 20-30 sec hold  Gastroc Stretch on Wall - 2 x daily - 7 x weekly - 1 sets - 3 reps - 30 sec hold  Soleus Stretch on Wall - 2 x daily - 7 x weekly - 1 sets - 3 reps - 30 sec hold  Wall Quarter Squat - 2 x daily - 7 x weekly - 1-2 sets - 10 reps - 5-10 sec hold  Supine ITB Stretch with Strap - 2 x daily - 7 x weekly - 1 sets - 3 reps - 30 sec hold

## 2019-10-12 ENCOUNTER — Encounter: Payer: Self-pay | Admitting: Family Medicine

## 2019-10-12 ENCOUNTER — Encounter: Payer: BC Managed Care – PPO | Admitting: Physical Therapy

## 2019-10-12 DIAGNOSIS — M79661 Pain in right lower leg: Secondary | ICD-10-CM

## 2019-10-12 DIAGNOSIS — M25561 Pain in right knee: Secondary | ICD-10-CM

## 2019-10-14 ENCOUNTER — Other Ambulatory Visit: Payer: Self-pay | Admitting: Internal Medicine

## 2019-10-17 ENCOUNTER — Other Ambulatory Visit (INDEPENDENT_AMBULATORY_CARE_PROVIDER_SITE_OTHER): Payer: Self-pay | Admitting: Bariatrics

## 2019-10-17 DIAGNOSIS — E559 Vitamin D deficiency, unspecified: Secondary | ICD-10-CM

## 2019-10-19 ENCOUNTER — Encounter (INDEPENDENT_AMBULATORY_CARE_PROVIDER_SITE_OTHER): Payer: Self-pay | Admitting: Family Medicine

## 2019-10-19 ENCOUNTER — Encounter: Payer: BC Managed Care – PPO | Admitting: Rehabilitative and Restorative Service Providers"

## 2019-10-19 ENCOUNTER — Ambulatory Visit (INDEPENDENT_AMBULATORY_CARE_PROVIDER_SITE_OTHER): Payer: BC Managed Care – PPO | Admitting: Family Medicine

## 2019-10-19 ENCOUNTER — Other Ambulatory Visit: Payer: Self-pay

## 2019-10-19 VITALS — BP 92/66 | HR 100 | Temp 98.7°F | Ht 71.0 in | Wt 280.0 lb

## 2019-10-19 DIAGNOSIS — Z9189 Other specified personal risk factors, not elsewhere classified: Secondary | ICD-10-CM | POA: Diagnosis not present

## 2019-10-19 DIAGNOSIS — Z794 Long term (current) use of insulin: Secondary | ICD-10-CM

## 2019-10-19 DIAGNOSIS — E559 Vitamin D deficiency, unspecified: Secondary | ICD-10-CM | POA: Diagnosis not present

## 2019-10-19 DIAGNOSIS — Z6839 Body mass index (BMI) 39.0-39.9, adult: Secondary | ICD-10-CM

## 2019-10-19 DIAGNOSIS — E1169 Type 2 diabetes mellitus with other specified complication: Secondary | ICD-10-CM

## 2019-10-19 MED ORDER — VITAMIN D (ERGOCALCIFEROL) 1.25 MG (50000 UNIT) PO CAPS: 50000.0000 [IU] | ORAL_CAPSULE | ORAL | 0 refills | Status: DC

## 2019-10-20 NOTE — Progress Notes (Signed)
Chief Complaint:   OBESITY Troy [name] is here to discuss his progress with his obesity treatment plan along with follow-up of his obesity related diagnoses. [name] is on the Category 3 Plan and states he is following his eating plan approximately 90-95% of the time. [name] states he is doing PT for his legs and lifting weights for his arms for 20-30 minutes 5 times per week.  Today's visit was #: 4 Starting weight: 293 lbs Starting date: 09/07/2019 Today's weight: 280 lbs Today's date: 10/19/2019 Total lbs lost to date: 13 lbs Total lbs lost since last in-office visit: 8 lbs  Interim History: Troy [name] says that he has been snacking on [name] jerky from [name] and [name].  He has lost a total of 18 pounds since starting with [name] at the end of June.  His wife is on the diet also.  Their 49 year old son lives with them.  He mostly eats chicken or other lean meats for dinner with 2 cups of vegetables and the same thing for breakfast and lunch.  He is not bored and is tolerating the plan well.  No complaints.  He says his hunger is well-controlled and his cravings are good.  Subjective:   1. Type 2 diabetes mellitus with other specified complication, with long-term current use of insulin ([name]) Medications reviewed. Diabetic ROS: no polyuria or polydipsia, no chest pain, dyspnea or TIA's, no numbness, tingling or pain in extremities.  Management per Dr. Cruzita Lederer.  He says that 80-90% of the time with the [facility] his blood sugar is at goal.  Blood sugars are not going too low within the past few weeks.  The had one 60-65, and he ate 2 bites of cheesecake and his only symptom was excess hunger with no other symptoms at all.  Lab Results  Component Value Date   HGBA1C 6.4 (A) 07/27/2019   HGBA1C 7.5 (A) 04/27/2019   HGBA1C 6.7 (A) 12/26/2018   Lab Results  Component Value Date   MICROALBUR 0.8 12/26/2018   LDLCALC 49 09/07/2019   CREATININE 0.70 (L) 09/07/2019   2. Vitamin D  deficiency [name]'s Vitamin D level was 9.1 on 09/07/2019. He is currently taking prescription vitamin D 50,000 IU each week. He denies nausea, vomiting or muscle weakness.  3. At risk for osteoporosis Troy [name] is at higher risk of osteopenia and osteoporosis due to Vitamin D deficiency.   Assessment/Plan:   1. Type 2 diabetes mellitus with other specified complication, with long-term current use of insulin ([name]) Good blood sugar control is important to decrease the likelihood of diabetic complications such as nephropathy, neuropathy, limb loss, blindness, coronary artery disease, and death. Intensive lifestyle modification including diet, exercise and weight loss are the first line of treatment for diabetes.  Treatment plan per Endo.  Will monitor closely.  Continue prudent nutritional plan and weight loss.  2. Vitamin D deficiency Low Vitamin D level contributes to fatigue and are associated with obesity, breast, and colon cancer. He agrees to continue to take prescription Vitamin D @50 ,000 IU every week and will follow-up for routine testing of Vitamin D, at least 2-3 times per year to avoid over-replacement. - Vitamin D, Ergocalciferol, (DRISDOL) 1.25 MG (50000 UNIT) CAPS capsule; Take 1 capsule (50,000 Units total) by mouth every 7 (seven) days.  Dispense: 4 capsule; Refill: 0  3. At risk for osteoporosis Troy [name] was given approximately 15 minutes of osteoporosis prevention counseling today. Troy [name] is at risk for osteopenia and osteoporosis due to his  Vitamin D deficiency. He was encouraged to take his Vitamin D and follow his higher calcium diet and increase strengthening exercise to help strengthen his bones and decrease his risk of osteopenia and osteoporosis.  Repetitive spaced learning was employed today to elicit superior memory formation and behavioral change.  4. Class 2 severe obesity with serious comorbidity and body mass index (BMI) of 39.0 to 39.9 in adult, unspecified obesity  type [facility]) Troy [name] is currently in the action stage of change. As such, his goal is to continue with weight loss efforts. He has agreed to the Category 3 Plan.   Exercise goals: As is.  Behavioral modification strategies: increasing lean protein intake, increasing water intake, meal planning and cooking strategies and planning for success.  [name] has agreed to follow-up with our clinic in 2 weeks. He was informed of the importance of frequent follow-up visits to maximize his success with intensive lifestyle modifications for his multiple health conditions.   Objective:   Blood pressure 92/66, pulse 100, temperature 98.7 F (37.1 C), height 5\' 11"  (1.803 m), weight 280 lb (127 kg), SpO2 97 %. Body mass index is 39.05 kg/m.  General: Cooperative, alert, well developed, in no acute distress. HEENT: Conjunctivae and lids unremarkable. Cardiovascular: Regular rhythm.  Lungs: Normal work of breathing. Neurologic: No focal deficits.   Lab Results  Component Value Date   CREATININE 0.70 (L) 09/07/2019   BUN 18 09/07/2019   NA 141 09/07/2019   K 4.6 09/07/2019   CL 102 09/07/2019   CO2 25 09/07/2019   Lab Results  Component Value Date   ALT 24 09/07/2019   AST 14 09/07/2019   ALKPHOS 76 09/07/2019   BILITOT 0.7 09/07/2019   Lab Results  Component Value Date   HGBA1C 6.4 (A) 07/27/2019   HGBA1C 7.5 (A) 04/27/2019   HGBA1C 6.7 (A) 12/26/2018   HGBA1C 7.3 (A) 09/16/2017   HGBA1C 8.7 06/10/2017   Lab Results  Component Value Date   TSH 1.32 04/27/2019   Lab Results  Component Value Date   CHOL 106 09/07/2019   HDL 34 (L) 09/07/2019   LDLCALC 49 09/07/2019   LDLDIRECT 125.0 12/26/2018   TRIG 127 09/07/2019   CHOLHDL 6 12/26/2018   Lab Results  Component Value Date   WBC 8.7 02/02/2019   HGB 16.0 02/02/2019   HCT 46.8 02/02/2019   MCV 94.8 02/02/2019   PLT 233.0 02/02/2019   Lab Results  Component Value Date   FERRITIN 141.3 07/22/2017   Attestation  Statements:   Reviewed by clinician on day of visit: allergies, medications, problem list, medical history, surgical history, family history, social history, and previous encounter notes.  I, Water quality scientist, CMA, am acting as transcriptionist for [employer], DO  I have reviewed the above documentation for accuracy and completeness, and I agree with the above. Mellody Dance, DO

## 2019-10-21 ENCOUNTER — Encounter: Payer: BC Managed Care – PPO | Admitting: Rehabilitative and Restorative Service Providers"

## 2019-10-21 NOTE — Telephone Encounter (Signed)
.   Some issues need clarification.  I advise  Check  bp twice a day for  5 days and send in readings  When resting      Then in person   Visit with readings to discuss

## 2019-10-26 ENCOUNTER — Encounter: Payer: BC Managed Care – PPO | Admitting: Rehabilitative and Restorative Service Providers"

## 2019-10-26 ENCOUNTER — Ambulatory Visit: Payer: BC Managed Care – PPO | Admitting: Family Medicine

## 2019-10-28 ENCOUNTER — Other Ambulatory Visit: Payer: Self-pay | Admitting: Internal Medicine

## 2019-11-02 ENCOUNTER — Ambulatory Visit (INDEPENDENT_AMBULATORY_CARE_PROVIDER_SITE_OTHER): Payer: BC Managed Care – PPO | Admitting: Family Medicine

## 2019-11-02 ENCOUNTER — Encounter: Payer: Self-pay | Admitting: Family Medicine

## 2019-11-02 ENCOUNTER — Ambulatory Visit (INDEPENDENT_AMBULATORY_CARE_PROVIDER_SITE_OTHER): Payer: BC Managed Care – PPO

## 2019-11-02 ENCOUNTER — Encounter (INDEPENDENT_AMBULATORY_CARE_PROVIDER_SITE_OTHER): Payer: Self-pay | Admitting: Family Medicine

## 2019-11-02 ENCOUNTER — Other Ambulatory Visit: Payer: Self-pay

## 2019-11-02 VITALS — BP 109/76 | HR 85 | Temp 98.0°F | Ht 71.0 in | Wt 279.0 lb

## 2019-11-02 VITALS — BP 102/74 | HR 93 | Ht 71.0 in | Wt 286.0 lb

## 2019-11-02 DIAGNOSIS — Z6839 Body mass index (BMI) 39.0-39.9, adult: Secondary | ICD-10-CM

## 2019-11-02 DIAGNOSIS — I10 Essential (primary) hypertension: Secondary | ICD-10-CM

## 2019-11-02 DIAGNOSIS — E1169 Type 2 diabetes mellitus with other specified complication: Secondary | ICD-10-CM

## 2019-11-02 DIAGNOSIS — I890 Lymphedema, not elsewhere classified: Secondary | ICD-10-CM | POA: Diagnosis not present

## 2019-11-02 DIAGNOSIS — E1159 Type 2 diabetes mellitus with other circulatory complications: Secondary | ICD-10-CM

## 2019-11-02 DIAGNOSIS — M25561 Pain in right knee: Secondary | ICD-10-CM

## 2019-11-02 DIAGNOSIS — E559 Vitamin D deficiency, unspecified: Secondary | ICD-10-CM

## 2019-11-02 DIAGNOSIS — Z9189 Other specified personal risk factors, not elsewhere classified: Secondary | ICD-10-CM | POA: Diagnosis not present

## 2019-11-02 DIAGNOSIS — Z794 Long term (current) use of insulin: Secondary | ICD-10-CM

## 2019-11-02 NOTE — Telephone Encounter (Signed)
Need to discuss   telephone or virtual  I sok since I see you have seen the dm and sm people

## 2019-11-02 NOTE — Progress Notes (Signed)
X-ray right knee looks pretty normal to radiology.

## 2019-11-02 NOTE — Progress Notes (Signed)
Chief Complaint:   OBESITY Troy [name] is here to discuss his progress with his obesity treatment plan along with follow-up of his obesity related diagnoses. [name] is on the Category 3 Plan and states Troy [name] is following his eating plan approximately 95% of the time. [name] states Troy [name] is doing arm exercises for 10-15 minutes 3 times per week.  Today's visit was #: 5 Starting weight: 293 lbs Starting date: 09/07/2019 Today's weight: 279 lbs Today's date: 11/09/2019 Total lbs lost to date: 14 lbs Total lbs lost since last in-office visit: 1 lbs  Interim History: Troy [name] and is wife are on the plan at ome.  Troy [name] says Troy [name] will be going back into the office tomorrow.  Troy [name] has concerns about meal planning.  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  Troy [name] reports no problems with the meal plan over the past 2 weeks.  Troy [name] says Troy [name] went out for [place] food and had rice and bread in a red curry sauce, etc., this past Saturday.  Troy [name] says that Troy [name] is overall happy with the meal plan without concerns.  Troy [name] states Troy [name] knows Troy [name] "will sometimes lose less or even gain".  Troy [name] appears happy with his 14 pound total weight loss thus far.  Subjective:   1. Type 2 diabetes mellitus with other specified complication, with long-term current use of insulin ([name]) Medications reviewed. Diabetic ROS: no polyuria or polydipsia, no chest pain, dyspnea or TIA's, no numbness, tingling or pain in extremities.  Last A1c was 6.4 about 3 months ago.  Troy [name] has lost 14 pounds since then.  Troy [name] is treated by Dr. Cruzita Lederer of Endocrinology.  Lab Results  Component Value Date   HGBA1C 6.4 (A) 07/27/2019   HGBA1C 7.5 (A) 04/27/2019   HGBA1C 6.7 (A) 12/26/2018   Lab Results  Component Value Date   MICROALBUR 0.8 12/26/2018   LDLCALC 49 09/07/2019   CREATININE 0.70 (L) 09/07/2019   2. Vitamin D deficiency Troy [name]'s Vitamin D level was 9.1 on 09/07/2019. Troy [name] is currently taking prescription  vitamin D 50,000 IU each week. Troy [name] denies nausea, vomiting or muscle weakness.  Troy [name] is tolerating vitamin D well with no side effects.  3. Hypertension associated with type 2 diabetes mellitus ([name]) Review: taking medications as instructed, no medication side effects noted, no chest pain on exertion, no dyspnea on exertion, no swelling of ankles.  Troy [name] recently got a cuff at home to check his blood pressure.  Troy [name] says that yesterday it was 115/64 when Troy [name] bought the cuff.  Troy [name] says Troy [name] is feeling okay and his blood pressure is up today from prior.  BP Readings from Last 3 Encounters:  11/09/19 105/64  11/02/19 109/76  11/02/19 102/74   4. At risk for complication associated with hypotension The patient is at a higher than average risk of hypotension due to weight loss.  Troy [name] has a blood pressure cuff and a follow-up scheduled with this PCP in the near future for this as well.  Assessment/Plan:   1. Type 2 diabetes mellitus with other specified complication, with long-term current use of insulin ([name]) Good blood sugar control is important to decrease the likelihood of diabetic complications such as nephropathy, neuropathy, limb loss, blindness, coronary artery disease, and death. Intensive lifestyle modification including diet, exercise and weight loss are the first line of treatment for diabetes.  Troy [name] prefers to get his labs with Endo and declines A1c.  Continue medications per specialist.  Continue prudent nutritional plan, weight loss, and close monitoring of blood sugar as Troy [name] has been doing.  2. Vitamin D deficiency Low Vitamin D level contributes to fatigue and are associated with obesity, breast, and colon cancer. Troy [name] agrees to continue to take prescription Vitamin D @50 ,000 IU every week and will follow-up for routine testing of Vitamin D, at least 2-3 times per year to avoid over-replacement.  Recheck levels in 3-4 months (early November or so) after starting medication.  3. Hypertension associated  with type 2 diabetes mellitus ([place]) [name] is working on healthy weight loss and exercise to improve blood pressure control. We will watch for signs of hypotension as Troy [name] continues his lifestyle modifications.  Troy [name] has a planned follow-up with his PCP in the near future for blood pressure.  Troy [name] will continue prudent nutritional plan and weight loss along with monitoring daily.  Troy [name] will bring his blood pressure log into his visits with us/PCP.  4. At risk for complication associated with hypotension Troy [name] was given approximately 15 minutes of education and counseling today to help avoid hypotension. We discussed risks of hypotension with weight loss and signs of hypotension such as feeling lightheaded or unsteady.  Repetitive spaced learning was employed today to elicit superior memory formation and behavioral change.  5. Class 2 severe obesity with serious comorbidity and body mass index (BMI) of 39.0 to 39.9 in adult, unspecified obesity type [facility]) Troy [name] is currently in the action stage of change. As such, his goal is to continue with weight loss efforts. Troy [name] has agreed to the Category 3 Plan.   Exercise goals: All adults should avoid inactivity. Some physical activity is better than none, and adults who participate in any amount of physical activity gain some health benefits.  Walking.  Behavioral modification strategies: increasing lean protein intake, decreasing simple carbohydrates, meal planning and cooking strategies (especially for going back to work), keeping healthy foods in the home and planning for success (especially at work).  Troy [name] has agreed to follow-up with our clinic in 2 weeks. Troy [name] was informed of the importance of frequent follow-up visits to maximize his success with intensive lifestyle modifications for his multiple health conditions.   Objective:   Blood pressure 109/76, pulse 85, temperature 98 F (36.7 C), height 5\' 11"  (1.803 m), weight 279 lb (126.6 kg), SpO2 97  %. Body mass index is 38.91 kg/m.  General: Cooperative, alert, well developed, in no acute distress. HEENT: Conjunctivae and lids unremarkable. Cardiovascular: Regular rhythm.  Lungs: Normal work of breathing. Neurologic: No focal deficits.   Lab Results  Component Value Date   CREATININE 0.70 (L) 09/07/2019   BUN 18 09/07/2019   NA 141 09/07/2019   K 4.6 09/07/2019   CL 102 09/07/2019   CO2 25 09/07/2019   Lab Results  Component Value Date   ALT 24 09/07/2019   AST 14 09/07/2019   ALKPHOS 76 09/07/2019   BILITOT 0.7 09/07/2019   Lab Results  Component Value Date   HGBA1C 6.4 (A) 07/27/2019   HGBA1C 7.5 (A) 04/27/2019   HGBA1C 6.7 (A) 12/26/2018   HGBA1C 7.3 (A) 09/16/2017   HGBA1C 8.7 06/10/2017   Lab Results  Component Value Date   TSH 1.32 04/27/2019   Lab Results  Component Value Date   CHOL 106 09/07/2019   HDL 34 (L) 09/07/2019   LDLCALC 49 09/07/2019   LDLDIRECT 125.0 12/26/2018   TRIG 127 09/07/2019   CHOLHDL 6 12/26/2018  Lab Results  Component Value Date   WBC 8.7 02/02/2019   HGB 16.0 02/02/2019   HCT 46.8 02/02/2019   MCV 94.8 02/02/2019   PLT 233.0 02/02/2019   Lab Results  Component Value Date   FERRITIN 141.3 07/22/2017   Attestation Statements:   Reviewed by clinician on day of visit: allergies, medications, problem list, medical history, surgical history, family history, social history, and previous encounter notes.  I, Water quality scientist, CMA, am acting as Location manager for [employer], DO.  I have reviewed the above documentation for accuracy and completeness, and I agree with the above. Mellody Dance, DO

## 2019-11-02 NOTE — Patient Instructions (Signed)
Thank you for coming in today. Plan for lymphedema treatment at the correct location. Get xray of the knee today.  Recheck if not improved in about 1-2 months.  Would consider knee injection.

## 2019-11-02 NOTE — Progress Notes (Signed)
I, Wendy Poet, LAT, ATC, am serving as scribe for Dr. [name].  Troy [name] is a 49 y.o. male who presents to [facility] today for f/u of R lower leg/calf pain.  He was last seen by Dr. [name] on 09/17/19 and was advised to continue using compression stockings and was referred to PT of which he has completed 2 visits.  He was also prescribed Lyrica to replace Gabapentin.  He has a hx of 3 prior DVTs in his R leg w/ the most recent beig on 04/21/19.  Since his last visit, pt reports that his R lower leg pain is worsening and now reports pain running up into his R knee.  He notes burning sensations into his R knee.  He was supposed to be referred to a lymphadema specialist but was instead referred to PT.  He has been taking the Lyrica.  He needs a referral placed to the [facility] which does outpatient lymphadema treatment but is not PT.  Last updated referral placed o 10/12/19 was not to the correct location.   Pertinent review of systems: No fevers or chills  Relevant historical information: History right-leg DVT   Exam:  BP 102/74 (BP Location: Right Arm, Patient Position: Sitting, Cuff Size: Large)   Pulse 93   Ht 5\' 11"  (1.803 m)   Wt 286 lb (129.7 kg)   SpO2 97%   BMI 39.89 kg/m  General: Well Developed, well nourished, and in no acute distress.   MSK: Right knee mild effusion slight swelling. Normal motion without crepitation. Stable ligamentous exam. Right lower leg edema present.    Lab and Radiology Results  X-ray images right knee obtained today personally and independently reviewed. Mild DJD worse lateral.  Mild patellofemoral DJD. Calcific change at distal patellar tendon insertion likely sequelae from [employer] as a child. Await formal radiology review    Assessment and Plan: 49 y.o. male with knee pain in addition to right lower leg lymphedema.  May be directly related to the  lymphedema/swelling.  Discussed options.  Patient has new referral to the appropriate location for lymphedema care for his leg.  This should be helpful.  If not sufficient patient will notify me and return to clinic likely would proceed with steroid injection at that time.    Orders Placed This Encounter  Procedures  . DG Knee AP/LAT W/Sunrise Right    Standing Status:   Future    Number of Occurrences:   1    Standing Expiration Date:   11/01/2020    Order Specific Question:   Reason for Exam (SYMPTOM  OR DIAGNOSIS REQUIRED)    Answer:   eval knee pain    Order Specific Question:   Preferred imaging location?    Answer:   [facility]    Order Specific Question:   Radiology Contrast Protocol - do NOT remove file path    Answer:   [email or web address]  . Ambulatory referral to Physical Therapy    Referral Priority:   Routine    Referral Type:   Physical Medicine    Referral Reason:   Specialty Services Required    Requested Specialty:   Physical Therapy    Number of Visits Requested:   1   No orders of the defined types were placed in this encounter.    Discussed warning signs or symptoms. Please see discharge instructions. Patient expresses understanding.   The above documentation has been  reviewed and is accurate and complete [name], M.D. Of note knee pain is a new issue today.

## 2019-11-09 ENCOUNTER — Telehealth (INDEPENDENT_AMBULATORY_CARE_PROVIDER_SITE_OTHER): Payer: BC Managed Care – PPO | Admitting: Internal Medicine

## 2019-11-09 ENCOUNTER — Other Ambulatory Visit: Payer: Self-pay

## 2019-11-09 ENCOUNTER — Encounter: Payer: Self-pay | Admitting: Internal Medicine

## 2019-11-09 VITALS — BP 105/64 | HR 76 | Ht 71.0 in | Wt 284.0 lb

## 2019-11-09 DIAGNOSIS — Z79899 Other long term (current) drug therapy: Secondary | ICD-10-CM

## 2019-11-09 DIAGNOSIS — I82401 Acute embolism and thrombosis of unspecified deep veins of right lower extremity: Secondary | ICD-10-CM

## 2019-11-09 DIAGNOSIS — E1169 Type 2 diabetes mellitus with other specified complication: Secondary | ICD-10-CM

## 2019-11-09 DIAGNOSIS — Z7901 Long term (current) use of anticoagulants: Secondary | ICD-10-CM

## 2019-11-09 DIAGNOSIS — I82501 Chronic embolism and thrombosis of unspecified deep veins of right lower extremity: Secondary | ICD-10-CM | POA: Diagnosis not present

## 2019-11-09 DIAGNOSIS — M79604 Pain in right leg: Secondary | ICD-10-CM | POA: Diagnosis not present

## 2019-11-09 DIAGNOSIS — Z794 Long term (current) use of insulin: Secondary | ICD-10-CM

## 2019-11-09 DIAGNOSIS — I890 Lymphedema, not elsewhere classified: Secondary | ICD-10-CM | POA: Diagnosis not present

## 2019-11-09 NOTE — Progress Notes (Signed)
Virtual Visit via Video Note  I connected with@ on 11/09/19 at  8:30 AM EDT by a video enabled telemedicine application and verified that I am speaking with the correct person using two identifiers. Location patient: vehicle  Location provider:work  office Persons participating in the virtual visit: patient, provider  [place]Ith national recommendations  regarding COVID 19 pandemic   video visit is advised over in office visit for this patient.  Patient aware  of the limitations of evaluation and management by telemedicine and  availability of in person appointments. and agreed to proceed.   HPI: Troy [name] presents for video visit he needs updated fmla  Documentation  To go from 4 day per month to 8 days to cover time off of what is happening   Pain and swelling  On going and some days excruciating   Now back in office since last week  Harder to raise leg at times and break  Now to see lymphedema    Spec for le  Today  For the extensive swelling  To see if would help .    Seeing weight  management and has los a good bit of weight  bp low side 90 - low 100s   On no new meds  DM controlled  No bleeding  ROS: See pertinent positives and negatives per HPI.  Past Medical History:  Diagnosis Date  . Arm weakness    right   . Asthma   . B12 deficiency   . Back pain   . Diabetes insipidus ([place])   . Diabetes mellitus without complication ([place])   . DVT (deep venous thrombosis) ([place])   . Leg edema, right   . Male infertility   . Obesity   . Obesity   . OSA (obstructive sleep apnea)     Past Surgical History:  Procedure Laterality Date  . BACK SURGERY    . CERVICAL DISC SURGERY     C6/C7 2009  . cubital tunnel left arm     2003  . ELBOW SURGERY    . FIBULA FRACTURE SURGERY     plate & pin removed due to infection 1997  . ULNAR NERVE REPAIR      Family History  Problem Relation Age of Onset  . Heart disease Mother   . Other Mother        clotting disorders  . Diabetes  Mother   . High blood pressure Mother   . Kidney disease Mother   . Depression Mother   . Sleep apnea Mother   . Obesity Mother   . Other Father        sudden death/cervical and lumbar disk disease  . Aneurysm Father        In his 27s smoked  . Hyperlipidemia Father   . High blood pressure Father   . Sudden death Father   . Alcoholism Father   . Hypertension Other   . Allergies Other   . Deep vein thrombosis Other   . Sleep apnea Other   . Obesity Other     Social History   Tobacco Use  . Smoking status: Never Smoker  . Smokeless tobacco: Never Used  Vaping Use  . Vaping Use: Never used  Substance Use Topics  . Alcohol use: Yes    Comment: 1 drink mo.  . Drug use: No      Current Outpatient Medications:  .  albuterol (PROVENTIL HFA;VENTOLIN HFA) 108 (90 Base) MCG/ACT inhaler, Inhale 2 puffs into  the lungs every 6 (six) hours as needed. For wheezing, Disp: 1 Inhaler, Rfl: 2 .  apixaban (ELIQUIS) 5 MG TABS tablet, TAKE 1 TABLET(5 MG) BY MOUTH TWICE DAILY, Disp: 180 tablet, Rfl: 1 .  atorvastatin (LIPITOR) 40 MG tablet, TAKE 1 TABLET(40 MG) BY MOUTH DAILY, Disp: 90 tablet, Rfl: 2 .  baclofen (LIORESAL) 10 MG tablet, TAKE 1 TABLET(10 MG) BY MOUTH THREE TIMES DAILY AS NEEDED, Disp: 40 tablet, Rfl: 0 .  Continuous Blood Gluc Sensor (FREESTYLE LIBRE 14 DAY SENSOR) MISC, 1 EACH BY DOES NOT APPLY ROUTE EVERY 14 (FOURTEEN) DAYS. CHANGE EVERY 2 WEEKS, Disp: 2 each, Rfl: 11 .  gabapentin (NEURONTIN) 300 MG capsule, Take 1 capsule (300 mg total) by mouth 3 (three) times daily., Disp: 270 capsule, Rfl: 3 .  Insulin Disposable Pump (V-GO 30) KIT, USE 1 TIME A DAY., Disp: 90 kit, Rfl: 3 .  insulin lispro (HUMALOG) 100 UNIT/ML injection, Inject 0.8 mLs (80 Units total) into the skin 3 (three) times daily before meals., Disp: 30 mL, Rfl: 3 .  INVOKANA 100 MG TABS tablet, TAKE 1 TABLET DAILY BEFORE BREAKFAST, Disp: 90 tablet, Rfl: 3 .  metFORMIN (GLUCOPHAGE-XR) 500 MG 24 hr tablet, TAKE 4  TABLETS DAILY WITH BREAKFAST, Disp: 360 tablet, Rfl: 3 .  OZEMPIC, 1 MG/DOSE, 2 MG/1.5ML SOPN, INJECT 1 MG UNDER THE SKIN ONCE A WEEK, Disp: 9 mL, Rfl: 2 .  pregabalin (LYRICA) 75 MG capsule, Take 1 capsule (75 mg total) by mouth 2 (two) times daily as needed. Nerve pain, Disp: 60 capsule, Rfl: 3 .  Vitamin D, Ergocalciferol, (DRISDOL) 1.25 MG (50000 UNIT) CAPS capsule, Take 1 capsule (50,000 Units total) by mouth every 7 (seven) days., Disp: 4 capsule, Rfl: 0  EXAM: BP Readings from Last 3 Encounters:  11/09/19 105/64  11/02/19 109/76  11/02/19 102/74    VITALS per patient if applicable:  GENERAL: alert, oriented, appears well and in no acute distress  HEENT: atraumatic, conjunttiva clear, no obvious abnormalities on inspection of external nose and ears  NECK: normal movements of the head and neck  LUNGS: on inspection no signs of respiratory distress, breathing rate appears normal, no obvious gross SOB, gasping or wheezing  CV: no obvious cyanosis  MS: moves all visible extremities without noticeable abnormality  PSYCH/NEURO: pleasant and cooperative, no obvious depression or anxiety, speech and thought processing grossly intact Lab Results  Component Value Date   WBC 8.7 02/02/2019   HGB 16.0 02/02/2019   HCT 46.8 02/02/2019   PLT 233.0 02/02/2019   GLUCOSE 99 09/07/2019   CHOL 106 09/07/2019   TRIG 127 09/07/2019   HDL 34 (L) 09/07/2019   LDLDIRECT 125.0 12/26/2018   LDLCALC 49 09/07/2019   ALT 24 09/07/2019   AST 14 09/07/2019   NA 141 09/07/2019   K 4.6 09/07/2019   CL 102 09/07/2019   CREATININE 0.70 (L) 09/07/2019   BUN 18 09/07/2019   CO2 25 09/07/2019   TSH 1.32 04/27/2019   HGBA1C 6.4 (A) 07/27/2019   MICROALBUR 0.8 12/26/2018   Lab Results  Component Value Date   VITAMINB12 237 04/27/2019    ASSESSMENT AND PLAN:  Discussed the following assessment and plan:    ICD-10-CM   1. Right leg pain  M79.604   2. Chronic deep vein thrombosis (DVT) of  right lower extremity, unspecified vein ([name])  I82.501   3. DVT, recurrent, lower extremity, acute, right ([name])  I82.401   4. Medication management  Z79.899   5.  Type 2 diabetes mellitus with other specified complication, with long-term current use of insulin ([name])  E11.69    Z79.4   6.  anticoagulation  Z79.01    Lymphedema  Sp chronic dvt  With sometimes severe  Neuro sounding pain  Can awaken from sleep  Underlying dm controlled     bp on low side no evidence of bleeding or other issues    b12 low normal   Consider if adding supplement could be helpful   Consider Regional pain syndrome like rsd  Send [name] a copy of form and we can extend to 8 days equivalent  Per month  Being in office began  Last week has made more difficult   To  Tend to leg  Counseled.   Expectant management and discussion of plan and treatment with opportunity to ask questions and all were answered. The patient agreed with the plan and demonstrated an understanding of the instructions.   Advised to call back or seek an in-person evaluation if worsening  or having  further concerns . Return for when due  or as indicated .    Shanon Ace, MD

## 2019-11-09 NOTE — Telephone Encounter (Signed)
Patient dropped off FMLA forms  Fax to: [phone]  ATTN: [phone] [id number]  Disposition: Dr's Folder

## 2019-11-13 NOTE — Telephone Encounter (Signed)
FMLA form   Signed and completed   on your desk

## 2019-11-15 ENCOUNTER — Other Ambulatory Visit: Payer: Self-pay | Admitting: Internal Medicine

## 2019-11-15 ENCOUNTER — Other Ambulatory Visit (INDEPENDENT_AMBULATORY_CARE_PROVIDER_SITE_OTHER): Payer: Self-pay | Admitting: Family Medicine

## 2019-11-15 DIAGNOSIS — E11319 Type 2 diabetes mellitus with unspecified diabetic retinopathy without macular edema: Secondary | ICD-10-CM

## 2019-11-15 DIAGNOSIS — E559 Vitamin D deficiency, unspecified: Secondary | ICD-10-CM

## 2019-11-17 NOTE — Telephone Encounter (Signed)
fmla completedlat week

## 2019-11-23 ENCOUNTER — Other Ambulatory Visit: Payer: Self-pay

## 2019-11-23 ENCOUNTER — Encounter (INDEPENDENT_AMBULATORY_CARE_PROVIDER_SITE_OTHER): Payer: Self-pay | Admitting: Family Medicine

## 2019-11-23 ENCOUNTER — Ambulatory Visit (INDEPENDENT_AMBULATORY_CARE_PROVIDER_SITE_OTHER): Payer: BC Managed Care – PPO | Admitting: Family Medicine

## 2019-11-23 VITALS — BP 105/65 | HR 74 | Temp 97.8°F | Ht 71.0 in | Wt 279.0 lb

## 2019-11-23 DIAGNOSIS — E559 Vitamin D deficiency, unspecified: Secondary | ICD-10-CM | POA: Diagnosis not present

## 2019-11-23 DIAGNOSIS — E1169 Type 2 diabetes mellitus with other specified complication: Secondary | ICD-10-CM | POA: Diagnosis not present

## 2019-11-23 DIAGNOSIS — Z9189 Other specified personal risk factors, not elsewhere classified: Secondary | ICD-10-CM

## 2019-11-23 DIAGNOSIS — Z6838 Body mass index (BMI) 38.0-38.9, adult: Secondary | ICD-10-CM

## 2019-11-23 DIAGNOSIS — Z794 Long term (current) use of insulin: Secondary | ICD-10-CM

## 2019-11-23 MED ORDER — VITAMIN D (ERGOCALCIFEROL) 1.25 MG (50000 UNIT) PO CAPS: 50000.0000 [IU] | ORAL_CAPSULE | ORAL | 0 refills | Status: DC

## 2019-11-25 ENCOUNTER — Other Ambulatory Visit: Payer: Self-pay

## 2019-11-25 DIAGNOSIS — E11319 Type 2 diabetes mellitus with unspecified diabetic retinopathy without macular edema: Secondary | ICD-10-CM

## 2019-11-25 MED ORDER — OZEMPIC (1 MG/DOSE) 2 MG/1.5ML ~~LOC~~ SOPN: PEN_INJECTOR | SUBCUTANEOUS | 2 refills | Status: DC

## 2019-11-25 NOTE — Progress Notes (Signed)
Chief Complaint:   OBESITY Troy [name] is here to discuss his progress with his obesity treatment plan along with follow-up of his obesity related diagnoses. Troy [name] is on the Category 3 Plan and states he is following his eating plan approximately 60-70% of the time. Troy [name] states he is exercising for 0 minutes 0 times per week.  Today's visit was #: 6 Starting weight: 293 lbs Starting date: 09/07/2019 Today's weight: 279 lbs Today's date: 11/23/2019 Total lbs lost to date: 14 lbs Total lbs lost since last in-office visit: 0  Interim History: Troy [name] went into work recently.  He said it is difficult to get all foods in.  He skips meals/foods at times.  He only occasionally has cravings, desires sweets.  Hunger is well controlled.  He is following the plan 60-70% of the time.  Subjective:   1. Type 2 diabetes mellitus with other specified complication, with long-term current use of insulin ([name]) Medications reviewed. Diabetic ROS: no polyuria or polydipsia, no chest pain, dyspnea or TIA's, no numbness, tingling or pain in extremities.   Lab Results  Component Value Date   HGBA1C 6.4 (A) 07/27/2019   HGBA1C 7.5 (A) 04/27/2019   HGBA1C 6.7 (A) 12/26/2018   Lab Results  Component Value Date   MICROALBUR 0.8 12/26/2018   LDLCALC 49 09/07/2019   CREATININE 0.70 (L) 09/07/2019   2. Vitamin D deficiency Troy [name]'s Vitamin D level was 9.1 on 09/07/2019. He is currently taking prescription vitamin D 50,000 IU each week. He denies nausea, vomiting or muscle weakness.   Assessment/Plan:   1. Type 2 diabetes mellitus with other specified complication, with long-term current use of insulin ([name]) Troy [name] will continue to work on weight loss, exercise, and decreasing simple carbohydrates to help decrease the risk of diabetes.   2. Vitamin D deficiency Low Vitamin D level contributes to fatigue and are associated with obesity, breast, and colon cancer. He agrees to continue to take  prescription Vitamin D @50 ,000 IU every week and will follow-up for routine testing of Vitamin D, at least 2-3 times per year to avoid over-replacement.  -Refill Vitamin D, Ergocalciferol, (DRISDOL) 1.25 MG (50000 UNIT) CAPS capsule; Take 1 capsule (50,000 Units total) by mouth every 7 (seven) days.  Dispense: 12 capsule; Refill: 0  3. At risk for osteoporosis Troy [name] was given approximately 16 minutes of osteoporosis prevention counseling today.   Troy [name] is at risk for osteopenia and osteoporosis due to Vitamin D deficiency, as well as other risk factors.  We discussed the importance of prudent screenings through his PCP's office for prevention.     Troy [name] was encouraged to take his Vitamin D and follow his calcium rich diet.  We will continue to monitor vitamin D levels to ensure treatment is appropriate.   It is recommended that he eventually engage in weight bearing exercises and muscle strengthening exercises to help improve bone density and decrease his risk of osteopenia and osteoporosis.  4. Class 2 severe obesity with serious comorbidity and body mass index (BMI) of 38.0 to 38.9 in adult, unspecified obesity type Troy [name]) Troy [name] is currently in the action stage of change. As such, his goal is to continue with weight loss efforts. He has agreed to the Category 3 Plan.   Exercise goals: Limited due to pain in right leg from DVT.  Unable to even walk.  Behavioral modification strategies: increasing lean protein intake, decreasing simple carbohydrates, decreasing eating out, avoiding temptations and planning for success.  Troy [name] has agreed to  follow-up with our clinic in 2 weeks. He was informed of the importance of frequent follow-up visits to maximize his success with intensive lifestyle modifications for his multiple health conditions.   Objective:   Blood pressure 105/65, pulse 74, temperature 97.8 F (36.6 C), height 5\' 11"  (1.803 m), weight 279 lb (126.6 kg), SpO2 98 %. Body mass  index is 38.91 kg/m.  General: Cooperative, alert, well developed, in no acute distress. HEENT: Conjunctivae and lids unremarkable. Cardiovascular: Regular rhythm.  Lungs: Normal work of breathing. Neurologic: No focal deficits.   Lab Results  Component Value Date   CREATININE 0.70 (L) 09/07/2019   BUN 18 09/07/2019   NA 141 09/07/2019   K 4.6 09/07/2019   CL 102 09/07/2019   CO2 25 09/07/2019   Lab Results  Component Value Date   ALT 24 09/07/2019   AST 14 09/07/2019   ALKPHOS 76 09/07/2019   BILITOT 0.7 09/07/2019   Lab Results  Component Value Date   HGBA1C 6.4 (A) 07/27/2019   HGBA1C 7.5 (A) 04/27/2019   HGBA1C 6.7 (A) 12/26/2018   HGBA1C 7.3 (A) 09/16/2017   HGBA1C 8.7 06/10/2017   Lab Results  Component Value Date   TSH 1.32 04/27/2019   Lab Results  Component Value Date   CHOL 106 09/07/2019   HDL 34 (L) 09/07/2019   LDLCALC 49 09/07/2019   LDLDIRECT 125.0 12/26/2018   TRIG 127 09/07/2019   CHOLHDL 6 12/26/2018   Lab Results  Component Value Date   WBC 8.7 02/02/2019   HGB 16.0 02/02/2019   HCT 46.8 02/02/2019   MCV 94.8 02/02/2019   PLT 233.0 02/02/2019   Lab Results  Component Value Date   FERRITIN 141.3 07/22/2017   Attestation Statements:   Reviewed by clinician on day of visit: allergies, medications, problem list, medical history, surgical history, family history, social history, and previous encounter notes.  I, Water quality scientist, CMA, am acting as Location manager for [employer], DO.  I have reviewed the above documentation for accuracy and completeness, and I agree with the above. Mellody Dance, DO

## 2019-11-27 ENCOUNTER — Telehealth: Payer: Self-pay | Admitting: Family Medicine

## 2019-11-27 MED ORDER — PREGABALIN 75 MG PO CAPS
75.0000 mg | ORAL_CAPSULE | Freq: Two times a day (BID) | ORAL | 3 refills | Status: DC | PRN
Start: 2019-11-27 — End: 2020-11-18

## 2019-11-27 NOTE — Telephone Encounter (Signed)
Received request for 90-day supply of Lyrica sent to [facility].  Medication prescribed.

## 2019-11-30 ENCOUNTER — Other Ambulatory Visit: Payer: Self-pay

## 2019-11-30 ENCOUNTER — Encounter: Payer: Self-pay | Admitting: Internal Medicine

## 2019-11-30 ENCOUNTER — Ambulatory Visit (INDEPENDENT_AMBULATORY_CARE_PROVIDER_SITE_OTHER): Payer: BC Managed Care – PPO | Admitting: Internal Medicine

## 2019-11-30 VITALS — BP 120/70 | HR 83 | Ht 71.0 in | Wt 283.0 lb

## 2019-11-30 DIAGNOSIS — E785 Hyperlipidemia, unspecified: Secondary | ICD-10-CM

## 2019-11-30 DIAGNOSIS — E113313 Type 2 diabetes mellitus with moderate nonproliferative diabetic retinopathy with macular edema, bilateral: Secondary | ICD-10-CM | POA: Diagnosis not present

## 2019-11-30 DIAGNOSIS — E11319 Type 2 diabetes mellitus with unspecified diabetic retinopathy without macular edema: Secondary | ICD-10-CM | POA: Diagnosis not present

## 2019-11-30 DIAGNOSIS — R7989 Other specified abnormal findings of blood chemistry: Secondary | ICD-10-CM

## 2019-11-30 DIAGNOSIS — H43823 Vitreomacular adhesion, bilateral: Secondary | ICD-10-CM | POA: Diagnosis not present

## 2019-11-30 DIAGNOSIS — H35033 Hypertensive retinopathy, bilateral: Secondary | ICD-10-CM | POA: Diagnosis not present

## 2019-11-30 DIAGNOSIS — Z794 Long term (current) use of insulin: Secondary | ICD-10-CM

## 2019-11-30 DIAGNOSIS — Z6841 Body Mass Index (BMI) 40.0 and over, adult: Secondary | ICD-10-CM

## 2019-11-30 LAB — POCT GLYCOSYLATED HEMOGLOBIN (HGB A1C): Hemoglobin A1C: 5.9 % — AB (ref 4.0–5.6)

## 2019-11-30 MED ORDER — V-GO 20 KIT: PACK | 3 refills | Status: DC

## 2019-11-30 NOTE — Addendum Note (Signed)
Addended by: Cardell Peach I on: 11/30/2019 09:18 AM   Modules accepted: Orders

## 2019-11-30 NOTE — Progress Notes (Signed)
Subjective:     Patient ID: Troy [name], male   DOB: [birth year], 49 y.o.   MRN: [id number]  This visit occurred during the SARS-CoV-2 public health emergency.  Safety protocols were in place, including screening questions prior to the visit, additional usage of staff PPE, and extensive cleaning of exam room while observing appropriate contact time as indicated for disinfecting solutions.   HPI Troy [name] is a pleasant 49 y.o. man, returning for management of DM2, dx 2012, insulin-dependent, with complications (DR), uncontrolled.  Last visit 4 months ago.  Sugars started to improve after switching from night shift to day shift few months ago.  [facility] >> lost 20 lbs per his scale at home!  Reviewed HbA1c levels: Lab Results  Component Value Date   HGBA1C 6.4 (A) 07/27/2019   HGBA1C 7.5 (A) 04/27/2019   HGBA1C 6.7 (A) 12/26/2018   He is on: - Metformin XR 2000 mg in a.m. - Invokana 100 mg before breakfast - VGo 40 >> 30: 2-3 clicks per meal >> 6 clicks per meal - Ozempic 0.5 mg weekly-started 06/2017 >> 1 mg weekly He was on Victoza before >> no SEs.   He checks his sugars more than 4 times a day with his CGM: Freestyle libre CGM parameters: - Average: 160 >> 143 >> 115 - % active CGM time: 30% >> 60% >> 27% of the time - Glucose variability 25.5% >> 25.1% >> 22.9% (target < or = to 36%) - time in range:  - very low (<54): 0% >> 0% >> 0% - low (54-69): 0% >> 0% >>  0% - normal range (70-180): 72% >> 85% >> 98% - high sugars (181-250): 26% >> 13% >> 1% - very high sugars (>250): 2% >> 2% >> 0%    Previously:  Previously:   Lowest: 113 >> 77 >> 78 >> 105 >> 60s >> 50s Highest: 300s >> 243 >> 200s >> 285 >> 230  -No CKD: Lab Results  Component Value Date   BUN 18 09/07/2019   BUN 13 02/02/2019   CREATININE 0.70 (L) 09/07/2019   CREATININE 0.72 02/02/2019   No MAU: Lab Results  Component Value Date   MICRALBCREAT 0.5  12/26/2018   MICRALBCREAT 1.4 07/22/2017   MICRALBCREAT 2.2 12/18/2016   MICRALBCREAT 5.8 10/18/2015   MICRALBCREAT 2.0 03/16/2013   MICRALBCREAT 1.6 11/06/2012   MICRALBCREAT 2.5 07/31/2012   MICRALBCREAT 3.6 11/26/2011   MICRALBCREAT 2.3 11/28/2010   MICRALBCREAT 0.6 07/29/2009  He is not on ACE inhibitor/ARB. -+ HL: Lab Results  Component Value Date   CHOL 106 09/07/2019   HDL 34 (L) 09/07/2019   LDLCALC 49 09/07/2019   LDLDIRECT 125.0 12/26/2018   TRIG 127 09/07/2019   CHOLHDL 6 12/26/2018  On Lipitor 20. - Last eye exam was in 04/2019: + DR.  He sees a retina specialist.. -No numbness and tingling in feet - sees podiatry.  On gabapentin  He has OSA and is compliant with his CPAP.  He had a DVT in R leg in 04/2015.  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  He noticed that sugars were higher after he started Eliquis. He lost 45 lbs before 2017 - mostly vegan food.   He has a history of ADD. He did see urology before and was given Viagra, but causes for ED were not explored. At last visit, per his request, we checked his testosterone level.  This returned mildly low: Component     Latest Ref Rng & Units 04/27/2019  Testosterone, Serum (Total)     ng/dL 262 (L)  % Free Testosterone     % 1.9  Free Testosterone, S     pg/mL 50 (L)  Sex Hormone Binding Globulin     nmol/L 34.5   Reviewed B12 levels: Lab Results  Component Value Date   VITAMINB12 237 04/27/2019   VITAMINB12 236 08/19/2017  On 1000 mcg daily.  Review of Systems Constitutional: no weight gain/no weight loss, no fatigue, no subjective hyperthermia, no subjective hypothermia Eyes: no blurry vision, no xerophthalmia ENT: no sore throat, no nodules palpated in neck, no dysphagia, no odynophagia, no hoarseness Cardiovascular: no CP/no SOB/no palpitations/+ chronic right leg swelling Respiratory: no cough/no SOB/no wheezing Gastrointestinal: no N/no V/no D/no C/no  acid reflux Musculoskeletal: no muscle aches/no joint aches Skin: + Rash right shin, no hair loss Neurological: + R hand tremors/no numbness/no tingling/no dizziness  I reviewed pt's medications, allergies, PMH, social hx, family hx, and changes were documented in the history of present illness. Otherwise, unchanged from my initial visit note.  Past Medical History:  Diagnosis Date  . Arm weakness    right   . Asthma   . B12 deficiency   . Back pain   . Diabetes insipidus ([place])   . Diabetes mellitus without complication ([place])   . DVT (deep venous thrombosis) ([place])   . Leg edema, right   . Male infertility   . Obesity   . Obesity   . OSA (obstructive sleep apnea)    Past Surgical History:  Procedure Laterality Date  . BACK SURGERY    . CERVICAL DISC SURGERY     C6/C7 2009  . cubital tunnel left arm     2003  . ELBOW SURGERY    . FIBULA FRACTURE SURGERY     plate & pin removed due to infection 1997  . ULNAR NERVE REPAIR     Social History   Socioeconomic History  . Marital status: Married    Spouse name: Juliann Pulse  . Number of children: 1  . Years of education: Not on file  . Highest education level: Not on file  Occupational History  . Occupation: Therapist, [employer]  Tobacco Use  . Smoking status: Never Smoker  . Smokeless tobacco: Never Used  Vaping Use  . Vaping Use: Never used  Substance and Sexual Activity  . Alcohol use: Yes    Comment: 1 drink mo.  . Drug use: No  . Sexual activity: Yes  Other Topics Concern  . Not on file  Social History Narrative   Occupation:  [employer] 11- 8 am  Now changed job and shift to [employer] through Thursday    Working  Ft 40 hours mostly   Married '10 10 10   ' Wife s/p bariatric surgery pt   Regular exercise- yes   Pt does have children   Father died suddenly 2008-05-12   Mom passes 2017  Acute rep disease after acute renal failure   Daily caffeine use one a day   2 dogs and cat.             Social  Determinants of Health   Financial Resource Strain:   . Difficulty of Paying Living Expenses: Not on file  Food Insecurity:   . Worried About Charity fundraiser in the Last Year: Not on file  . Ran  Out of Food in the Last Year: Not on file  Transportation Needs:   . Lack of Transportation (Medical): Not on file  . Lack of Transportation (Non-Medical): Not on file  Physical Activity:   . Days of Exercise per Week: Not on file  . Minutes of Exercise per Session: Not on file  Stress:   . Feeling of Stress : Not on file  Social Connections:   . Frequency of Communication with Friends and Family: Not on file  . Frequency of Social Gatherings with Friends and Family: Not on file  . Attends Religious Services: Not on file  . Active Member of Clubs or Organizations: Not on file  . Attends Archivist Meetings: Not on file  . Marital Status: Not on file  Intimate Partner Violence:   . Fear of Current or Ex-Partner: Not on file  . Emotionally Abused: Not on file  . Physically Abused: Not on file  . Sexually Abused: Not on file   Current Outpatient Medications on File Prior to Visit  Medication Sig Dispense Refill  . albuterol (PROVENTIL HFA;VENTOLIN HFA) 108 (90 Base) MCG/ACT inhaler Inhale 2 puffs into the lungs every 6 (six) hours as needed. For wheezing 1 Inhaler 2  . apixaban (ELIQUIS) 5 MG TABS tablet TAKE 1 TABLET(5 MG) BY MOUTH TWICE DAILY 180 tablet 1  . atorvastatin (LIPITOR) 40 MG tablet TAKE 1 TABLET(40 MG) BY MOUTH DAILY 90 tablet 2  . baclofen (LIORESAL) 10 MG tablet TAKE 1 TABLET(10 MG) BY MOUTH THREE TIMES DAILY AS NEEDED 40 tablet 0  . Continuous Blood Gluc Sensor (FREESTYLE LIBRE 14 DAY SENSOR) MISC 1 EACH BY DOES NOT APPLY ROUTE EVERY 14 (FOURTEEN) DAYS. CHANGE EVERY 2 WEEKS 2 each 11  . HUMALOG 100 UNIT/ML injection INJECT UP TO 80 UNITS DAILY IN VGO. PLEASE MAKE APPOINTMENT 90 mL 3  . Insulin Disposable Pump (V-GO 30) KIT USE 1 TIME A DAY.   MUST HAVE  APPOINTMENT FOR FURTHER REFILLS 90 kit 3  . INVOKANA 100 MG TABS tablet TAKE 1 TABLET DAILY BEFORE BREAKFAST 90 tablet 3  . metFORMIN (GLUCOPHAGE-XR) 500 MG 24 hr tablet TAKE 4 TABLETS DAILY WITH BREAKFAST 360 tablet 3  . pregabalin (LYRICA) 75 MG capsule Take 1 capsule (75 mg total) by mouth 2 (two) times daily as needed. Nerve pain 180 capsule 3  . Semaglutide, 1 MG/DOSE, (OZEMPIC, 1 MG/DOSE,) 2 MG/1.5ML SOPN INJECT 1 MG UNDER THE SKIN ONCE A WEEK 9 mL 2  . Vitamin D, Ergocalciferol, (DRISDOL) 1.25 MG (50000 UNIT) CAPS capsule Take 1 capsule (50,000 Units total) by mouth every 7 (seven) days. 12 capsule 0   No current facility-administered medications on file prior to visit.   No Known Allergies Family History  Problem Relation Age of Onset  . Heart disease Mother   . Other Mother        clotting disorders  . Diabetes Mother   . High blood pressure Mother   . Kidney disease Mother   . Depression Mother   . Sleep apnea Mother   . Obesity Mother   . Other Father        sudden death/cervical and lumbar disk disease  . Aneurysm Father        In his 58s smoked  . Hyperlipidemia Father   . High blood pressure Father   . Sudden death Father   . Alcoholism Father   . Hypertension Other   . Allergies Other   . Deep vein  thrombosis Other   . Sleep apnea Other   . Obesity Other     Objective:   Physical Exam BP 120/70   Pulse 83   Ht '5\' 11"'  (1.803 m)   Wt 283 lb (128.4 kg)   SpO2 97%   BMI 39.47 kg/m  Body mass index is 39.47 kg/m. Wt Readings from Last 3 Encounters:  11/30/19 283 lb (128.4 kg)  11/23/19 279 lb (126.6 kg)  11/09/19 284 lb (128.8 kg)   Constitutional: overweight, in NAD Eyes: PERRLA, EOMI, no exophthalmos ENT: moist mucous membranes, no thyromegaly, no cervical lymphadenopathy Cardiovascular: RRR, No MRG, + RLE swelling -chronic, after his DVT - leg wrapped Respiratory: CTA B Gastrointestinal: abdomen soft, NT, ND, BS+ Musculoskeletal: no deformities,  strength intact in all 4 Skin: moist, warm, no rashes Neurological: + tremor with R outstretched hand, DTR normal in all 4  Assessment:     1. DM2, insulin-dependent, uncontrolled, with complications - DR  2. Obesity class 3 BMI Classification:  < 18.5 underweight   18.5-24.9 normal weight   25.0-29.9 overweight   30.0-34.9 class I obesity   35.0-39.9 class II obesity   ? 40.0 class III obesity   3. HL  4.  Low T  Plan:     1. Patient with history of uncontrolled diabetes, on oral antidiabetic regimen with Metformin and SGLT2 inhibitor, also weekly GLP-1 receptor agonist and VGo patch pump.  He had worse control at the beginning of the year after his episode of DVT and starting back on Eliquis.  At that time, he was also running out of the insulin the pump and we discussed about changing it every night as he was having high blood sugars in the morning.  At last visit, however, sugars are much better and he was setting alarms to change his pump.  Sugars were usually in the nineties to one hundreds in the morning and lower than two hundreds after meals.  We did not change his regimen at that time. CGM interpretation: -At today's visit, his sugars appear much improved throughout the day.  He is glucose levels are 98% in the normal range, which is excellent.  He has no lows or significant highs.  He has more variability in his sugars after dinner, per review of the CGM tracings.  Upon questioning, he is mostly eating a carb free dinner and I advised him that for these meals, she will need to reduce the dose of Humalog and may even be able to get by without using any Humalog.   -She is average glucose level is 115, which is again excellent.  His glucose variability is not very high, at 22.9%. -Since he also describes occasional low blood sugars especially during the night, we will decrease the amount of basal insulin he is receiving by changing to [place].  Also, I advised him to  experience to not use Humalog with some meals.  At next visit, we may even stop insulin completely if he continues to do this well. - I advised him to: Patient Instructions  Please continue: - Metformin XR 2000 mg at dinnertime - Invokana 100 mg before b'fast  - Ozempic 1 mg weekly  Please change: - [id number]: 4-6 clicks per meal  Please return in 4 months.   - we checked his HbA1c: 5.9% (excellent) - advised to check sugars at different times of the day - 4x a day, rotating check times - advised for yearly eye exams >> he is UTD -  return to clinic in 4 months    2. Obesity class 3 -Continue Ozempic and Invokana which should also help with weight loss -He is now in the program at the weight management clinic -Before last visit he gained 13 pounds -She lost 14 pounds since last visit!  3. HL -Reviewed latest lipid panel from 08/2019: LDL improved significantly, HDL low:: Lab Results  Component Value Date   CHOL 106 09/07/2019   HDL 34 (L) 09/07/2019   LDLCALC 49 09/07/2019   LDLDIRECT 125.0 12/26/2018   TRIG 127 09/07/2019   CHOLHDL 6 12/26/2018  -Continues Lipitor without side effects  4. Low testosterone -Patient had a testosterone checked due to complaints of ED and the free testosterone was mildly low -He saw urology in the past -We also discussed about referral to weight management since obesity can cause slightly low testosterone.  He is very successful in losing the weight.  He may need a repeat of his testosterone level in the near future.  Philemon Kingdom, MD PhD [facility] Endocrinology

## 2019-11-30 NOTE — Patient Instructions (Addendum)
Please continue: - Metformin XR 2000 mg at dinnertime - Invokana 100 mg before b'fast  - Ozempic 1 mg weekly  Please change: - [id number]: 4-6 clicks per meal  Please return in 4 months.

## 2019-12-07 ENCOUNTER — Ambulatory Visit (INDEPENDENT_AMBULATORY_CARE_PROVIDER_SITE_OTHER): Payer: BC Managed Care – PPO | Admitting: Family Medicine

## 2019-12-07 ENCOUNTER — Other Ambulatory Visit: Payer: Self-pay

## 2019-12-07 ENCOUNTER — Ambulatory Visit: Payer: BC Managed Care – PPO | Admitting: Family Medicine

## 2019-12-07 ENCOUNTER — Encounter (INDEPENDENT_AMBULATORY_CARE_PROVIDER_SITE_OTHER): Payer: Self-pay | Admitting: Family Medicine

## 2019-12-07 VITALS — BP 103/70 | HR 94 | Temp 98.1°F | Ht 71.0 in | Wt 278.2 lb

## 2019-12-07 DIAGNOSIS — E1169 Type 2 diabetes mellitus with other specified complication: Secondary | ICD-10-CM

## 2019-12-07 DIAGNOSIS — I890 Lymphedema, not elsewhere classified: Secondary | ICD-10-CM | POA: Diagnosis not present

## 2019-12-07 DIAGNOSIS — Z6838 Body mass index (BMI) 38.0-38.9, adult: Secondary | ICD-10-CM

## 2019-12-07 DIAGNOSIS — Z794 Long term (current) use of insulin: Secondary | ICD-10-CM

## 2019-12-07 DIAGNOSIS — Z9189 Other specified personal risk factors, not elsewhere classified: Secondary | ICD-10-CM

## 2019-12-07 NOTE — Progress Notes (Deleted)
   I, Wendy Poet, LAT, ATC, am serving as scribe for Dr. [name].  Troy [name] is a 49 y.o. male who presents to [place] [facility] today for R leg/knee pain and swelling.  He was last seen by Dr. [name] on 11/02/19 and was referred to a lymphadema specialist to assist w/ swelling reduction and prescribed Lyrica.  He has a hx of 3 prior DVTs in his R LE w/ his most recent being on 04/21/19.  Since his last visit, pt reports   Diagnostic testing: R knee and R tib/fib XR- 09/17/19   Pertinent review of systems: ***  Relevant historical information: ***   Exam:  There were no vitals taken for this visit. General: Well Developed, well nourished, and in no acute distress.   MSK: ***    Lab and Radiology Results No results found for this or any previous visit (from the past 72 hour(s)). No results found.     Assessment and Plan: 49 y.o. male with ***   PDMP not reviewed this encounter. No orders of the defined types were placed in this encounter.  No orders of the defined types were placed in this encounter.    Discussed warning signs or symptoms. Please see discharge instructions. Patient expresses understanding.   ***

## 2019-12-09 NOTE — Progress Notes (Signed)
Chief Complaint:   OBESITY Troy [name] is here to discuss his progress with his obesity treatment plan along with follow-up of his obesity related diagnoses. Troy [name] is on the Category 3 Plan and states he is following his eating plan approximately 90% of the time. Troy [name] states he is exercising for 0 minutes 0 times per week.  Today's visit was #: 7 Starting weight: 293 lbs Starting date: 09/07/2019 Today's weight: 278 lbs Today's date: 12/07/2019 Total lbs lost to date: 15 lbs Total lbs lost since last in-office visit: 1 lb  Interim History: Troy [name] says he is eating [employer] for snacks (180 calories, 20 grams of protein) plus chocolate [place] ice cream bars at night.  He is getting in 9 ounces of protein on average.  He had [place] takeout one night.  He is eating eggs in the morning and a sandwich at lunch.  He recently obtained a Francia Greaves for his right lower extremity edema.  He has much less pain and thinks he is able to walk now.  His pain is now 80-90% less than prior.  He drinks 128 ounces of water per day.  Subjective:   1. Type 2 diabetes mellitus with other specified complication, with long-term current use of insulin ([name]) Medications reviewed. Diabetic ROS: no polyuria or polydipsia, no chest pain, dyspnea or TIA's, no numbness, tingling or pain in extremities.  Decrease insulin from 30 units per day to 20 units per day.  He is on insulin, metformin, Invokana.  Treatment per Dr. Cruzita Lederer of Endocrinology.  Denies lows.  Blood sugars in the 110s mostly.  Lab Results  Component Value Date   HGBA1C 5.9 (A) 11/30/2019   HGBA1C 6.4 (A) 07/27/2019   HGBA1C 7.5 (A) 04/27/2019   Lab Results  Component Value Date   MICROALBUR 0.8 12/26/2018   LDLCALC 49 09/07/2019   CREATININE 0.70 (L) 09/07/2019   2. At risk for hypoglycemia Troy [name] is at increased risk for hypoglycemia due to changes in diet, diagnosis of diabetes, and/or insulin use. Troy [name] is currently taking insulin.    Assessment/Plan:   1. Type 2 diabetes mellitus with other specified complication, with long-term current use of insulin ([name]) Good blood sugar control is important to decrease the likelihood of diabetic complications such as nephropathy, neuropathy, limb loss, blindness, coronary artery disease, and death. Intensive lifestyle modification including diet, exercise and weight loss are the first line of treatment for diabetes. Continue medications per Endocrinology and continue vigilant monitoring of blood sugars.  2. At risk for hypoglycemia Troy [name] was given approximately 15 minutes of counseling today regarding prevention of hypoglycemia. He was advised of symptoms of hypoglycemia. Troy [name] was instructed to avoid skipping meals, eat regular protein rich meals and schedule low calorie snacks as needed.   3. Class 2 severe obesity with serious comorbidity and body mass index (BMI) of 38.0 to 38.9 in adult, unspecified obesity type [name])  Troy [name] is currently in the action stage of change. As such, his goal is to continue with weight loss efforts. He has agreed to the Category 3 Plan.   Exercise goals: Start walking for 5-10 minutes per day.  Behavioral modification strategies: increasing lean protein intake, meal planning and cooking strategies and planning for success.  Troy [name] has agreed to follow-up with our clinic in 2 weeks. He was informed of the importance of frequent follow-up visits to maximize his success with intensive lifestyle modifications for his multiple health conditions.   Objective:   Blood pressure 103/70,  pulse 94, temperature 98.1 F (36.7 C), height 5\' 11"  (1.803 m), weight 278 lb 3.2 oz (126.2 kg), SpO2 97 %. Body mass index is 38.8 kg/m.  General: Cooperative, alert, well developed, in no acute distress. HEENT: Conjunctivae and lids unremarkable. Cardiovascular: Regular rhythm.  Lungs: Normal work of breathing. Neurologic: No focal deficits.   Lab Results    Component Value Date   CREATININE 0.70 (L) 09/07/2019   BUN 18 09/07/2019   NA 141 09/07/2019   K 4.6 09/07/2019   CL 102 09/07/2019   CO2 25 09/07/2019   Lab Results  Component Value Date   ALT 24 09/07/2019   AST 14 09/07/2019   ALKPHOS 76 09/07/2019   BILITOT 0.7 09/07/2019   Lab Results  Component Value Date   HGBA1C 5.9 (A) 11/30/2019   HGBA1C 6.4 (A) 07/27/2019   HGBA1C 7.5 (A) 04/27/2019   HGBA1C 6.7 (A) 12/26/2018   HGBA1C 7.3 (A) 09/16/2017   Lab Results  Component Value Date   TSH 1.32 04/27/2019   Lab Results  Component Value Date   CHOL 106 09/07/2019   HDL 34 (L) 09/07/2019   LDLCALC 49 09/07/2019   LDLDIRECT 125.0 12/26/2018   TRIG 127 09/07/2019   CHOLHDL 6 12/26/2018   Lab Results  Component Value Date   WBC 8.7 02/02/2019   HGB 16.0 02/02/2019   HCT 46.8 02/02/2019   MCV 94.8 02/02/2019   PLT 233.0 02/02/2019   Lab Results  Component Value Date   FERRITIN 141.3 07/22/2017   Attestation Statements:   Reviewed by clinician on day of visit: allergies, medications, problem list, medical history, surgical history, family history, social history, and previous encounter notes.  I, Water quality scientist, CMA, am acting as Location manager for [employer], DO.  I have reviewed the above documentation for accuracy and completeness, and I agree with the above. Troy Dance, DO

## 2019-12-14 ENCOUNTER — Ambulatory Visit (INDEPENDENT_AMBULATORY_CARE_PROVIDER_SITE_OTHER): Payer: BC Managed Care – PPO | Admitting: Family Medicine

## 2019-12-14 ENCOUNTER — Encounter: Payer: Self-pay | Admitting: Family Medicine

## 2019-12-14 ENCOUNTER — Other Ambulatory Visit: Payer: Self-pay

## 2019-12-14 ENCOUNTER — Ambulatory Visit (INDEPENDENT_AMBULATORY_CARE_PROVIDER_SITE_OTHER): Payer: BC Managed Care – PPO

## 2019-12-14 VITALS — BP 122/70 | HR 82 | Ht 71.0 in | Wt 284.0 lb

## 2019-12-14 DIAGNOSIS — M62838 Other muscle spasm: Secondary | ICD-10-CM

## 2019-12-14 DIAGNOSIS — M5412 Radiculopathy, cervical region: Secondary | ICD-10-CM

## 2019-12-14 DIAGNOSIS — I890 Lymphedema, not elsewhere classified: Secondary | ICD-10-CM | POA: Diagnosis not present

## 2019-12-14 DIAGNOSIS — M542 Cervicalgia: Secondary | ICD-10-CM | POA: Diagnosis not present

## 2019-12-14 NOTE — Progress Notes (Signed)
Troy [name], am serving as a Education administrator for Dr. [name].  Troy [name] is a 49 y.o. male who presents to [place] [facility] today for f/u of R knee and lower leg pain and swelling.  He was last seen by Dr. [name] on 11/02/19 and was referred to outpatient lymphadema therapy and prescribed Lyrica.  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  Since his last visit, pt reports he is wearing a lymphedema brace. Has only called out once since wearing it and the pain is better is able to sleep.   Neck pain on L side and cannot turn his head to the left. States he was just nodding his head that causes shooting pain down the R arm and since then his L neck has been in pain.  He already takes Lyrica which she does not think is helped all that much.  Diagnostic testing: R knee XR- 11/02/19; R tib/fib XR- 09/17/19; R LE venous doppler US- 04/21/19   Pertinent review of systems: No fevers or chills  Relevant historical information: History cervical fusion at C7-T1 2008.  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  Resulting weakness and numbness right arm Diabetes.  A1c currently less than 6. 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  Exam:  BP 122/70 (BP Location: Left Arm, Patient Position: Sitting, Cuff Size: Normal)   Pulse 82   Ht 5\' 11"  (1.803 m)   Wt 284 lb (128.8 kg)   SpO2 98%   BMI 39.61 kg/m  General: Well Developed, well nourished, and in no acute distress.   MSK: C-spine normal-appearing nontender midline.  Tender palpation left cervical paraspinal musculature.  Decreased cervical motion especially to left rotation and left lateral flexion. Upper extremity strength equal normal throughout bilateral extremity exception of right hand which has weakness to finger abduction and grip. Reflexes intact bilateral upper extremities.  Sensation intact throughout except right ulna hand.  Right leg wearing  lymphedema straps   Lab and Radiology Results  X-ray images C-spine obtained today personally independently interpreted.  Images compared to CT scan 2015 cervical spine. Loss of cervical lordosis.  Anterior fusion plate at W5-Y0 with good fusion.  Mild neuroforaminal stenosis bilaterally at C6-C7 Await formal radiology review   Assessment and Plan: 49 y.o. male with left lateral neck pain and right cervical radiculopathy.  Neck pain due to muscle spasm and dysfunction.  Right cervical radiculopathy also present.  Dermatomal pattern difficult to tell due to history of prior ulnar nerve injury.  Discussed options.  Would like to avoid steroids and already on Lyrica.  Plan for x-ray today and physical therapy.  If not better would plan for MRI for epidural steroid injection.  Patient does take Eliquis so would have to stop that for MRI. Discussed possibility of Lyrica and prednisone dosing and need to stop Eliquis with an epidural steroid injection.     Lymphedema: Much improved with appropriate treatment including with edema straps.  Watchful waiting.  PDMP not reviewed this encounter. Orders Placed This Encounter  Procedures  . DG Cervical Spine Complete    Standing Status:   Future    Standing Expiration Date:   12/13/2020    Order Specific Question:   Reason for Exam (SYMPTOM  OR DIAGNOSIS REQUIRED)    Answer:   eval neck pain and right arm pain    Order Specific  Question:   Preferred imaging location?    Answer:   [facility]  . Ambulatory referral to Physical Therapy    Referral Priority:   Routine    Referral Type:   Physical Medicine    Referral Reason:   Specialty Services Required    Requested Specialty:   Physical Therapy   No orders of the defined types were placed in this encounter.    Discussed warning signs or symptoms. Please see discharge instructions. Patient expresses understanding.   The above documentation has been reviewed and is accurate and  complete [name], M.D.

## 2019-12-14 NOTE — Progress Notes (Signed)
X-ray cervical spine shows well-appearing surgical hardware at C7-T1.  No fracture.  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

## 2019-12-14 NOTE — Patient Instructions (Addendum)
Thank you for coming in today.  Please get an Xray today before you leave  I've referred you to Physical Therapy.  Let us know if you don't hear from them in one week.  Continue lyrica.   If not better we will do a MRI.    Cervical Radiculopathy  Cervical radiculopathy happens when a nerve in the neck (a cervical nerve) is pinched or bruised. This condition can happen because of an injury to the cervical spine (vertebrae) in the neck, or as part of the normal aging process. Pressure on the cervical nerves can cause pain or numbness that travels from the neck all the way down into the arm and fingers. Usually, this condition gets better with rest. Treatment may be needed if the condition does not improve. What are the causes? This condition may be caused by:  A neck injury.  A bulging (herniated) disk.  Muscle spasms.  Muscle tightness in the neck because of overuse.  Arthritis.  Breakdown or degeneration in the bones and joints of the spine (spondylosis) due to aging.  Bone spurs that may develop near the cervical nerves. What are the signs or symptoms? Symptoms of this condition include:  Pain. The pain may travel from the neck to the arm and hand. The pain can be severe or irritating. It may be worse when you move your neck.  Numbness or tingling in your arm or hand.  Weakness in the affected arm and hand, in severe cases. How is this diagnosed? This condition may be diagnosed based on your symptoms, your medical history, and a physical exam. You may also have tests, including:  X-rays.  A CT scan.  An MRI.  An electromyogram (EMG).  Nerve conduction tests. How is this treated? In many cases, treatment is not needed for this condition. With rest, the condition usually gets better over time. If treatment is needed, options may include:  Wearing a soft neck collar (cervical collar) for short periods of time, as told by your health care provider.  Doing physical  therapy to strengthen your neck muscles.  Taking medicines, such as NSAIDs or oral corticosteroids.  Having spinal injections, in severe cases.  Having surgery. This may be needed if other treatments do not help. Different types of surgery may be done depending on the cause of this condition. Follow these instructions at home: If you have a cervical collar:  Wear it as told by your health care provider. Remove it only as told by your health care provider.  Ask your health care provider if you can remove the collar for cleaning and bathing. If you are allowed to remove the collar for cleaning or bathing: ? Follow instructions from your health care provider about how to remove the collar safely. ? Clean the collar by wiping it with mild soap and water and drying it completely. ? Take out any removable pads in the collar every 1-2 days, and wash them by hand with soap and water. Let them air-dry completely before you put them back in the collar. ? Check your skin under the collar for irritation or sores. If you see any, tell your health care provider. Managing pain      Take over-the-counter and prescription medicines only as told by your health care provider.  If directed, put ice on the affected area. ? If you have a soft neck collar, remove it as told by your health care provider. ? Put ice in a plastic bag. ? Place a  towel between your skin and the bag. ? Leave the ice on for 20 minutes, 2-3 times a day.  If applying ice does not help, you can try using heat. Use the heat source that your health care provider recommends, such as a moist heat pack or a heating pad. ? Place a towel between your skin and the heat source. ? Leave the heat on for 20-30 minutes. ? Remove the heat if your skin turns bright red. This is especially important if you are unable to feel pain, heat, or cold. You may have a greater risk of getting burned.  Try a gentle neck and shoulder massage to help relieve  symptoms. Activity  Rest as needed.  Return to your normal activities as told by your health care provider. Ask your health care provider what activities are safe for you.  Do stretching and strengthening exercises as told by your health care provider or physical therapist.  Do not lift anything that is heavier than 10 lb (4.5 kg) until your health care provider tells you that it is safe. General instructions  Use a flat pillow when you sleep.  Do not drive while wearing a cervical collar. If you do not have a cervical collar, ask your health care provider if it is safe to drive while your neck heals.  Ask your health care provider if the medicine prescribed to you requires you to avoid driving or using heavy machinery.  Do not use any products that contain nicotine or tobacco, such as cigarettes, e-cigarettes, and chewing tobacco. These can delay healing. If you need help quitting, ask your health care provider.  Keep all follow-up visits as told by your health care provider. This is important. Contact a health care provider if:  Your condition does not improve with treatment. Get help right away if:  Your pain gets much worse and cannot be controlled with medicines.  You have weakness or numbness in your hand, arm, face, or leg.  You have a high fever.  You have a stiff, rigid neck.  You lose control of your bowels or your bladder (have incontinence).  You have trouble with walking, balance, or speaking. Summary  Cervical radiculopathy happens when a nerve in the neck is pinched or bruised.  A nerve can get pinched from a bulging disk, arthritis, muscle spasms, or an injury to the neck.  Symptoms include pain, tingling, or numbness radiating from the neck into the arm or hand. Weakness can also occur in severe cases.  Treatment may include rest, wearing a cervical collar, and physical therapy. Medicines may be prescribed to help with pain. In severe cases, injections or  surgery may be needed. This information is not intended to replace advice given to you by your health care provider. Make sure you discuss any questions you have with your health care provider. Document Revised: 01/17/2018 Document Reviewed: 01/17/2018 Elsevier Patient Education  2020 [employer].

## 2019-12-21 ENCOUNTER — Other Ambulatory Visit: Payer: Self-pay

## 2019-12-21 ENCOUNTER — Ambulatory Visit (INDEPENDENT_AMBULATORY_CARE_PROVIDER_SITE_OTHER): Payer: BC Managed Care – PPO | Admitting: Adult Health

## 2019-12-21 ENCOUNTER — Encounter (INDEPENDENT_AMBULATORY_CARE_PROVIDER_SITE_OTHER): Payer: Self-pay | Admitting: Adult Health

## 2019-12-21 VITALS — BP 119/71 | HR 73 | Temp 97.8°F | Ht 71.0 in | Wt 278.0 lb

## 2019-12-21 DIAGNOSIS — E1169 Type 2 diabetes mellitus with other specified complication: Secondary | ICD-10-CM | POA: Diagnosis not present

## 2019-12-21 DIAGNOSIS — I152 Hypertension secondary to endocrine disorders: Secondary | ICD-10-CM | POA: Diagnosis not present

## 2019-12-21 DIAGNOSIS — Z9189 Other specified personal risk factors, not elsewhere classified: Secondary | ICD-10-CM

## 2019-12-21 DIAGNOSIS — E785 Hyperlipidemia, unspecified: Secondary | ICD-10-CM

## 2019-12-21 DIAGNOSIS — E1159 Type 2 diabetes mellitus with other circulatory complications: Secondary | ICD-10-CM | POA: Diagnosis not present

## 2019-12-21 DIAGNOSIS — E786 Lipoprotein deficiency: Secondary | ICD-10-CM

## 2019-12-21 DIAGNOSIS — E559 Vitamin D deficiency, unspecified: Secondary | ICD-10-CM | POA: Diagnosis not present

## 2019-12-21 DIAGNOSIS — E1165 Type 2 diabetes mellitus with hyperglycemia: Secondary | ICD-10-CM

## 2019-12-21 DIAGNOSIS — I890 Lymphedema, not elsewhere classified: Secondary | ICD-10-CM | POA: Diagnosis not present

## 2019-12-21 DIAGNOSIS — Z6838 Body mass index (BMI) 38.0-38.9, adult: Secondary | ICD-10-CM

## 2019-12-21 DIAGNOSIS — Z794 Long term (current) use of insulin: Secondary | ICD-10-CM

## 2019-12-22 DIAGNOSIS — E785 Hyperlipidemia, unspecified: Secondary | ICD-10-CM | POA: Insufficient documentation

## 2019-12-22 DIAGNOSIS — E782 Mixed hyperlipidemia: Secondary | ICD-10-CM | POA: Insufficient documentation

## 2019-12-22 DIAGNOSIS — E1169 Type 2 diabetes mellitus with other specified complication: Secondary | ICD-10-CM | POA: Insufficient documentation

## 2019-12-22 LAB — COMPREHENSIVE METABOLIC PANEL
ALT: 18 IU/L (ref 0–44)
AST: 11 IU/L (ref 0–40)
Albumin/Globulin Ratio: 1.7 (ref 1.2–2.2)
Albumin: 4.5 g/dL (ref 4.0–5.0)
Alkaline Phosphatase: 71 IU/L (ref 44–121)
BUN/Creatinine Ratio: 18 (ref 9–20)
BUN: 13 mg/dL (ref 6–24)
Bilirubin Total: 0.5 mg/dL (ref 0.0–1.2)
CO2: 26 mmol/L (ref 20–29)
Calcium: 9.7 mg/dL (ref 8.7–10.2)
Chloride: 103 mmol/L (ref 96–106)
Creatinine, Ser: 0.74 mg/dL — ABNORMAL LOW (ref 0.76–1.27)
GFR calc Af Amer: 125 mL/min/{1.73_m2} (ref >59)
GFR calc non Af Amer: 108 mL/min/{1.73_m2} (ref >59)
Globulin, Total: 2.6 g/dL (ref 1.5–4.5)
Glucose: 92 mg/dL (ref 65–99)
Potassium: 4.8 mmol/L (ref 3.5–5.2)
Sodium: 142 mmol/L (ref 134–144)
Total Protein: 7.1 g/dL (ref 6.0–8.5)

## 2019-12-22 LAB — LIPID PANEL
Chol/HDL Ratio: 5.4 ratio — ABNORMAL HIGH (ref 0.0–5.0)
Cholesterol, Total: 188 mg/dL (ref 100–199)
HDL: 35 mg/dL — ABNORMAL LOW (ref >39)
LDL Chol Calc (NIH): 136 mg/dL — ABNORMAL HIGH (ref 0–99)
Triglycerides: 92 mg/dL (ref 0–149)
VLDL Cholesterol Cal: 17 mg/dL (ref 5–40)

## 2019-12-22 LAB — VITAMIN D 25 HYDROXY (VIT D DEFICIENCY, FRACTURES): Vit D, 25-Hydroxy: 56.8 ng/mL (ref 30.0–100.0)

## 2019-12-22 NOTE — Progress Notes (Signed)
Chief Complaint:   OBESITY ESSIE GEHRET is here to discuss his progress with his obesity treatment plan along with follow-up of his obesity related diagnoses. Corie is on the Category 3 Plan and states he is following his eating plan approximately 60% of the time. Brecken states he is doing PT 15 minutes 7 times per week.  Today's visit was #: 8 Starting weight: 293 lbs Starting date: 09/07/2019 Today's weight: 278 lbs Today's date: 12/21/2019 Total lbs lost to date: 15 Total lbs lost since last in-office visit: 0  Interim History:Farrow wrap has greatly reduced pain/edema of his RLE. PT has released him from therapy for this issue. He will continue with PT to address cervical neck pain now. Wallace celebrated his 11th wedding anniversary at [employer] - he ordered the [employer] and healthiest vegetable options. He and his wife split bread and side dishes. He enjoyed a 100 calorie drink and 1/2 of the homemade truffle.  Subjective:   Vitamin D deficiency. Last Vitamin D level on 09/07/2019 was 9.1, which is well below goal of 50. Nelton is on Ergocalciferol. No nausea, vomiting, or muscle weakness.    Ref. Range 09/07/2019 14:32  Vitamin D, 25-Hydroxy Latest Ref Range: 30.0 - 100.0 ng/mL 9.1 (L)   Low HDL (under 40). Lipid panel on 09/07/2019 revealed an HDL level of 34. Randolf's physical activity consists of PT. He is on atorvastatin 40 mg daily.  Hyperlipidemia associated with type 2 diabetes mellitus ([place]). Lipid panel on 09/07/2019 revealed a total cholesterol of 106, HDL 34, triglycerides 127, and LDL of 49. Mahmood is on atorvastatin 40 mg daily.  Lab Results  Component Value Date   CHOL 106 09/07/2019   HDL 34 (L) 09/07/2019   LDLCALC 49 09/07/2019   LDLDIRECT 125.0 12/26/2018   TRIG 127 09/07/2019   CHOLHDL 6 12/26/2018   Lab Results  Component Value Date   ALT 24 09/07/2019   AST 14 09/07/2019   ALKPHOS 76 09/07/2019   BILITOT 0.7 09/07/2019   The  ASCVD Risk score [name], et al., 2013) failed to calculate for the following reasons:   The valid total cholesterol range is 130 to 320 mg/dL  Type 2 diabetes mellitus with other specified complication, with long-term current use of insulin ([place]). Jacquise is on metformin, Semaglutide, Invokana, and Humalog. 11/30/2019 A1c 5.9, at goal! He is followed by Dr. Cruzita Lederer every 3 months. He reports fasting blood glucose levels in the 90's.   Lab Results  Component Value Date   HGBA1C 5.9 (A) 11/30/2019   HGBA1C 6.4 (A) 07/27/2019   HGBA1C 7.5 (A) 04/27/2019   Lab Results  Component Value Date   MICROALBUR 0.8 12/26/2018   LDLCALC 49 09/07/2019   CREATININE 0.70 (L) 09/07/2019   No results found for: INSULIN  At risk for osteoporosis. Kalem is at higher risk of osteopenia and osteoporosis due to Vitamin D deficiency and obesity.   Assessment/Plan:   Vitamin D deficiency. Low Vitamin D level contributes to fatigue and are associated with obesity, breast, and colon cancer. VITAMIN D 25 Hydroxy (Vit-D Deficiency, Fractures) level will be checked today.   Low HDL (under 40). Labs will be checked today.  Hyperlipidemia associated with type 2 diabetes mellitus ([place]).  Cardiovascular risk and specific lipid/LDL goals reviewed.  We discussed several lifestyle modifications today and Cullen will continue to work on diet, exercise and weight loss efforts. Orders and follow up as documented in patient record. Labs will be  checked today.  Counseling Intensive lifestyle modifications are the first line treatment for this issue. . Dietary changes: Increase soluble fiber. Decrease simple carbohydrates. . Exercise changes: Moderate to vigorous-intensity aerobic activity 150 minutes per week if tolerated. . Lipid-lowering medications: see documented in medical record.  Type 2 diabetes mellitus with other specified complication, with long-term current use of insulin ([place]). Good blood sugar control  is important to decrease the likelihood of diabetic complications such as nephropathy, neuropathy, limb loss, blindness, coronary artery disease, and death. Intensive lifestyle modification including diet, exercise and weight loss are the first line of treatment for diabetes. Erwin will continue his current anti-diabetic regimen per Dr. Cruzita Lederer of Endocrinology. Labs will be checked today.  At risk for osteoporosis. Haskel was given approximately 15 minutes of osteoporosis prevention counseling today. Artem is at risk for osteopenia and osteoporosis due to his Vitamin D deficiency. He was encouraged to take his Vitamin D and follow his higher calcium diet and increase strengthening exercise to help strengthen his bones and decrease his risk of osteopenia and osteoporosis.  Repetitive spaced learning was employed today to elicit superior memory formation and behavioral change.  Class 2 severe obesity with serious comorbidity and body mass index (BMI) of 38.0 to 38.9 in adult, unspecified obesity type ([place]).  Ashden is currently in the action stage of change. As such, his goal is to continue with weight loss efforts. He has agreed to the Category 3 Plan.   Exercise goals: Trayven will continue cervical neck PT twice weekly.  Behavioral modification strategies: increasing lean protein intake, meal planning and cooking strategies, celebration eating strategies and planning for success.  [name] has agreed to follow-up with our clinic in 2 weeks. He was informed of the importance of frequent follow-up visits to maximize his success with intensive lifestyle modifications for his multiple health conditions.   Sotirios was informed we would discuss his lab results at his next visit unless there is a critical issue that needs to be addressed sooner. Vivek agreed to keep his next visit at the agreed upon time to discuss these results.  Objective:   Blood pressure 119/71, pulse 73, temperature 97.8  F (36.6 C), height 5\' 11"  (1.803 m), weight 278 lb (126.1 kg), SpO2 97 %. Body mass index is 38.77 kg/m.  General: Cooperative, alert, well developed, in no acute distress. HEENT: Conjunctivae and lids unremarkable. Cardiovascular: Regular rhythm.  Lungs: Normal work of breathing. Neurologic: No focal deficits.   Lab Results  Component Value Date   CREATININE 0.70 (L) 09/07/2019   BUN 18 09/07/2019   NA 141 09/07/2019   K 4.6 09/07/2019   CL 102 09/07/2019   CO2 25 09/07/2019   Lab Results  Component Value Date   ALT 24 09/07/2019   AST 14 09/07/2019   ALKPHOS 76 09/07/2019   BILITOT 0.7 09/07/2019   Lab Results  Component Value Date   HGBA1C 5.9 (A) 11/30/2019   HGBA1C 6.4 (A) 07/27/2019   HGBA1C 7.5 (A) 04/27/2019   HGBA1C 6.7 (A) 12/26/2018   HGBA1C 7.3 (A) 09/16/2017   No results found for: INSULIN Lab Results  Component Value Date   TSH 1.32 04/27/2019   Lab Results  Component Value Date   CHOL 106 09/07/2019   HDL 34 (L) 09/07/2019   LDLCALC 49 09/07/2019   LDLDIRECT 125.0 12/26/2018   TRIG 127 09/07/2019   CHOLHDL 6 12/26/2018   Lab Results  Component Value Date   WBC 8.7 02/02/2019   HGB  16.0 02/02/2019   HCT 46.8 02/02/2019   MCV 94.8 02/02/2019   PLT 233.0 02/02/2019   Lab Results  Component Value Date   FERRITIN 141.3 07/22/2017   Attestation Statements:   Reviewed by clinician on day of visit: allergies, medications, problem list, medical history, surgical history, family history, social history, and previous encounter notes.  I, Michaelene Song, am acting as Location manager for [employer], NP-C   I have reviewed the above documentation for accuracy and completeness, and I agree with the above. -  [name] Osuna d. Damar Petit, NP-C

## 2019-12-26 ENCOUNTER — Encounter: Payer: Self-pay | Admitting: Internal Medicine

## 2019-12-27 MED ORDER — FREESTYLE LIBRE 14 DAY SENSOR MISC: 11 refills | Status: DC

## 2019-12-28 ENCOUNTER — Other Ambulatory Visit: Payer: Self-pay

## 2019-12-28 ENCOUNTER — Ambulatory Visit (INDEPENDENT_AMBULATORY_CARE_PROVIDER_SITE_OTHER): Payer: BC Managed Care – PPO | Admitting: Rehabilitative and Restorative Service Providers"

## 2019-12-28 ENCOUNTER — Encounter: Payer: Self-pay | Admitting: Rehabilitative and Restorative Service Providers"

## 2019-12-28 DIAGNOSIS — M436 Torticollis: Secondary | ICD-10-CM

## 2019-12-28 DIAGNOSIS — M542 Cervicalgia: Secondary | ICD-10-CM

## 2019-12-28 DIAGNOSIS — R29898 Other symptoms and signs involving the musculoskeletal system: Secondary | ICD-10-CM

## 2019-12-28 DIAGNOSIS — R293 Abnormal posture: Secondary | ICD-10-CM

## 2019-12-28 NOTE — Therapy (Signed)
[place], [place], [address] Phone: [phone]   Fax:  [phone]  Physical Therapy Evaluation  Patient Details  Name: Troy [name] MRN: [id number] Date of Birth: [birth year] Referring Provider (PT): Dr [name]   Encounter Date: 12/28/2019   PT End of Session - 12/28/19 1102    Visit Number 1    Number of Visits 12    Date for PT Re-Evaluation 02/08/20    PT Start Time 8299    PT Stop Time 1103    PT Time Calculation (min) 48 min    Activity Tolerance Patient tolerated treatment well           Past Medical History:  Diagnosis Date   Arm weakness    right    Asthma    B12 deficiency    Back pain    Diabetes insipidus ([place])    Diabetes mellitus without complication ([place])    DVT (deep venous thrombosis) ([place])    Leg edema, right    Male infertility    Obesity    Obesity    OSA (obstructive sleep apnea)     Past Surgical History:  Procedure Laterality Date   [place] SURGERY     C6/C7 2009   cubital tunnel left arm     2003   ELBOW SURGERY     FIBULA FRACTURE SURGERY     plate & pin removed due to infection [place]      There were no vitals filed for this visit.    Subjective Assessment - 12/28/19 1022    Subjective Patient reports that he started having pain in the Lt side of his neck about 6-8 weeks ago with no know injury. The neck has gotten tighter.    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 Yes    Pain Score 1    8/10 wih movement   Pain Location Neck    Pain Orientation Left;Mid;Lower    Pain Descriptors / Indicators Sharp;Stabbing    Pain Type Acute pain    Pain Radiating Towards up  toward head    Pain Onset More than a month ago    Pain Frequency Constant    Aggravating Factors  movement of head and neck    Pain Relieving Factors holding head and neck still              [facility] PT Assessment - 12/28/19 0001      Assessment   Medical Diagnosis Cervical dysfunction    Referring Provider (PT) Dr [name]    Onset Date/Surgical Date 10/25/19    Hand Dominance Right    Next MD Visit after course of PT     Prior Therapy for lymphodema       Precautions   Precautions None      Balance Screen   Has the patient fallen in the past 6 months No    Has the patient had a decrease in activity level because of a fear of falling?  No    Is the patient reluctant to leave their home because of a fear of falling?  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     Leisure home repair; yard work; 15 min walking daily; PT exercises for LE       Observation/Other Assessments   Focus on Therapeutic Outcomes (FOTO)  51% limitation       Sensation   Additional Comments tingling/some numbness Rt UE       Posture/Postural Control   Posture Comments head forward; shoulders rounded and elevated; increased thoracic kyphosis      AROM   Cervical Flexion 64    Cervical Extension 28 pain     Cervical - Right Side Bend 17pain Lt cervical     Cervical - Left Side Bend 11 pain Lt cervical     Cervical - Right Rotation 52 mild pulling pain Lt cervical     Cervical - Left Rotation 40 pain Lt cervical       Strength   Overall Strength Comments WFL's UE's       Palpation   Spinal mobility hypomobile upper thoracic and cervical spine with PA mobs     Palpation comment muscular tightness ant/lat/post cervical musculature; pecs; upper trap; leveator                       Objective measurements completed on examination: See above findings.       [place] [facility] Adult PT Treatment/Exercise  - 12/28/19 0001      Neuro Re-ed    Neuro Re-ed Details  working on posture and alignment engaging posterior shoulder girdle musculature       Shoulder Exercises: Standing   Other Standing Exercises axial extension 10 sec x 10; scap squeeze 10 sec x 10; L's x 10; W's x 10 with swim noodle       Shoulder Exercises: Stretch   Other Shoulder Stretches doorway stretch 30 se x 2 reps 3 positions                  PT Education - 12/28/19 1050    Education Details HEP POC DN TENS    Person(s) Educated Patient    Methods Explanation;Demonstration;Tactile cues;Verbal cues;Handout    Comprehension Verbalized understanding;Returned demonstration;Verbal cues required;Tactile cues required               PT Long Term Goals - 12/28/19 1252      PT LONG TERM GOAL #1   Title Patient demonstrates improve posture and alignment with activation of posterior shoulder girdle and improved position of head and neck for functional activities    Time 6    Period Weeks    Status New    Target Date 02/08/20      PT LONG TERM GOAL #2   Title Increase cervical ROM to [facility] and pain free in extension, lateral flexion, rotation    Time 6    Period Weeks    Status New    Target Date 02/08/20      PT LONG TERM GOAL #3   Title Patient reports ability to turn head and tilt head to side functionally with no pain    Time 6    Period Weeks    Status New    Target Date 02/08/20      PT LONG TERM GOAL #4   Title Independent in HEP    Time 6    Period Weeks    Status New    Target Date  02/08/20      PT LONG TERM GOAL #5   Title Improve FOTO to </= 28% limitation    Time 6    Period Weeks    Status New    Target Date 02/08/20                  Plan - 12/28/19 1239    Clinical Impression Statement Patient presents with c/o Lt cervical pain and tightness which has been present for the past 6-8 weeks. There is no known injury. Patient has a history of cervical disc fusion ~ 12 yrs ago  and cervical whiplash ~ 5 yrs ago. H has poor posture and alignment; decreased postural strength/posterior shoulder girdle musculature; limited and painful cervical ROM/mobility; muscular tightness to palpation through the cervical and shoulder girdle musculature Lt > Rt; limited functional abiilties; pain on a daily basis. Patient will benefit form PT to address problems identified.    Stability/Clinical Decision Making Stable/Uncomplicated    Clinical Decision Making Low    Rehab Potential Good    PT Frequency 2x / week    PT Duration 6 weeks    PT Treatment/Interventions ADLs/Self Care Home Management;Aquatic Therapy;Cryotherapy;Electrical Stimulation;Iontophoresis 4mg /ml Dexamethasone;Moist Heat;Ultrasound;Therapeutic activities;Therapeutic exercise;Neuromuscular re-education;Patient/family education;Manual techniques;Dry needling;Taping    PT Next Visit Plan review HEP; add trial of DN/manual work through the Lt cervical musculature; cervical stretching; posterior shoulder girdle strengthening; modalities as indicated(pt to bring TENS unit from home for PT to check)    PT Home Exercise Plan [email or web address]    Consulted and Agree with Plan of Care Patient           Patient will benefit from skilled therapeutic intervention in order to improve the following deficits and impairments:  Increased fascial restricitons, Increased muscle spasms, Pain, Hypomobility, Impaired flexibility, Improper body mechanics, Decreased mobility, Decreased strength, Postural dysfunction  Visit Diagnosis: Cervicalgia - Plan: PT plan of care cert/re-cert  Other symptoms and signs involving the musculoskeletal system - Plan: PT plan of care cert/re-cert  Abnormal posture - Plan: PT plan of care cert/re-cert  Stiffness of neck - Plan: PT plan of care cert/re-cert     Problem List Patient Active Problem List   Diagnosis Date Noted   Hyperlipidemia associated with type 2 diabetes mellitus ([place]) 12/22/2019    Vitamin D deficiency 09/08/2019   Low HDL (under 40) 09/08/2019   Pancreatic insufficiency 03/28/2018   Diabetes mellitus ([place]) 09/16/2017   DVT (deep venous thrombosis), right 04/28/2015   Acute deep vein thrombosis (DVT) of femoral vein of right lower extremity ([place]) 04/28/2015   Neck injury 12/22/2013   Hemorrhoid 03/23/2013   Varicose vein of leg right 03/23/2013   Visit for preventive health examination 03/23/2013   Diastasis recti 12/22/2012   Tendinitis of right shoulder 09/27/2011   Right shoulder pain 09/27/2011   Allergic rhinitis, cause unspecified 05/23/2011   Sleep disturbance, unspecified 02/21/2011   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   Preventative health care 12/05/2010   Chronic diarrhea of unknown origin pt says not from metformin 12/05/2010   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   URINARY URGENCY 01/21/2009   Hyperlipidemia 01/23/2008   Obstructive sleep apnea 12/22/2007   VARICOSE VEINS, LOWER EXTREMITIES 10/03/2007   ASTHMA 09/19/2007   ASTHMA UNSPECIFIED WITH EXACERBATION 09/19/2007   PLANTAR FASCIITIS, LEFT 06/23/2007   RASH AND OTHER NONSPECIFIC SKIN ERUPTION 05/26/2007   EDEMA 05/26/2007   Class 2 severe obesity with serious comorbidity and body mass index (BMI) of 38.0 to 38.9 in adult (  [place]) 02/13/2007   DVT, HX OF 01/15/2007   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    Tegan Britain Nilda Simmer PT, MPH  12/28/2019, 12:57 PM  [facility] [place], [place], [address] Phone: [phone]   Fax:  [phone]  Name: JADARIAN MCKAY MRN: [id number] Date of Birth: November 15, 1970

## 2019-12-28 NOTE — Patient Instructions (Signed)
Access Code: [email or web address]: [email or web address]: 10/18/2021Prepared by: Chesney Klimaszewski HoltExercises  Seated Cervical Retraction - 2 x daily - 7 x weekly - 1-2 sets - 5-10 reps - 10 sec hold  Seated Scapular Retraction - 2 x daily - 7 x weekly - 1-2 sets - 10 reps - 10 sec hold  Shoulder External Rotation - 2 x daily - 7 x weekly - 1-2 sets - 10 reps - 3 sec hold  Shoulder External Rotation in 45 Degrees Abduction - 2 x daily - 7 x weekly - 1-2 sets - 10 reps - 3 sec hold  Doorway Pec Stretch at 60 Degrees Abduction - 3 x daily - 7 x weekly - 3 reps - 1 sets  Doorway Pec Stretch at 90 Degrees Abduction - 3 x daily - 7 x weekly - 3 reps - 1 sets - 30 seconds hold  Doorway Pec Stretch at 120 Degrees Abduction - 3 x daily - 7 x weekly - 3 reps - 1 sets - 30 second hold hold Patient Education  Trigger Point Dry Needling TENS UNIT: This is helpful for muscle pain and spasm.   Search and Purchase a TENS 7000 2nd edition at www.tenspros.com. It should be less than $30.     TENS unit instructions: Do not shower or bathe with the unit on Turn the unit off before removing electrodes or batteries If the electrodes lose stickiness add a drop of water to the electrodes after they are disconnected from the unit and place on plastic sheet. If you continued to have difficulty, call the TENS unit company to purchase more electrodes. Do not apply lotion on the skin area prior to use. Make sure the skin is clean and dry as this will help prolong the life of the electrodes. After use, always check skin for unusual red areas, rash or other skin difficulties. If there are any skin problems, does not apply electrodes to the same area. Never remove the electrodes from the unit by pulling the wires. Do not use the TENS unit or electrodes other than as directed. Do not change electrode placement without consultating your therapist or physician. Keep 2 fingers with between each electrode. Wear time ratio is  2:1, on to off times.    For example on for 30 minutes off for 15 minutes and then on for 30 minutes off for 15 minutes

## 2020-01-04 ENCOUNTER — Other Ambulatory Visit: Payer: Self-pay

## 2020-01-04 ENCOUNTER — Ambulatory Visit (INDEPENDENT_AMBULATORY_CARE_PROVIDER_SITE_OTHER): Payer: BC Managed Care – PPO | Admitting: Rehabilitative and Restorative Service Providers"

## 2020-01-04 ENCOUNTER — Ambulatory Visit (INDEPENDENT_AMBULATORY_CARE_PROVIDER_SITE_OTHER): Payer: BC Managed Care – PPO | Admitting: Adult Health

## 2020-01-04 ENCOUNTER — Encounter: Payer: Self-pay | Admitting: Rehabilitative and Restorative Service Providers"

## 2020-01-04 ENCOUNTER — Encounter (INDEPENDENT_AMBULATORY_CARE_PROVIDER_SITE_OTHER): Payer: Self-pay | Admitting: Adult Health

## 2020-01-04 VITALS — BP 103/67 | HR 89 | Temp 97.9°F | Ht 71.0 in | Wt 274.0 lb

## 2020-01-04 DIAGNOSIS — M542 Cervicalgia: Secondary | ICD-10-CM

## 2020-01-04 DIAGNOSIS — E118 Type 2 diabetes mellitus with unspecified complications: Secondary | ICD-10-CM | POA: Diagnosis not present

## 2020-01-04 DIAGNOSIS — Z6838 Body mass index (BMI) 38.0-38.9, adult: Secondary | ICD-10-CM

## 2020-01-04 DIAGNOSIS — R29898 Other symptoms and signs involving the musculoskeletal system: Secondary | ICD-10-CM

## 2020-01-04 DIAGNOSIS — R293 Abnormal posture: Secondary | ICD-10-CM

## 2020-01-04 DIAGNOSIS — E7849 Other hyperlipidemia: Secondary | ICD-10-CM | POA: Diagnosis not present

## 2020-01-04 DIAGNOSIS — E559 Vitamin D deficiency, unspecified: Secondary | ICD-10-CM | POA: Diagnosis not present

## 2020-01-04 DIAGNOSIS — M436 Torticollis: Secondary | ICD-10-CM

## 2020-01-04 DIAGNOSIS — Z9189 Other specified personal risk factors, not elsewhere classified: Secondary | ICD-10-CM

## 2020-01-04 DIAGNOSIS — Z794 Long term (current) use of insulin: Secondary | ICD-10-CM

## 2020-01-04 MED ORDER — ATORVASTATIN CALCIUM 40 MG PO TABS: ORAL_TABLET | ORAL | 0 refills | Status: DC

## 2020-01-04 NOTE — Patient Instructions (Addendum)
Trigger Point Dry Needling  . What is Trigger Point Dry Needling (DN)? o DN is a physical therapy technique used to treat muscle pain and dysfunction. Specifically, DN helps deactivate muscle trigger points (muscle knots).  o A thin filiform needle is used to penetrate the skin and stimulate the underlying trigger point. The goal is for a local twitch response (LTR) to occur and for the trigger point to relax. No medication of any kind is injected during the procedure.   . What Does Trigger Point Dry Needling Feel Like?  o The procedure feels different for each individual patient. Some patients report that they do not actually feel the needle enter the skin and overall the process is not painful. Very mild bleeding may occur. However, many patients feel a deep cramping in the muscle in which the needle was inserted. This is the local twitch response.   [name] How Will I feel after the treatment? o Soreness is normal, and the onset of soreness may not occur for a few hours. Typically this soreness does not last longer than two days.  o Bruising is uncommon, however; ice can be used to decrease any possible bruising.  o In rare cases feeling tired or nauseous after the treatment is normal. In addition, your symptoms may get worse before they get better, this period will typically not last longer than 24 hours.   . What Can I do After My Treatment? o Increase your hydration by drinking more water for the next 24 hours. o You may place ice or heat on the areas treated that have become sore, however, do not use heat on inflamed or bruised areas. Heat often brings more relief post needling. o You can continue your regular activities, but vigorous activity is not recommended initially after the treatment for 24 hours. o DN is best combined with other physical therapy such as strengthening, stretching, and other therapies.    Access Code: [email or web address]: [email or web address]:  10/25/2021Prepared by: Kirill Chatterjee HoltExercises  Seated Cervical Retraction - 2 x daily - 7 x weekly - 1-2 sets - 5-10 reps - 10 sec hold  Seated Scapular Retraction - 2 x daily - 7 x weekly - 1-2 sets - 10 reps - 10 sec hold  Shoulder External Rotation - 2 x daily - 7 x weekly - 1-2 sets - 10 reps - 3 sec hold  Shoulder External Rotation in 45 Degrees Abduction - 2 x daily - 7 x weekly - 1-2 sets - 10 reps - 3 sec hold  Doorway Pec Stretch at 60 Degrees Abduction - 3 x daily - 7 x weekly - 3 reps - 1 sets  Doorway Pec Stretch at 90 Degrees Abduction - 3 x daily - 7 x weekly - 3 reps - 1 sets - 30 seconds hold  Doorway Pec Stretch at 120 Degrees Abduction - 3 x daily - 7 x weekly - 3 reps - 1 sets - 30 second hold hold  Standing Shoulder External Rotation with Resistance - 2 x daily - 7 x weekly - 1-3 sets - 10 reps - 2-3 sec hold  Standing Bilateral Low Shoulder Row with Anchored Resistance - 2 x daily - 7 x weekly - 1-3 sets - 10 reps - 2-3 sec hold  Shoulder Extension with Resistance - 2 x daily - 7 x weekly - 1-3 sets - 10 reps - 2-3 sec hold

## 2020-01-04 NOTE — Therapy (Signed)
[place], [place], [address] Phone: [phone]   Fax:  [phone]  Physical Therapy Treatment  Patient Details  Name: Troy [name] MRN: [id number] Date of Birth: 1970-03-22 Referring Provider (PT): Dr [name]   Encounter Date: 01/04/2020   PT End of Session - 01/04/20 1023    Visit Number 2    Number of Visits 12    Date for PT Re-Evaluation 02/08/20    PT Start Time 1020    PT Stop Time 1108    PT Time Calculation (min) 48 min    Activity Tolerance Patient tolerated treatment well           Past Medical History:  Diagnosis Date   Arm weakness    right    Asthma    B12 deficiency    Back pain    Diabetes insipidus ([place])    Diabetes mellitus without complication ([place])    DVT (deep venous thrombosis) ([place])    Leg edema, right    Male infertility    Obesity    Obesity    OSA (obstructive sleep apnea)     Past Surgical History:  Procedure Laterality Date   [place] SURGERY     C6/C7 2009   cubital tunnel left arm     2003   ELBOW SURGERY     FIBULA FRACTURE SURGERY     plate & pin removed due to infection [place]      There were no vitals filed for this visit.   Subjective Assessment - 01/04/20 1024    Subjective Patient reports that he has more flexibility but the pain is worse. Pain is in the Lt posterior neck area. Lt arm is sore from the COVID boster.    Currently in Pain? Yes    Pain Score 5     Pain Location Neck    Pain Orientation Left;Mid;Lower    Pain Descriptors / Indicators Sharp;Stabbing    Pain Type Acute pain              [facility] PT Assessment - 01/04/20 0001      Assessment   Medical Diagnosis Cervical dysfunction    Referring Provider (PT) Dr [name]    Onset Date/Surgical Date 10/25/19    Hand Dominance Right    Next MD Visit after course of PT     Prior Therapy for  lymphodema       AROM   Cervical Flexion 45    Cervical Extension 46 mild pain     Cervical - Right Side Bend 35 pain in Lt cervical     Cervical - Left Side Bend 41 pain in Lt cervical     Cervical - Right Rotation 56 pulling Lt cervical     Cervical - Left Rotation 52 pain Lt cervical      Palpation   Spinal mobility hypomobile upper thoracic and cervical spine with PA mobs; cervical fusion C7/T1 very tight C6/7 Lt      Palpation comment muscular tightness Lt > Rt ant/lat/post cervical musculature; pecs; upper trap; leveator                          [facility] Adult PT Treatment/Exercise - 01/04/20 0001      Shoulder Exercises: Standing   Extension Strengthening;Both;10 reps;Theraband    Theraband Level (Shoulder Extension) Level  3 (Green)    Row Strengthening;Both;10 reps;Theraband    Theraband Level (Shoulder Row) Level 3 (Green)    Retraction Strengthening;Both;10 reps;Theraband    Theraband Level (Shoulder Retraction) Level 2 (Red)    Other Standing Exercises axial extension 10 sec x 10; scap squeeze 10 sec x 10; L's x 10; W's x 10 with swim noodle       Shoulder Exercises: ROM/Strengthening   UBE (Upper Arm Bike) L1 x 2 min 1 min fwd/1 min back       Shoulder Exercises: Stretch   Other Shoulder Stretches doorway stretch 30 se x 2 reps 3 positions            Trigger Point Dry Needling - 01/04/20 0001    Consent Given? Yes    Education Handout Provided Yes    Muscles Treated Head and Neck Suboccipitals;Oblique capitus;Scalenes;Cervical multifidi    Dry Needling Comments Lt     Oblique Capitus Response Palpable increased muscle length    Suboccipitals Response Palpable increased muscle length    Scalenes Response Palpable increased muscle length    Cervical multifidi Response Palpable increased muscle length                PT Education - 01/04/20 1057    Education Details DN HEP    Person(s) Educated Patient    Methods  Explanation;Demonstration;Tactile cues;Verbal cues;Handout    Comprehension Verbalized understanding;Returned demonstration;Verbal cues required;Tactile cues required               PT Long Term Goals - 12/28/19 1252      PT LONG TERM GOAL #1   Title Patient demonstrates improve posture and alignment with activation of posterior shoulder girdle and improved position of head and neck for functional activities    Time 6    Period Weeks    Status New    Target Date 02/08/20      PT LONG TERM GOAL #2   Title Increase cervical ROM to [facility] and pain free in extension, lateral flexion, rotation    Time 6    Period Weeks    Status New    Target Date 02/08/20      PT LONG TERM GOAL #3   Title Patient reports ability to turn head and tilt head to side functionally with no pain    Time 6    Period Weeks    Status New    Target Date 02/08/20      PT LONG TERM GOAL #4   Title Independent in HEP    Time 6    Period Weeks    Status New    Target Date 02/08/20      PT LONG TERM GOAL #5   Title Improve FOTO to </= 28% limitation    Time 6    Period Weeks    Status New    Target Date 02/08/20                 Plan - 01/04/20 1025    Clinical Impression Statement Increased pain through cervical area. Improved cervical mobility/ROM. Patient tolerated DN and manual work well. Noted decreased palpable tightness following DN/manual work. Added posterior shoulder girdle strengthening without difficulty.    Rehab Potential Good    PT Frequency 2x / week    PT Duration 6 weeks    PT Treatment/Interventions ADLs/Self Care Home Management;Aquatic Therapy;Cryotherapy;Electrical Stimulation;Iontophoresis 4mg /ml Dexamethasone;Moist Heat;Ultrasound;Therapeutic activities;Therapeutic exercise;Neuromuscular re-education;Patient/family education;Manual techniques;Dry needling;Taping    PT Next Visit  Plan assess response to trial of DN/manual work through the Lt cervical musculature; cervical  stretching; posterior shoulder girdle strengthening; modalities as indicated; using TENS unit for home    PT Home Exercise Plan [email or web address]    Consulted and Agree with Plan of Care Patient           Patient will benefit from skilled therapeutic intervention in order to improve the following deficits and impairments:     Visit Diagnosis: Cervicalgia  Other symptoms and signs involving the musculoskeletal system  Abnormal posture  Stiffness of neck     Problem List Patient Active Problem List   Diagnosis Date Noted   Hyperlipidemia associated with type 2 diabetes mellitus ([place]) 12/22/2019   Vitamin D deficiency 09/08/2019   Low HDL (under 40) 09/08/2019   Pancreatic insufficiency 03/28/2018   Diabetes mellitus ([place]) 09/16/2017   DVT (deep venous thrombosis), right 04/28/2015   Acute deep vein thrombosis (DVT) of femoral vein of right lower extremity ([place]) 04/28/2015   Neck injury 12/22/2013   Hemorrhoid 03/23/2013   Varicose vein of leg right 03/23/2013   Visit for preventive health examination 03/23/2013   Diastasis recti 12/22/2012   Tendinitis of right shoulder 09/27/2011   Right shoulder pain 09/27/2011   Allergic rhinitis, cause unspecified 05/23/2011   Sleep disturbance, unspecified 02/21/2011   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   Preventative health care 12/05/2010   Chronic diarrhea of unknown origin pt says not from metformin 12/05/2010   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   URINARY URGENCY 01/21/2009   Hyperlipidemia 01/23/2008   Obstructive sleep apnea 12/22/2007   VARICOSE VEINS, LOWER EXTREMITIES 10/03/2007   ASTHMA 09/19/2007   ASTHMA UNSPECIFIED WITH EXACERBATION 09/19/2007   PLANTAR FASCIITIS, LEFT 06/23/2007   RASH AND OTHER NONSPECIFIC SKIN ERUPTION 05/26/2007   EDEMA 05/26/2007   Class 2 severe obesity with serious comorbidity and body mass index (BMI) of 38.0 to 38.9 in adult ([place]) 02/13/2007   DVT, HX OF  01/15/2007   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    Alana Dayton Nilda Simmer PT, MPH  01/04/2020, 11:10 AM  [facility] [place] [address] [place], [place], [address] Phone: [phone]   Fax:  [phone]  Name: MICKIE KOZIKOWSKI MRN: [id number] Date of Birth: 07-07-1970

## 2020-01-05 NOTE — Progress Notes (Signed)
Chief Complaint:   OBESITY Troy [name] is here to discuss his progress with his obesity treatment plan along with follow-up of his obesity related diagnoses. Troy [name] is on the Category 3 Plan and states he is following his eating plan approximately 90% of the time. Troy [name] states he is walking 10-15 minutes 5 times per week.  Today's visit was #: 9 Starting weight: 293 lbs Starting date: 09/07/2019 Today's weight: 274 lbs Today's date: 01/04/2020 Total lbs lost to date: 19 Total lbs lost since last in-office visit: 4  Interim History: Kayshaun will often order 2 Egg McMuffins and remove 3 of the 4 pieces of bread and only consumes Canadian bacon and egg - he has them hold the cheese. He estimates to consume McDonald's for breakfast 4-5 times per week. We calculated out the McDonald's breakfast variation he is eating as is approximately 320 calories.  Subjective:   Vitamin D deficiency. Vitamin D level on 12/21/2019 was 56.8, at goal. Troy [name] is on Ergocalciferol. No nausea, vomiting, or muscle weakness. Labs were discussed with the patient today.    Ref. Range 12/21/2019 14:33  Vitamin D, 25-Hydroxy Latest Ref Range: 30.0 - 100.0 ng/mL 56.8   Other hyperlipidemia. 12/21/2019 lipid panel showed total cholesterol 188, triglycerides 92, HDL 35, and LDL 136, well above goal of 70. Troy [name] had been off atorvastatin 40 mg for greater than 4 weeks at time of lipid panel. Labs were discussed with the patient today.   Lab Results  Component Value Date   CHOL 188 12/21/2019   HDL 35 (L) 12/21/2019   LDLCALC 136 (H) 12/21/2019   LDLDIRECT 125.0 12/26/2018   TRIG 92 12/21/2019   CHOLHDL 5.4 (H) 12/21/2019   Lab Results  Component Value Date   ALT 18 12/21/2019   AST 11 12/21/2019   ALKPHOS 71 12/21/2019   BILITOT 0.5 12/21/2019   The 10-year ASCVD risk score Troy Bussing DC Jr., et al., 2013) is: 5.5%   Values used to calculate the score:     Age: 21 years     Sex: Male     Is  Non-Hispanic African American: No     Diabetic: Yes     Tobacco smoker: No     Systolic Blood Pressure: 591 mmHg     Is BP treated: No     HDL Cholesterol: 35 mg/dL     Total Cholesterol: 188 mg/dL  Type 2 diabetes mellitus with complication, with long-term current use of insulin (Troy [name]). Troy [name] is managed by Dr. Cruzita Lederer of Endocrinology. 11/30/2019 A1C was 5.9. Garvis is on metformin, Semaglutide, Invokana, and Humalog. Insulin was recently reduced by Endocrinology. Labs were discussed with the patient today.   Lab Results  Component Value Date   HGBA1C 5.9 (A) 11/30/2019   HGBA1C 6.4 (A) 07/27/2019   HGBA1C 7.5 (A) 04/27/2019   Lab Results  Component Value Date   MICROALBUR 0.8 12/26/2018   LDLCALC 136 (H) 12/21/2019   CREATININE 0.74 (L) 12/21/2019   No results found for: INSULIN  At risk for heart disease. Troy [name] is at a higher than average risk for cardiovascular disease due to hyperlipidemia (off statin for greater than 4 weeks), type II diabetes, and obesity.   Assessment/Plan:   Vitamin D deficiency. Low Vitamin D level contributes to fatigue and are associated with obesity, breast, and colon cancer. He will complete the Ergocalciferol course and then convert to OTC Vitamin D3. He will follow-up for routine testing of Vitamin D, at least 2-3  times per year to avoid over-replacement.  Other hyperlipidemia. Cardiovascular risk and specific lipid/LDL goals reviewed.  We discussed several lifestyle modifications today and Yossef will continue to work on diet, exercise and weight loss efforts. Orders and follow up as documented in patient record. Refill was given for atorvastatin (LIPITOR) 40 MG tablet daily #90 with 0 refills.  Counseling Intensive lifestyle modifications are the first line treatment for this issue. . Dietary changes: Increase soluble fiber. Decrease simple carbohydrates. . Exercise changes: Moderate to vigorous-intensity aerobic activity 150 minutes per week  if tolerated. . Lipid-lowering medications: see documented in medical record.   Type 2 diabetes mellitus with complication, with long-term current use of insulin (Troy [name]). Good blood sugar control is important to decrease the likelihood of diabetic complications such as nephropathy, neuropathy, limb loss, blindness, coronary artery disease, and death. Intensive lifestyle modification including diet, exercise and weight loss are the first line of treatment for diabetes. Troy [name] will continue antidiabetic regimen per Endocrinology. He will continue regular walking for exercise.  At risk for heart disease. Troy [name] was given approximately 15 minutes of coronary artery disease prevention counseling today. He is 49 y.o. male and has risk factors for heart disease including obesity. We discussed intensive lifestyle modifications today with an emphasis on specific weight loss instructions and strategies.   Repetitive spaced learning was employed today to elicit superior memory formation and behavioral change.  Class 2 severe obesity with serious comorbidity and body mass index (BMI) of 38.0 to 38.9 in adult, unspecified obesity type (Troy [name]).  Troy [name] is currently in the action stage of change. As such, his goal is to continue with weight loss efforts. He has agreed to the Category 3 Plan.   Handout was provided on Protein Equivalents.  Exercise goals: Troy [name] will continue walking 10-15 minutes 5 times per week.  Behavioral modification strategies: increasing lean protein intake, meal planning and cooking strategies and planning for success.  Troy [name] has agreed to follow-up with our clinic in 3 weeks. He was informed of the importance of frequent follow-up visits to maximize his success with intensive lifestyle modifications for his multiple health conditions.   Objective:   Blood pressure 103/67, pulse 89, temperature 97.9 F (36.6 C), height 5\' 11"  (1.803 m), weight 274 lb (124.3 kg), SpO2 100 %. Body  mass index is 38.22 kg/m.  General: Cooperative, alert, well developed, in no acute distress. HEENT: Conjunctivae and lids unremarkable. Cardiovascular: Regular rhythm.  Lungs: Normal work of breathing. Neurologic: No focal deficits.   Lab Results  Component Value Date   CREATININE 0.74 (L) 12/21/2019   BUN 13 12/21/2019   NA 142 12/21/2019   K 4.8 12/21/2019   CL 103 12/21/2019   CO2 26 12/21/2019   Lab Results  Component Value Date   ALT 18 12/21/2019   AST 11 12/21/2019   ALKPHOS 71 12/21/2019   BILITOT 0.5 12/21/2019   Lab Results  Component Value Date   HGBA1C 5.9 (A) 11/30/2019   HGBA1C 6.4 (A) 07/27/2019   HGBA1C 7.5 (A) 04/27/2019   HGBA1C 6.7 (A) 12/26/2018   HGBA1C 7.3 (A) 09/16/2017   No results found for: INSULIN Lab Results  Component Value Date   TSH 1.32 04/27/2019   Lab Results  Component Value Date   CHOL 188 12/21/2019   HDL 35 (L) 12/21/2019   LDLCALC 136 (H) 12/21/2019   LDLDIRECT 125.0 12/26/2018   TRIG 92 12/21/2019   CHOLHDL 5.4 (H) 12/21/2019   Lab Results  Component Value Date  WBC 8.7 02/02/2019   HGB 16.0 02/02/2019   HCT 46.8 02/02/2019   MCV 94.8 02/02/2019   PLT 233.0 02/02/2019   Lab Results  Component Value Date   FERRITIN 141.3 07/22/2017   Attestation Statements:   Reviewed by clinician on day of visit: allergies, medications, problem list, medical history, surgical history, family history, social history, and previous encounter notes.  I, Michaelene Song, am acting as Location manager for [employer], NP-C   I have reviewed the above documentation for accuracy and completeness, and I agree with the above. -  Alonah Lineback d. Zaleigh Bermingham, NP-C

## 2020-01-06 ENCOUNTER — Encounter: Payer: Self-pay | Admitting: Physical Therapy

## 2020-01-06 ENCOUNTER — Other Ambulatory Visit: Payer: Self-pay

## 2020-01-06 ENCOUNTER — Ambulatory Visit (INDEPENDENT_AMBULATORY_CARE_PROVIDER_SITE_OTHER): Payer: BC Managed Care – PPO | Admitting: Physical Therapy

## 2020-01-06 DIAGNOSIS — R293 Abnormal posture: Secondary | ICD-10-CM | POA: Diagnosis not present

## 2020-01-06 DIAGNOSIS — R29898 Other symptoms and signs involving the musculoskeletal system: Secondary | ICD-10-CM | POA: Diagnosis not present

## 2020-01-06 DIAGNOSIS — M436 Torticollis: Secondary | ICD-10-CM | POA: Diagnosis not present

## 2020-01-06 DIAGNOSIS — M542 Cervicalgia: Secondary | ICD-10-CM

## 2020-01-06 NOTE — Therapy (Signed)
[place], [place], [address] Phone: [phone]   Fax:  [phone]  Physical Therapy Treatment  Patient Details  Name: Troy [name] MRN: [id number] Date of Birth: March 11, 1971 Referring Provider (PT): Dr [name]   Encounter Date: 01/06/2020   PT End of Session - 01/06/20 0805    Visit Number 3    Number of Visits 12    Date for PT Re-Evaluation 02/08/20    PT Start Time 0716    PT Stop Time 0759    PT Time Calculation (min) 43 min    Activity Tolerance Patient tolerated treatment well    Behavior During Therapy [facility] for tasks assessed/performed           Past Medical History:  Diagnosis Date  . Arm weakness    right   . Asthma   . B12 deficiency   . Back pain   . Diabetes insipidus ([place])   . Diabetes mellitus without complication ([place])   . DVT (deep venous thrombosis) ([place])   . Leg edema, right   . Male infertility   . Obesity   . Obesity   . OSA (obstructive sleep apnea)     Past Surgical History:  Procedure Laterality Date  . BACK SURGERY    . CERVICAL DISC SURGERY     C6/C7 2009  . cubital tunnel left arm     2003  . ELBOW SURGERY    . FIBULA FRACTURE SURGERY     plate & pin removed due to infection 1997  . ULNAR NERVE REPAIR      There were no vitals filed for this visit.   Subjective Assessment - 01/06/20 0716    Subjective Pt reports his neck only hurts with certain motions.  It's more a throbbing pain than sharp. He does his exercises 1x/day, but also performs scap squeezes throughout the day.    Currently in Pain? Yes    Pain Score 1     Pain Location Neck    Pain Orientation Left    Pain Descriptors / Indicators Dull    Aggravating Factors  lateral flexion, rotation    Pain Relieving Factors DN, hot shower              [facility] PT Assessment - 01/06/20 0001      Assessment   Medical Diagnosis Cervical dysfunction    Referring Provider (PT) Dr  [name]    Onset Date/Surgical Date 10/25/19    Hand Dominance Right    Next MD Visit after course of PT     Prior Therapy for lymphodema            [facility] Adult PT Treatment/Exercise - 01/06/20 0001      Self-Care   Self-Care Other Self-Care Comments;Posture    Posture Discussed work station possible modifications.     Other Self-Care Comments  Pt educated on self massage with ball to periscapular musculature; pt returned demo with cues (using theracane and ball)      Shoulder Exercises: Seated   Other Seated Exercises W's x 5 sec x 5 reps; L's x  5 sec x 5 reps.  Cervical rotation with head nods (3) x 5 reps each side, within tolerance.  Seated lateral neck flexion x 3 reps x 5-10 sec, range to tolerance.     Other Seated Exercises thoracic ext over back of chair with hands supporting head x 2 reps of 5 sec  Shoulder Exercises: Standing   Extension Strengthening;Both;10 reps;Theraband    Theraband Level (Shoulder Extension) Level 3 (Green)   3 sec pause   Row Strengthening;Both;10 reps;Theraband    Theraband Level (Shoulder Row) Level 3 (Green)   3 sec pause   Retraction Strengthening;Both;Theraband;5 reps    Theraband Level (Shoulder Retraction) Level 2 (Red);Level 3 (Green)   3 sec pause     Shoulder Exercises: ROM/Strengthening   UBE (Upper Arm Bike) L1 x 2 min: 1 min fwd/1 min back       Shoulder Exercises: Stretch   Other Shoulder Stretches doorway stretch 30 se x 2 reps 3 positions- with weight shifts side to side in between positions.     Other Shoulder Stretches overhead doorway stretch x 5-10 sec x 2 reps      Manual Therapy   Manual Therapy Soft tissue mobilization    Soft tissue mobilization IASTM and STM to Lt upper trap, scalenes, cervical paraspinals, rhomboid, and levator -both static and dynamic movement of neck.             PT Long Term Goals - 01/06/20 0102      PT LONG TERM GOAL #1   Title Patient demonstrates improve posture and alignment with  activation of posterior shoulder girdle and improved position of head and neck for functional activities    Time 6    Period Weeks    Status On-going      PT LONG TERM GOAL #2   Title Increase cervical ROM to [facility] and pain free in extension, lateral flexion, rotation    Time 6    Period Weeks    Status On-going      PT LONG TERM GOAL #3   Title Patient reports ability to turn head and tilt head to side functionally with no pain    Time 6    Period Weeks    Status On-going      PT LONG TERM GOAL #4   Title Independent in HEP    Time 6    Period Weeks    Status On-going      PT LONG TERM GOAL #5   Title Improve FOTO to </= 28% limitation    Time 6    Period Weeks    Status On-going                 Plan - 01/06/20 7253    Clinical Impression Statement Pt has had positive response to DN and exercises thus far.  Reporting pain with certain movements and no longer constant.  Reviewed all exercises and trialed a few new ones.  Pt required minimal cues for posture and technique.  Pt reported no increase in pain in neck/shoulder during session.  Progressing well towards goals.    Rehab Potential Good    PT Frequency 2x / week    PT Duration 6 weeks    PT Treatment/Interventions ADLs/Self Care Home Management;Aquatic Therapy;Cryotherapy;Electrical Stimulation;Iontophoresis 4mg /ml Dexamethasone;Moist Heat;Ultrasound;Therapeutic activities;Therapeutic exercise;Neuromuscular re-education;Patient/family education;Manual techniques;Dry needling;Taping    PT Next Visit Plan cervical stretching; posterior shoulder girdle strengthening; modalities as indicated; using TENS unit for home    PT Home Exercise Plan [id number]    Consulted and Agree with Plan of Care Patient           Patient will benefit from skilled therapeutic intervention in order to improve the following deficits and impairments:  Increased fascial restricitons, Increased muscle spasms, Pain, Hypomobility, Impaired  flexibility, Improper body mechanics, Decreased mobility,  Decreased strength, Postural dysfunction  Visit Diagnosis: Cervicalgia  Other symptoms and signs involving the musculoskeletal system  Abnormal posture  Stiffness of neck     Problem List Patient Active Problem List   Diagnosis Date Noted  . Hyperlipidemia associated with type 2 diabetes mellitus ([place]) 12/22/2019  . Vitamin D deficiency 09/08/2019  . Low HDL (under 40) 09/08/2019  . Pancreatic insufficiency 03/28/2018  . Diabetes mellitus ([place]) 09/16/2017  . DVT (deep venous thrombosis), right 04/28/2015  . Acute deep vein thrombosis (DVT) of femoral vein of right lower extremity ([place]) 04/28/2015  . Neck injury 12/22/2013  . Hemorrhoid 03/23/2013  . Varicose vein of leg right 03/23/2013  . Visit for preventive health examination 03/23/2013  . Diastasis recti 12/22/2012  . Tendinitis of right shoulder 09/27/2011  . Right shoulder pain 09/27/2011  . Allergic rhinitis, cause unspecified 05/23/2011  . Sleep disturbance, unspecified 02/21/2011  . 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 Preventative health care 12/05/2010  . Chronic diarrhea of unknown origin pt says not from metformin 12/05/2010  . 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  . URINARY URGENCY 01/21/2009  . Hyperlipidemia 01/23/2008  . Obstructive sleep apnea 12/22/2007  . VARICOSE VEINS, LOWER EXTREMITIES 10/03/2007  . ASTHMA 09/19/2007  . ASTHMA UNSPECIFIED WITH EXACERBATION 09/19/2007  . PLANTAR FASCIITIS, LEFT 06/23/2007  . RASH AND OTHER NONSPECIFIC SKIN ERUPTION 05/26/2007  . EDEMA 05/26/2007  . Class 2 severe obesity with serious comorbidity and body mass index (BMI) of 38.0 to 38.9 in adult ([place]) 02/13/2007  . DVT, HX OF 01/15/2007  . 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   Kerin Perna, PTA 01/06/20 9:28 AM  [facility] [place], [place], [address] Phone:  [phone]   Fax:  [phone]  Name: Troy [name] MRN: [id number] Date of Birth: 03-06-1971

## 2020-01-11 ENCOUNTER — Ambulatory Visit (INDEPENDENT_AMBULATORY_CARE_PROVIDER_SITE_OTHER): Payer: BC Managed Care – PPO | Admitting: Rehabilitative and Restorative Service Providers"

## 2020-01-11 ENCOUNTER — Other Ambulatory Visit: Payer: Self-pay

## 2020-01-11 DIAGNOSIS — M436 Torticollis: Secondary | ICD-10-CM

## 2020-01-11 DIAGNOSIS — M542 Cervicalgia: Secondary | ICD-10-CM

## 2020-01-11 DIAGNOSIS — R293 Abnormal posture: Secondary | ICD-10-CM

## 2020-01-11 DIAGNOSIS — R29898 Other symptoms and signs involving the musculoskeletal system: Secondary | ICD-10-CM | POA: Diagnosis not present

## 2020-01-11 NOTE — Therapy (Signed)
[place], [place], [address] Phone: [phone]   Fax:  [phone]  Physical Therapy Treatment  Patient Details  Name: Troy [name] MRN: [id number] Date of Birth: [birth year] Referring Provider (PT): Dr [name]   Encounter Date: 01/11/2020   PT End of Session - 01/11/20 1617    Visit Number 4    Number of Visits 12    Date for PT Re-Evaluation 02/08/20    PT Start Time 0718    PT Stop Time 0802    PT Time Calculation (min) 44 min    Activity Tolerance Patient tolerated treatment well    Behavior During Therapy [facility] for tasks assessed/performed           Past Medical History:  Diagnosis Date  . Arm weakness    right   . Asthma   . B12 deficiency   . Back pain   . Diabetes insipidus ([place])   . Diabetes mellitus without complication ([place])   . DVT (deep venous thrombosis) ([place])   . Leg edema, right   . Male infertility   . Obesity   . Obesity   . OSA (obstructive sleep apnea)     Past Surgical History:  Procedure Laterality Date  . BACK SURGERY    . CERVICAL DISC SURGERY     C6/C7 2009  . cubital tunnel left arm     2003  . ELBOW SURGERY    . FIBULA FRACTURE SURGERY     plate & pin removed due to infection 1997  . ULNAR NERVE REPAIR      There were no vitals filed for this visit.   Subjective Assessment - 01/11/20 0718    Subjective The patient reports no pain at rest.  He has 5-6/10 pain with motion.  "That part hasn't changed much."    Pertinent History cerrvical disc fusion C7/T1 2008; fall with cervical whiplash ~ 5 yrs ago; fibular fx Rt LE with several surgeries; AODM; Nerve injury to Rt UE yrs ago with residual nerve damage: Has lost 22 # in the past 3-4 months    Currently in Pain? Yes    Pain Score 0-No pain   up to 5-6/10 with movement   Pain Location Neck    Pain Orientation Left    Pain Descriptors / Indicators Dull    Pain Type Acute pain    Pain Onset  More than a month ago    Pain Frequency Constant    Aggravating Factors  extension, lateral flexion    Pain Relieving Factors no pain at rest              [facility] PT Assessment - 01/11/20 0738      Assessment   Medical Diagnosis Cervical dysfunction    Referring Provider (PT) Dr [name]    Onset Date/Surgical Date 10/25/19    Hand Dominance Right      AROM   Cervical Flexion 50    Cervical Extension 46   no pain at this time   Cervical - Right Side Bend 26    Cervical - Left Side Bend 24    Cervical - Right Rotation 62    Cervical - Left Rotation 53   pain to left                        [facility] Adult PT Treatment/Exercise - 01/11/20 0738      Exercises  Exercises Neck;Shoulder      Neck Exercises: Seated   Other Seated Exercise Seated mobilization with movement into cervical extension    Other Seated Exercise Seated belt for shoulder depression with stretch to the R side/ upper trap first rib depression      Neck Exercises: Supine   Cervical Isometrics Right lateral flexion;Left lateral flexion    Neck Retraction 10 reps    Cervical Rotation Right;Left   PROM   Lateral Flexion Right;Left;5 reps      Neck Exercises: Prone   Other Prone Exercise prone on elbows performing flexion/extension C-spine without pain      Shoulder Exercises: Prone   Retraction Strengthening;Both;10 reps    Flexion Strengthening;Right;Left;10 reps    Flexion Limitations on elbows alternating UE reaching    Extension Strengthening;Both;10 reps      Manual Therapy   Manual Therapy Soft tissue mobilization;Joint mobilization    Manual therapy comments to improve ROM, decrease pain with movement; skilled palpation for tissue assessment during dry needling    Joint Mobilization CPA, thoracic and mid to upper c-spine    Soft tissue mobilization STM upper trapezius L side, scalenes and cervical paraspinals; gentle suboccipital release            Trigger Point Dry Needling -  01/11/20 0747    Consent Given? Yes    Education Handout Provided Previously provided    Muscles Treated Head and Neck Upper trapezius    Upper Trapezius Response Twitch reponse elicited;Palpable increased muscle length                     PT Long Term Goals - 01/06/20 0721      PT LONG TERM GOAL #1   Title Patient demonstrates improve posture and alignment with activation of posterior shoulder girdle and improved position of head and neck for functional activities    Time 6    Period Weeks    Status On-going      PT LONG TERM GOAL #2   Title Increase cervical ROM to [facility] and pain free in extension, lateral flexion, rotation    Time 6    Period Weeks    Status On-going      PT LONG TERM GOAL #3   Title Patient reports ability to turn head and tilt head to side functionally with no pain    Time 6    Period Weeks    Status On-going      PT LONG TERM GOAL #4   Title Independent in HEP    Time 6    Period Weeks    Status On-going      PT LONG TERM GOAL #5   Title Improve FOTO to </= 28% limitation    Time 6    Period Weeks    Status On-going                 Plan - 01/11/20 1618    Clinical Impression Statement The patient has less pain with cervical extension and continued pain with lateral flexion.  He is point tender over facet L mid c-spine and has palpable tenderness in splenius capitus, and upper traps.  He responded well to DN upper trap and mobilization with movement.  PT to continue working on strengthening in order to improve support for neck motion.    Rehab Potential Good    PT Frequency 2x / week    PT Duration 6 weeks    PT Treatment/Interventions ADLs/Self  Care Home Management;Aquatic Therapy;Cryotherapy;Electrical Stimulation;Iontophoresis 4mg /ml Dexamethasone;Moist Heat;Ultrasound;Therapeutic activities;Therapeutic exercise;Neuromuscular re-education;Patient/family education;Manual techniques;Dry needling;Taping    PT Next Visit Plan  cervical stretching; posterior shoulder girdle strengthening; modalities as indicated; using TENS unit for home    PT Home Exercise Plan [id number]    Consulted and Agree with Plan of Care Patient           Patient will benefit from skilled therapeutic intervention in order to improve the following deficits and impairments:  Increased fascial restricitons, Increased muscle spasms, Pain, Hypomobility, Impaired flexibility, Improper body mechanics, Decreased mobility, Decreased strength, Postural dysfunction  Visit Diagnosis: Cervicalgia  Other symptoms and signs involving the musculoskeletal system  Abnormal posture  Stiffness of neck     Problem List Patient Active Problem List   Diagnosis Date Noted  . Hyperlipidemia associated with type 2 diabetes mellitus ([place]) 12/22/2019  . Vitamin D deficiency 09/08/2019  . Low HDL (under 40) 09/08/2019  . Pancreatic insufficiency 03/28/2018  . Type 2 diabetes mellitus with complication, with long-term current use of insulin ([place]) 09/16/2017  . DVT (deep venous thrombosis), right 04/28/2015  . Acute deep vein thrombosis (DVT) of femoral vein of right lower extremity ([place]) 04/28/2015  . Neck injury 12/22/2013  . Hemorrhoid 03/23/2013  . Varicose vein of leg right 03/23/2013  . Visit for preventive health examination 03/23/2013  . Diastasis recti 12/22/2012  . Tendinitis of right shoulder 09/27/2011  . Right shoulder pain 09/27/2011  . Allergic rhinitis, cause unspecified 05/23/2011  . Sleep disturbance, unspecified 02/21/2011  . 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 Preventative health care 12/05/2010  . Chronic diarrhea of unknown origin pt says not from metformin 12/05/2010  . 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  . URINARY URGENCY 01/21/2009  . Hyperlipidemia 01/23/2008  . Obstructive sleep apnea 12/22/2007  . VARICOSE VEINS, LOWER EXTREMITIES 10/03/2007  . ASTHMA 09/19/2007  . ASTHMA UNSPECIFIED WITH EXACERBATION 09/19/2007    . PLANTAR FASCIITIS, LEFT 06/23/2007  . RASH AND OTHER NONSPECIFIC SKIN ERUPTION 05/26/2007  . EDEMA 05/26/2007  . Class 2 severe obesity with serious comorbidity and body mass index (BMI) of 38.0 to 38.9 in adult ([place]) 02/13/2007  . DVT, HX OF 01/15/2007  . 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    Neela Zecca,PT 01/11/2020, 4:19 PM  [facility] [place], [place], [address] Phone: [phone]   Fax:  [phone]  Name: Troy [name] MRN: [id number] Date of Birth: 1971-02-21

## 2020-01-13 ENCOUNTER — Ambulatory Visit (INDEPENDENT_AMBULATORY_CARE_PROVIDER_SITE_OTHER): Payer: BC Managed Care – PPO | Admitting: Rehabilitative and Restorative Service Providers"

## 2020-01-13 ENCOUNTER — Other Ambulatory Visit: Payer: Self-pay

## 2020-01-13 DIAGNOSIS — R29898 Other symptoms and signs involving the musculoskeletal system: Secondary | ICD-10-CM | POA: Diagnosis not present

## 2020-01-13 DIAGNOSIS — R293 Abnormal posture: Secondary | ICD-10-CM | POA: Diagnosis not present

## 2020-01-13 DIAGNOSIS — M436 Torticollis: Secondary | ICD-10-CM

## 2020-01-13 DIAGNOSIS — M542 Cervicalgia: Secondary | ICD-10-CM

## 2020-01-13 NOTE — Patient Instructions (Signed)
Access Code: [email or web address] URL: [email or web address] Date: 01/13/2020 Prepared by: Rudell Cobb  Exercises Seated Cervical Retraction - 2 x daily - 7 x weekly - 1-2 sets - 5-10 reps - 10 sec hold Seated Scapular Retraction - 2 x daily - 7 x weekly - 1-2 sets - 10 reps - 10 sec hold Shoulder External Rotation in 45 Degrees Abduction - 2 x daily - 7 x weekly - 1-2 sets - 10 reps - 3 sec hold Standing Bilateral Low Shoulder Row with Anchored Resistance - 2 x daily - 7 x weekly - 1-3 sets - 10 reps - 2-3 sec hold Shoulder Extension with Resistance - 2 x daily - 7 x weekly - 1-3 sets - 10 reps - 2-3 sec hold Standing Shoulder Horizontal and Diagonal Pulls with Resistance - 2 x daily - 7 x weekly - 1 sets - 10 reps Standing with Forearms Thoracic Rotation - 2 x daily - 7 x weekly - 1 sets - 10 reps Standing Pec Stretch at Wall - 2 x daily - 7 x weekly - 1 sets - 10 reps Doorway Pec Stretch at 90 Degrees Abduction - 3 x daily - 7 x weekly - 3 reps - 1 sets - 30 seconds hold Doorway Pec Stretch at 120 Degrees Abduction - 3 x daily - 7 x weekly - 3 reps - 1 sets - 30 second hold hold

## 2020-01-13 NOTE — Therapy (Signed)
[facility] [place], [place], [address] Phone: [phone]   Fax:  [phone]  Physical Therapy Treatment  Patient Details  Name: Troy [name] MRN: [id number] Date of Birth: 11-01-1970 Referring Provider (PT): Dr [name]   Encounter Date: 01/13/2020   PT End of Session - 01/13/20 1323    Visit Number 5    Number of Visits 12    Date for PT Re-Evaluation 02/08/20    PT Start Time 0717    PT Stop Time 0800    PT Time Calculation (min) 43 min    Activity Tolerance Patient tolerated treatment well    Behavior During Therapy [facility] for tasks assessed/performed           Past Medical History:  Diagnosis Date  . Arm weakness    right   . Asthma   . B12 deficiency   . Back pain   . Diabetes insipidus ([place])   . Diabetes mellitus without complication ([place])   . DVT (deep venous thrombosis) ([place])   . Leg edema, right   . Male infertility   . Obesity   . Obesity   . OSA (obstructive sleep apnea)     Past Surgical History:  Procedure Laterality Date  . BACK SURGERY    . CERVICAL DISC SURGERY     C6/C7 2009  . cubital tunnel left arm     2003  . ELBOW SURGERY    . FIBULA FRACTURE SURGERY     plate & pin removed due to infection 1997  . ULNAR NERVE REPAIR      There were no vitals filed for this visit.   Subjective Assessment - 01/13/20 0719    Subjective The patient reports 3/10 pain today.  There is still one spot that is point tender on his neck with palpation.    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 Yes    Pain Score 3     Pain Location Neck    Pain Orientation Left    Pain Descriptors / Indicators Tightness;Aching;Sore    Pain Type Acute pain    Pain Onset More than a  month ago    Aggravating Factors  lateral flexion    Pain Relieving Factors not as painful at rest              [facility] PT Assessment - 01/13/20 0720      Assessment   Medical Diagnosis Cervical dysfunction    Referring Provider (PT) Dr [name]    Onset Date/Surgical Date 10/25/19    Hand Dominance Right                         [facility] Adult PT Treatment/Exercise - 01/13/20 0720      Exercises   Exercises Neck;Shoulder      Neck Exercises: Standing   Neck Retraction 10 reps    Neck Retraction Limitations with ball behind head for tactile cues    Wall Push Ups 10 reps    Wall Push Ups Limitations elbow scapular press up for retraction;protraction      Neck Exercises: Seated   Cervical Isometrics Right lateral flexion;Left lateral flexion  Neck Exercises: Supine   Cervical Isometrics Right lateral flexion;Left lateral flexion;Extension;Right rotation;Left rotation;5 secs;5 reps    Neck Retraction 10 reps    Cervical Rotation Right;Left;5 reps    Cervical Rotation Limitations with chin tuck    Lateral Flexion --   PROM within tolerancce   Lateral Flexion Limitations *L sidebend is painful at C4 lateral facet      Neck Exercises: Sidelying   Lateral Flexion Right;Left;5 reps;10 reps    Lateral Flexion Limitations 10 reps to the R and 5 reps to the L (pain to the left)      Shoulder Exercises: Standing   Horizontal ABduction Strengthening;Right;Left    Theraband Level (Shoulder Horizontal ABduction) Level 3 (Green)    External Rotation Strengthening;Both;10 reps    Retraction Strengthening;Both;10 reps    Diagonals Strengthening;Both;10 reps    Theraband Level (Shoulder Diagonals) Level 3 (Green)    Other Standing Exercises thoracic       Shoulder Exercises: ROM/Strengthening   UBE (Upper Arm Bike) L2 x 1.5 minutes forward/1.5 minutes backward      Shoulder Exercises: Stretch   Corner Stretch Limitations door frame str etch    Wall Stretch -  ABduction 2 reps;30 seconds    Wall Stretch - ABduction Limitations L UE      Manual Therapy   Manual Therapy Soft tissue mobilization;Joint mobilization;Muscle Energy Technique;Passive ROM    Manual therapy comments to improve ROM and decrease pain    Joint Mobilization CPA mid c-spine Grade II; facet pain is one level above fusion    Soft tissue mobilization STM upper trap, scalenes, splenius capitus and cervical paraspinals    Passive ROM PROM with isometric holds for lateral flexion and rotation + extension    Muscle Energy Technique contract/relax during ROM                  PT Education - 01/13/20 0757    Education Details HEP    Person(s) Educated Patient    Methods Explanation;Demonstration;Handout    Comprehension Verbalized understanding;Returned demonstration               PT Long Term Goals - 01/06/20 0721      PT LONG TERM GOAL #1   Title Patient demonstrates improve posture and alignment with activation of posterior shoulder girdle and improved position of head and neck for functional activities    Time 6    Period Weeks    Status On-going      PT LONG TERM GOAL #2   Title Increase cervical ROM to [facility] and pain free in extension, lateral flexion, rotation    Time 6    Period Weeks    Status On-going      PT LONG TERM GOAL #3   Title Patient reports ability to turn head and tilt head to side functionally with no pain    Time 6    Period Weeks    Status On-going      PT LONG TERM GOAL #4   Title Independent in HEP    Time 6    Period Weeks    Status On-going      PT LONG TERM GOAL #5   Title Improve FOTO to </= 28% limitation    Time 6    Period Weeks    Status On-going                 Plan - 01/13/20 1338    Clinical Impression Statement The patient's  resting pain is decreased, however point tenderness and pain with L lateral flexion is more intense.  Pain appears localized to C4 facet region with surrounding muscle guarding.   Mobs are limited at this level as he has a h/o c-spine fusion C6-C7.  PT using muscle energy and cervical isometrics to work on reducing pain.    Rehab Potential Good    PT Frequency 2x / week    PT Duration 6 weeks    PT Treatment/Interventions ADLs/Self Care Home Management;Aquatic Therapy;Cryotherapy;Electrical Stimulation;Iontophoresis 4mg /ml Dexamethasone;Moist Heat;Ultrasound;Therapeutic activities;Therapeutic exercise;Neuromuscular re-education;Patient/family education;Manual techniques;Dry needling;Taping    PT Next Visit Plan cervical isometrics and muscle energy to reduce muscle guarding L paraspinal (cervical) musculature; cervical stretching; modalities as indicated; using TENS unit for home    PT Home Exercise Plan [email or web address]    Consulted and Agree with Plan of Care Patient           Patient will benefit from skilled therapeutic intervention in order to improve the following deficits and impairments:  Increased fascial restricitons, Increased muscle spasms, Pain, Hypomobility, Impaired flexibility, Improper body mechanics, Decreased mobility, Decreased strength, Postural dysfunction  Visit Diagnosis: Cervicalgia  Other symptoms and signs involving the musculoskeletal system  Abnormal posture  Stiffness of neck     Problem List Patient Active Problem List   Diagnosis Date Noted  . Hyperlipidemia associated with type 2 diabetes mellitus ([place]) 12/22/2019  . Vitamin D deficiency 09/08/2019  . Low HDL (under 40) 09/08/2019  . Pancreatic insufficiency 03/28/2018  . Type 2 diabetes mellitus with complication, with long-term current use of insulin ([place]) 09/16/2017  . DVT (deep venous thrombosis), right 04/28/2015  . Acute deep vein thrombosis (DVT) of femoral vein of right lower extremity ([place]) 04/28/2015  . Neck injury 12/22/2013  . Hemorrhoid 03/23/2013  . Varicose vein of leg right 03/23/2013  . Visit for preventive health examination 03/23/2013  . Diastasis recti  12/22/2012  . Tendinitis of right shoulder 09/27/2011  . Right shoulder pain 09/27/2011  . Allergic rhinitis, cause unspecified 05/23/2011  . Sleep disturbance, unspecified 02/21/2011  . 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 Preventative health care 12/05/2010  . Chronic diarrhea of unknown origin pt says not from metformin 12/05/2010  . 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  . URINARY URGENCY 01/21/2009  . Hyperlipidemia 01/23/2008  . Obstructive sleep apnea 12/22/2007  . VARICOSE VEINS, LOWER EXTREMITIES 10/03/2007  . ASTHMA 09/19/2007  . ASTHMA UNSPECIFIED WITH EXACERBATION 09/19/2007  . PLANTAR FASCIITIS, LEFT 06/23/2007  . RASH AND OTHER NONSPECIFIC SKIN ERUPTION 05/26/2007  . EDEMA 05/26/2007  . Class 2 severe obesity with serious comorbidity and body mass index (BMI) of 38.0 to 38.9 in adult ([place]) 02/13/2007  . DVT, HX OF 01/15/2007  . 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    Yameli Delamater, PT 01/13/2020, 1:40 PM  [facility] [place], [place], [address] Phone: [phone]   Fax:  [phone]  Name: SOURISH ALLENDER MRN: [id number] Date of Birth: 25-Oct-1970

## 2020-01-21 NOTE — Progress Notes (Signed)
I, Troy [name], LAT, ATC acting as a scribe for Troy Leader, MD.  Troy [name] is a 49 y.o. male who presents to [place] at [facility] today for f/u L lateral neck pain and R cervical radiculopathy. Pt was last seen by Dr. [name] on 12/14/19 and was advised to begin PT of which he's completed 5 visits, but feels like he is not improving.  Dr. [name] also advised if not better plan for MRI for epidural steroid injection. Today, pt reports ROM is back, but the pn is worse. Pn w/ lifting head off pillow. 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  He notes new numbness tingling and weakness into his right hand radial aspect.  UE numbness/tingling: R hand, 1st and 2nd digits   Dx imaging: 12/14/19 C-spine XR  Pertinent review of systems: No fevers or chills  Relevant historical information: History ACDF at C7-T1   Exam:  BP (!) 142/78 (BP Location: Left Arm, Patient Position: Sitting, Cuff Size: Large)   Pulse 66   Ht 5\' 11"  (1.803 m)   Wt 287 lb 12.8 oz (130.5 kg)   SpO2 96%   BMI 40.14 kg/m  General: Well Developed, well nourished, and in no acute distress.   MSK: C-spine normal. Nontender midline normal cervical motion. Upper extremity strength testing: Intact bilateral upper extremities shoulder and tricep and bicep. Right hand obviously diminished to finger abduction and adduction. Slight diminished to thumb and second digit extension. Grip diminished. Left arm strength testing intact and normal throughout. Reflexes diminished right side normal left. Decreased sensation right side along ulnar aspect of hand as expected Normal left   Lab and Radiology Results EXAM: CERVICAL SPINE - COMPLETE 4+ VIEW  COMPARISON:  04/19/2008  FINDINGS: Mild progression multilevel degenerative disc disease, greatest at C5-C6 and C6-C7. Anteriorly directed osteophytes. Postsurgical changes of C7-T1 ACDF with  interbody spacer. No evidence of hardware fracture. Limited evaluation of the disc space given overlapping soft tissues at the cervicothoracic junction. No evidence of acute fracture. Vertebral body heights are maintained. No substantial subluxation. Prevertebral soft tissues are within normal limits. Visualized lung apices are unremarkable.  IMPRESSION: 1. No evidence of acute fracture or malalignment. 2. ACDF at C7-T1. Mild progression of degenerative disc disease at C5-C6 and C6-C7.   Electronically Signed   By: Margaretha Sheffield MD   On: 12/14/2019 09:05  I, Troy [name], personally (independently) visualized and performed the interpretation of the images attached in this note. Neuroforaminal stenosis right C5-C6 and C6-C7 with DJD facet and DDD    Assessment and Plan: 49 y.o. male with bilateral neck pain failing typical conservative management with new right arm weakness. However detailed neurologic assessment is complicated by his prior ulnar and medial nerve injury. However he does have new neurologic symptoms of dysfunction into the right hand along the radial aspect of his hand which is consistent with C6 and C7 radiculopathy. Based on his failure to improve with physical therapy and now his new progressive right-sided neurologic symptoms we'll proceed with MRI to further characterize pain and for potential injection or surgical planning. Recheck following MRI.   PDMP not reviewed this encounter. Orders Placed This Encounter  Procedures  . MR CERVICAL SPINE WO CONTRAST    Standing Status:   Future    Standing Expiration Date:   01/21/2021    Order Specific Question:   What is the patient's sedation requirement?    Answer:  No Sedation    Order Specific Question:   Does the patient have a pacemaker or implanted devices?    Answer:   No    Order Specific Question:   Preferred imaging location?    Answer:   Product/process development scientist (table limit-350lbs)   No orders of  the defined types were placed in this encounter.    Discussed warning signs or symptoms. Please see discharge instructions. Patient expresses understanding.   The above documentation has been reviewed and is accurate and complete Troy [name], M.D.

## 2020-01-22 ENCOUNTER — Other Ambulatory Visit: Payer: Self-pay

## 2020-01-22 ENCOUNTER — Ambulatory Visit (INDEPENDENT_AMBULATORY_CARE_PROVIDER_SITE_OTHER): Payer: BC Managed Care – PPO | Admitting: Family Medicine

## 2020-01-22 ENCOUNTER — Encounter: Payer: Self-pay | Admitting: Physical Therapy

## 2020-01-22 ENCOUNTER — Ambulatory Visit (INDEPENDENT_AMBULATORY_CARE_PROVIDER_SITE_OTHER): Payer: BC Managed Care – PPO | Admitting: Physical Therapy

## 2020-01-22 ENCOUNTER — Encounter: Payer: Self-pay | Admitting: Family Medicine

## 2020-01-22 VITALS — BP 142/78 | HR 66 | Ht 71.0 in | Wt 287.8 lb

## 2020-01-22 DIAGNOSIS — M5412 Radiculopathy, cervical region: Secondary | ICD-10-CM

## 2020-01-22 DIAGNOSIS — M542 Cervicalgia: Secondary | ICD-10-CM | POA: Diagnosis not present

## 2020-01-22 DIAGNOSIS — R29898 Other symptoms and signs involving the musculoskeletal system: Secondary | ICD-10-CM | POA: Diagnosis not present

## 2020-01-22 DIAGNOSIS — M62838 Other muscle spasm: Secondary | ICD-10-CM

## 2020-01-22 DIAGNOSIS — M436 Torticollis: Secondary | ICD-10-CM

## 2020-01-22 DIAGNOSIS — R293 Abnormal posture: Secondary | ICD-10-CM

## 2020-01-22 NOTE — Therapy (Signed)
[place], [place], [address] Phone: [phone]   Fax:  [phone]  Physical Therapy Treatment  Patient Details  Name: Troy [name] MRN: [id number] Date of Birth: [birth year] Referring Provider (PT): Dr [name]   Encounter Date: 01/22/2020   PT End of Session - 01/22/20 0938    Visit Number 6    Number of Visits 12    Date for PT Re-Evaluation 02/08/20    PT Start Time 0936    PT Stop Time 1014    PT Time Calculation (min) 38 min    Activity Tolerance Patient tolerated treatment well    Behavior During Therapy [facility] for tasks assessed/performed           Past Medical History:  Diagnosis Date  . Arm weakness    right   . Asthma   . B12 deficiency   . Back pain   . Diabetes insipidus ([place])   . Diabetes mellitus without complication ([place])   . DVT (deep venous thrombosis) ([place])   . Leg edema, right   . Male infertility   . Obesity   . Obesity   . OSA (obstructive sleep apnea)     Past Surgical History:  Procedure Laterality Date  . BACK SURGERY    . CERVICAL DISC SURGERY     C6/C7 2009  . cubital tunnel left arm     2003  . ELBOW SURGERY    . FIBULA FRACTURE SURGERY     plate & pin removed due to infection 1997  . ULNAR NERVE REPAIR      There were no vitals filed for this visit.   Subjective Assessment - 01/22/20 0938    Subjective Pt reports his ROM has improved, but the ache in his Lt side of neck hasn't improved much. He reports the DN has helped.  "It (pain) hasn't really ever gone away".  He returns to doctor today.    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 Yes    Pain Score 1     Pain Location Neck    Pain Orientation Left    Pain Descriptors /  Indicators Aching    Aggravating Factors  lateral flexion Lt, lifting head from pillow    Pain Relieving Factors no pain at rest.              [facility] PT Assessment - 01/22/20 0001      Assessment   Medical Diagnosis Cervical dysfunction    Referring Provider (PT) Dr [name]    Onset Date/Surgical Date 10/25/19    Hand Dominance Right    Next MD Visit 01/22/20      Observation/Other Assessments   Focus on Therapeutic Outcomes (FOTO)  33% limitation      AROM   Cervical Flexion 50    Cervical Extension 43    Cervical - Right Side Bend 33    Cervical - Left Side Bend 25   pain at end range   Cervical - Right Rotation 60    Cervical - Left Rotation 55   with pain            [facility] Adult PT Treatment/Exercise - 01/22/20 0001  Shoulder Exercises: Standing   Diagonals Strengthening;Both;10 reps    Theraband Level (Shoulder Diagonals) Level 3 (Green)    Other Standing Exercises thoracic rotation with forearms on wall x 5 reps each way, holding 5 sec.  Reverse wall push ups with focus on scap retraction x 10, 5 sec hold each.  horiz abdct with green band x 5 reps       Shoulder Exercises: ROM/Strengthening   UBE (Upper Arm Bike) L2 x 1.5 minutes forward/1.5 minutes backward      Shoulder Exercises: Stretch   Other Shoulder Stretches doorway stretch 30 se x 2 reps 3 positions      Manual Therapy   Soft tissue mobilization IASTM to Lt upper trap, scalenes, cervical paraspinals, and levator -both static and dynamic movement of neck.       Neck Exercises: Stretches   Other Neck Stretches Lateral flexion stretch, holding chair x 20 sec x 3 reps             PT Long Term Goals - 01/22/20 0944      PT LONG TERM GOAL #1   Title Patient demonstrates improve posture and alignment with activation of posterior shoulder girdle and improved position of head and neck for functional activities    Time 6    Period Weeks    Status Partially Met      PT LONG TERM GOAL #2    Title Increase cervical ROM to [facility] and pain free in extension, lateral flexion, rotation    Time 6    Period Weeks    Status Partially Met      PT LONG TERM GOAL #3   Title Patient reports ability to turn head and tilt head to side functionally with no pain    Time 6    Period Weeks    Status Partially Met      PT LONG TERM GOAL #4   Title Independent in HEP    Time 6    Period Weeks    Status Partially Met      PT LONG TERM GOAL #5   Title Improve FOTO to </= 28% limitation    Time 6    Period Weeks    Status On-going                 Plan - 01/22/20 1016    Clinical Impression Statement Pt's cervical ROM has improved since eval; doubled his range in lateral flexion.  Pain continues to appear localized to C4 facet region.  Pt tolerated all exercises well, requires minimal cues for form. Improved FOTO score, but goal not met.  Pt has partially met his goals.    Rehab Potential Good    PT Frequency 2x / week    PT Duration 6 weeks    PT Treatment/Interventions ADLs/Self Care Home Management;Aquatic Therapy;Cryotherapy;Electrical Stimulation;Iontophoresis 70m/ml Dexamethasone;Moist Heat;Ultrasound;Therapeutic activities;Therapeutic exercise;Neuromuscular re-education;Patient/family education;Manual techniques;Dry needling;Taping    PT Next Visit Plan cervical isometrics and muscle energy to reduce muscle guarding L paraspinal (cervical) musculature; cervical stretching; modalities as indicated; using TENS unit for home    PT Home Exercise Plan [email or web address]    Consulted and Agree with Plan of Care Patient           Patient will benefit from skilled therapeutic intervention in order to improve the following deficits and impairments:  Increased fascial restricitons, Increased muscle spasms, Pain, Hypomobility, Impaired flexibility, Improper body mechanics, Decreased mobility, Decreased strength, Postural dysfunction  Visit Diagnosis: Cervicalgia  Other  symptoms and signs  involving the musculoskeletal system  Abnormal posture  Stiffness of neck     Problem List Patient Active Problem List   Diagnosis Date Noted  . Hyperlipidemia associated with type 2 diabetes mellitus ([place]) 12/22/2019  . Vitamin D deficiency 09/08/2019  . Low HDL (under 40) 09/08/2019  . Pancreatic insufficiency 03/28/2018  . Type 2 diabetes mellitus with complication, with long-term current use of insulin ([place]) 09/16/2017  . DVT (deep venous thrombosis), right 04/28/2015  . Acute deep vein thrombosis (DVT) of femoral vein of right lower extremity ([place]) 04/28/2015  . Neck injury 12/22/2013  . Hemorrhoid 03/23/2013  . Varicose vein of leg right 03/23/2013  . Visit for preventive health examination 03/23/2013  . Diastasis recti 12/22/2012  . Tendinitis of right shoulder 09/27/2011  . Right shoulder pain 09/27/2011  . Allergic rhinitis, cause unspecified 05/23/2011  . Sleep disturbance, unspecified 02/21/2011  . 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 Preventative health care 12/05/2010  . Chronic diarrhea of unknown origin pt says not from metformin 12/05/2010  . 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  . URINARY URGENCY 01/21/2009  . Hyperlipidemia 01/23/2008  . Obstructive sleep apnea 12/22/2007  . VARICOSE VEINS, LOWER EXTREMITIES 10/03/2007  . ASTHMA 09/19/2007  . ASTHMA UNSPECIFIED WITH EXACERBATION 09/19/2007  . PLANTAR FASCIITIS, LEFT 06/23/2007  . RASH AND OTHER NONSPECIFIC SKIN ERUPTION 05/26/2007  . EDEMA 05/26/2007  . Class 2 severe obesity with serious comorbidity and body mass index (BMI) of 38.0 to 38.9 in adult ([place]) 02/13/2007  . DVT, HX OF 01/15/2007  . 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   Kerin Perna, PTA 01/22/20 10:21 AM  [name], [place], [address] Phone: [phone]   Fax:  [phone]  Name: Troy [name] MRN: [id number] Date of Birth:  22-May-1970

## 2020-01-22 NOTE — Patient Instructions (Addendum)
Thank you for coming in today.  You should hear from MRI scheduling within 1 week. If you do not hear please let me know.   Recheck with me following MRI.

## 2020-01-25 ENCOUNTER — Encounter (INDEPENDENT_AMBULATORY_CARE_PROVIDER_SITE_OTHER): Payer: Self-pay | Admitting: Adult Health

## 2020-01-25 ENCOUNTER — Other Ambulatory Visit: Payer: Self-pay

## 2020-01-25 ENCOUNTER — Ambulatory Visit (INDEPENDENT_AMBULATORY_CARE_PROVIDER_SITE_OTHER): Payer: BC Managed Care – PPO | Admitting: Adult Health

## 2020-01-25 VITALS — BP 110/68 | HR 82 | Temp 97.7°F | Ht 71.0 in | Wt 278.0 lb

## 2020-01-25 DIAGNOSIS — M5410 Radiculopathy, site unspecified: Secondary | ICD-10-CM | POA: Diagnosis not present

## 2020-01-25 DIAGNOSIS — E1169 Type 2 diabetes mellitus with other specified complication: Secondary | ICD-10-CM

## 2020-01-25 DIAGNOSIS — E785 Hyperlipidemia, unspecified: Secondary | ICD-10-CM

## 2020-01-25 DIAGNOSIS — E118 Type 2 diabetes mellitus with unspecified complications: Secondary | ICD-10-CM | POA: Diagnosis not present

## 2020-01-25 DIAGNOSIS — Z794 Long term (current) use of insulin: Secondary | ICD-10-CM

## 2020-01-25 DIAGNOSIS — Z6838 Body mass index (BMI) 38.0-38.9, adult: Secondary | ICD-10-CM

## 2020-01-25 NOTE — Progress Notes (Signed)
Chief Complaint:   OBESITY Troy [name] is here to discuss his progress with his obesity treatment plan along with follow-up of his obesity related diagnoses. Kainoah is on the Category 3 Plan and states he is following his eating plan approximately 80% of the time. Ernst states he is doing PT 60 minutes 2 times per week.  Today's visit was #: 10 Starting weight: 293 lbs Starting date: 09/07/2019 Today's weight: 278 lbs Today's date: 01/25/2020 Total lbs lost to date: 15 Total lbs lost since last in-office visit: 0  Interim History: Lorren attended his cousin's wedding in [place] last weekend. The event was 3 days of celebration. He was up 282 lbs at one point due to traveling and celebration. Since returning home, he has been consistently following the Category 3 meal plan and has lost several lbs of the "wedding weight."  Subjective:   Hyperlipidemia associated with type 2 diabetes mellitus ([place]). Lipid panel on 12/21/2019 showed LDL 136, well above goal of 70. Sharrieff had been off atorvastatin 40 mg for 4 weeks at the time of the lipid check. He was restarted on statin therapy 01/04/2020, which he is tolerating well.   Lab Results  Component Value Date   CHOL 188 12/21/2019   HDL 35 (L) 12/21/2019   LDLCALC 136 (H) 12/21/2019   LDLDIRECT 125.0 12/26/2018   TRIG 92 12/21/2019   CHOLHDL 5.4 (H) 12/21/2019   Lab Results  Component Value Date   ALT 18 12/21/2019   AST 11 12/21/2019   ALKPHOS 71 12/21/2019   BILITOT 0.5 12/21/2019   The 10-year ASCVD risk score [name], et al., 2013) is: 6.2%   Values used to calculate the score:     Age: 49 years     Sex: Male     Is Non-Hispanic African American: No     Diabetic: Yes     Tobacco smoker: No     Systolic Blood Pressure: 025 mmHg     Is BP treated: No     HDL Cholesterol: 35 mg/dL     Total Cholesterol: 188 mg/dL  Type 2 diabetes mellitus with complication, with long-term current use of insulin ([place]).  Kamarie is on metformin, Semaglutide, Humalog, and Invokana and is being managed by Dr. Cruzita Lederer Endocrinology every 4 months. He denies episodes of hypoglycemia. He will need to remove Elenor Legato system for MRI scheduled on 01/30/2020.   Lab Results  Component Value Date   HGBA1C 5.9 (A) 11/30/2019   HGBA1C 6.4 (A) 07/27/2019   HGBA1C 7.5 (A) 04/27/2019   Lab Results  Component Value Date   MICROALBUR 0.8 12/26/2018   LDLCALC 136 (H) 12/21/2019   CREATININE 0.74 (L) 12/21/2019   No results found for: INSULIN  Radiculopathy, unspecified spinal region. Solly has experienced increased right hand numbness and decreased strength. He was seen by Sports Medicine and MRI was ordered, which will be completed 01/30/2020. He will continue with PT as directed.   Assessment/Plan:   Hyperlipidemia associated with type 2 diabetes mellitus ([place]). Cardiovascular risk and specific lipid/LDL goals reviewed.  We discussed several lifestyle modifications today and Sundance will continue to work on diet, exercise and weight loss efforts. Orders and follow up as documented in patient record. He will continue daily atorvastatin 40 mg as directed.   Counseling Intensive lifestyle modifications are the first line treatment for this issue.  Dietary changes: Increase soluble fiber. Decrease simple carbohydrates.  Exercise changes: Moderate to vigorous-intensity aerobic activity 150 minutes  per week if tolerated.  Lipid-lowering medications: see documented in medical record.  Type 2 diabetes mellitus with complication, with long-term current use of insulin ([place]). Good blood sugar control is important to decrease the likelihood of diabetic complications such as nephropathy, neuropathy, limb loss, blindness, coronary artery disease, and death. Intensive lifestyle modification including diet, exercise and weight loss are the first line of treatment for diabetes. Rhyatt will continue his current antidiabetic  prescription medication as directed and continue close follow-up with Endocrinology as scheduled.  Radiculopathy, unspecified spinal region. Eleanor will have MRI on 01/30/2020 and follow-up with Sports Medicine as scheduled.  Class 2 severe obesity with serious comorbidity and body mass index (BMI) of 38.0 to 38.9 in adult, unspecified obesity type ([place]).  Macallan is currently in the action stage of change. As such, his goal is to continue with weight loss efforts. He has agreed to the Category 3 Plan.   Handout was provided on Thanksgiving.  Exercise goals: Tennyson will continue PT 60 minutes 2 times per week.  Behavioral modification strategies: increasing lean protein intake, meal planning and cooking strategies, better snacking choices, travel eating strategies, celebration eating strategies and planning for success.  Kazi has agreed to follow-up with our clinic in 2 weeks. He was informed of the importance of frequent follow-up visits to maximize his success with intensive lifestyle modifications for his multiple health conditions.   Objective:   Blood pressure 110/68, pulse 82, temperature 97.7 F (36.5 C), height 5\' 11"  (1.803 m), weight 278 lb (126.1 kg), SpO2 98 %. Body mass index is 38.77 kg/m.  General: Cooperative, alert, well developed, in no acute distress. HEENT: Conjunctivae and lids unremarkable. Cardiovascular: Regular rhythm.  Lungs: Normal work of breathing. Neurologic: No focal deficits.   Lab Results  Component Value Date   CREATININE 0.74 (L) 12/21/2019   BUN 13 12/21/2019   NA 142 12/21/2019   K 4.8 12/21/2019   CL 103 12/21/2019   CO2 26 12/21/2019   Lab Results  Component Value Date   ALT 18 12/21/2019   AST 11 12/21/2019   ALKPHOS 71 12/21/2019   BILITOT 0.5 12/21/2019   Lab Results  Component Value Date   HGBA1C 5.9 (A) 11/30/2019   HGBA1C 6.4 (A) 07/27/2019   HGBA1C 7.5 (A) 04/27/2019   HGBA1C 6.7 (A) 12/26/2018   HGBA1C 7.3 (A)  09/16/2017   No results found for: INSULIN Lab Results  Component Value Date   TSH 1.32 04/27/2019   Lab Results  Component Value Date   CHOL 188 12/21/2019   HDL 35 (L) 12/21/2019   LDLCALC 136 (H) 12/21/2019   LDLDIRECT 125.0 12/26/2018   TRIG 92 12/21/2019   CHOLHDL 5.4 (H) 12/21/2019   Lab Results  Component Value Date   WBC 8.7 02/02/2019   HGB 16.0 02/02/2019   HCT 46.8 02/02/2019   MCV 94.8 02/02/2019   PLT 233.0 02/02/2019   Lab Results  Component Value Date   FERRITIN 141.3 07/22/2017   Attestation Statements:   Reviewed by clinician on day of visit: allergies, medications, problem list, medical history, surgical history, family history, social history, and previous encounter notes.  Time spent on visit including pre-visit chart review and post-visit charting and care was 29 minutes.   I, Michaelene Song, am acting as Location manager for [employer], NP-C   I have reviewed the above documentation for accuracy and completeness, and I agree with the above. - Stasha Naraine d. Atsushi Yom, NP-C

## 2020-01-27 ENCOUNTER — Ambulatory Visit (INDEPENDENT_AMBULATORY_CARE_PROVIDER_SITE_OTHER): Payer: BC Managed Care – PPO | Admitting: Rehabilitative and Restorative Service Providers"

## 2020-01-27 ENCOUNTER — Other Ambulatory Visit: Payer: Self-pay

## 2020-01-27 ENCOUNTER — Encounter: Payer: Self-pay | Admitting: Rehabilitative and Restorative Service Providers"

## 2020-01-27 DIAGNOSIS — M436 Torticollis: Secondary | ICD-10-CM | POA: Diagnosis not present

## 2020-01-27 DIAGNOSIS — R29898 Other symptoms and signs involving the musculoskeletal system: Secondary | ICD-10-CM | POA: Diagnosis not present

## 2020-01-27 DIAGNOSIS — R293 Abnormal posture: Secondary | ICD-10-CM

## 2020-01-27 DIAGNOSIS — M542 Cervicalgia: Secondary | ICD-10-CM | POA: Diagnosis not present

## 2020-01-27 NOTE — Therapy (Signed)
[place], [place], [address] Phone: [phone]   Fax:  [phone]  Physical Therapy Treatment  Patient Details  Name: Troy [name] MRN: [id number] Date of Birth: 1970-06-18 Referring Provider (PT): Dr [name]   Encounter Date: 01/27/2020   PT End of Session - 01/27/20 0849    Visit Number 7    Number of Visits 12    Date for PT Re-Evaluation 02/08/20    PT Start Time 0720    PT Stop Time 0802    PT Time Calculation (min) 42 min    Activity Tolerance Patient tolerated treatment well    Behavior During Therapy [facility] for tasks assessed/performed           Past Medical History:  Diagnosis Date  . Arm weakness    right   . Asthma   . B12 deficiency   . Back pain   . Diabetes insipidus ([place])   . Diabetes mellitus without complication ([name])   . DVT (deep venous thrombosis) ([place])   . Leg edema, right   . Male infertility   . Obesity   . Obesity   . OSA (obstructive sleep apnea)     Past Surgical History:  Procedure Laterality Date  . BACK SURGERY    . CERVICAL DISC SURGERY     C6/C7 2009  . cubital tunnel left arm     2003  . ELBOW SURGERY    . FIBULA FRACTURE SURGERY     plate & pin removed due to infection 1997  . ULNAR NERVE REPAIR      There were no vitals filed for this visit.   Subjective Assessment - 01/27/20 0722    Subjective The patient reports he lifted his head from the pillow today and noted increased pain.  He has numbness R lateral 2nd digit and has pain L c-spine.  ROM is almost back to normal per patient.    Pertinent History cerrvical disc fusion C7/T1 2008; fall with cervical whiplash ~ 5 yrs ago; fibular fx Rt LE with several surgeries; AODM; Nerve injury to Rt UE yrs ago with residual nerve damage: Has lost 22 # in the past 3-4 months    Patient Stated Goals reduce pain c-spine    Currently in Pain? Yes    Pain Score 1     Pain Location Neck    Pain  Orientation Left    Pain Descriptors / Indicators Aching    Pain Onset More than a month ago    Pain Frequency Constant    Aggravating Factors  lifting head from pillow goes up to 9/10    Pain Relieving Factors mild pain at rest              [facility] PT Assessment - 01/27/20 0725      Assessment   Medical Diagnosis Cervical dysfunction    Referring Provider (PT) Dr [name]    Onset Date/Surgical Date 10/25/19    Hand Dominance Right                         [facility] Adult PT Treatment/Exercise - 01/27/20 0725      Transfers   Transfers --      Exercises   Exercises Neck;Shoulder      Neck Exercises: Seated   Cervical Rotation Right;Left;5 reps    Lateral Flexion Right;Left;5 reps      Neck Exercises: Supine  Cervical Isometrics Extension;Right lateral flexion;Left lateral flexion;Right rotation;Left rotation;5 secs    Cervical Isometrics Limitations working through pain free ROM with isometrics at tolerable end ranges    Neck Retraction 5 reps    Cervical Rotation Right;Left;5 reps      Neck Exercises: Sidelying   Lateral Flexion Right;Left;10 reps    Lateral Flexion Limitations initially began iwth L lateral SB in R sidelying, then switched to isometric holds to reduce pain      Neck Exercises: Prone   Neck Retraction 5 reps    Neck Retraction Limitations adding neck rotation with retraction in prone      Shoulder Exercises: ROM/Strengthening   UBE (Upper Arm Bike) L2 x 2 minutes forward/ 2 minutes backward      Manual Therapy   Manual Therapy Soft tissue mobilization;Manual Traction;Joint mobilization    Manual therapy comments to improve ROM, skilled palpation for tissue assessment during dry needling    Joint Mobilization CPA mid c-spine and T2-T3    Soft tissue mobilization STM to cervical paraspinals    Passive ROM PROM with isometric holds    Manual Traction supine gentle manual traction with flexion for increased stretch             Trigger Point Dry Needling - 01/27/20 0854    Consent Given? Yes    Education Handout Provided Previously provided    Muscles Treated Head and Neck Upper trapezius;Levator scapulae    Upper Trapezius Response Twitch reponse elicited;Palpable increased muscle length    Levator Scapulae Response Twitch response elicited;Palpable increased muscle length                     PT Long Term Goals - 01/22/20 0944      PT LONG TERM GOAL #1   Title Patient demonstrates improve posture and alignment with activation of posterior shoulder girdle and improved position of head and neck for functional activities    Time 6    Period Weeks    Status Partially Met      PT LONG TERM GOAL #2   Title Increase cervical ROM to [facility] and pain free in extension, lateral flexion, rotation    Time 6    Period Weeks    Status Partially Met      PT LONG TERM GOAL #3   Title Patient reports ability to turn head and tilt head to side functionally with no pain    Time 6    Period Weeks    Status Partially Met      PT LONG TERM GOAL #4   Title Independent in HEP    Time 6    Period Weeks    Status Partially Met      PT LONG TERM GOAL #5   Title Improve FOTO to </= 28% limitation    Time 6    Period Weeks    Status On-going                 Plan - 01/27/20 0850    Clinical Impression Statement The patient has significant improvement in A/ROM, but continued pain with L lateral SB c-spine.  In supine, he can get to approximately 30 degrees for low c-spine "catching" sensation.  In sitting, he has approximately 20 degrees of pain free lateral SB.  PT to continue to progress.  He undergoes MRI this weekend.    Rehab Potential Good    PT Frequency 2x / week    PT  Duration 6 weeks    PT Treatment/Interventions ADLs/Self Care Home Management;Aquatic Therapy;Cryotherapy;Electrical Stimulation;Iontophoresis 29m/ml Dexamethasone;Moist Heat;Ultrasound;Therapeutic activities;Therapeutic  exercise;Neuromuscular re-education;Patient/family education;Manual techniques;Dry needling;Taping    PT Next Visit Plan cervical isometrics and muscle energy to reduce muscle guarding L paraspinal (cervical) musculature; cervical stretching; modalities as indicated; using TENS unit for home    PT Home Exercise Plan [id number]    Consulted and Agree with Plan of Care Patient           Patient will benefit from skilled therapeutic intervention in order to improve the following deficits and impairments:  Increased fascial restricitons, Increased muscle spasms, Pain, Hypomobility, Impaired flexibility, Improper body mechanics, Decreased mobility, Decreased strength, Postural dysfunction  Visit Diagnosis: Cervicalgia  Other symptoms and signs involving the musculoskeletal system  Abnormal posture  Stiffness of neck     Problem List Patient Active Problem List   Diagnosis Date Noted  . Radiculopathy 01/25/2020  . Hyperlipidemia associated with type 2 diabetes mellitus ([place] 12/22/2019  . Vitamin D deficiency 09/08/2019  . Low HDL (under 40) 09/08/2019  . Pancreatic insufficiency 03/28/2018  . Type 2 diabetes mellitus with complication, with long-term current use of insulin ([place] 09/16/2017  . DVT (deep venous thrombosis), right 04/28/2015  . Acute deep vein thrombosis (DVT) of femoral vein of right lower extremity ([place] 04/28/2015  . Neck injury 12/22/2013  . Hemorrhoid 03/23/2013  . Varicose vein of leg right 03/23/2013  . Visit for preventive health examination 03/23/2013  . Diastasis recti 12/22/2012  . Tendinitis of right shoulder 09/27/2011  . Right shoulder pain 09/27/2011  . Allergic rhinitis, cause unspecified 05/23/2011  . Sleep disturbance, unspecified 02/21/2011  . 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 Preventative health care 12/05/2010  . Chronic diarrhea of unknown origin pt says not from metformin 12/05/2010  . 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  . URINARY  URGENCY 01/21/2009  . Hyperlipidemia 01/23/2008  . Obstructive sleep apnea 12/22/2007  . VARICOSE VEINS, LOWER EXTREMITIES 10/03/2007  . ASTHMA 09/19/2007  . ASTHMA UNSPECIFIED WITH EXACERBATION 09/19/2007  . PLANTAR FASCIITIS, LEFT 06/23/2007  . RASH AND OTHER NONSPECIFIC SKIN ERUPTION 05/26/2007  . EDEMA 05/26/2007  . Class 2 severe obesity with serious comorbidity and body mass index (BMI) of 38.0 to 38.9 in adult ([place] 02/13/2007  . DVT, HX OF 01/15/2007  . 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    Capri Veals, PT 01/27/2020, 8:54 AM  [facility] [address]Phone: [phone]  Fax:  [phone] Name: MLANGFORD CARIASMRN: [id number] of Birth: 505-01-1971

## 2020-01-29 ENCOUNTER — Encounter: Payer: BC Managed Care – PPO | Admitting: Physical Therapy

## 2020-01-30 ENCOUNTER — Ambulatory Visit (INDEPENDENT_AMBULATORY_CARE_PROVIDER_SITE_OTHER): Payer: BC Managed Care – PPO

## 2020-01-30 ENCOUNTER — Other Ambulatory Visit: Payer: Self-pay

## 2020-01-30 DIAGNOSIS — M50322 Other cervical disc degeneration at C5-C6 level: Secondary | ICD-10-CM

## 2020-01-30 DIAGNOSIS — M5412 Radiculopathy, cervical region: Secondary | ICD-10-CM

## 2020-01-30 DIAGNOSIS — M4802 Spinal stenosis, cervical region: Secondary | ICD-10-CM | POA: Diagnosis not present

## 2020-01-30 DIAGNOSIS — M542 Cervicalgia: Secondary | ICD-10-CM | POA: Diagnosis not present

## 2020-01-30 DIAGNOSIS — M62838 Other muscle spasm: Secondary | ICD-10-CM

## 2020-01-30 DIAGNOSIS — M5031 Other cervical disc degeneration,  high cervical region: Secondary | ICD-10-CM | POA: Diagnosis not present

## 2020-01-31 ENCOUNTER — Encounter: Payer: Self-pay | Admitting: Family Medicine

## 2020-01-31 DIAGNOSIS — M5412 Radiculopathy, cervical region: Secondary | ICD-10-CM

## 2020-02-01 ENCOUNTER — Other Ambulatory Visit: Payer: Self-pay | Admitting: Internal Medicine

## 2020-02-01 NOTE — Progress Notes (Signed)
MRI cspine shows solid fusion where you had surgery. There are other areas in the neck that have problems but the most relevant for your right arm pain is the Right C6 foraminal stenosis. Next step is injection. I will order the epidural steroid injection now for imaging.  Please call them at [phone] to schedule.  Additionally there are other findings that probably should have some attention paid to them.  If you would like to have a detailed discussion please schedule follow-up appointment with clinic with me.

## 2020-02-02 ENCOUNTER — Telehealth: Payer: Self-pay

## 2020-02-02 NOTE — Telephone Encounter (Signed)
[place] with [facility] calling for clearance for patient to put Eliquis on hold 48 hours prior to Cervical Epidural. Per [place] this could be a written note in epic or you could write something to be faxed. Please advise?

## 2020-02-02 NOTE — Telephone Encounter (Signed)
OK for patient  To hold eliquis for 48 hours before  Cervical  Epidural injection  procedure .  Please send them   Formal notification

## 2020-02-08 ENCOUNTER — Encounter (INDEPENDENT_AMBULATORY_CARE_PROVIDER_SITE_OTHER): Payer: Self-pay | Admitting: Family Medicine

## 2020-02-08 ENCOUNTER — Ambulatory Visit (INDEPENDENT_AMBULATORY_CARE_PROVIDER_SITE_OTHER): Payer: BC Managed Care – PPO | Admitting: Internal Medicine

## 2020-02-08 ENCOUNTER — Encounter: Payer: Self-pay | Admitting: Internal Medicine

## 2020-02-08 ENCOUNTER — Ambulatory Visit (INDEPENDENT_AMBULATORY_CARE_PROVIDER_SITE_OTHER): Payer: BC Managed Care – PPO | Admitting: Family Medicine

## 2020-02-08 ENCOUNTER — Other Ambulatory Visit: Payer: Self-pay

## 2020-02-08 VITALS — BP 108/64 | HR 78 | Temp 98.1°F | Ht 71.0 in | Wt 280.0 lb

## 2020-02-08 VITALS — BP 100/65 | HR 82 | Temp 97.6°F | Ht 71.0 in | Wt 275.0 lb

## 2020-02-08 DIAGNOSIS — M489 Spondylopathy, unspecified: Secondary | ICD-10-CM

## 2020-02-08 DIAGNOSIS — E785 Hyperlipidemia, unspecified: Secondary | ICD-10-CM | POA: Diagnosis not present

## 2020-02-08 DIAGNOSIS — Z794 Long term (current) use of insulin: Secondary | ICD-10-CM

## 2020-02-08 DIAGNOSIS — Z6838 Body mass index (BMI) 38.0-38.9, adult: Secondary | ICD-10-CM

## 2020-02-08 DIAGNOSIS — Z0001 Encounter for general adult medical examination with abnormal findings: Secondary | ICD-10-CM | POA: Diagnosis not present

## 2020-02-08 DIAGNOSIS — E1169 Type 2 diabetes mellitus with other specified complication: Secondary | ICD-10-CM | POA: Diagnosis not present

## 2020-02-08 DIAGNOSIS — I82501 Chronic embolism and thrombosis of unspecified deep veins of right lower extremity: Secondary | ICD-10-CM

## 2020-02-08 DIAGNOSIS — E118 Type 2 diabetes mellitus with unspecified complications: Secondary | ICD-10-CM

## 2020-02-08 DIAGNOSIS — Z6839 Body mass index (BMI) 39.0-39.9, adult: Secondary | ICD-10-CM | POA: Diagnosis not present

## 2020-02-08 DIAGNOSIS — E11319 Type 2 diabetes mellitus with unspecified diabetic retinopathy without macular edema: Secondary | ICD-10-CM

## 2020-02-08 DIAGNOSIS — E7849 Other hyperlipidemia: Secondary | ICD-10-CM

## 2020-02-08 DIAGNOSIS — Z79899 Other long term (current) drug therapy: Secondary | ICD-10-CM | POA: Diagnosis not present

## 2020-02-08 DIAGNOSIS — R209 Unspecified disturbances of skin sensation: Secondary | ICD-10-CM | POA: Diagnosis not present

## 2020-02-08 MED ORDER — ATORVASTATIN CALCIUM 40 MG PO TABS
ORAL_TABLET | ORAL | 1 refills | Status: DC
Start: 2020-02-08 — End: 2020-09-29

## 2020-02-08 NOTE — Progress Notes (Signed)
Chief Complaint  Patient presents with  . Annual Exam  . Medication Management    HPI: Patient  Troy [name]  49 y.o. comes in today for Preventive Health Care visit  And chronic disease management follow-up. Diabetes managed by endocrinology years A1c is excellent since he has had healthy weight loss under the weight management program. In regard to his chronic right lower extremity DVT he does a wrap which has been quite helpful in the weight loss and activity.  He continues on chronic anticoagulation.  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 In regard to his neck he has had multilevel disc and nerve radicular symptoms.  No new weakness.  He has residual weakness in his right upper extremity.  He is right-handed.  He has accommodation at work.  Believe he is up-to-date with his eye check. Obstructive sleep apnea continues on CPAP. Still complains as he has for quite a while of hands and feet feeling cold but no acute findings no active bleeding. No known claudication. Had stop atorvastatin recently put back on it for at least the last 6 weeks.  Last lipid panel was when he was off atorvastatin. Currently his blood pressure tends to run on the low side in the 100s but without exercise intolerance or syncope although if he stands up quickly may notice that. Using Ventolin only as needed with wheezing when he works in the yard exposed to leaves and other outside environmental allergens. He is not using his baclofen at this time He is on Lyrica twice a day but often only uses it once a day as needed Still has chronic loose stools. He is on high-dose vitamin D as per weight management to go down to OTC.   Health Maintenance  Topic Date Due  . Hepatitis C Screening  Never done  . OPHTHALMOLOGY EXAM  12/19/2015  . FOOT EXAM  10/25/2016  . URINE MICROALBUMIN  12/26/2019  . HEMOGLOBIN A1C  05/29/2020  . COLONOSCOPY   04/15/2023  . TETANUS/TDAP  07/23/2027  . INFLUENZA VACCINE  Completed  . PNEUMOCOCCAL POLYSACCHARIDE VACCINE AGE 22-64 HIGH RISK  Completed  . COVID-19 Vaccine  Completed  . 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  Has lost a significant amount of weight in the last number of months wife has lost 85 pounds in a year.  They are continuing to do dietary changes together.   ROS:  GEN/ HEENT: No fever, significant weight changes sweats headaches vision problems hearing changes, CV/ PULM; No chest pain shortness of breath cough, syncope,edema  change in exercise tolerance. GI /GU: No adominal pain, vomiting, change in bowel habits. No blood in the stool. No significant GU symptoms. SKIN/HEME: ,no acute skin rashes suspicious lesions or bleeding. No lymphadenopathy, nodules, masses.  NEURO/ PSYCH:  No neurologic signs such as weakness numbness. No depression anxiety. IMM/ Allergy: No unusual infections.  Allergy .   REST of 12 system review negative except as per HPI   Past Medical History:  Diagnosis Date  . Arm weakness    right   . Asthma   . B12 deficiency   . Back pain   .  Diabetes insipidus ([place])   . Diabetes mellitus without complication ([place])   . DVT (deep venous thrombosis) ([place])   . Leg edema, right   . Male infertility   . Neck injury 12/22/2013  . Obesity   . Obesity   . OSA (obstructive sleep apnea)     Past Surgical History:  Procedure Laterality Date  . BACK SURGERY    . CERVICAL DISC SURGERY     C6/C7 2009  . cubital tunnel left arm     2003  . ELBOW SURGERY    . FIBULA FRACTURE SURGERY     plate & pin removed due to infection 1997  . ULNAR NERVE REPAIR      Family History  Problem Relation Age of Onset  . Heart disease Mother   . Other Mother         clotting disorders  . Diabetes Mother   . High blood pressure Mother   . Kidney disease Mother   . Depression Mother   . Sleep apnea Mother   . Obesity Mother   . Other Father        sudden death/cervical and lumbar disk disease  . Aneurysm Father        In his 25s smoked  . Hyperlipidemia Father   . High blood pressure Father   . Sudden death Father   . Alcoholism Father   . Hypertension Other   . Allergies Other   . Deep vein thrombosis Other   . Sleep apnea Other   . Obesity Other     Social History   Socioeconomic History  . Marital status: Married    Spouse name: Juliann Pulse  . Number of children: 1  . Years of education: Not on file  . Highest education level: Not on file  Occupational History  . Occupation: Therapist, art [employer]  Tobacco Use  . Smoking status: Never Smoker  . Smokeless tobacco: Never Used  Vaping Use  . Vaping Use: Never used  Substance and Sexual Activity  . Alcohol use: Yes    Comment: 1 drink mo.  . Drug use: No  . Sexual activity: Yes  Other Topics Concern  . Not on file  Social History Narrative   Occupation:  days [employer] 11- 8 am  Now changed job and shift to [employer] through Thursday    Working  Ft 40 hours mostly   Married '10 10 10   ' Wife s/p bariatric surgery pt   Regular exercise- yes   Pt does have children   Father died suddenly 05/06/08   Mom passes 2017  Acute rep disease after acute renal failure   Daily caffeine use one a day   2 dogs and cat.             Social Determinants of Health   Financial Resource Strain:   . Difficulty of Paying Living Expenses: Not on file  Food Insecurity:   . Worried About Charity fundraiser in the Last Year: Not on file  . Ran Out of Food in the Last Year: Not on file  Transportation Needs:   . Lack of Transportation (Medical): Not on file  . Lack of Transportation (Non-Medical): Not on file  Physical Activity:   . Days of Exercise per Week: Not on file  .  Minutes of Exercise per Session: Not on file  Stress:   . Feeling of Stress : Not on file  Social Connections:   .  Frequency of Communication with Friends and Family: Not on file  . Frequency of Social Gatherings with Friends and Family: Not on file  . Attends Religious Services: Not on file  . Active Member of Clubs or Organizations: Not on file  . Attends Archivist Meetings: Not on file  . Marital Status: Not on file    Outpatient Medications Prior to Visit  Medication Sig Dispense Refill  . albuterol (PROVENTIL HFA;VENTOLIN HFA) 108 (90 Base) MCG/ACT inhaler Inhale 2 puffs into the lungs every 6 (six) hours as needed. For wheezing 1 Inhaler 2  . apixaban (ELIQUIS) 5 MG TABS tablet TAKE 1 TABLET(5 MG) BY MOUTH TWICE DAILY 180 tablet 1  . baclofen (LIORESAL) 10 MG tablet TAKE 1 TABLET(10 MG) BY MOUTH THREE TIMES DAILY AS NEEDED 40 tablet 0  . Continuous Blood Gluc Sensor (FREESTYLE LIBRE 14 DAY SENSOR) MISC 1 EACH BY DOES NOT APPLY ROUTE EVERY 14 (FOURTEEN) DAYS. CHANGE EVERY 2 WEEKS 6 each 11  . HUMALOG 100 UNIT/ML injection INJECT UP TO 80 UNITS DAILY IN VGO. PLEASE MAKE APPOINTMENT 90 mL 3  . Insulin Disposable Pump (V-GO 20) KIT Use 1x a day 90 kit 3  . INVOKANA 100 MG TABS tablet TAKE 1 TABLET DAILY BEFORE BREAKFAST 90 tablet 3  . metFORMIN (GLUCOPHAGE-XR) 500 MG 24 hr tablet TAKE 4 TABLETS DAILY WITH BREAKFAST 360 tablet 3  . pregabalin (LYRICA) 75 MG capsule Take 1 capsule (75 mg total) by mouth 2 (two) times daily as needed. Nerve pain 180 capsule 3  . Semaglutide, 1 MG/DOSE, (OZEMPIC, 1 MG/DOSE,) 2 MG/1.5ML SOPN INJECT 1 MG UNDER THE SKIN ONCE A WEEK 9 mL 2  . Vitamin D, Ergocalciferol, (DRISDOL) 1.25 MG (50000 UNIT) CAPS capsule Take 1 capsule (50,000 Units total) by mouth every 7 (seven) days. 12 capsule 0  . atorvastatin (LIPITOR) 40 MG tablet TAKE 1 TABLET(40 MG) BY MOUTH DAILY 90 tablet 0   No facility-administered medications prior to visit.     EXAM:  BP  108/64   Pulse 78   Temp 98.1 F (36.7 C) (Oral)   Ht '5\' 11"'  (1.803 m)   Wt 280 lb (127 kg)   SpO2 98%   BMI 39.05 kg/m   Body mass index is 39.05 kg/m. Wt Readings from Last 3 Encounters:  02/08/20 280 lb (127 kg)  02/08/20 275 lb (124.7 kg)  01/25/20 278 lb (126.1 kg)    Physical Exam: Vital signs reviewed FTD:DUKG is a well-developed well-nourished alert cooperative    who appearsr stated age in no acute distress.  HEENT: normocephalic atraumatic , Eyes: PERRL EOM's full, conjunctiva clear, Nares: paten,t no deformity discharge or tenderness., Ears: no deformity EAC's wax in right ear TMs with normal landmarks. Mouth: masked NECK: Mildly stiff without masses, thyromegaly or bruits. CHEST/PULM:  Clear to auscultation and percussion breath sounds equal no wheeze , rales or rhonchi. No chest wall deformities or tenderness. CV: PMI is nondisplaced, S1 S2 no gallops, murmurs, rubs. Peripheral pulses  without delay.No JVD .  ABDOMEN: Bowel sounds normal nontender  No guard or rebound, no hepato splenomegal no CVA tenderness.   Extremtities:  No clubbing cyanosis o\ RLE significant improvement in edema some chronic skin changes that are old but no peeling or shininess.   Rue subtle atrophy stable  NEURO:  Oriented x3, cranial nerves 3-12 appear to be intact,gait within normal limitsSKIN: No acute rashes normal turgor, color, no bruising or petechiae.  Thickened toenail great no acute  rashes.  Feet show no ulcers or other changes PSYCH: Oriented, good eye contact, no obvious depression anxiety, cognition and judgment appear normal.  PHQ when GAD-7 1 and 2 LN: no cervical axillary inguinal adenopathy  Lab Results  Component Value Date   WBC 8.7 02/02/2019   HGB 16.0 02/02/2019   HCT 46.8 02/02/2019   PLT 233.0 02/02/2019   GLUCOSE 92 12/21/2019   CHOL 188 12/21/2019   TRIG 92 12/21/2019   HDL 35 (L) 12/21/2019   LDLDIRECT 125.0 12/26/2018   LDLCALC 136 (H) 12/21/2019   ALT 18  12/21/2019   AST 11 12/21/2019   NA 142 12/21/2019   K 4.8 12/21/2019   CL 103 12/21/2019   CREATININE 0.74 (L) 12/21/2019   BUN 13 12/21/2019   CO2 26 12/21/2019   TSH 1.32 04/27/2019   HGBA1C 5.9 (A) 11/30/2019   MICROALBUR 0.8 12/26/2018    BP Readings from Last 3 Encounters:  02/08/20 108/64  02/08/20 100/65  01/25/20 110/68    Lab plan reviewed with patient   ASSESSMENT AND PLAN:  Discussed the following assessment and plan:    ICD-10-CM   1. Encounter for preventative adult health care exam with abnormal findings  Z00.01 CBC with Differential/Platelet    Lipid panel    Microalbumin / creatinine urine ratio    Iron, TIBC and Ferritin Panel    Sedimentation rate    Sedimentation rate    Iron, TIBC and Ferritin Panel    Lipid panel    CBC with Differential/Platelet    Microalbumin / creatinine urine ratio  2. Other hyperlipidemia  E78.49 CBC with Differential/Platelet    Lipid panel    Microalbumin / creatinine urine ratio    Iron, TIBC and Ferritin Panel    Sedimentation rate    Sedimentation rate    Iron, TIBC and Ferritin Panel    Lipid panel    CBC with Differential/Platelet    Microalbumin / creatinine urine ratio   Back on a tour of for the past 6+ weeks  3. Chronic deep vein thrombosis (DVT) of right lower extremity, unspecified vein ([name])  I82.501 CBC with Differential/Platelet    Lipid panel    Microalbumin / creatinine urine ratio    Iron, TIBC and Ferritin Panel    Sedimentation rate    Sedimentation rate    Iron, TIBC and Ferritin Panel    Lipid panel    CBC with Differential/Platelet    Microalbumin / creatinine urine ratio   Leg looks much improved on anticoagulation and lymphedema bracing per patient  4. Medication management  Z79.899 CBC with Differential/Platelet    Lipid panel    Microalbumin / creatinine urine ratio    Iron, TIBC and Ferritin Panel    Sedimentation rate    Sedimentation rate    Iron, TIBC and Ferritin Panel     Lipid panel    CBC with Differential/Platelet    Microalbumin / creatinine urine ratio  5. Cold hand without peripheral vascular disease  R20.9 CBC with Differential/Platelet    Lipid panel    Microalbumin / creatinine urine ratio    Iron, TIBC and Ferritin Panel    Sedimentation rate    Sedimentation rate    Iron, TIBC and Ferritin Panel    Lipid panel    CBC with Differential/Platelet    Microalbumin / creatinine urine ratio   Uncertain cause neurologic versus other check iron level  6. Hyperlipidemia associated with type 2 diabetes mellitus ([place])  E11.69 CBC with Differential/Platelet   E78.5 Lipid panel    Microalbumin / creatinine urine ratio    Iron, TIBC and Ferritin Panel    Sedimentation rate    Sedimentation rate    Iron, TIBC and Ferritin Panel    Lipid panel    CBC with Differential/Platelet    Microalbumin / creatinine urine ratio  7. Type 2 diabetes mellitus with complication, with long-term current use of insulin ([name])  E11.8 CBC with Differential/Platelet   Z79.4 Lipid panel    Microalbumin / creatinine urine ratio    Iron, TIBC and Ferritin Panel    Sedimentation rate    Sedimentation rate    Iron, TIBC and Ferritin Panel    Lipid panel    CBC with Differential/Platelet    Microalbumin / creatinine urine ratio  8. Class 2 severe obesity with serious comorbidity and body mass index (BMI) of 38.0 to 38.9 in adult, unspecified obesity type ([place])  E66.01    Z68.38   9. 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 His cervical spine issues seem to be the most cause of problem at this current time.  Continue work Physicist, medical care. If blood pressure gets too low and presyncopal contact us but I do not think he has acute cardiac problem or myopathy  Has had  Flu vaccine via work  the [employer] can lower blood pressure from baseline. Return for depending on results yearly or other .  Patient  Care Team: Eldonna Neuenfeldt, [name], MD as PCP - General (Internal Medicine) Philemon Kingdom, MD as Consulting Physician (Internal Medicine) Patient Instructions  Glad  Your leg is doing better.  Continue healthy weight loss. Checking iron levels for  The cold extremity issue  Your pulses  Seem normal at this time   Will notify you  of labs when available.    Health Maintenance, Male Adopting a healthy lifestyle and getting preventive care are important in promoting health and wellness. Ask your health care provider about:  The right schedule for you to have regular tests and exams.  Things you can do on your own to prevent diseases and keep yourself healthy. What should I know about diet, weight, and exercise? Eat a healthy diet   Eat a diet that includes plenty of vegetables, fruits, low-fat dairy products, and lean protein.  Do not eat a lot of foods that are high in solid fats, added sugars, or sodium. Maintain a healthy weight Body mass index (BMI) is a measurement that can be used to identify possible weight problems. It estimates body fat based on height and weight. Your health care provider can help determine your BMI and help you achieve or maintain a healthy weight. Get regular exercise Get regular exercise. This is one of the most important things you can do for your health. Most adults should:  Exercise for at least 150 minutes each week. The exercise should increase your heart rate and make you sweat (moderate-intensity exercise).  Do strengthening exercises at least twice a week. This is in addition to the moderate-intensity exercise.  Spend less time sitting. Even light physical activity can be beneficial. Watch cholesterol and blood lipids Have your blood tested for lipids and cholesterol at 49 years of age, then have this test every 5 years. You may need to have your cholesterol levels checked more often if:  Your lipid or cholesterol levels are high.  You are  older  than 49 years of age.  You are at high risk for heart disease. What should I know about cancer screening? Many types of cancers can be detected early and may often be prevented. Depending on your health history and family history, you may need to have cancer screening at various ages. This may include screening for:  Colorectal cancer.  Prostate cancer.  Skin cancer.  Lung cancer. What should I know about heart disease, diabetes, and high blood pressure? Blood pressure and heart disease  High blood pressure causes heart disease and increases the risk of stroke. This is more likely to develop in people who have high blood pressure readings, are of African descent, or are overweight.  Talk with your health care provider about your target blood pressure readings.  Have your blood pressure checked: ? Every 3-5 years if you are 43-7 years of age. ? Every year if you are 55 years old or older.  If you are between the ages of 69 and 27 and are a current or former smoker, ask your health care provider if you should have a one-time screening for abdominal aortic aneurysm (AAA). Diabetes Have regular diabetes screenings. This checks your fasting blood sugar level. Have the screening done:  Once every three years after age 40 if you are at a normal weight and have a low risk for diabetes.  More often and at a younger age if you are overweight or have a high risk for diabetes. What should I know about preventing infection? Hepatitis B If you have a higher risk for hepatitis B, you should be screened for this virus. Talk with your health care provider to find out if you are at risk for hepatitis B infection. Hepatitis C Blood testing is recommended for:  Everyone born from [birth year] through 1965.  Anyone with known risk factors for hepatitis C. Sexually transmitted infections (STIs)  You should be screened each year for STIs, including gonorrhea and chlamydia, if: ? You are sexually active  and are younger than 49 years of age. ? You are older than 49 years of age and your health care provider tells you that you are at risk for this type of infection. ? Your sexual activity has changed since you were last screened, and you are at increased risk for chlamydia or gonorrhea. Ask your health care provider if you are at risk.  Ask your health care provider about whether you are at high risk for HIV. Your health care provider may recommend a prescription medicine to help prevent HIV infection. If you choose to take medicine to prevent HIV, you should first get tested for HIV. You should then be tested every 3 months for as long as you are taking the medicine. Follow these instructions at home: Lifestyle  Do not use any products that contain nicotine or tobacco, such as cigarettes, e-cigarettes, and chewing tobacco. If you need help quitting, ask your health care provider.  Do not use street drugs.  Do not share needles.  Ask your health care provider for help if you need support or information about quitting drugs. Alcohol use  Do not drink alcohol if your health care provider tells you not to drink.  If you drink alcohol: ? Limit how much you have to 0-2 drinks a day. ? Be aware of how much alcohol is in your drink. In the [place] one drink equals one 12 oz bottle of beer (355 mL), one 5 oz glass of wine (148 mL),  or one 1 oz glass of hard liquor (44 mL). General instructions  Schedule regular health, dental, and eye exams.  Stay current with your vaccines.  Tell your health care provider if: ? You often feel depressed. ? You have ever been abused or do not feel safe at home. Summary  Adopting a healthy lifestyle and getting preventive care are important in promoting health and wellness.  Follow your health care provider's instructions about healthy diet, exercising, and getting tested or screened for diseases.  Follow your health care provider's instructions on  monitoring your cholesterol and blood pressure. This information is not intended to replace advice given to you by your health care provider. Make sure you discuss any questions you have with your health care provider. Document Revised: 02/19/2018 Document Reviewed: 02/19/2018 Elsevier Patient Education  2020 [place] Chantil Bari M.D.

## 2020-02-08 NOTE — Progress Notes (Signed)
Chief Complaint:   Troy [name] is here to discuss his progress with his Troy treatment plan along with follow-up of his Troy related diagnoses. Troy [name] is on the Category 3 Plan and states he is following his eating plan approximately 80% of the time. Troy [name] states he is walking 15 minutes 3 times per week.  Today's visit was #: 11 Starting weight: 293 lbs Starting date: 09/07/2019 Today's weight: 275 lbs Today's date: 02/08/2020 Total lbs lost to date: 18 Total lbs lost since last in-office visit: 3  Interim History: Troy [name] did well over the holidays with his eating. He did not keep leftovers after Thanksgiving. His wife is a patient here also. He reports doing well on the Category 3 meal plan and reports staying under 300 calories for snacks. He does state he gets in adequate protein.  Subjective:   Type 2 diabetes mellitus with complication, with long-term current use of insulin (Troy [name]). Diabetes is well controlled. DM managed by Dr. Cruzita Lederer, whom he sees about every 4 months. Troy [name] is on insulin pump and CGM and reports being compliant with all medications (Humalog, Ozempic, Invokana, and metformin). He denies hypoglycemia.    Lab Results  Component Value Date   HGBA1C 5.9 (A) 11/30/2019   HGBA1C 6.4 (A) 07/27/2019   HGBA1C 7.5 (A) 04/27/2019   Lab Results  Component Value Date   MICROALBUR 0.8 12/26/2018   LDLCALC 136 (H) 12/21/2019   CREATININE 0.74 (L) 12/21/2019   No results found for: INSULIN  Assessment/Plan:   Type 2 diabetes mellitus with complication, with long-term current use of insulin (Troy [name]).  Adit will follow-up with Dr. Cruzita Lederer as directed and continue all medications.   Class 2 severe Troy with serious comorbidity and body mass index (BMI) of 38.0 to 38.9 in adult, unspecified Troy type (Troy [name]).  Troy [name] is currently in the action stage of change. As such, his goal is to continue with weight loss efforts. He has agreed to the  Category 3 Plan and will journal 250-350 calories and 25 grams of protein at breakfast.   Exercise goals: Troy [name] will continue walking 15 minutes 3 times per week.  Behavioral modification strategies: decreasing simple carbohydrates and meal planning and cooking strategies.  Troy [name] has agreed to follow-up with our clinic in 2-3 weeks.   Objective:   Blood pressure 100/65, pulse 82, temperature 97.6 F (36.4 C), height 5\' 11"  (1.803 m), weight 275 lb (124.7 kg), SpO2 98 %. Body mass index is 38.35 kg/m.  General: Cooperative, alert, well developed, in no acute distress. HEENT: Conjunctivae and lids unremarkable. Cardiovascular: Regular rhythm.  Lungs: Normal work of breathing. Neurologic: No focal deficits.   Lab Results  Component Value Date   CREATININE 0.74 (L) 12/21/2019   BUN 13 12/21/2019   NA 142 12/21/2019   K 4.8 12/21/2019   CL 103 12/21/2019   CO2 26 12/21/2019   Lab Results  Component Value Date   ALT 18 12/21/2019   AST 11 12/21/2019   ALKPHOS 71 12/21/2019   BILITOT 0.5 12/21/2019   Lab Results  Component Value Date   HGBA1C 5.9 (A) 11/30/2019   HGBA1C 6.4 (A) 07/27/2019   HGBA1C 7.5 (A) 04/27/2019   HGBA1C 6.7 (A) 12/26/2018   HGBA1C 7.3 (A) 09/16/2017   No results found for: INSULIN Lab Results  Component Value Date   TSH 1.32 04/27/2019   Lab Results  Component Value Date   CHOL 188 12/21/2019   HDL 35 (L) 12/21/2019  LDLCALC 136 (H) 12/21/2019   LDLDIRECT 125.0 12/26/2018   TRIG 92 12/21/2019   CHOLHDL 5.4 (H) 12/21/2019   Lab Results  Component Value Date   WBC 8.7 02/02/2019   HGB 16.0 02/02/2019   HCT 46.8 02/02/2019   MCV 94.8 02/02/2019   PLT 233.0 02/02/2019   Lab Results  Component Value Date   FERRITIN 141.3 07/22/2017   Attestation Statements:   Reviewed by clinician on day of visit: allergies, medications, problem list, medical history, surgical history, family history, social history, and previous encounter  notes.  IMichaelene Song, am acting as Location manager for [employer], FNP-C   I have reviewed the above documentation for accuracy and completeness, and I agree with the above. - Georgianne Fick, FNP

## 2020-02-08 NOTE — Patient Instructions (Signed)
Glad  Your leg is doing better.  Continue healthy weight loss. Checking iron levels for  The cold extremity issue  Your pulses  Seem normal at this time   Will notify you  of labs when available.    Health Maintenance, Male Adopting a healthy lifestyle and getting preventive care are important in promoting health and wellness. Ask your health care provider about:  The right schedule for you to have regular tests and exams.  Things you can do on your own to prevent diseases and keep yourself healthy. What should I know about diet, weight, and exercise? Eat a healthy diet   Eat a diet that includes plenty of vegetables, fruits, low-fat dairy products, and lean protein.  Do not eat a lot of foods that are high in solid fats, added sugars, or sodium. Maintain a healthy weight Body mass index (BMI) is a measurement that can be used to identify possible weight problems. It estimates body fat based on height and weight. Your health care provider can help determine your BMI and help you achieve or maintain a healthy weight. Get regular exercise Get regular exercise. This is one of the most important things you can do for your health. Most adults should:  Exercise for at least 150 minutes each week. The exercise should increase your heart rate and make you sweat (moderate-intensity exercise).  Do strengthening exercises at least twice a week. This is in addition to the moderate-intensity exercise.  Spend less time sitting. Even light physical activity can be beneficial. Watch cholesterol and blood lipids Have your blood tested for lipids and cholesterol at 49 years of age, then have this test every 5 years. You may need to have your cholesterol levels checked more often if:  Your lipid or cholesterol levels are high.  You are older than 49 years of age.  You are at high risk for heart disease. What should I know about cancer screening? Many types of cancers can be detected early and  may often be prevented. Depending on your health history and family history, you may need to have cancer screening at various ages. This may include screening for:  Colorectal cancer.  Prostate cancer.  Skin cancer.  Lung cancer. What should I know about heart disease, diabetes, and high blood pressure? Blood pressure and heart disease  High blood pressure causes heart disease and increases the risk of stroke. This is more likely to develop in people who have high blood pressure readings, are of African descent, or are overweight.  Talk with your health care provider about your target blood pressure readings.  Have your blood pressure checked: ? Every 3-5 years if you are 82-57 years of age. ? Every year if you are 3 years old or older.  If you are between the ages of 51 and 26 and are a current or former smoker, ask your health care provider if you should have a one-time screening for abdominal aortic aneurysm (AAA). Diabetes Have regular diabetes screenings. This checks your fasting blood sugar level. Have the screening done:  Once every three years after age 33 if you are at a normal weight and have a low risk for diabetes.  More often and at a younger age if you are overweight or have a high risk for diabetes. What should I know about preventing infection? Hepatitis B If you have a higher risk for hepatitis B, you should be screened for this virus. Talk with your health care provider to find out  if you are at risk for hepatitis B infection. Hepatitis C Blood testing is recommended for:  Everyone born from [birth year] through 1965.  Anyone with known risk factors for hepatitis C. Sexually transmitted infections (STIs)  You should be screened each year for STIs, including gonorrhea and chlamydia, if: ? You are sexually active and are younger than 49 years of age. ? You are older than 49 years of age and your health care provider tells you that you are at risk for this type of  infection. ? Your sexual activity has changed since you were last screened, and you are at increased risk for chlamydia or gonorrhea. Ask your health care provider if you are at risk.  Ask your health care provider about whether you are at high risk for HIV. Your health care provider may recommend a prescription medicine to help prevent HIV infection. If you choose to take medicine to prevent HIV, you should first get tested for HIV. You should then be tested every 3 months for as long as you are taking the medicine. Follow these instructions at home: Lifestyle  Do not use any products that contain nicotine or tobacco, such as cigarettes, e-cigarettes, and chewing tobacco. If you need help quitting, ask your health care provider.  Do not use street drugs.  Do not share needles.  Ask your health care provider for help if you need support or information about quitting drugs. Alcohol use  Do not drink alcohol if your health care provider tells you not to drink.  If you drink alcohol: ? Limit how much you have to 0-2 drinks a day. ? Be aware of how much alcohol is in your drink. In the [place] one drink equals one 12 oz bottle of beer (355 mL), one 5 oz glass of wine (148 mL), or one 1 oz glass of hard liquor (44 mL). General instructions  Schedule regular health, dental, and eye exams.  Stay current with your vaccines.  Tell your health care provider if: ? You often feel depressed. ? You have ever been abused or do not feel safe at home. Summary  Adopting a healthy lifestyle and getting preventive care are important in promoting health and wellness.  Follow your health care provider's instructions about healthy diet, exercising, and getting tested or screened for diseases.  Follow your health care provider's instructions on monitoring your cholesterol and blood pressure. This information is not intended to replace advice given to you by your health care provider. Make sure you  discuss any questions you have with your health care provider. Document Revised: 02/19/2018 Document Reviewed: 02/19/2018 Elsevier Patient Education  2020 [employer].

## 2020-02-09 ENCOUNTER — Encounter: Payer: BC Managed Care – PPO | Admitting: Physical Therapy

## 2020-02-09 LAB — LIPID PANEL
Cholesterol: 131 mg/dL (ref <200)
HDL: 37 mg/dL — ABNORMAL LOW (ref >=40)
LDL Cholesterol (Calc): 74 mg/dL (calc)
Non-HDL Cholesterol (Calc): 94 mg/dL (calc) (ref <130)
Total CHOL/HDL Ratio: 3.5 (calc) (ref <5.0)
Triglycerides: 117 mg/dL (ref <150)

## 2020-02-09 LAB — CBC WITH DIFFERENTIAL/PLATELET
Absolute Monocytes: 546 cells/uL (ref 200–950)
Basophils Absolute: 54 cells/uL (ref 0–200)
Basophils Relative: 0.5 %
Eosinophils Absolute: 11 cells/uL — ABNORMAL LOW (ref 15–500)
Eosinophils Relative: 0.1 %
HCT: 50.1 % — ABNORMAL HIGH (ref 38.5–50.0)
Hemoglobin: 17.3 g/dL — ABNORMAL HIGH (ref 13.2–17.1)
Lymphs Abs: 3135 cells/uL (ref 850–3900)
MCH: 32.6 pg (ref 27.0–33.0)
MCHC: 34.5 g/dL (ref 32.0–36.0)
MCV: 94.4 fL (ref 80.0–100.0)
MPV: 10.5 fL (ref 7.5–12.5)
Monocytes Relative: 5.1 %
Neutro Abs: 6955 cells/uL (ref 1500–7800)
Neutrophils Relative %: 65 %
Platelets: 234 10*3/uL (ref 140–400)
RBC: 5.31 10*6/uL (ref 4.20–5.80)
RDW: 12.1 % (ref 11.0–15.0)
Total Lymphocyte: 29.3 %
WBC: 10.7 10*3/uL (ref 3.8–10.8)

## 2020-02-09 LAB — MICROALBUMIN / CREATININE URINE RATIO
Creatinine, Urine: 88 mg/dL (ref 20–320)
Microalb Creat Ratio: 6 mcg/mg creat (ref <30)
Microalb, Ur: 0.5 mg/dL

## 2020-02-09 LAB — SEDIMENTATION RATE: Sed Rate: 11 mm/h (ref 0–15)

## 2020-02-09 LAB — IRON,TIBC AND FERRITIN PANEL
%SAT: 40 % (calc) (ref 20–48)
Ferritin: 96 ng/mL (ref 38–380)
Iron: 144 ug/dL (ref 50–180)
TIBC: 358 mcg/dL (calc) (ref 250–425)

## 2020-02-09 NOTE — Progress Notes (Signed)
Urine protein normal Cholesterol panel much better  even though only on med for 6 weeks Mildly Elevated  hg   could still be from [facility]   Not alarming but  should follow .   Plan  cbc and diff and erythropoetin level in  2-3 months .hydrated non fasting ok

## 2020-02-10 ENCOUNTER — Other Ambulatory Visit: Payer: Self-pay

## 2020-02-10 ENCOUNTER — Ambulatory Visit
Admission: RE | Admit: 2020-02-10 | Discharge: 2020-02-10 | Disposition: A | Discharge status: Home / Self Care | Payer: BC Managed Care – PPO | Source: Ambulatory Visit | Attending: Family Medicine | Admitting: Family Medicine

## 2020-02-10 ENCOUNTER — Other Ambulatory Visit: Payer: Self-pay | Admitting: Family Medicine

## 2020-02-10 DIAGNOSIS — M5412 Radiculopathy, cervical region: Secondary | ICD-10-CM

## 2020-02-10 DIAGNOSIS — M47812 Spondylosis without myelopathy or radiculopathy, cervical region: Secondary | ICD-10-CM | POA: Diagnosis not present

## 2020-02-10 MED ORDER — TRIAMCINOLONE ACETONIDE 40 MG/ML IJ SUSP (RADIOLOGY)
60.0000 mg | Freq: Once | INTRAMUSCULAR | Status: AC
  Administered 2020-02-10: 60 mg via EPIDURAL

## 2020-02-10 MED ORDER — IOPAMIDOL (ISOVUE-M 300) INJECTION 61%
1.0000 mL | Freq: Once | INTRAMUSCULAR | Status: AC | PRN
  Administered 2020-02-10: 1 mL via EPIDURAL

## 2020-02-10 NOTE — Discharge Instructions (Signed)

## 2020-02-11 ENCOUNTER — Other Ambulatory Visit: Payer: Self-pay

## 2020-02-11 DIAGNOSIS — Z7901 Long term (current) use of anticoagulants: Secondary | ICD-10-CM

## 2020-02-11 NOTE — Progress Notes (Signed)
I, [name], LAT, ATC acting as a scribe for [name], MD.  Troy [name] is a 49 y.o. male who presents to [facility] today for c-spine pain. Pt was last seen by Dr. [name] on 01/22/20 and was advised to recheck after epidural injection. Pt received a cervical epidural injection 02/10/20. Today, pt reports no real change in neck pain. Pt c/o experiencing dizziness and HA after the epidural injection, but states it resolved after a day..   UE numbness/tingling: no UE weakness: same  Dx imaging: 12/14/19 C-spine XR  Pertinent review of systems: No fevers or chills  Relevant historical information: History of prior nerve injury right arm.  History of DVT on Eliquis.  History of prior cervical fusion   Exam:  BP 118/76 (BP Location: Left Arm, Patient Position: Sitting, Cuff Size: Large)   Pulse 84   Ht 5\' 11"  (1.803 m)   Wt 281 lb 3.2 oz (127.6 kg)   SpO2 98%   BMI 39.22 kg/m  General: Well Developed, well nourished, and in no acute distress.   MSK: Normal cervical motion    Lab and Radiology Results DG Cervical Spine Complete  Result Date: 12/14/2019 CLINICAL DATA:  Neck pain for 2 days EXAM: CERVICAL SPINE - COMPLETE 4+ VIEW COMPARISON:  04/19/2008 FINDINGS: Mild progression multilevel degenerative disc disease, greatest at C5-C6 and C6-C7. Anteriorly directed osteophytes. Postsurgical changes of C7-T1 ACDF with interbody spacer. No evidence of hardware fracture. Limited evaluation of the disc space given overlapping soft tissues at the cervicothoracic junction. No evidence of acute fracture. Vertebral body heights are maintained. No substantial subluxation. Prevertebral soft tissues are within normal limits. Visualized lung apices are unremarkable. IMPRESSION: 1. No evidence of acute fracture or malalignment. 2. ACDF at C7-T1. Mild progression of degenerative disc disease at C5-C6 and C6-C7. Electronically Signed   By: Margaretha Sheffield MD    On: 12/14/2019 09:05   MR CERVICAL SPINE WO CONTRAST  Result Date: 01/31/2020 CLINICAL DATA:  49 year old with left lower neck pain. Muscle spasm, bilateral hand and finger numbness for 2 months. No known injury. Prior cervical spine surgery. EXAM: MRI CERVICAL SPINE WITHOUT CONTRAST TECHNIQUE: Multiplanar, multisequence MR imaging of the cervical spine was performed. No intravenous contrast was administered. COMPARISON:  Cervical spine MRI 04/30/2008. FINDINGS: Alignment: Chronic straightening and mild reversal of cervical lordosis, stable since 2010. Vertebrae: Chronic C7-T1 ACDF with mild associated hardware susceptibility artifact. There is mild to moderate degenerative appearing marrow edema in the left side facets of C2-C3 (series 4, image 13), new since 2010. Trace surrounding soft tissue inflammation. See additional details of that level below. No other marrow edema or acute osseous abnormality. Mild degenerative endplate changes throughout the cervical spine but otherwise visualized bone marrow signal is within normal limits. Cord: No definite cervical spinal cord signal abnormality despite multilevel spinal stenosis with some cord mass effect, detailed below. Posterior Fossa, vertebral arteries, paraspinal tissues: Cervicomedullary junction is within normal limits. Negative visible posterior fossa. Preserved major vascular flow voids in the neck, the left vertebral artery appears dominant as before. Negative visible neck soft tissues aside from those adjacent to the C2-C3 facet described earlier. Negative visible lung apices. Disc levels: C2-C3: Moderate facet hypertrophy is greater on the left and progressed since 2010. Superimposed left facet marrow edema. Mild disc bulge eccentric to the right. No spinal stenosis. Only mild left C3 foraminal stenosis. C3-C4: Chronic mildly lobulated right paracentral disc protrusion. Mild facet hypertrophy. Mild spinal  stenosis. No spinal cord mass effect. Mild to  moderate right C4 foraminal stenosis. This level is progressed. C4-C5: Chronic circumferential disc bulge and endplate spurring with a new or increased superimposed central disc herniation (series 5, image 15). Mild facet hypertrophy. Spinal stenosis with mild ventral cord mass effect. No foraminal stenosis. This level is progressed. C5-C6: Chronic circumferential disc bulge and endplate spurring eccentric to the right. Superimposed mild to moderate facet hypertrophy on the right. Mild spinal stenosis and chronic right hemi cord mass effect (series 5, image 19). Moderate to severe right C6 foraminal stenosis has increased. C6-C7: Chronic circumferential but mostly far lateral disc bulge and endplate spurring. No significant stenosis. C7-T1:  Prior ACDF with evidence of solid arthrodesis.  No stenosis. T1-T2: Mostly far lateral disc bulging and endplate spurring. Mild facet hypertrophy greater on the left. There is a small left paracentral component of disc on series 5, image 34. But no significant spinal or foraminal stenosis. T2-T3: Right paracentral disc bulge or protrusion is evident on series 2, image 5. No definite stenosis. IMPRESSION: 1. Prior C7-T1 ACDF with solid arthrodesis. 2. 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 Query left upper neck pain. 3. Fairly mild adjacent segment disease at both C6-C7 and T1-T2, without significant stenosis. 4. Chronic disc degeneration C3-C4 through C5-C6, progressed since 2010 at C4-C5 along with mild spinal stenosis and mild cord mass effect at that level. No associated spinal cord signal abnormality. Moderate to severe right C6 and moderate right C4 foraminal stenosis have progressed. Electronically Signed   By: [name] M.D.   On: 01/31/2020 09:09   DG INJECT DIAG/THERA/INC NEEDLE/CATH/PLC EPI/CERV/THOR W/IMG  Result Date: 02/10/2020 CLINICAL DATA:  Cervical spondylosis without myelopathy. Right C6 radiculopathy. Numbness in  the right thumb and index finger. Left neck pain and developing numbness in the left hand. Prior C7-T1 ACDF. FLUOROSCOPY TIME:  Fluoroscopy Time: 33 seconds Radiation Exposure Index: 53.22 microGray*m^2 PROCEDURE: The procedure, risks, benefits, and alternatives were explained to the patient. Questions regarding the procedure were encouraged and answered. The patient understands and consents to the procedure. CERVICAL EPIDURAL INJECTION An interlaminar approach was performed on the right at C7-T1. A 3.5 inch 20 gauge epidural needle was advanced using loss-of-resistance technique. DIAGNOSTIC EPIDURAL INJECTION Injection of Isovue-M 300 shows a good epidural pattern with spread above and below the level of needle placement, primarily on the right. No vascular opacification is seen. THERAPEUTIC EPIDURAL INJECTION 1.5 ml of Kenalog 40 mixed with 2 ml of normal saline were then instilled. The procedure was well-tolerated, and the patient was discharged thirty minutes following the injection in good condition. IMPRESSION: Technically successful interlaminar epidural injection on the right at C7-T1. Electronically Signed   By: Logan Bores M.D.   On: 02/10/2020 09:22     I, [name], personally (independently) visualized and performed the interpretation of the images attached in this note.     Assessment and Plan: 49 y.o. male with right arm pain thought to be C6 cervical radiculopathy.  This is the worst appearance nerve root compression on MRI corresponds with the pain that he is having.  His clinical exam is a bit mottled by prior peripheral nerve injury affecting the right side.  He already has had one epidural steroid injection and C7-T1 which occurred 2 days ago.  This has not helped yet but it still early.  Recommend giving it about a week.  If not improving will recommend repeating epidural steroid  injection.  If still not better would recommend consultation back with neurosurgery to discuss surgical  options.  I will not be in clinic from December 10-17.  I went ahead and ordered epidural steroid injection now and he can schedule it next week or the week after if needed.   PDMP not reviewed this encounter. Orders Placed This Encounter  Procedures  . DG INJECT DIAG/THERA/INC NEEDLE/CATH/PLC EPI/LUMB/SAC W/IMG    Standing Status:   Future    Standing Expiration Date:   02/11/2021    Order Specific Question:   Reason for Exam (SYMPTOM  OR DIAGNOSIS REQUIRED)    Answer:   right C6 radiolopathy. Level and technique per radiology. Injection #2    Order Specific Question:   Preferred Imaging Location?    Answer:   [id number]    Order Specific Question:   Radiology Contrast Protocol - do NOT remove file path    Answer:   [email or web address]   No orders of the defined types were placed in this encounter.    Discussed warning signs or symptoms. Please see discharge instructions. Patient expresses understanding.   The above documentation has been reviewed and is accurate and complete [name], M.D. Total encounter time 20 minutes including face-to-face time with the patient and, reviewing past medical record, and charting on the date of service.   MRI results and treatment plan

## 2020-02-12 ENCOUNTER — Encounter: Payer: Self-pay | Admitting: Rehabilitative and Restorative Service Providers"

## 2020-02-12 ENCOUNTER — Ambulatory Visit (INDEPENDENT_AMBULATORY_CARE_PROVIDER_SITE_OTHER): Payer: BC Managed Care – PPO | Admitting: Family Medicine

## 2020-02-12 ENCOUNTER — Other Ambulatory Visit: Payer: Self-pay

## 2020-02-12 ENCOUNTER — Ambulatory Visit (INDEPENDENT_AMBULATORY_CARE_PROVIDER_SITE_OTHER): Payer: BC Managed Care – PPO | Admitting: Rehabilitative and Restorative Service Providers"

## 2020-02-12 VITALS — BP 118/76 | HR 84 | Ht 71.0 in | Wt 281.2 lb

## 2020-02-12 DIAGNOSIS — M5412 Radiculopathy, cervical region: Secondary | ICD-10-CM

## 2020-02-12 DIAGNOSIS — M542 Cervicalgia: Secondary | ICD-10-CM | POA: Diagnosis not present

## 2020-02-12 DIAGNOSIS — R293 Abnormal posture: Secondary | ICD-10-CM | POA: Diagnosis not present

## 2020-02-12 DIAGNOSIS — R29898 Other symptoms and signs involving the musculoskeletal system: Secondary | ICD-10-CM | POA: Diagnosis not present

## 2020-02-12 NOTE — Patient Instructions (Addendum)
Thank you for coming in today.  Please call [facility] at [phone] to schedule your spine injection if you dont get better in a week or so.   Continue PT.   If you have the 2nd injection and do not improve next step is referral back to Neurosurgery to talk with Dr [name].   For blood thinner will need to stop eliquis 24 hours prior to the injection and for 72 hours following the injection (or whatever the radiologist says).

## 2020-02-12 NOTE — Patient Instructions (Signed)
Access Code: [email or web address] URL: [email or web address] Date: 02/12/2020 Prepared by: Rudell Cobb  Exercises Seated Cervical Retraction - 2 x daily - 7 x weekly - 1-2 sets - 5-10 reps - 10 sec hold Standing Isometric Cervical Sidebending with Manual Resistance - 2 x daily - 7 x weekly - 1 sets - 5 reps - 3 seconds hold Shoulder External Rotation in 45 Degrees Abduction - 2 x daily - 7 x weekly - 1-2 sets - 10 reps - 3 sec hold Standing Bilateral Low Shoulder Row with Anchored Resistance - 2 x daily - 7 x weekly - 1-3 sets - 10 reps - 2-3 sec hold Shoulder Extension with Resistance - 2 x daily - 7 x weekly - 1-3 sets - 10 reps - 2-3 sec hold Standing Shoulder Horizontal and Diagonal Pulls with Resistance - 2 x daily - 7 x weekly - 1 sets - 10 reps Standing with Forearms Thoracic Rotation - 2 x daily - 7 x weekly - 1 sets - 10 reps Standing Pec Stretch at Wall - 2 x daily - 7 x weekly - 1 sets - 10 reps Doorway Pec Stretch at 90 Degrees Abduction - 3 x daily - 7 x weekly - 3 reps - 1 sets - 30 seconds hold Doorway Pec Stretch at 120 Degrees Abduction - 3 x daily - 7 x weekly - 3 reps - 1 sets - 30 second hold hold

## 2020-02-12 NOTE — Therapy (Signed)
[place], [place], [address] Phone: [phone]   Fax:  [phone]  Physical Therapy Treatment and Renewal Summary  Patient Details  Name: Troy [name] MRN: [id number] Date of Birth: 1970-11-16 Referring Provider (PT): Dr [name]   Encounter Date: 02/12/2020   PT End of Session - 02/12/20 1202    Visit Number 8    Number of Visits 16    Date for PT Re-Evaluation 03/13/20    PT Start Time 4401    PT Stop Time 1235    PT Time Calculation (min) 39 min    Activity Tolerance Patient tolerated treatment well    Behavior During Therapy [facility] for tasks assessed/performed           Past Medical History:  Diagnosis Date  . Arm weakness    right   . Asthma   . B12 deficiency   . Back pain   . Diabetes insipidus ([place])   . Diabetes mellitus without complication ([place])   . DVT (deep venous thrombosis) ([place])   . Leg edema, right   . Male infertility   . Neck injury 12/22/2013  . Obesity   . Obesity   . OSA (obstructive sleep apnea)     Past Surgical History:  Procedure Laterality Date  . BACK SURGERY    . CERVICAL DISC SURGERY     C6/C7 2009  . cubital tunnel left arm     2003  . ELBOW SURGERY    . FIBULA FRACTURE SURGERY     plate & pin removed due to infection 1997  . ULNAR NERVE REPAIR      There were no vitals filed for this visit.   Subjective Assessment - 02/12/20 1157    Subjective The patient got an epidural on Wednesday with no change.  He is noticing increased shooting pain down R UE (to wrist) when he nods his head-- not every time, but occasionally.  He also continues iwth numbness first 2 digits R hand and L hand and L cervical pain.  The patient has h/o C7-T1 fusion    Pertinent History cerrvical disc fusion C7/T1 2008; fall with cervical whiplash ~ 5 yrs ago; fibular fx Rt LE with several surgeries; AODM; Nerve injury to Rt UE yrs ago with residual nerve damage: Has  lost 22 # in the past 3-4 months    Patient Stated Goals reduce pain c-spine    Currently in Pain? Yes    Pain Score 3     Pain Location Neck    Pain Orientation Left    Pain Descriptors / Indicators Aching    Pain Type Acute pain    Pain Radiating Towards numbness is constant    Pain Onset More than a month ago    Pain Frequency Constant    Aggravating Factors  numbness is now constant in hands; head nodding, lifting head from pillow= sharp pain    Pain Relieving Factors mild pain at rest              [facility] PT Assessment - 02/12/20 1205      Assessment   Medical Diagnosis Cervical dysfunction    Referring Provider (PT) Dr [name]    Onset Date/Surgical Date 10/25/19    Hand Dominance Right      AROM   Cervical - Right Side Bend 45   with stretch sensation L upper trap   Cervical - Left Side Bend 18  with L cervial pain   Cervical - Right Rotation 70    Cervical - Left Rotation 62                         [facility] Adult PT Treatment/Exercise - 02/12/20 1205      Exercises   Exercises Neck;Shoulder      Neck Exercises: Seated   Cervical Isometrics Right lateral flexion;Left lateral flexion;5 secs;5 reps    Neck Retraction 5 reps    Cervical Rotation Right;Left;5 reps    Lateral Flexion Right;Left;5 reps      Neck Exercises: Supine   Cervical Rotation Right;Left;5 reps    Cervical Rotation Limitations with P/ROM and stretching    Lateral Flexion Right;Left;5 reps    Lateral Flexion Limitations P/ROM and stretching      Manual Therapy   Manual Therapy Soft tissue mobilization;Myofascial release;Manual Traction    Manual therapy comments to improve ROM, decrease muscle guarding    Soft tissue mobilization STM to paraspinal c-spine Left/Right, upper trap, splenius capitus, levator; ischemic compression upper trap muscuature    Myofascial Release suboccipital release    Manual Traction supine gentle manual traction with flexion for increased stretch                    PT Education - 02/12/20 1235    Education Details HEP    Person(s) Educated Patient    Methods Explanation;Demonstration;Handout    Comprehension Verbalized understanding;Returned demonstration            Modified LTG target date   PT Long Term Goals - 02/12/20 1221      PT LONG TERM GOAL #1   Title Patient demonstrates improve posture and alignment with activation of posterior shoulder girdle and improved position of head and neck for functional activities    Time 6    Period Weeks    Status Revised    Target Date 03/13/20      PT LONG TERM GOAL #2   Title Increase cervical ROM to [facility] and pain free in extension, lateral flexion, rotation    Baseline ROM improved from 11 L SB to 18 deg L SB with pain, improved rotation to 70 deg R and 62 L    Time 6    Period Weeks    Status Partially Met      PT LONG TERM GOAL #3   Title Patient reports ability to turn head and tilt head to side functionally with no pain    Time 6    Period Weeks    Status Partially Met      PT LONG TERM GOAL #4   Title Independent in HEP    Time 6    Period Weeks    Status Partially Met      PT LONG TERM GOAL #5   Title Improve FOTO to </= 28% limitation    Time 6    Period Weeks    Status On-going                 Plan - 02/12/20 1613    Clinical Impression Statement The patient continues with improved A/ROM but continued pain.  He has worsening R UE pain (with shooting from neck into R arm), and constant numbness in R hand + intermittent numbness in the left hand. He has plan with MD to undergo another epidural injection if needed.    Rehab Potential Good    PT  Frequency 2x / week    PT Duration 4 weeks    PT Treatment/Interventions ADLs/Self Care Home Management;Aquatic Therapy;Cryotherapy;Electrical Stimulation;Iontophoresis 93m/ml Dexamethasone;Moist Heat;Ultrasound;Therapeutic activities;Therapeutic exercise;Neuromuscular re-education;Patient/family  education;Manual techniques;Dry needling;Taping    PT Next Visit Plan cervical isometrics and muscle energy to reduce muscle guarding L paraspinal (cervical) musculature; cervical stretching; modalities as indicated; using TENS unit for home    PT Home Exercise Plan [email or web address]    Consulted and Agree with Plan of Care Patient           Patient will benefit from skilled therapeutic intervention in order to improve the following deficits and impairments:  Increased fascial restricitons, Increased muscle spasms, Pain, Hypomobility, Impaired flexibility, Improper body mechanics, Decreased mobility, Decreased strength, Postural dysfunction  Visit Diagnosis: Cervicalgia  Other symptoms and signs involving the musculoskeletal system  Abnormal posture     Problem List Patient Active Problem List   Diagnosis Date Noted  . Radiculopathy 01/25/2020  . Hyperlipidemia associated with type 2 diabetes mellitus ([place] 12/22/2019  . Vitamin D deficiency 09/08/2019  . Low HDL (under 40) 09/08/2019  . Pancreatic insufficiency 03/28/2018  . Type 2 diabetes mellitus with retinopathy without macular edema, with long-term current use of insulin ([place] 09/16/2017  . DVT (deep venous thrombosis), right 04/28/2015  . Acute deep vein thrombosis (DVT) of femoral vein of right lower extremity ([place] 04/28/2015  . Hemorrhoid 03/23/2013  . Varicose vein of leg right 03/23/2013  . Visit for preventive health examination 03/23/2013  . Diastasis recti 12/22/2012  . Tendinitis of right shoulder 09/27/2011  . Right shoulder pain 09/27/2011  . Allergic rhinitis, cause unspecified 05/23/2011  . Sleep disturbance, unspecified 02/21/2011  . 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 Preventative health care 12/05/2010  . Chronic diarrhea of unknown origin pt says not from metformin 12/05/2010  . 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  . URINARY URGENCY 01/21/2009  . Hyperlipidemia 01/23/2008  . Obstructive sleep apnea  12/22/2007  . VARICOSE VEINS, LOWER EXTREMITIES 10/03/2007  . ASTHMA 09/19/2007  . ASTHMA UNSPECIFIED WITH EXACERBATION 09/19/2007  . PLANTAR FASCIITIS, LEFT 06/23/2007  . RASH AND OTHER NONSPECIFIC SKIN ERUPTION 05/26/2007  . EDEMA 05/26/2007  . Class 2 severe obesity with serious comorbidity and body mass index (BMI) of 39.0 to 39.9 in adult ([place] 02/13/2007  . DVT, HX OF 01/15/2007  . 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    WEAVER,CHRISTINA, PT 02/12/2020, 4:16 PM  [facility] [address]Phone: [phone]  Fax:  [phone] Name: MHAIDAN NHANMRN: [id number] of Birth: 523-Nov-1972

## 2020-02-14 ENCOUNTER — Other Ambulatory Visit: Payer: Self-pay | Admitting: Internal Medicine

## 2020-02-14 DIAGNOSIS — E11319 Type 2 diabetes mellitus with unspecified diabetic retinopathy without macular edema: Secondary | ICD-10-CM

## 2020-02-16 ENCOUNTER — Other Ambulatory Visit: Payer: Self-pay | Admitting: Internal Medicine

## 2020-02-16 ENCOUNTER — Ambulatory Visit (INDEPENDENT_AMBULATORY_CARE_PROVIDER_SITE_OTHER): Payer: BC Managed Care – PPO | Admitting: Rehabilitative and Restorative Service Providers"

## 2020-02-16 ENCOUNTER — Encounter: Payer: Self-pay | Admitting: Rehabilitative and Restorative Service Providers"

## 2020-02-16 ENCOUNTER — Other Ambulatory Visit: Payer: Self-pay

## 2020-02-16 DIAGNOSIS — R293 Abnormal posture: Secondary | ICD-10-CM

## 2020-02-16 DIAGNOSIS — R29898 Other symptoms and signs involving the musculoskeletal system: Secondary | ICD-10-CM | POA: Diagnosis not present

## 2020-02-16 DIAGNOSIS — M542 Cervicalgia: Secondary | ICD-10-CM | POA: Diagnosis not present

## 2020-02-16 NOTE — Patient Instructions (Signed)
Access Code: [email or web address] URL: [email or web address] Date: 02/16/2020 Prepared by: Rudell Cobb  Exercises Supine Chin Tuck - 2 x daily - 7 x weekly - 1 sets - 10 reps Standing Isometric Cervical Sidebending with Manual Resistance - 2 x daily - 7 x weekly - 1 sets - 5 reps - 3 seconds hold Shoulder External Rotation in 45 Degrees Abduction - 2 x daily - 7 x weekly - 1-2 sets - 10 reps - 3 sec hold Standing Bilateral Low Shoulder Row with Anchored Resistance - 2 x daily - 7 x weekly - 1-3 sets - 10 reps - 2-3 sec hold Shoulder Extension with Resistance - 2 x daily - 7 x weekly - 1-3 sets - 10 reps - 2-3 sec hold Standing Shoulder Horizontal and Diagonal Pulls with Resistance - 2 x daily - 7 x weekly - 1 sets - 10 reps Standing with Forearms Thoracic Rotation - 2 x daily - 7 x weekly - 1 sets - 10 reps Standing Pec Stretch at Wall - 2 x daily - 7 x weekly - 1 sets - 10 reps Doorway Pec Stretch at 90 Degrees Abduction - 3 x daily - 7 x weekly - 3 reps - 1 sets - 30 seconds hold Doorway Pec Stretch at 120 Degrees Abduction - 3 x daily - 7 x weekly - 3 reps - 1 sets - 30 second hold hold

## 2020-02-16 NOTE — Therapy (Addendum)
[place], [place], [address] Phone: [phone]   Fax:  [phone]  Physical Therapy Treatment and Discharge Summary  Patient Details  Name: Troy [name] MRN: [id number] Date of Birth: February 08, 1971 Referring Provider (PT): Dr [name]   Encounter Date: 02/16/2020   PT End of Session - 02/16/20 0856    Visit Number 9    Number of Visits 16    Date for PT Re-Evaluation 03/13/20    PT Start Time 0850    PT Stop Time 0930    PT Time Calculation (min) 40 min    Activity Tolerance Patient tolerated treatment well;Patient limited by pain    Behavior During Therapy [facility] for tasks assessed/performed           Past Medical History:  Diagnosis Date  . Arm weakness    right   . Asthma   . B12 deficiency   . Back pain   . Diabetes insipidus ([place])   . Diabetes mellitus without complication ([place])   . DVT (deep venous thrombosis) ([place])   . Leg edema, right   . Male infertility   . Neck injury 12/22/2013  . Obesity   . Obesity   . OSA (obstructive sleep apnea)     Past Surgical History:  Procedure Laterality Date  . BACK SURGERY    . CERVICAL DISC SURGERY     C6/C7 2009  . cubital tunnel left arm     2003  . ELBOW SURGERY    . FIBULA FRACTURE SURGERY     plate & pin removed due to infection 1997  . ULNAR NERVE REPAIR      There were no vitals filed for this visit.   Subjective Assessment - 02/16/20 0854    Subjective The patient reports increasing radicular symptoms with constant numbness R first 2 digits and L first 2 digits.  He also is continuing to note shooting pain with head nods.  He has called MD for further follow-up.    Pertinent History cerrvical disc fusion C7/T1 2008; fall with cervical whiplash ~ 5 yrs ago; fibular fx Rt LE with several surgeries; AODM; Nerve injury to Rt UE yrs ago with residual nerve damage: Has lost 22 # in the past 3-4 months    Patient Stated Goals  reduce pain c-spine    Currently in Pain? Yes    Pain Score 2     Pain Location Neck    Pain Orientation Left    Pain Descriptors / Indicators Aching    Pain Type Acute pain    Pain Onset More than a month ago    Pain Frequency Constant    Aggravating Factors  numbness is constant, head nodding increases sxs    Pain Relieving Factors rest              [facility] PT Assessment - 02/16/20 0857      Assessment   Medical Diagnosis Cervical dysfunction    Referring Provider (PT) Dr [name]    Onset Date/Surgical Date 10/25/19                         [facility] Adult PT Treatment/Exercise - 02/16/20 0933      Exercises   Exercises Neck;Shoulder      Neck Exercises: Seated   Cervical Isometrics Right lateral flexion;Left lateral flexion;5 secs    Cervical Isometrics Limitations reviewed from HEP    Neck  Retraction 5 reps    Neck Retraction Limitations cues for increased mobility      Neck Exercises: Supine   Neck Retraction 10 reps;5 secs      Shoulder Exercises: Standing   External Rotation Strengthening;Both;10 reps    Extension Strengthening;Both;10 reps;Theraband    Theraband Level (Shoulder Extension) Level 3 (Green)    Extension Limitations cues for posture    Row Strengthening;Both;10 reps;Theraband    Theraband Level (Shoulder Row) Level 3 (Green)    Diagonals Strengthening;Both;10 reps    Theraband Level (Shoulder Diagonals) Level 3 (Green)    Other Standing Exercises thoracic rotation with forearms on wall x 5 reps each way, holding 5 sec.       Shoulder Exercises: ROM/Strengthening   UBE (Upper Arm Bike) L2 1.5 minutes forward/1.5 minutes backward      Shoulder Exercises: Stretch   Corner Stretch Limitations door frame stetch 90 and 120 degrees x 3 reps x 20 second holds    Wall Stretch - ABduction 1 rep;30 seconds                       PT Long Term Goals - 02/12/20 1221      PT LONG TERM GOAL #1   Title Patient demonstrates  improve posture and alignment with activation of posterior shoulder girdle and improved position of head and neck for functional activities    Time 4    Period Weeks    Status Revised    Target Date 03/13/20      PT LONG TERM GOAL #2   Title Increase cervical ROM to [facility] and pain free in extension, lateral flexion, rotation    Baseline ROM improved from 11 L SB to 18 deg L SB with pain, improved rotation to 70 deg R and 62 L    Time 4    Period Weeks    Status Revised    Target Date 03/13/20      PT LONG TERM GOAL #3   Title Patient reports ability to turn head and tilt head to side functionally with no pain    Time 4    Period Weeks    Status Revised    Target Date 03/13/20      PT LONG TERM GOAL #4   Title Independent in HEP    Time 4    Period Weeks    Status Revised    Target Date 03/13/20      PT LONG TERM GOAL #5   Title Improve FOTO to </= 28% limitation    Time 53    Period Weeks    Status Revised    Target Date 03/13/20                 Plan - 02/16/20 0100    Clinical Impression Statement Patient's radicular symptoms are worsening in bilat UEs. Recommend f/u with MD for intervention.  PT will place on hold until further communication from patient/MD.    Rehab Potential Good    PT Frequency 2x / week    PT Duration 4 weeks    PT Treatment/Interventions ADLs/Self Care Home Management;Aquatic Therapy;Cryotherapy;Electrical Stimulation;Iontophoresis 71m/ml Dexamethasone;Moist Heat;Ultrasound;Therapeutic activities;Therapeutic exercise;Neuromuscular re-education;Patient/family education;Manual techniques;Dry needling;Taping    PT Next Visit Plan On hold    PT Home Exercise Plan [email or web address]    Consulted and Agree with Plan of Care Patient           Patient will benefit from skilled therapeutic intervention in order  to improve the following deficits and impairments:  Increased fascial restricitons, Increased muscle spasms, Pain, Hypomobility, Impaired  flexibility, Improper body mechanics, Decreased mobility, Decreased strength, Postural dysfunction  Visit Diagnosis: Cervicalgia  Other symptoms and signs involving the musculoskeletal system  Abnormal posture     Problem List Patient Active Problem List   Diagnosis Date Noted  . Radiculopathy 01/25/2020  . Hyperlipidemia associated with type 2 diabetes mellitus ([place]) 12/22/2019  . Vitamin D deficiency 09/08/2019  . Low HDL (under 40) 09/08/2019  . Pancreatic insufficiency 03/28/2018  . Type 2 diabetes mellitus with retinopathy without macular edema, with long-term current use of insulin ([place]) 09/16/2017  . DVT (deep venous thrombosis), right 04/28/2015  . Acute deep vein thrombosis (DVT) of femoral vein of right lower extremity ([place]) 04/28/2015  . Hemorrhoid 03/23/2013  . Varicose vein of leg right 03/23/2013  . Visit for preventive health examination 03/23/2013  . Diastasis recti 12/22/2012  . Tendinitis of right shoulder 09/27/2011  . Right shoulder pain 09/27/2011  . Allergic rhinitis, cause unspecified 05/23/2011  . Sleep disturbance, unspecified 02/21/2011  . 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 Preventative health care 12/05/2010  . Chronic diarrhea of unknown origin pt says not from metformin 12/05/2010  . 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  . URINARY URGENCY 01/21/2009  . Hyperlipidemia 01/23/2008  . Obstructive sleep apnea 12/22/2007  . VARICOSE VEINS, LOWER EXTREMITIES 10/03/2007  . ASTHMA 09/19/2007  . ASTHMA UNSPECIFIED WITH EXACERBATION 09/19/2007  . PLANTAR FASCIITIS, LEFT 06/23/2007  . RASH AND OTHER NONSPECIFIC SKIN ERUPTION 05/26/2007  . EDEMA 05/26/2007  . Class 2 severe obesity with serious comorbidity and body mass index (BMI) of 39.0 to 39.9 in adult ([place]) 02/13/2007  . DVT, HX OF 01/15/2007  . 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    PHYSICAL THERAPY DISCHARGE SUMMARY  Visits from Start of Care: 9  Current functional level related to goals  / functional outcomes: See above note for last known status.   Remaining deficits: See above   Education / Equipment: Home program  Plan:                                                    Patient goals were not met. Patient is being discharged due to a change in medical status.  ?????         Thank you for the referral of this patient. Rudell Cobb, MPT  [place] , PT 02/16/2020, 10:41 AM  [facility] [place], [place], [address] Phone: [phone]   Fax:  [phone]  Name: AHMON TOSI MRN: [id number] Date of Birth: 1970/03/31

## 2020-02-18 ENCOUNTER — Other Ambulatory Visit: Payer: Self-pay | Admitting: Internal Medicine

## 2020-02-19 ENCOUNTER — Encounter: Payer: BC Managed Care – PPO | Admitting: Physical Therapy

## 2020-02-29 ENCOUNTER — Ambulatory Visit (INDEPENDENT_AMBULATORY_CARE_PROVIDER_SITE_OTHER): Payer: BC Managed Care – PPO | Admitting: Adult Health

## 2020-02-29 ENCOUNTER — Other Ambulatory Visit: Payer: Self-pay

## 2020-02-29 ENCOUNTER — Encounter (INDEPENDENT_AMBULATORY_CARE_PROVIDER_SITE_OTHER): Payer: Self-pay | Admitting: Adult Health

## 2020-02-29 VITALS — BP 98/63 | HR 75 | Temp 97.8°F | Ht 71.0 in | Wt 276.0 lb

## 2020-02-29 DIAGNOSIS — E118 Type 2 diabetes mellitus with unspecified complications: Secondary | ICD-10-CM

## 2020-02-29 DIAGNOSIS — Z794 Long term (current) use of insulin: Secondary | ICD-10-CM

## 2020-02-29 DIAGNOSIS — M542 Cervicalgia: Secondary | ICD-10-CM | POA: Insufficient documentation

## 2020-02-29 DIAGNOSIS — E559 Vitamin D deficiency, unspecified: Secondary | ICD-10-CM | POA: Diagnosis not present

## 2020-02-29 DIAGNOSIS — Z6838 Body mass index (BMI) 38.0-38.9, adult: Secondary | ICD-10-CM

## 2020-02-29 NOTE — Progress Notes (Signed)
Chief Complaint:   OBESITY Troy [name] is here to discuss his progress with his obesity treatment plan along with follow-up of his obesity related diagnoses. [name] is on the Category 3 Plan and states he is following his eating plan approximately 60% of the time. [name] states he is walking 15 minutes 3-4 times per week.  Today's visit was #: 12 Starting weight: 293 lbs Starting date: 09/07/2019 Today's weight: 276 lbs Today's date: 02/29/2020 Total lbs lost to date: 17 Total lbs lost since last in-office visit: 0  Interim History: Troy [name] has been eating off plan more frequently, i.e., pizza, Chinese, and [employer]. His weight fluctuated up and down the last 3 weeks with eating off plan, however, he is only up 1 lb at weigh-in today. Her has been unable to check blood glucose levels due to [place] system being on backorder. He denies any recent sx's of hypoglycemia.  His Endocrinologist has been steadily decreasing his daily insulin.  Subjective:   Type 2 diabetes mellitus with complication, with long-term current use of insulin ([place]). 11/30/2019 A1c 5.9 - at goal. Troy [name] is on metformin XR 500 4 tabs daily, Semaglutide 1 mg every week, Humalog 20 units daily via [facility] system, and Invokana 100 mg daily.   Lab Results  Component Value Date   HGBA1C 5.9 (A) 11/30/2019   HGBA1C 6.4 (A) 07/27/2019   HGBA1C 7.5 (A) 04/27/2019   Lab Results  Component Value Date   MICROALBUR 0.5 02/08/2020   LDLCALC 74 02/08/2020   CREATININE 0.74 (L) 12/21/2019   No results found for: INSULIN  Vitamin D deficiency. Vitamin D level on 12/21/2019 was 56.8 - at goal. [name] is on Ergocalciferol. No nausea, vomiting, or muscle weakness.    Ref. Range 12/21/2019 14:33  Vitamin D, 25-Hydroxy Latest Ref Range: 30.0 - 100.0 ng/mL 56.8   Cervicalgia. On 02/10/2020 Troy [name] received an epidural steroid injection in the cervical neck. PT has been stopped. He reports increased numbness of his  right hand.  Assessment/Plan:   Type 2 diabetes mellitus with complication, with long-term current use of insulin ([place]). Good blood sugar control is important to decrease the likelihood of diabetic complications such as nephropathy, neuropathy, limb loss, blindness, coronary artery disease, and death. Intensive lifestyle modification including diet, exercise and weight loss are the first line of treatment for diabetes. [name] will continue current antidiabetic regimen as directed and will follow-up with Dr. Cruzita Lederer of Endocrinology as scheduled.  Vitamin D deficiency. Low Vitamin D level contributes to fatigue and are associated with obesity, breast, and colon cancer. He will continue Ergocalciferol as directed (no medication refill today) and will follow-up for routine testing of Vitamin D every 3 months.   Cervicalgia. [name] will continue close follow-up with the orthopedic specialist as scheduled.  Class 2 severe obesity with serious comorbidity and body mass index (BMI) of 38.0 to 38.9 in adult, unspecified obesity type ([place]).  Troy [name] is currently in the action stage of change. As such, his goal is to continue with weight loss efforts. He has agreed to the Category 3 Plan and will journal 250-350 calories and 25 grams of protein at breakfast.  Handouts were provided on Holiday Strategies, Holiday Recipes, and Hypoglycemia Information Sheet.   Exercise goals: Troy [name] will continue walking 15 minutes 3-4 times per week.  Behavioral modification strategies: increasing lean protein intake, increasing high fiber foods, decreasing eating out, meal planning and cooking strategies, better snacking choices, holiday eating strategies  and planning for  success.  Troy [name] has agreed to follow-up with our clinic in 3 weeks. He was informed of the importance of frequent follow-up visits to maximize his success with intensive lifestyle modifications for his multiple health conditions.   Objective:    Blood pressure 98/63, pulse 75, temperature 97.8 F (36.6 C), height 5\' 11"  (1.803 m), weight 276 lb (125.2 kg), SpO2 100 %. Body mass index is 38.49 kg/m.  General: Cooperative, alert, well developed, in no acute distress. HEENT: Conjunctivae and lids unremarkable. Cardiovascular: Regular rhythm.  Lungs: Normal work of breathing. Neurologic: No focal deficits.   Lab Results  Component Value Date   CREATININE 0.74 (L) 12/21/2019   BUN 13 12/21/2019   NA 142 12/21/2019   K 4.8 12/21/2019   CL 103 12/21/2019   CO2 26 12/21/2019   Lab Results  Component Value Date   ALT 18 12/21/2019   AST 11 12/21/2019   ALKPHOS 71 12/21/2019   BILITOT 0.5 12/21/2019   Lab Results  Component Value Date   HGBA1C 5.9 (A) 11/30/2019   HGBA1C 6.4 (A) 07/27/2019   HGBA1C 7.5 (A) 04/27/2019   HGBA1C 6.7 (A) 12/26/2018   HGBA1C 7.3 (A) 09/16/2017   No results found for: INSULIN Lab Results  Component Value Date   TSH 1.32 04/27/2019   Lab Results  Component Value Date   CHOL 131 02/08/2020   HDL 37 (L) 02/08/2020   LDLCALC 74 02/08/2020   LDLDIRECT 125.0 12/26/2018   TRIG 117 02/08/2020   CHOLHDL 3.5 02/08/2020   Lab Results  Component Value Date   WBC 10.7 02/08/2020   HGB 17.3 (H) 02/08/2020   HCT 50.1 (H) 02/08/2020   MCV 94.4 02/08/2020   PLT 234 02/08/2020   Lab Results  Component Value Date   IRON 144 02/08/2020   TIBC 358 02/08/2020   FERRITIN 96 02/08/2020   Attestation Statements:   Reviewed by clinician on day of visit: allergies, medications, problem list, medical history, surgical history, family history, social history, and previous encounter notes.  Time spent on visit including pre-visit chart review and post-visit charting and care was 35 minutes.   I, Michaelene Song, am acting as Location manager for [employer], NP-C   I have reviewed the above documentation for accuracy and completeness, and I agree with the above. -  Arita Severtson d. Jeremaih Klima, NP-C

## 2020-03-07 NOTE — Telephone Encounter (Signed)
I do not see any documentation of these contacts messages.  Nevertheless it is okay to temporarily hold the Eliquis for the epidural injection.  By protocol. 3  Days  pre procedure  Please notify  [facility] imaging

## 2020-03-09 ENCOUNTER — Other Ambulatory Visit: Payer: Self-pay | Admitting: Internal Medicine

## 2020-03-23 ENCOUNTER — Ambulatory Visit (INDEPENDENT_AMBULATORY_CARE_PROVIDER_SITE_OTHER): Payer: BC Managed Care – PPO | Admitting: Adult Health

## 2020-03-23 ENCOUNTER — Encounter (INDEPENDENT_AMBULATORY_CARE_PROVIDER_SITE_OTHER): Payer: Self-pay

## 2020-03-28 ENCOUNTER — Encounter (INDEPENDENT_AMBULATORY_CARE_PROVIDER_SITE_OTHER): Payer: Self-pay

## 2020-03-28 ENCOUNTER — Encounter (INDEPENDENT_AMBULATORY_CARE_PROVIDER_SITE_OTHER): Payer: Self-pay | Admitting: Adult Health

## 2020-03-28 ENCOUNTER — Other Ambulatory Visit: Payer: Self-pay

## 2020-03-28 ENCOUNTER — Telehealth (INDEPENDENT_AMBULATORY_CARE_PROVIDER_SITE_OTHER): Payer: BC Managed Care – PPO | Admitting: Adult Health

## 2020-03-28 DIAGNOSIS — E559 Vitamin D deficiency, unspecified: Secondary | ICD-10-CM | POA: Diagnosis not present

## 2020-03-28 DIAGNOSIS — Z6838 Body mass index (BMI) 38.0-38.9, adult: Secondary | ICD-10-CM

## 2020-03-28 DIAGNOSIS — K5900 Constipation, unspecified: Secondary | ICD-10-CM

## 2020-03-28 DIAGNOSIS — E118 Type 2 diabetes mellitus with unspecified complications: Secondary | ICD-10-CM

## 2020-03-28 DIAGNOSIS — Z794 Long term (current) use of insulin: Secondary | ICD-10-CM

## 2020-03-30 NOTE — Progress Notes (Signed)
TeleHealth Visit:  Due to the COVID-19 pandemic, this visit was completed with telemedicine (audio/video) technology to reduce patient and provider exposure as well as to preserve personal protective equipment.   Troy [name] has verbally consented to this TeleHealth visit. The patient is located at home, the provider is located at the [facility] office. The participants in this visit include the listed provider and patient. The visit was conducted today via video.   Chief Complaint: OBESITY Troy [name] is here to discuss his progress with his obesity treatment plan along with follow-up of his obesity related diagnoses. Troy [name] is on the Category 3 Plan + 250-350 calories and 25 grams protein and states he is following his eating plan approximately 90% of the time. Troy [name] states he is walking 10-15 minutes 5 times per week.  Today's visit was #: 13 Starting weight: 293 lbs Starting date: 09/07/2019  Interim History: Troy [name]'s home scale weights reports 272 lbs today. He will be traveling to [place] to go to [place] [employer] with his immediate family at the end of January. The cruise to [place] was canceled due to strict COVID testing protocols. He has been focusing on protein at each meal.  Subjective:   1. Type 2 diabetes mellitus with complication, with long-term current use of insulin (Troy [name]) Dr. Cruzita Lederer with Endocrinology decreased Troy [name]'s basal insulin in [place] system 11/30/2019. He is on Metformin XR 500 mg 4 tabs every morning, Invokana 100 mg every morning, and Ozempic 1 mg every week. He reports fasting ambulatory blood glucose 110-150 and denies episodes of hypoglycemia.    2. Vitamin D deficiency Troy [name]'s Vitamin D level was 56.8 on 12/21/2019. He is currently taking OTC vitamin D each day. He denies nausea, vomiting or muscle weakness.  Ref. Range 12/21/2019 14:33  Vitamin D, 25-Hydroxy Latest Ref Range: 30.0 - 100.0 ng/mL 56.8   3. Constipation, unspecified constipation  type Constipation developed after starting Category 3 meal plan. Patient will have bowel movement every 2-4 days. He denies hematochezia. He previously experienced diarrhea. He has had several colonoscopy studies, the last being 04/14/2018 - 13mm polyp removed.  Assessment/Plan:   1. Type 2 diabetes mellitus with complication, with long-term current use of insulin ([name]) Good blood sugar control is important to decrease the likelihood of diabetic complications such as nephropathy, neuropathy, limb loss, blindness, coronary artery disease, and death. Intensive lifestyle modification including diet, exercise and weight loss are the first line of treatment for diabetes. Continue current anti-diabetic regimen. Continue to closely monitor monitor blood glucose.  2. Vitamin D deficiency Low Vitamin D level contributes to fatigue and are associated with obesity, breast, and colon cancer. He agrees to continue to take OTC Vit D supplement and will follow-up for routine testing of Vitamin D, at least 2-3 times per year to avoid over-replacement.  3. Constipation, unspecified constipation type Continue excellent water intake. Increase fruits and vegetables. OTC Miralax per manufacturer instructions.  4. Class 2 severe obesity with serious comorbidity and body mass index (BMI) of 38.0 to 38.9 in adult, unspecified obesity type [facility]) Troy [name] is currently in the action stage of change. As such, his goal is to continue with weight loss efforts. He has agreed to the Category 3 Plan.   Exercise goals: As is  Behavioral modification strategies: increasing lean protein intake, decreasing eating out, meal planning and cooking strategies, travel eating strategies and planning for success.  Troy [name] has agreed to follow-up with our clinic in 2 weeks. He was informed of the importance  of frequent follow-up visits to maximize his success with intensive lifestyle modifications for his multiple health  conditions.  Objective:   VITALS: Per patient if applicable, see vitals. GENERAL: Alert and in no acute distress. CARDIOPULMONARY: No increased WOB. Speaking in clear sentences.  PSYCH: Pleasant and cooperative. Speech normal rate and rhythm. Affect is appropriate. Insight and judgement are appropriate. Attention is focused, linear, and appropriate.  NEURO: Oriented as arrived to appointment on time with no prompting.   Lab Results  Component Value Date   CREATININE 0.74 (L) 12/21/2019   BUN 13 12/21/2019   NA 142 12/21/2019   K 4.8 12/21/2019   CL 103 12/21/2019   CO2 26 12/21/2019   Lab Results  Component Value Date   ALT 18 12/21/2019   AST 11 12/21/2019   ALKPHOS 71 12/21/2019   BILITOT 0.5 12/21/2019   Lab Results  Component Value Date   HGBA1C 5.9 (A) 11/30/2019   HGBA1C 6.4 (A) 07/27/2019   HGBA1C 7.5 (A) 04/27/2019   HGBA1C 6.7 (A) 12/26/2018   HGBA1C 7.3 (A) 09/16/2017   No results found for: INSULIN Lab Results  Component Value Date   TSH 1.32 04/27/2019   Lab Results  Component Value Date   CHOL 131 02/08/2020   HDL 37 (L) 02/08/2020   LDLCALC 74 02/08/2020   LDLDIRECT 125.0 12/26/2018   TRIG 117 02/08/2020   CHOLHDL 3.5 02/08/2020   Lab Results  Component Value Date   WBC 10.7 02/08/2020   HGB 17.3 (H) 02/08/2020   HCT 50.1 (H) 02/08/2020   MCV 94.4 02/08/2020   PLT 234 02/08/2020   Lab Results  Component Value Date   IRON 144 02/08/2020   TIBC 358 02/08/2020   FERRITIN 96 02/08/2020    Attestation Statements:   Reviewed by clinician on day of visit: allergies, medications, problem list, medical history, surgical history, family history, social history, and previous encounter notes.  Time spent on visit including pre-visit chart review and post-visit charting and care was 32 minutes.   Coral Ceo, am acting as Location manager for Mina Marble, NP.  I have reviewed the above documentation for accuracy and completeness, and I  agree with the above. - Pearly Bartosik d. Ddanford, NP-C

## 2020-03-31 DIAGNOSIS — K5900 Constipation, unspecified: Secondary | ICD-10-CM | POA: Insufficient documentation

## 2020-04-01 ENCOUNTER — Ambulatory Visit
Admission: RE | Admit: 2020-04-01 | Discharge: 2020-04-01 | Disposition: A | Discharge status: Home / Self Care | Payer: BC Managed Care – PPO | Source: Ambulatory Visit | Attending: Family Medicine | Admitting: Family Medicine

## 2020-04-01 ENCOUNTER — Other Ambulatory Visit: Payer: Self-pay

## 2020-04-01 DIAGNOSIS — M5412 Radiculopathy, cervical region: Secondary | ICD-10-CM

## 2020-04-01 DIAGNOSIS — M50123 Cervical disc disorder at C6-C7 level with radiculopathy: Secondary | ICD-10-CM | POA: Diagnosis not present

## 2020-04-01 MED ORDER — IOPAMIDOL (ISOVUE-M 300) INJECTION 61%
1.0000 mL | Freq: Once | INTRAMUSCULAR | Status: AC | PRN
  Administered 2020-04-01: 1 mL via EPIDURAL

## 2020-04-01 MED ORDER — TRIAMCINOLONE ACETONIDE 40 MG/ML IJ SUSP (RADIOLOGY)
60.0000 mg | Freq: Once | INTRAMUSCULAR | Status: AC
  Administered 2020-04-01: 60 mg via EPIDURAL

## 2020-04-01 NOTE — Discharge Instructions (Signed)
Post Procedure Spinal Discharge Instruction Sheet  1. You may resume a regular diet and any medications that you routinely take (including pain medications).  2. No driving day of procedure.  3. Light activity throughout the rest of the day.  Do not do any strenuous work, exercise, bending or lifting.  The day following the procedure, you can resume normal physical activity but you should refrain from exercising or physical therapy for at least three days thereafter.   Common Side Effects:   Headaches- take your usual medications as directed by your physician.  Increase your fluid intake.  Caffeinated beverages may be helpful.  Lie flat in bed until your headache resolves.   Restlessness or inability to sleep- you may have trouble sleeping for the next few days.  Ask your referring physician if you need any medication for sleep.   Facial flushing or redness- should subside within a few days.   Increased pain- a temporary increase in pain a day or two following your procedure is not unusual.  Take your pain medication as prescribed by your referring physician.   Leg cramps  Please contact our office at [phone] for the following symptoms:  Fever greater than 100 degrees.  Headaches unresolved with medication after 2-3 days.  Increased swelling, pain, or redness at injection site.   You may resume your Eliquis in 24 hours tomorrow on 04/02/20.

## 2020-04-05 ENCOUNTER — Ambulatory Visit: Payer: BC Managed Care – PPO | Admitting: Internal Medicine

## 2020-04-08 ENCOUNTER — Other Ambulatory Visit: Payer: Self-pay

## 2020-04-08 ENCOUNTER — Ambulatory Visit (INDEPENDENT_AMBULATORY_CARE_PROVIDER_SITE_OTHER): Payer: BC Managed Care – PPO | Admitting: Family Medicine

## 2020-04-08 ENCOUNTER — Encounter: Payer: Self-pay | Admitting: Family Medicine

## 2020-04-08 VITALS — Ht 71.0 in | Wt 276.0 lb

## 2020-04-08 DIAGNOSIS — M5412 Radiculopathy, cervical region: Secondary | ICD-10-CM | POA: Diagnosis not present

## 2020-04-08 DIAGNOSIS — M62838 Other muscle spasm: Secondary | ICD-10-CM

## 2020-04-08 MED ORDER — TRAMADOL HCL 50 MG PO TABS
50.0000 mg | ORAL_TABLET | Freq: Three times a day (TID) | ORAL | 0 refills | Status: DC | PRN
Start: 2020-04-08 — End: 2020-05-19

## 2020-04-08 NOTE — Patient Instructions (Addendum)
Thank you for coming in today.  Please follow up with Dr [name].   I think we have all the things.

## 2020-04-08 NOTE — Progress Notes (Signed)
I, Troy [name], LAT, ATC acting as a scribe for Troy Leader, MD.  Troy [name] is a 50 y.o. male who presents to [place] [facility] today for cervical radiculitis. Pt was last seen by Dr. Georgina [name] on 02/12/20 and received a second epidural steroid injection on 04/01/20. Today, pt reports continued right arm pain, posterior neck pain, and right arm weakness.  He has had 2 epidural steroid injections as above with little benefit.  He had prior neck surgery with Dr. Arnoldo [name].   Dx imaging: 01/30/20 C-spine MRI  12/14/19 C-spine XR  Pertinent review of systems: No fevers or chills  Relevant historical information: DVT, diabetes, history of right peripheral nerve injury.   Exam:  Ht 5\' 11"  (1.803 m)   Wt 276 lb (125.2 kg)   BMI 38.49 kg/m  General: Well Developed, well nourished, and in no acute distress.   MSK: C-spine normal-appearing nontender midline normal cervical motion. Upper extremity strength is intact with exception of right hand intrinsic hand muscles which are persistently weakened. Reflexes are intact throughout upper extremities.    Lab and Radiology Results   EXAM: MRI CERVICAL SPINE WITHOUT CONTRAST  TECHNIQUE: Multiplanar, multisequence MR imaging of the cervical spine was performed. No intravenous contrast was administered.  COMPARISON:  Cervical spine MRI 04/30/2008.  FINDINGS: Alignment: Chronic straightening and mild reversal of cervical lordosis, stable since 2010.  Vertebrae: Chronic C7-T1 ACDF with mild associated hardware susceptibility artifact.  There is mild to moderate degenerative appearing marrow edema in the left side facets of C2-C3 (series 4, image 13), new since 2010. Trace surrounding soft tissue inflammation. See additional details of that level below.  No other marrow edema or acute osseous abnormality. Mild degenerative endplate changes throughout the cervical spine but otherwise visualized  bone marrow signal is within normal limits.  Cord: No definite cervical spinal cord signal abnormality despite multilevel spinal stenosis with some cord mass effect, detailed below.  Posterior Fossa, vertebral arteries, paraspinal tissues: Cervicomedullary junction is within normal limits. Negative visible posterior fossa. Preserved major vascular flow voids in the neck, the left vertebral artery appears dominant as before.  Negative visible neck soft tissues aside from those adjacent to the C2-C3 facet described earlier. Negative visible lung apices.  Disc levels:  C2-C3: Moderate facet hypertrophy is greater on the left and progressed since 2010. Superimposed left facet marrow edema. Mild disc bulge eccentric to the right. No spinal stenosis. Only mild left C3 foraminal stenosis.  C3-C4: Chronic mildly lobulated right paracentral disc protrusion. Mild facet hypertrophy. Mild spinal stenosis. No spinal cord mass effect. Mild to moderate right C4 foraminal stenosis. This level is progressed.  C4-C5: Chronic circumferential disc bulge and endplate spurring with a new or increased superimposed central disc herniation (series 5, image 15). Mild facet hypertrophy. Spinal stenosis with mild ventral cord mass effect. No foraminal stenosis. This level is progressed.  C5-C6: Chronic circumferential disc bulge and endplate spurring eccentric to the right. Superimposed mild to moderate facet hypertrophy on the right. Mild spinal stenosis and chronic right hemi cord mass effect (series 5, image 19). Moderate to severe right C6 foraminal stenosis has increased.  C6-C7: Chronic circumferential but mostly far lateral disc bulge and endplate spurring. No significant stenosis.  C7-T1:  Prior ACDF with evidence of solid arthrodesis.  No stenosis.  T1-T2: Mostly far lateral disc bulging and endplate spurring. Mild facet hypertrophy greater on the left. There is a small  left paracentral component of disc on series  5, image 34. But no significant spinal or foraminal stenosis.  T2-T3: Right paracentral disc bulge or protrusion is evident on series 2, image 5. No definite stenosis.  IMPRESSION: 1. Prior C7-T1 ACDF with solid arthrodesis.  2. 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 Query left upper neck pain.  3. Fairly mild adjacent segment disease at both C6-C7 and T1-T2, without significant stenosis.  4. Chronic disc degeneration C3-C4 through C5-C6, progressed since 2010 at C4-C5 along with mild spinal stenosis and mild cord mass effect at that level. No associated spinal cord signal abnormality. Moderate to severe right C6 and moderate right C4 foraminal stenosis have progressed.   Electronically Signed   By: [name] M.D.   On: 01/31/2020 09:09  I, Troy [name], personally (independently) visualized and performed the interpretation of the images attached in this note.     Assessment and Plan: 50 y.o. male with right arm pain thought to be due to cervical radiculopathy at C6 associated with neck pain.  Not improving with epidural steroid injections and conservative management including physical therapy.  At this point failing conservative management.  Doubtful that 1/3 injection will help much.  Plan to refer to neurosurgery to discuss surgical options.  Recheck back with me as needed.   PDMP not reviewed this encounter. Orders Placed This Encounter  Procedures  . Ambulatory referral to Neurosurgery    Referral Priority:   Routine    Referral Type:   Surgical    Referral Reason:   Specialty Services Required    Referred to Provider:   Newman Pies, MD    Requested Specialty:   Neurosurgery    Number of Visits Requested:   1   No orders of the defined types were placed in this encounter.    Discussed warning signs or symptoms. Please see discharge instructions. Patient expresses  understanding.   The above documentation has been reviewed and is accurate and complete Troy [name], M.D.

## 2020-04-11 ENCOUNTER — Other Ambulatory Visit: Payer: Self-pay

## 2020-04-11 ENCOUNTER — Encounter (INDEPENDENT_AMBULATORY_CARE_PROVIDER_SITE_OTHER): Payer: Self-pay | Admitting: Adult Health

## 2020-04-11 ENCOUNTER — Ambulatory Visit (INDEPENDENT_AMBULATORY_CARE_PROVIDER_SITE_OTHER): Payer: BC Managed Care – PPO | Admitting: Adult Health

## 2020-04-11 VITALS — BP 105/71 | HR 97 | Temp 97.7°F | Ht 71.0 in | Wt 269.0 lb

## 2020-04-11 DIAGNOSIS — Z6837 Body mass index (BMI) 37.0-37.9, adult: Secondary | ICD-10-CM

## 2020-04-11 DIAGNOSIS — M5412 Radiculopathy, cervical region: Secondary | ICD-10-CM

## 2020-04-11 DIAGNOSIS — E113299 Type 2 diabetes mellitus with mild nonproliferative diabetic retinopathy without macular edema, unspecified eye: Secondary | ICD-10-CM | POA: Diagnosis not present

## 2020-04-11 DIAGNOSIS — Z794 Long term (current) use of insulin: Secondary | ICD-10-CM

## 2020-04-12 DIAGNOSIS — M5412 Radiculopathy, cervical region: Secondary | ICD-10-CM | POA: Insufficient documentation

## 2020-04-12 NOTE — Progress Notes (Signed)
Chief Complaint:   OBESITY Troy [name] is here to discuss his progress with his obesity treatment plan along with follow-up of his obesity related diagnoses. Troy [name] is on the Category 3 Plan and states he is following his eating plan approximately 50% of the time. Troy [name] states he is walking 15 minutes 5 times per week.  Today's visit was #: 14 Starting weight: 293 lbs Starting date: 09/07/2019 Today's weight: 269 lbs Today's date: 04/11/2020 Total lbs lost to date: 24 lbs Total lbs lost since last in-office visit: 7 lbs  Interim History: Troy [name]'s mother-in-law was hospitalized 2 weeks ago due to severe pneumonia (not r/t to COVID-19). He has been eating off plan the last week due to working 3rd shift and supporting his wife during his mother-in-law's acute illness. He and his immediate family, will be traveling to [place] 04/26/2020-05/05/2020. Cruise and travel to [place] were cancelled due to PCR testing requirements and Omicron surge. He continues to experience chronic cervical neck pain with radiculopathy.  Subjective:   1. 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 Fasting ambulatory BG have been elevated due to recent steroid injection on 04/01/2020.  Lab Results  Component Value Date   HGBA1C 5.9 (A) 11/30/2019   HGBA1C 6.4 (A) 07/27/2019   HGBA1C 7.5 (A) 04/27/2019   Lab Results  Component Value Date   MICROALBUR 0.5 02/08/2020   LDLCALC 74 02/08/2020   CREATININE 0.74 (L) 12/21/2019   No results found for: INSULIN  2. Cervical radiculitis C6-C7 epidural steroid injection on 04/01/2020. Pt is continuing to experience right arm pain with associated right arm weakness. He also reports posterior neck pain. He is right  hand dominant and reports dropping items like cans, forks.   Assessment/Plan:   1. 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 Intensive lifestyle modification including diet, exercise and weight loss are the first line of treatment for diabetes. Fasting blood sugar checks average >200. They are starting to trend down. Continue current anti-diabetic regimen. Pt's next endocrinology appt is 06/10/2020.  2. Cervical radiculitis Ortho has referred pt to neurosurgery to discuss surgical interventions options.  3. Class 2 severe obesity with serious comorbidity and body mass index (BMI) of 37.0 to 37.9 in adult, unspecified obesity type [facility]) Troy [name] is currently in the action stage of change. As such, his goal is to continue with weight loss efforts. He has agreed to the Category 3 Plan and keeping a food journal and adhering to recommended goals of 250-350 calories and 25 g protein.   Handout: Multiple recipes, Category 3 Grocery List. Recommend changing dinner and lunch timing.  Exercise goals: As is  Behavioral modification strategies: increasing lean protein intake, meal planning and cooking strategies and planning for success.  Troy [name] has agreed to follow-up with our clinic in 2 weeks. He was informed of the importance of frequent follow-up visits to maximize his success with intensive lifestyle modifications for his multiple health conditions.   Objective:   Blood pressure 105/71, pulse 97, temperature 97.7 F (36.5 C), height 5\' 11"  (1.803  m), weight 269 lb (122 kg), SpO2 98 %. Body mass index is 37.52 kg/m.  General: Cooperative, alert, well developed, in no acute distress. HEENT:  Conjunctivae and lids unremarkable. Cardiovascular: Regular rhythm.  Lungs: Normal work of breathing. Neurologic: No focal deficits.   Lab Results  Component Value Date   CREATININE 0.74 (L) 12/21/2019   BUN 13 12/21/2019   NA 142 12/21/2019   K 4.8 12/21/2019   CL 103 12/21/2019   CO2 26 12/21/2019   Lab Results  Component Value Date   ALT 18 12/21/2019   AST 11 12/21/2019   ALKPHOS 71 12/21/2019   BILITOT 0.5 12/21/2019   Lab Results  Component Value Date   HGBA1C 5.9 (A) 11/30/2019   HGBA1C 6.4 (A) 07/27/2019   HGBA1C 7.5 (A) 04/27/2019   HGBA1C 6.7 (A) 12/26/2018   HGBA1C 7.3 (A) 09/16/2017   No results found for: INSULIN Lab Results  Component Value Date   TSH 1.32 04/27/2019   Lab Results  Component Value Date   CHOL 131 02/08/2020   HDL 37 (L) 02/08/2020   LDLCALC 74 02/08/2020   LDLDIRECT 125.0 12/26/2018   TRIG 117 02/08/2020   CHOLHDL 3.5 02/08/2020   Lab Results  Component Value Date   WBC 10.7 02/08/2020   HGB 17.3 (H) 02/08/2020   HCT 50.1 (H) 02/08/2020   MCV 94.4 02/08/2020   PLT 234 02/08/2020   Lab Results  Component Value Date   IRON 144 02/08/2020   TIBC 358 02/08/2020   FERRITIN 96 02/08/2020    Attestation Statements:   Reviewed by clinician on day of visit: allergies, medications, problem list, medical history, surgical history, family history, social history, and previous encounter notes.  Time spent on visit including pre-visit chart review and post-visit care and charting was 33 minutes.   Coral Ceo, am acting as Location manager for Mina Marble, NP.  I have reviewed the above documentation for accuracy and completeness, and I agree with the above. -  Anastashia Westerfeld d. [place]afen, NP-C

## 2020-04-15 DIAGNOSIS — G4733 Obstructive sleep apnea (adult) (pediatric): Secondary | ICD-10-CM

## 2020-04-17 NOTE — Telephone Encounter (Signed)
Certainly I have placed referral to pulmonary sleep  [place]

## 2020-04-19 DIAGNOSIS — M4722 Other spondylosis with radiculopathy, cervical region: Secondary | ICD-10-CM

## 2020-04-19 HISTORY — DX: Other spondylosis with radiculopathy, cervical region: M47.22

## 2020-04-21 ENCOUNTER — Other Ambulatory Visit: Payer: Self-pay | Admitting: Neurosurgery

## 2020-04-25 ENCOUNTER — Other Ambulatory Visit: Payer: Self-pay

## 2020-04-25 ENCOUNTER — Ambulatory Visit (INDEPENDENT_AMBULATORY_CARE_PROVIDER_SITE_OTHER): Payer: BC Managed Care – PPO | Admitting: Adult Health

## 2020-04-25 ENCOUNTER — Encounter: Payer: Self-pay | Admitting: Pulmonary Disease

## 2020-04-25 ENCOUNTER — Encounter (INDEPENDENT_AMBULATORY_CARE_PROVIDER_SITE_OTHER): Payer: Self-pay | Admitting: Adult Health

## 2020-04-25 VITALS — BP 104/68 | HR 84 | Temp 97.8°F | Ht 71.0 in | Wt 269.0 lb

## 2020-04-25 DIAGNOSIS — M5412 Radiculopathy, cervical region: Secondary | ICD-10-CM | POA: Diagnosis not present

## 2020-04-25 DIAGNOSIS — Z794 Long term (current) use of insulin: Secondary | ICD-10-CM | POA: Diagnosis not present

## 2020-04-25 DIAGNOSIS — Z6837 Body mass index (BMI) 37.0-37.9, adult: Secondary | ICD-10-CM

## 2020-04-25 DIAGNOSIS — E118 Type 2 diabetes mellitus with unspecified complications: Secondary | ICD-10-CM

## 2020-04-26 NOTE — Progress Notes (Signed)
Chief Complaint:   OBESITY Troy [name] is here to discuss his progress with his obesity treatment plan along with follow-up of his obesity related diagnoses. Troy [name] is on the Category 3 Plan and keeping a food journal and adhering to recommended goals of 250-350 calories and 25 g protein and states he is following his eating plan approximately 70% of the time. Troy [name] states he is walking 15 minutes 5 times per week.  Today's visit was #: 15 Starting weight: 293 lbs Starting date: 09/07/2019 Today's weight: 269 lbs Today's date: 04/25/2020 Total lbs lost to date: 24 lbs Total lbs lost since last in-office visit: 0  Interim History: Troy [name] will travel to [place] to [employer]. He will be on the [facility] for 10 days. Hi  "mask strap" for his home CPAP recently broke.He was able to find a replacement piece for face mask. He was instructed to follow up with his pulmonologist for evaluation/CPAP adjustment. His appointment is scheduled for 05/19/2020.   Subjective:   1. Cervical radiculitis Pt's cervical radiculitis has been treated with formal sessions of PT, epidural steroid injection, and oral pain control.  He was recently seen by Dr. Jacqulynn Cadet Jenkins/Neurosurgery and is scheduled for ACDF 05/25/20 .  2. Type 2 diabetes mellitus with complication, with long-term current use of insulin ([name]) Pt's ambulatory fasting BG 120-140. He is on Metformin, Ozempic, Invokana, and Humalog via V-Go 20 units. He reports consistently changing V-Go system every 24 hours.  Lab Results  Component Value Date   HGBA1C 5.9 (A) 11/30/2019   HGBA1C 6.4 (A) 07/27/2019   HGBA1C 7.5 (A) 04/27/2019   Lab Results  Component Value Date   MICROALBUR 0.5 02/08/2020   LDLCALC 74 02/08/2020   CREATININE 0.74 (L) 12/21/2019   No results found for: INSULIN  Assessment/Plan:   1. Cervical radiculitis Follow up with neurosurgeon as directed.  2. Type 2 diabetes mellitus with complication, with  long-term current use of insulin ([name]) Good blood sugar control is important to decrease the likelihood of diabetic complications such as nephropathy, neuropathy, limb loss, blindness, coronary artery disease, and death. Intensive lifestyle modification including diet, exercise and weight loss are the first line of treatment for diabetes. Continue anti-diabetic regimen per endocrinologist.  3. Class 2 severe obesity with serious comorbidity and body mass index (BMI) of 37.0 to 37.9 in adult, unspecified obesity type Troy [name]) Troy [name] is currently in the action stage of change. As such, his goal is to continue with weight loss efforts. He has agreed to the Category 3 Plan.   Pt will stock is hotel room with high protein foods and focus on high protein meals when eating out.  Exercise goals: As is  Behavioral modification strategies: increasing lean protein intake, meal planning and cooking strategies, travel eating strategies and planning for success.  Troy [name] has agreed to follow-up with our clinic in 3 weeks. He was informed of the importance of frequent follow-up visits to maximize his success with intensive lifestyle modifications for his multiple health conditions.   Objective:   Blood pressure 104/68, pulse 84, temperature 97.8 F (36.6 C), height 5\' 11"  (1.803 m), weight 269 lb (122 kg), SpO2 98 %. Body mass index is 37.52 kg/m.  General: Cooperative, alert, well developed, in no acute distress. HEENT: Conjunctivae and lids unremarkable. Cardiovascular: Regular rhythm.  Lungs: Normal work of breathing. Neurologic: No focal deficits.   Lab Results  Component Value Date   CREATININE 0.74 (L) 12/21/2019   BUN 13 12/21/2019  NA 142 12/21/2019   K 4.8 12/21/2019   CL 103 12/21/2019   CO2 26 12/21/2019   Lab Results  Component Value Date   ALT 18 12/21/2019   AST 11 12/21/2019   ALKPHOS 71 12/21/2019   BILITOT 0.5 12/21/2019   Lab Results  Component Value Date   HGBA1C 5.9  (A) 11/30/2019   HGBA1C 6.4 (A) 07/27/2019   HGBA1C 7.5 (A) 04/27/2019   HGBA1C 6.7 (A) 12/26/2018   HGBA1C 7.3 (A) 09/16/2017   No results found for: INSULIN Lab Results  Component Value Date   TSH 1.32 04/27/2019   Lab Results  Component Value Date   CHOL 131 02/08/2020   HDL 37 (L) 02/08/2020   LDLCALC 74 02/08/2020   LDLDIRECT 125.0 12/26/2018   TRIG 117 02/08/2020   CHOLHDL 3.5 02/08/2020   Lab Results  Component Value Date   WBC 10.7 02/08/2020   HGB 17.3 (H) 02/08/2020   HCT 50.1 (H) 02/08/2020   MCV 94.4 02/08/2020   PLT 234 02/08/2020   Lab Results  Component Value Date   IRON 144 02/08/2020   TIBC 358 02/08/2020   FERRITIN 96 02/08/2020    Attestation Statements:   Reviewed by clinician on day of visit: allergies, medications, problem list, medical history, surgical history, family history, social history, and previous encounter notes.  Time spent on visit including pre-visit chart review and post-visit care and charting was 35 minutes.   Coral Ceo, am acting as Location manager for Mina Marble, NP.  I have reviewed the above documentation for accuracy and completeness, and I agree with the above. -  Codi Folkerts d. Aneeka Bowden, NP-C

## 2020-04-27 ENCOUNTER — Other Ambulatory Visit: Payer: Self-pay | Admitting: Internal Medicine

## 2020-05-16 ENCOUNTER — Encounter (INDEPENDENT_AMBULATORY_CARE_PROVIDER_SITE_OTHER): Payer: Self-pay | Admitting: Adult Health

## 2020-05-16 ENCOUNTER — Ambulatory Visit (INDEPENDENT_AMBULATORY_CARE_PROVIDER_SITE_OTHER): Payer: BC Managed Care – PPO | Admitting: Adult Health

## 2020-05-16 ENCOUNTER — Other Ambulatory Visit: Payer: Self-pay

## 2020-05-16 VITALS — BP 103/65 | HR 101 | Temp 97.9°F | Ht 71.0 in | Wt 267.0 lb

## 2020-05-16 DIAGNOSIS — M5412 Radiculopathy, cervical region: Secondary | ICD-10-CM | POA: Diagnosis not present

## 2020-05-16 DIAGNOSIS — Z794 Long term (current) use of insulin: Secondary | ICD-10-CM

## 2020-05-16 DIAGNOSIS — R197 Diarrhea, unspecified: Secondary | ICD-10-CM | POA: Diagnosis not present

## 2020-05-16 DIAGNOSIS — E118 Type 2 diabetes mellitus with unspecified complications: Secondary | ICD-10-CM | POA: Diagnosis not present

## 2020-05-16 DIAGNOSIS — Z6837 Body mass index (BMI) 37.0-37.9, adult: Secondary | ICD-10-CM

## 2020-05-17 NOTE — Progress Notes (Signed)
Chief Complaint:   OBESITY Troy [name] is here to discuss his progress with his obesity treatment plan along with follow-up of his obesity related diagnoses. Troy [name] is on the Category 3 Plan and states he is following his eating plan approximately 60% of the time. Troy [name] states he walked on vacation.  Today's visit was #: 16 Starting weight: 293 lbs Starting date: 09/07/2019 Today's weight: 267 lbs Today's date: 05/16/2020 Total lbs lost to date: 26 lbs Total lbs lost since last in-office visit: 2 lbs  Interim History: Troy [name] gained 11 lbs on his trip. He lost 12 lbs in the last 9 days by "following the plan quite consistently". They walked over 8 miles a day while in [place].  Subjective:   1. Cervical radiculitis Elaine has surgery scheduled 05/25/2020- ACDF with  Dr. [name].  2. Type 2 diabetes mellitus with complication, with long-term current use of insulin ([place]) Troy [name] is followed by Dr. Renne Crigler ar endocrinology. His ambulatory fasting blood sugar is 100-120 and post prandial 160-170. He is on Metformin XR 4 tabs with breakfast, Ozempic 1 mg once a week, Invokana 100 mg daily, VGo pump 80 units (up to).  3. Diarrhea, unspecified type Troy [name] has experienced loose stools and dyspepsia "surface tasting burp". Symptoms started last night around 6 PM. He has had 5 loose stools since then. He denies pain or hematochezia. This occurred 18 months ago. Symptoms present for 1 week. His last colonoscopy was 04/2018 and normal. He has been home from [place] 9 days. He denies any change in diet.   Assessment/Plan:   1. Cervical radiculitis Increase protein intake.  2. Type 2 diabetes mellitus with complication, with long-term current use of insulin ([name]) Good blood sugar control is important to decrease the likelihood of diabetic complications such as nephropathy, neuropathy, limb loss, blindness, coronary artery disease, and death. Intensive lifestyle modification including diet,  exercise and weight loss are the first line of treatment for diabetes. Continue current anti-diabetic regimen.  3. Diarrhea, unspecified type OTC probiotics. If diarrhea persists for more than 72 hours, follow up with GI or PCP.  4. Class 2 severe obesity with serious comorbidity and body mass index (BMI) of 37.0 to 37.9 in adult, unspecified obesity type [facility]) Troy [name] is currently in the action stage of change. As such, his goal is to continue with weight loss efforts. He has agreed to the Category 3 Plan.   Exercise goals: As is  Behavioral modification strategies: increasing lean protein intake, meal planning and cooking strategies and planning for success.  Troy [name] has agreed to follow-up with our clinic in 4 weeks. He was informed of the importance of frequent follow-up visits to maximize his success with intensive lifestyle modifications for his multiple health conditions.   Objective:   Blood pressure 103/65, pulse (!) 101, temperature 97.9 F (36.6 C), height 5\' 11"  (1.803 m), weight 267 lb (121.1 kg), SpO2 98 %. Body mass index is 37.24 kg/m.  General: Cooperative, alert, well developed, in no acute distress. HEENT: Conjunctivae and lids unremarkable. Cardiovascular: Regular rhythm.  Lungs: Normal work of breathing. Neurologic: No focal deficits.   Lab Results  Component Value Date   CREATININE 0.74 (L) 12/21/2019   BUN 13 12/21/2019   NA 142 12/21/2019   K 4.8 12/21/2019   CL 103 12/21/2019   CO2 26 12/21/2019   Lab Results  Component Value Date   ALT 18 12/21/2019   AST 11 12/21/2019   ALKPHOS 71 12/21/2019   BILITOT 0.5  12/21/2019   Lab Results  Component Value Date   HGBA1C 5.9 (A) 11/30/2019   HGBA1C 6.4 (A) 07/27/2019   HGBA1C 7.5 (A) 04/27/2019   HGBA1C 6.7 (A) 12/26/2018   HGBA1C 7.3 (A) 09/16/2017   No results found for: INSULIN Lab Results  Component Value Date   TSH 1.32 04/27/2019   Lab Results  Component Value Date   CHOL 131 02/08/2020    HDL 37 (L) 02/08/2020   LDLCALC 74 02/08/2020   LDLDIRECT 125.0 12/26/2018   TRIG 117 02/08/2020   CHOLHDL 3.5 02/08/2020   Lab Results  Component Value Date   WBC 10.7 02/08/2020   HGB 17.3 (H) 02/08/2020   HCT 50.1 (H) 02/08/2020   MCV 94.4 02/08/2020   PLT 234 02/08/2020   Lab Results  Component Value Date   IRON 144 02/08/2020   TIBC 358 02/08/2020   FERRITIN 96 02/08/2020    Attestation Statements:   Reviewed by clinician on day of visit: allergies, medications, problem list, medical history, surgical history, family history, social history, and previous encounter notes.  Time spent on visit including pre-visit chart review and post-visit care and charting was 31 minutes.   Coral Ceo, am acting as Location manager for Mina Marble, NP.  I have reviewed the above documentation for accuracy and completeness, and I agree with the above. -  Guilianna Mckoy d. Allesha Aronoff, NP-C

## 2020-05-19 ENCOUNTER — Encounter: Payer: Self-pay | Admitting: Pulmonary Disease

## 2020-05-19 ENCOUNTER — Other Ambulatory Visit: Payer: Self-pay

## 2020-05-19 ENCOUNTER — Ambulatory Visit (INDEPENDENT_AMBULATORY_CARE_PROVIDER_SITE_OTHER): Payer: BC Managed Care – PPO | Admitting: Pulmonary Disease

## 2020-05-19 DIAGNOSIS — J4520 Mild intermittent asthma, uncomplicated: Secondary | ICD-10-CM | POA: Diagnosis not present

## 2020-05-19 DIAGNOSIS — G4733 Obstructive sleep apnea (adult) (pediatric): Secondary | ICD-10-CM

## 2020-05-19 NOTE — Patient Instructions (Signed)
New medium airfit F 20 mask Rx for new autoCPAP 10-14 cm will be sent to DME

## 2020-05-19 NOTE — Assessment & Plan Note (Signed)
He is very compliant with CPAP and this is certainly helped improve his daytime somnolence and fatigue.  He has lost significant weight of around 80 pounds to his current weight of 280 but has few days trial without his mask still indicates that he still has residual OSA hence will not repeat a sleep study but let him lose some more weight before we consider repeating. His machine is giving him an error message and was provided in 2016 he would be eligible for a new machine.  We will go ahead and write him a prescription for auto CPAP 10 to 14 cm.  It seems to 10 cm may be adequate based on his recent download and this is likely because of his weight loss that he is requiring lesser pressure  We will also provide him a prescription for a new full facemask which hopefully can obtain quickly  Weight loss encouraged, compliance with goal of at least 4-6 hrs every night is the expectation. Advised against medications with sedative side effects Cautioned against driving when sleepy - understanding that sleepiness will vary on a day to day basis

## 2020-05-19 NOTE — Assessment & Plan Note (Signed)
Use albuterol on an as-needed basis 

## 2020-05-19 NOTE — Progress Notes (Signed)
Subjective:    Patient ID: Troy [name], male    DOB: 1970/07/23, 50 y.o.   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 He was diagnosed with moderate to severe OSA in 23-Jun-2004 and has been maintained on CPAP since then with good improvement in his daytime somnolence and fatigue.  His last OV 04/2014 - new auto CPAP ([place]) was prescribed.  This worked well for a few years and then he started getting an error message stating that "heating coil".  He is not sure whether that means that it was not eating or overheating.  His mom, Zlatan Hornback passed away in 2017/06/23 and had her CPAP machine set at 10 cm which he started using.  CPAP download from his machine shows auto settings and average pressure of 13 cm with maximum pressure 15 cm with minimal leak and good control of events and excellent compliance. CPAP download from his mom's machine set at 10 cm from 20 06/24/18 22 shows excellent control of events on 10 cm with minimal leak and good compliance  He continues to lose weight and has lost from his original weight of 350 pounds to his current weight of 280.  For a few days he was unable to get AirFit F30 mask and he had to sleep in a recliner and his wife noted snoring. Epworth sleepiness score is 4.  Bedtime is between 2 and 4 AM, sleep latency is minimal, he sleeps on his back on his side with 1 pillow, denies nocturnal awakenings and is out of bed between 9 and 11 AM feeling rested without headaches or dryness of mouth There is no history suggestive of cataplexy, sleep paralysis or parasomnias  PMH -mild intermittent asthma, uses albuterol seldom Significant tests/ events reviewed  NPSG 03/2004 -moderate OSA,  AHI of 28 , lowest 75%, wt 350 lbs   Past Medical History:  Diagnosis Date  . Arm weakness    right   . Asthma   . B12 deficiency   . Back pain   . Diabetes insipidus ([place])   . Diabetes mellitus without complication  ([place])   . DVT (deep venous thrombosis) ([place])   . Leg edema, right   . Male infertility   . Neck injury 12/22/2013  . Obesity   . Obesity   . OSA (obstructive sleep apnea)     Past Surgical History:  Procedure Laterality Date  . BACK SURGERY    . CERVICAL DISC SURGERY     C6/C7 06-24-07  . cubital tunnel left arm     June 23, 2001  . ELBOW SURGERY    . FIBULA FRACTURE SURGERY     plate & pin removed due to infection 1995/06/24  . ULNAR NERVE REPAIR      No Known Allergies  Social History   Socioeconomic History  . Marital status: Married    Spouse name: Juliann Pulse  . Number of children: 1  . Years of education: Not on file  . Highest education level: Not on file  Occupational History  . Occupation: Therapist, [employer]  Tobacco Use  . Smoking status: Never Smoker  . Smokeless tobacco: Never Used  Vaping Use  . Vaping Use: Never used  Substance and Sexual Activity  . Alcohol use: Yes    Comment: 1 drink mo.  . Drug use: No  . Sexual activity: Yes  Other Topics Concern  . Not on file  Social History Narrative  Occupation:  days [employer] 11- 8 am  Now changed job and shift to [employer] through Thursday    Working  Ft 40 hours mostly   Married 10 10 10    Wife s/p bariatric surgery pt   Regular exercise- yes   Pt does have children   Father died suddenly 19-May-2008   Mom passes 2017  Acute rep disease after acute renal failure   Daily caffeine use one a day   2 dogs and cat.             Social Determinants of Health   Financial Resource Strain: Not on file  Food Insecurity: Not on file  Transportation Needs: Not on file  Physical Activity: Not on file  Stress: Not on file  Social Connections: Not on file  Intimate Partner Violence: Not on file    Family History  Problem Relation Age of Onset  . Heart disease Mother   . Other Mother        clotting disorders  . Diabetes Mother   . High blood pressure Mother   . Kidney disease Mother   . Depression Mother    . Sleep apnea Mother   . Obesity Mother   . Other Father        sudden death/cervical and lumbar disk disease  . Aneurysm Father        In his 45s smoked  . Hyperlipidemia Father   . High blood pressure Father   . Sudden death Father   . Alcoholism Father   . Hypertension Other   . Allergies Other   . Deep vein thrombosis Other   . Sleep apnea Other   . Obesity Other      Review of Systems   Constitutional: negative for anorexia, fevers and sweats  Eyes: negative for irritation, redness and visual disturbance  Ears, nose, mouth, throat, and face: negative for earaches, epistaxis, nasal congestion and sore throat  Respiratory: negative for cough, dyspnea on exertion, sputum and wheezing  Cardiovascular: negative for chest pain, dyspnea, lower extremity edema, orthopnea, palpitations and syncope  Gastrointestinal: negative for abdominal pain, constipation, diarrhea, melena, nausea and vomiting  Genitourinary:negative for dysuria, frequency and hematuria  Hematologic/lymphatic: negative for bleeding, easy bruising and lymphadenopathy  Musculoskeletal:negative for arthralgias, muscle weakness and stiff joints  Neurological: negative for coordination problems, gait problems, headaches and weakness  Endocrine: negative for diabetic symptoms including polydipsia, polyuria and weight loss     Objective:   Physical Exam  Gen. Pleasant, obese, in no distress, normal affect ENT - no pallor,icterus, no post nasal drip, class 2-3 airway Neck: No JVD, no thyromegaly, no carotid bruits Lungs: no use of accessory muscles, no dullness to percussion, decreased without rales or rhonchi  Cardiovascular: Rhythm regular, heart sounds  normal, no murmurs or gallops, no peripheral edema Abdomen: soft and non-tender, no hepatosplenomegaly, BS normal. Musculoskeletal: No deformities, no cyanosis or clubbing Neuro:  alert, non focal, no tremors       Assessment & Plan:

## 2020-05-23 ENCOUNTER — Other Ambulatory Visit (HOSPITAL_COMMUNITY)
Admission: RE | Admit: 2020-05-23 | Discharge: 2020-05-23 | Disposition: A | Discharge status: Home / Self Care | Payer: BC Managed Care – PPO | Source: Ambulatory Visit | Attending: Neurosurgery | Admitting: Neurosurgery

## 2020-05-23 DIAGNOSIS — Z01812 Encounter for preprocedural laboratory examination: Secondary | ICD-10-CM | POA: Diagnosis not present

## 2020-05-23 DIAGNOSIS — Z20822 Contact with and (suspected) exposure to covid-19: Secondary | ICD-10-CM | POA: Diagnosis not present

## 2020-05-23 LAB — SARS CORONAVIRUS 2 (TAT 6-24 HRS): SARS Coronavirus 2: NEGATIVE

## 2020-05-23 MED ORDER — BUPIVACAINE HCL (PF) 0.5 % IJ SOLN
INTRAMUSCULAR | Status: AC
  Filled 2020-05-23: qty 30

## 2020-05-23 MED ORDER — HYDROMORPHONE HCL 1 MG/ML IJ SOLN
INTRAMUSCULAR | Status: AC
  Filled 2020-05-23: qty 1

## 2020-05-23 MED ORDER — KETOROLAC TROMETHAMINE 15 MG/ML IJ SOLN
INTRAMUSCULAR | Status: AC
  Filled 2020-05-23: qty 1

## 2020-05-23 MED ORDER — KETOROLAC TROMETHAMINE 30 MG/ML IJ SOLN
INTRAMUSCULAR | Status: AC
  Filled 2020-05-23: qty 1

## 2020-05-24 ENCOUNTER — Encounter (HOSPITAL_COMMUNITY): Payer: Self-pay | Admitting: Neurosurgery

## 2020-05-24 ENCOUNTER — Other Ambulatory Visit: Payer: Self-pay

## 2020-05-24 ENCOUNTER — Telehealth: Payer: Self-pay | Admitting: Internal Medicine

## 2020-05-24 NOTE — Telephone Encounter (Signed)
Pt advised.

## 2020-05-24 NOTE — Telephone Encounter (Signed)
Pt called regarding his surgery tomorrow, Drs office wanted him to give [name] a call to see what he should do about his V-go. Pt wants to know if he should take it off for the surgery and put on a new one after or just leave in on?  Ph# [phone]

## 2020-05-24 NOTE — Telephone Encounter (Signed)
Please advise 

## 2020-05-24 NOTE — Progress Notes (Signed)
Troy [name] denies chest pain or shortness of breath. Patient was tested for Covid and has been in quarantine since that time.  Troy [name] has type II diabetes, patient wears a [employer] continuous reader. Patient reported that Am CBG was 120.  Troy [name] has a VGO Insulin pump that delivers approximately 80 units of Novolog Insulin per day. The VGO pump basal rate cannot be changed. Patient has not had the pump when he has had surgery in the past. I instructed Troy [name] to call Endocrinologist, Dr Cruzita Lederer and ask what he should do regarding pump.  I instructed patient to not take any medications for diabetes in am. I instructed patient to check CBG after awaking and every 2 hours until arrival  to the hospital.  I Instructed patient to check CBG in am and every 2 hours until he leaves to come to the hospital if CBG is less than 70 to drink 1/2 cup clear juice. Recheck CBG in 15 minutes if CBG is not over 70 call, pre- op desk at [phone] for further instructions.  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 Troy [name] last dose of Eliquis was 05/20/20. Patient's insurance is not paying for Eliquis any longer. PCP is working on a different anticoagulant. LAst DVT was 2020.

## 2020-05-24 NOTE — Telephone Encounter (Signed)
T,  It would probably be safest if he took it off for the surgery and restart it afterwards.

## 2020-05-25 ENCOUNTER — Ambulatory Visit (HOSPITAL_COMMUNITY): Payer: BC Managed Care – PPO

## 2020-05-25 ENCOUNTER — Ambulatory Visit (HOSPITAL_COMMUNITY)
Admission: RE | Admit: 2020-05-25 | Discharge: 2020-05-26 | Disposition: A | Discharge status: Home / Self Care | Payer: BC Managed Care – PPO | Attending: Neurosurgery | Admitting: Neurosurgery

## 2020-05-25 ENCOUNTER — Encounter (HOSPITAL_COMMUNITY): Payer: Self-pay | Admitting: Neurosurgery

## 2020-05-25 ENCOUNTER — Ambulatory Visit (HOSPITAL_COMMUNITY)
Admission: RE | Disposition: A | Discharge status: Home / Self Care | Payer: Self-pay | Source: Home / Self Care | Attending: Neurosurgery

## 2020-05-25 DIAGNOSIS — Z86718 Personal history of other venous thrombosis and embolism: Secondary | ICD-10-CM | POA: Diagnosis not present

## 2020-05-25 DIAGNOSIS — Z8349 Family history of other endocrine, nutritional and metabolic diseases: Secondary | ICD-10-CM | POA: Insufficient documentation

## 2020-05-25 DIAGNOSIS — Z7901 Long term (current) use of anticoagulants: Secondary | ICD-10-CM | POA: Insufficient documentation

## 2020-05-25 DIAGNOSIS — Z981 Arthrodesis status: Secondary | ICD-10-CM | POA: Diagnosis not present

## 2020-05-25 DIAGNOSIS — Z79899 Other long term (current) drug therapy: Secondary | ICD-10-CM | POA: Diagnosis not present

## 2020-05-25 DIAGNOSIS — Z841 Family history of disorders of kidney and ureter: Secondary | ICD-10-CM | POA: Insufficient documentation

## 2020-05-25 DIAGNOSIS — Z419 Encounter for procedure for purposes other than remedying health state, unspecified: Secondary | ICD-10-CM

## 2020-05-25 DIAGNOSIS — M4322 Fusion of spine, cervical region: Secondary | ICD-10-CM | POA: Diagnosis not present

## 2020-05-25 DIAGNOSIS — E119 Type 2 diabetes mellitus without complications: Secondary | ICD-10-CM | POA: Diagnosis not present

## 2020-05-25 DIAGNOSIS — G4733 Obstructive sleep apnea (adult) (pediatric): Secondary | ICD-10-CM | POA: Diagnosis not present

## 2020-05-25 DIAGNOSIS — Z833 Family history of diabetes mellitus: Secondary | ICD-10-CM | POA: Insufficient documentation

## 2020-05-25 DIAGNOSIS — M4802 Spinal stenosis, cervical region: Secondary | ICD-10-CM | POA: Diagnosis not present

## 2020-05-25 DIAGNOSIS — M4722 Other spondylosis with radiculopathy, cervical region: Secondary | ICD-10-CM | POA: Insufficient documentation

## 2020-05-25 DIAGNOSIS — Z8249 Family history of ischemic heart disease and other diseases of the circulatory system: Secondary | ICD-10-CM | POA: Diagnosis not present

## 2020-05-25 DIAGNOSIS — E785 Hyperlipidemia, unspecified: Secondary | ICD-10-CM | POA: Diagnosis not present

## 2020-05-25 DIAGNOSIS — Z794 Long term (current) use of insulin: Secondary | ICD-10-CM | POA: Diagnosis not present

## 2020-05-25 DIAGNOSIS — Z836 Family history of other diseases of the respiratory system: Secondary | ICD-10-CM | POA: Diagnosis not present

## 2020-05-25 DIAGNOSIS — E559 Vitamin D deficiency, unspecified: Secondary | ICD-10-CM | POA: Diagnosis not present

## 2020-05-25 HISTORY — DX: Other complications of anesthesia, initial encounter: T88.59XA

## 2020-05-25 HISTORY — PX: ANTERIOR CERVICAL DECOMP/DISCECTOMY FUSION: SHX1161

## 2020-05-25 HISTORY — DX: Polyneuropathy, unspecified: G62.9

## 2020-05-25 LAB — GLUCOSE, CAPILLARY
Glucose-Capillary: 146 mg/dL — ABNORMAL HIGH (ref 70–99)
Glucose-Capillary: 138 mg/dL — ABNORMAL HIGH (ref 70–99)
Glucose-Capillary: 146 mg/dL — ABNORMAL HIGH (ref 70–99)
Glucose-Capillary: 251 mg/dL — ABNORMAL HIGH (ref 70–99)

## 2020-05-25 LAB — BASIC METABOLIC PANEL
Anion gap: 8 (ref 5–15)
BUN: 14 mg/dL (ref 6–20)
CO2: 23 mmol/L (ref 22–32)
Calcium: 8.7 mg/dL — ABNORMAL LOW (ref 8.9–10.3)
Chloride: 107 mmol/L (ref 98–111)
Creatinine, Ser: 0.67 mg/dL (ref 0.61–1.24)
GFR, Estimated: >60 mL/min (ref >60)
Glucose, Bld: 145 mg/dL — ABNORMAL HIGH (ref 70–99)
Potassium: 3.8 mmol/L (ref 3.5–5.1)
Sodium: 138 mmol/L (ref 135–145)

## 2020-05-25 LAB — CBC
HCT: 48.3 % (ref 39.0–52.0)
Hemoglobin: 16.5 g/dL (ref 13.0–17.0)
MCH: 32.6 pg (ref 26.0–34.0)
MCHC: 34.2 g/dL (ref 30.0–36.0)
MCV: 95.5 fL (ref 80.0–100.0)
Platelets: 208 10*3/uL (ref 150–400)
RBC: 5.06 MIL/uL (ref 4.22–5.81)
RDW: 12.3 % (ref 11.5–15.5)
WBC: 10.3 10*3/uL (ref 4.0–10.5)
nRBC: 0 % (ref 0.0–0.2)

## 2020-05-25 LAB — HEMOGLOBIN A1C
Hgb A1c MFr Bld: 6.7 % — ABNORMAL HIGH (ref 4.8–5.6)
Mean Plasma Glucose: 145.59 mg/dL

## 2020-05-25 LAB — TYPE AND SCREEN
ABO/RH(D): A POS
Antibody Screen: NEGATIVE

## 2020-05-25 LAB — ABO/RH: ABO/RH(D): A POS

## 2020-05-25 LAB — SURGICAL PCR SCREEN
MRSA, PCR: NEGATIVE
Staphylococcus aureus: NEGATIVE

## 2020-05-25 SURGERY — ANTERIOR CERVICAL DECOMPRESSION/DISCECTOMY FUSION 1 LEVEL
Anesthesia: General

## 2020-05-25 MED ORDER — ACETAMINOPHEN 325 MG PO TABS
650.0000 mg | ORAL_TABLET | ORAL | Status: DC | PRN
  Administered 2020-05-26: 650 mg via ORAL
  Filled 2020-05-25: qty 2

## 2020-05-25 MED ORDER — CANAGLIFLOZIN 100 MG PO TABS
100.0000 mg | ORAL_TABLET | Freq: Every day | ORAL | Status: DC
  Administered 2020-05-26: 100 mg via ORAL
  Filled 2020-05-25: qty 1

## 2020-05-25 MED ORDER — CHLORHEXIDINE GLUCONATE 0.12 % MT SOLN
OROMUCOSAL | Status: AC
  Administered 2020-05-25: 15 mL via OROMUCOSAL
  Filled 2020-05-25: qty 15

## 2020-05-25 MED ORDER — PANTOPRAZOLE SODIUM 40 MG IV SOLR
40.0000 mg | Freq: Every day | INTRAVENOUS | Status: DC
  Administered 2020-05-25: 40 mg via INTRAVENOUS
  Filled 2020-05-25: qty 40

## 2020-05-25 MED ORDER — DEXTROSE 5 % IV SOLN
3.0000 g | INTRAVENOUS | Status: AC
  Administered 2020-05-25: 3 g via INTRAVENOUS
  Filled 2020-05-25: qty 3

## 2020-05-25 MED ORDER — BUPIVACAINE-EPINEPHRINE (PF) 0.25% -1:200000 IJ SOLN
INTRAMUSCULAR | Status: AC
  Filled 2020-05-25: qty 10

## 2020-05-25 MED ORDER — 0.9 % SODIUM CHLORIDE (POUR BTL) OPTIME
TOPICAL | Status: DC | PRN
  Administered 2020-05-25: 1000 mL

## 2020-05-25 MED ORDER — CHLORHEXIDINE GLUCONATE CLOTH 2 % EX PADS: 6.0000 | MEDICATED_PAD | Freq: Once | CUTANEOUS | Status: DC

## 2020-05-25 MED ORDER — ACETAMINOPHEN 650 MG RE SUPP: 650.0000 mg | RECTAL | Status: DC | PRN

## 2020-05-25 MED ORDER — DEXAMETHASONE SODIUM PHOSPHATE 10 MG/ML IJ SOLN
INTRAMUSCULAR | Status: AC
  Filled 2020-05-25: qty 1

## 2020-05-25 MED ORDER — PHENOL 1.4 % MT LIQD: 1.0000 | OROMUCOSAL | Status: DC | PRN

## 2020-05-25 MED ORDER — MIDAZOLAM HCL 2 MG/2ML IJ SOLN
INTRAMUSCULAR | Status: DC | PRN
  Administered 2020-05-25: 2 mg via INTRAVENOUS

## 2020-05-25 MED ORDER — MORPHINE SULFATE (PF) 4 MG/ML IV SOLN
4.0000 mg | INTRAVENOUS | Status: DC | PRN
Start: 2020-05-25 — End: 2020-05-26

## 2020-05-25 MED ORDER — ACETAMINOPHEN 500 MG PO TABS
1000.0000 mg | ORAL_TABLET | Freq: Once | ORAL | Status: AC
  Administered 2020-05-25: 1000 mg via ORAL
  Filled 2020-05-25: qty 2

## 2020-05-25 MED ORDER — OXYCODONE HCL 5 MG PO TABS
5.0000 mg | ORAL_TABLET | ORAL | Status: DC | PRN
  Administered 2020-05-26: 5 mg via ORAL

## 2020-05-25 MED ORDER — INSULIN ASPART 100 UNIT/ML ~~LOC~~ SOLN
0.0000 [IU] | SUBCUTANEOUS | Status: DC
  Administered 2020-05-26: 10 [IU] via SUBCUTANEOUS

## 2020-05-25 MED ORDER — CYCLOBENZAPRINE HCL 10 MG PO TABS
10.0000 mg | ORAL_TABLET | Freq: Three times a day (TID) | ORAL | Status: DC | PRN
Start: 2020-05-25 — End: 2020-05-26
  Administered 2020-05-25 – 2020-05-26 (×3): 10 mg via ORAL
  Filled 2020-05-25 (×2): qty 1

## 2020-05-25 MED ORDER — ONDANSETRON HCL 4 MG/2ML IJ SOLN
INTRAMUSCULAR | Status: DC | PRN
  Administered 2020-05-25: 4 mg via INTRAVENOUS

## 2020-05-25 MED ORDER — ONDANSETRON HCL 4 MG/2ML IJ SOLN
INTRAMUSCULAR | Status: AC
  Filled 2020-05-25: qty 2

## 2020-05-25 MED ORDER — BACITRACIN ZINC 500 UNIT/GM EX OINT
TOPICAL_OINTMENT | CUTANEOUS | Status: AC
  Filled 2020-05-25: qty 28.35

## 2020-05-25 MED ORDER — ALUM & MAG HYDROXIDE-SIMETH 200-200-20 MG/5ML PO SUSP
30.0000 mL | Freq: Four times a day (QID) | ORAL | Status: DC | PRN
Start: 2020-05-25 — End: 2020-05-26

## 2020-05-25 MED ORDER — ALBUTEROL SULFATE HFA 108 (90 BASE) MCG/ACT IN AERS
1.0000 | INHALATION_SPRAY | Freq: Four times a day (QID) | RESPIRATORY_TRACT | Status: DC | PRN
  Filled 2020-05-25: qty 6.7

## 2020-05-25 MED ORDER — DOCUSATE SODIUM 100 MG PO CAPS
100.0000 mg | ORAL_CAPSULE | Freq: Two times a day (BID) | ORAL | Status: DC
  Administered 2020-05-26: 100 mg via ORAL
  Filled 2020-05-25 (×2): qty 1

## 2020-05-25 MED ORDER — CYCLOBENZAPRINE HCL 10 MG PO TABS
ORAL_TABLET | ORAL | Status: AC
  Filled 2020-05-25: qty 1

## 2020-05-25 MED ORDER — SUGAMMADEX SODIUM 200 MG/2ML IV SOLN
INTRAVENOUS | Status: DC | PRN
  Administered 2020-05-25: 200 mg via INTRAVENOUS

## 2020-05-25 MED ORDER — CELECOXIB 200 MG PO CAPS
200.0000 mg | ORAL_CAPSULE | Freq: Once | ORAL | Status: AC
  Administered 2020-05-25: 200 mg via ORAL
  Filled 2020-05-25: qty 1

## 2020-05-25 MED ORDER — AMISULPRIDE (ANTIEMETIC) 5 MG/2ML IV SOLN: 10.0000 mg | Freq: Once | INTRAVENOUS | Status: DC | PRN

## 2020-05-25 MED ORDER — ONDANSETRON HCL 4 MG PO TABS: 4.0000 mg | ORAL_TABLET | Freq: Four times a day (QID) | ORAL | Status: DC | PRN

## 2020-05-25 MED ORDER — FENTANYL CITRATE (PF) 250 MCG/5ML IJ SOLN
INTRAMUSCULAR | Status: AC
  Filled 2020-05-25: qty 5

## 2020-05-25 MED ORDER — CHLORHEXIDINE GLUCONATE 0.12 % MT SOLN: 15.0000 mL | Freq: Once | OROMUCOSAL | Status: AC

## 2020-05-25 MED ORDER — PREGABALIN 75 MG PO CAPS
75.0000 mg | ORAL_CAPSULE | Freq: Two times a day (BID) | ORAL | Status: DC
  Administered 2020-05-26: 75 mg via ORAL
  Filled 2020-05-25 (×2): qty 1

## 2020-05-25 MED ORDER — ROCURONIUM BROMIDE 10 MG/ML (PF) SYRINGE
PREFILLED_SYRINGE | INTRAVENOUS | Status: AC
  Filled 2020-05-25: qty 30

## 2020-05-25 MED ORDER — VITAMIN D 25 MCG (1000 UNIT) PO TABS
1000.0000 [IU] | ORAL_TABLET | Freq: Every day | ORAL | Status: DC
  Administered 2020-05-26: 1000 [IU] via ORAL
  Filled 2020-05-25: qty 1

## 2020-05-25 MED ORDER — BUPIVACAINE-EPINEPHRINE (PF) 0.25% -1:200000 IJ SOLN
INTRAMUSCULAR | Status: DC | PRN
  Administered 2020-05-25: 10 mL

## 2020-05-25 MED ORDER — LIDOCAINE 2% (20 MG/ML) 5 ML SYRINGE
INTRAMUSCULAR | Status: AC
  Filled 2020-05-25: qty 10

## 2020-05-25 MED ORDER — LACTATED RINGERS IV SOLN: INTRAVENOUS | Status: DC

## 2020-05-25 MED ORDER — OXYCODONE HCL 5 MG PO TABS
10.0000 mg | ORAL_TABLET | ORAL | Status: DC | PRN
Start: 2020-05-25 — End: 2020-05-26
  Administered 2020-05-26: 10 mg via ORAL
  Filled 2020-05-25 (×2): qty 2

## 2020-05-25 MED ORDER — DEXAMETHASONE 4 MG PO TABS: 4.0000 mg | ORAL_TABLET | Freq: Four times a day (QID) | ORAL | Status: AC

## 2020-05-25 MED ORDER — THROMBIN 5000 UNITS EX SOLR
OROMUCOSAL | Status: DC | PRN
  Administered 2020-05-25: 5 mL via TOPICAL

## 2020-05-25 MED ORDER — DEXAMETHASONE SODIUM PHOSPHATE 10 MG/ML IJ SOLN
INTRAMUSCULAR | Status: DC | PRN
  Administered 2020-05-25: 10 mg via INTRAVENOUS

## 2020-05-25 MED ORDER — CEFAZOLIN SODIUM-DEXTROSE 2-4 GM/100ML-% IV SOLN
2.0000 g | Freq: Three times a day (TID) | INTRAVENOUS | Status: AC
  Administered 2020-05-25 – 2020-05-26 (×2): 2 g via INTRAVENOUS
  Filled 2020-05-25 (×2): qty 100

## 2020-05-25 MED ORDER — LIDOCAINE 2% (20 MG/ML) 5 ML SYRINGE
INTRAMUSCULAR | Status: DC | PRN
  Administered 2020-05-25: 100 mg via INTRAVENOUS

## 2020-05-25 MED ORDER — PHENYLEPHRINE HCL-NACL 10-0.9 MG/250ML-% IV SOLN
INTRAVENOUS | Status: DC | PRN
  Administered 2020-05-25: 25 ug/min via INTRAVENOUS

## 2020-05-25 MED ORDER — ATORVASTATIN CALCIUM 40 MG PO TABS
40.0000 mg | ORAL_TABLET | Freq: Every day | ORAL | Status: DC
  Administered 2020-05-26: 40 mg via ORAL
  Filled 2020-05-25: qty 1

## 2020-05-25 MED ORDER — METFORMIN HCL ER 500 MG PO TB24
2000.0000 mg | ORAL_TABLET | Freq: Every day | ORAL | Status: DC
  Administered 2020-05-26: 2000 mg via ORAL
  Filled 2020-05-25: qty 4

## 2020-05-25 MED ORDER — ONDANSETRON HCL 4 MG/2ML IJ SOLN: 4.0000 mg | Freq: Four times a day (QID) | INTRAMUSCULAR | Status: DC | PRN

## 2020-05-25 MED ORDER — ORAL CARE MOUTH RINSE: 15.0000 mL | Freq: Once | OROMUCOSAL | Status: AC

## 2020-05-25 MED ORDER — MIDAZOLAM HCL 2 MG/2ML IJ SOLN
INTRAMUSCULAR | Status: AC
  Filled 2020-05-25: qty 2

## 2020-05-25 MED ORDER — FENTANYL CITRATE (PF) 100 MCG/2ML IJ SOLN: 25.0000 ug | INTRAMUSCULAR | Status: DC | PRN

## 2020-05-25 MED ORDER — MENTHOL 3 MG MT LOZG
1.0000 | LOZENGE | OROMUCOSAL | Status: DC | PRN
  Filled 2020-05-25: qty 9

## 2020-05-25 MED ORDER — DEXAMETHASONE SODIUM PHOSPHATE 4 MG/ML IJ SOLN
4.0000 mg | Freq: Four times a day (QID) | INTRAMUSCULAR | Status: AC
  Administered 2020-05-25 – 2020-05-26 (×2): 4 mg via INTRAVENOUS
  Filled 2020-05-25 (×2): qty 1

## 2020-05-25 MED ORDER — BISACODYL 10 MG RE SUPP: 10.0000 mg | Freq: Every day | RECTAL | Status: DC | PRN

## 2020-05-25 MED ORDER — ROCURONIUM BROMIDE 10 MG/ML (PF) SYRINGE
PREFILLED_SYRINGE | INTRAVENOUS | Status: DC | PRN
  Administered 2020-05-25: 20 mg via INTRAVENOUS
  Administered 2020-05-25: 100 mg via INTRAVENOUS
  Administered 2020-05-25: 50 mg via INTRAVENOUS

## 2020-05-25 MED ORDER — BACITRACIN ZINC 500 UNIT/GM EX OINT
TOPICAL_OINTMENT | CUTANEOUS | Status: DC | PRN
  Administered 2020-05-25: 1 via TOPICAL

## 2020-05-25 MED ORDER — ACETAMINOPHEN 500 MG PO TABS
1000.0000 mg | ORAL_TABLET | Freq: Four times a day (QID) | ORAL | Status: DC
  Administered 2020-05-25 – 2020-05-26 (×2): 1000 mg via ORAL
  Filled 2020-05-25 (×2): qty 2

## 2020-05-25 MED ORDER — FENTANYL CITRATE (PF) 100 MCG/2ML IJ SOLN
INTRAMUSCULAR | Status: DC | PRN
  Administered 2020-05-25: 100 ug via INTRAVENOUS

## 2020-05-25 MED ORDER — THROMBIN 5000 UNITS EX KIT
PACK | CUTANEOUS | Status: AC
  Filled 2020-05-25: qty 1

## 2020-05-25 MED ORDER — PROPOFOL 10 MG/ML IV BOLUS
INTRAVENOUS | Status: DC | PRN
  Administered 2020-05-25: 150 mg via INTRAVENOUS
  Administered 2020-05-25: 50 mg via INTRAVENOUS

## 2020-05-25 SURGICAL SUPPLY — 53 items
BAND RUBBER #18 3X1/16 STRL (MISCELLANEOUS) IMPLANT
BENZOIN TINCTURE PRP APPL 2/3 (GAUZE/BANDAGES/DRESSINGS) ×2 IMPLANT
BIT DRILL NEURO 2X3.1 SFT TUCH (MISCELLANEOUS) ×1 IMPLANT
BLADE SURG 15 STRL LF DISP TIS (BLADE) ×1 IMPLANT
BLADE SURG 15 STRL SS (BLADE) ×2
BLADE ULTRA TIP 2M (BLADE) ×2 IMPLANT
BUR BARREL STRAIGHT FLUTE 4.0 (BURR) ×2 IMPLANT
BUR MATCHSTICK NEURO 3.0 LAGG (BURR) ×2 IMPLANT
CANISTER SUCT 3000ML PPV (MISCELLANEOUS) ×2 IMPLANT
CARTRIDGE OIL MAESTRO DRILL (MISCELLANEOUS) ×1 IMPLANT
CLOSURE STERI-STRIP 1/4X4 (GAUZE/BANDAGES/DRESSINGS) ×2 IMPLANT
COVER MAYO STAND STRL (DRAPES) ×2 IMPLANT
COVER WAND RF STERILE (DRAPES) ×2 IMPLANT
DECANTER SPIKE VIAL GLASS SM (MISCELLANEOUS) ×2 IMPLANT
DIFFUSER DRILL AIR PNEUMATIC (MISCELLANEOUS) ×2 IMPLANT
DRAPE LAPAROTOMY 100X72 PEDS (DRAPES) ×2 IMPLANT
DRAPE MICROSCOPE LEICA (MISCELLANEOUS) IMPLANT
DRAPE SURG 17X23 STRL (DRAPES) ×4 IMPLANT
DRILL NEURO 2X3.1 SOFT TOUCH (MISCELLANEOUS) ×2
DRSG OPSITE POSTOP 3X4 (GAUZE/BANDAGES/DRESSINGS) ×2 IMPLANT
ELECT REM PT RETURN 9FT ADLT (ELECTROSURGICAL) ×2
ELECTRODE REM PT RTRN 9FT ADLT (ELECTROSURGICAL) ×1 IMPLANT
GAUZE 4X4 16PLY RFD (DISPOSABLE) IMPLANT
GLOVE BIO SURGEON STRL SZ8 (GLOVE) ×2 IMPLANT
GLOVE BIO SURGEON STRL SZ8.5 (GLOVE) ×2 IMPLANT
GLOVE EXAM NITRILE XL STR (GLOVE) IMPLANT
GOWN STRL REUS W/ TWL LRG LVL3 (GOWN DISPOSABLE) IMPLANT
GOWN STRL REUS W/ TWL XL LVL3 (GOWN DISPOSABLE) ×1 IMPLANT
GOWN STRL REUS W/TWL LRG LVL3 (GOWN DISPOSABLE)
GOWN STRL REUS W/TWL XL LVL3 (GOWN DISPOSABLE) ×2
HEMOSTAT POWDER KIT SURGIFOAM (HEMOSTASIS) ×2 IMPLANT
KIT BASIN OR (CUSTOM PROCEDURE TRAY) ×2 IMPLANT
KIT TURNOVER KIT B (KITS) ×2 IMPLANT
MARKER SKIN DUAL TIP RULER LAB (MISCELLANEOUS) ×2 IMPLANT
NEEDLE HYPO 22GX1.5 SAFETY (NEEDLE) ×2 IMPLANT
NEEDLE SPNL 18GX3.5 QUINCKE PK (NEEDLE) ×2 IMPLANT
NS IRRIG 1000ML POUR BTL (IV SOLUTION) ×2 IMPLANT
OIL CARTRIDGE MAESTRO DRILL (MISCELLANEOUS) ×2
PACK LAMINECTOMY NEURO (CUSTOM PROCEDURE TRAY) ×2 IMPLANT
PEEK VISTA 14X14X8MM (Peek) ×2 IMPLANT
PIN DISTRACTION 14MM (PIN) ×4 IMPLANT
PLATE ANT CERV XTEND 1 LV 14 (Plate) ×2 IMPLANT
PUTTY DBM 2CC CALC GRAN (Putty) ×2 IMPLANT
SCREW XTD VAR 4.2 SELF TAP (Screw) ×8 IMPLANT
SPONGE INTESTINAL PEANUT (DISPOSABLE) ×4 IMPLANT
SPONGE SURGIFOAM ABS GEL SZ50 (HEMOSTASIS) IMPLANT
STRIP CLOSURE SKIN 1/2X4 (GAUZE/BANDAGES/DRESSINGS) ×2 IMPLANT
SUT VIC AB 0 CT1 27 (SUTURE) ×2
SUT VIC AB 0 CT1 27XBRD ANTBC (SUTURE) ×1 IMPLANT
SUT VIC AB 3-0 SH 8-18 (SUTURE) ×2 IMPLANT
TOWEL GREEN STERILE (TOWEL DISPOSABLE) ×2 IMPLANT
TOWEL GREEN STERILE FF (TOWEL DISPOSABLE) ×2 IMPLANT
WATER STERILE IRR 1000ML POUR (IV SOLUTION) ×2 IMPLANT

## 2020-05-25 NOTE — Anesthesia Postprocedure Evaluation (Signed)
Anesthesia Post Note  Patient: Troy [name]  Procedure(s) Performed: ANTERIOR CERVICAL DECOMPRESSION/DISCECTOMY FUSION, INTERBODY PROSTHESIS, PLATE/SCREWS CERVICAL FIVE- CERVICAL SIX (N/A )     Patient location during evaluation: PACU Anesthesia Type: General Level of consciousness: sedated Pain management: pain level controlled Vital Signs Assessment: post-procedure vital signs reviewed and stable Respiratory status: spontaneous breathing and respiratory function stable Cardiovascular status: stable Postop Assessment: no apparent nausea or vomiting Anesthetic complications: no   No complications documented.  Last Vitals:  Vitals:   05/25/20 1945 05/25/20 2011  BP: 128/80 132/77  Pulse: 90 89  Resp: 13 20  Temp:  36.7 C  SpO2: 99% 99%    Last Pain:  Vitals:   05/25/20 2011  TempSrc: Oral  PainSc:                  Jared Whorley DANIEL

## 2020-05-25 NOTE — Transfer of Care (Signed)
Immediate Anesthesia Transfer of Care Note  Patient: Troy [name]  Procedure(s) Performed: ANTERIOR CERVICAL DECOMPRESSION/DISCECTOMY FUSION, INTERBODY PROSTHESIS, PLATE/SCREWS CERVICAL FIVE- CERVICAL SIX (N/A )  Patient Location: PACU  Anesthesia Type:General  Level of Consciousness: awake and oriented  Airway & Oxygen Therapy: Patient Spontanous Breathing  Post-op Assessment: Report given to RN and Patient moving all extremities X 4  Post vital signs: Reviewed and stable  Last Vitals:  Vitals Value Taken Time  BP    Temp    Pulse    Resp    SpO2      Last Pain:  Vitals:   05/25/20 1305  PainSc: 5          Complications: No complications documented.

## 2020-05-25 NOTE — Progress Notes (Signed)
Orthopedic Tech Progress Note Patient Details:  Troy [name] [birth year] [id number] Dropped off [place] CERVICAL COLLAR Patient ID: Troy [name], male   DOB: 21-Feb-1971, 50 y.o.   MRN: [id number]   Troy [name] 05/25/2020, 6:21 PM

## 2020-05-25 NOTE — Op Note (Signed)
Brief history: The patient is a 50 year old white male who has complained of neck and right arm pain consistent with a cervical radiculopathy.  He has failed medical management.  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  I discussed the various treatment options with him.  He has decided proceed with surgery.  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 Earle Gell  Asst.: Dr. Deatra Ina and Arnetha Massy, NP  Anesthesia: Gen. endotracheal  Estimated blood loss: 75 cc  Drains: None  Complications: None  Description of procedure: The patient was brought to the operating room by the anesthesia team. General endotracheal anesthesia was induced. A roll was placed under the patient's shoulders to keep the neck in the neutral position. The patient's anterior cervical region was then prepared with Betadine scrub and Betadine solution. Sterile drapes were applied.  The area to be incised was then injected with Marcaine with epinephrine solution. I then used a scalpel to make a transverse incision in the patient's left anterior neck. I used the Metzenbaum scissors to dissect through the scar tissue and to divide the platysmal muscle and then to dissect medial to the sternocleidomastoid muscle, jugular vein, and carotid artery. I carefully dissected down towards the anterior cervical spine identifying the esophagus and retracting it medially. Then using Kitner swabs to clear soft tissue from the anterior cervical spine. We then inserted a  bent spinal needle into the upper exposed intervertebral disc space. We then obtained intraoperative radiographs confirm our location.  I then used electrocautery to detach the medial border of the longus colli muscle bilaterally from the C5-6 intervertebral disc spaces. I then inserted the Caspar self-retaining retractor underneath the longus colli muscle bilaterally to provide exposure.  We then incised the intervertebral disc at C5-6. We then performed a partial intervertebral discectomy with a pituitary forceps and the Karlin curettes. I then inserted distraction screws into the vertebral bodies at C5-6. We then distracted the interspace. We then used the high-speed drill to decorticate the vertebral endplates at C5 and C6, to drill away the remainder of the intervertebral disc, to drill away some posterior spondylosis, and to thin out the posterior longitudinal ligament. I then incised ligament with the arachnoid knife. We then removed the ligament with a Kerrison punches undercutting the vertebral endplates and decompressing the thecal sac. We then performed foraminotomies about the bilateral C6 nerve roots. This completed the decompression at this level.  We now turned our to attention to the interbody fusion. We used the trial spacers to determine the appropriate size for the interbody prosthesis. We then pre-filled prosthesis with a combination of local morcellized autograft bone that we obtained during decompression as well as Zimmer DBM. We then inserted the prosthesis into the distracted interspace at C5-6. We then removed the distraction screws. There was a good snug fit of the prosthesis in the interspace.  Having completed the fusion we now turned attention to the anterior spinal instrumentation. We used the high-speed drill to drill away some anterior spondylosis at the disc spaces so that the plate lay down flat. We selected the appropriate length titanium anterior cervical plate. We laid it  along the anterior  aspect of the vertebral bodies from C5-6. We then drilled 14 mm holes at C5 and C6. We then secured the plate to the vertebral bodies by placing two 14 mm self-tapping screws at C5 and C6. We then obtained intraoperative radiograph.  There was limited visualization of the instrumentation because of the patient's shoulders but it looked good in vivo.  We therefore secured the screws the plate the locking each cam. This completed the instrumentation.  We then obtained hemostasis using bipolar electrocautery. We irrigated the wound out with bacitracin solution. We then removed the retractor. We inspected the esophagus for any damage. There was none apparent. We then reapproximated patient's platysmal muscle with interrupted 3-0 Vicryl suture. We then reapproximated the subcutaneous tissue with interrupted 3-0 Vicryl suture. The skin was reapproximated with Steri-Strips and benzoin. The wound was then covered with bacitracin ointment. A sterile dressing was applied. The drapes were removed. Patient was subsequently extubated by the anesthesia team and transported to the post anesthesia care unit in stable condition. All sponge instrument and needle counts were reportedly correct at the end of this case.

## 2020-05-25 NOTE — H&P (Signed)
Subjective: The patient is a 50 year old white male who is complained of neck and arm pain consistent with a C6 radiculopathy.  He has failed medical management.  He was worked up with a cervical MRI which demonstrated spondylosis and stenosis at C5-6.  I discussed the various treatment options with him.  He decided to proceed with surgery.  Past Medical History:  Diagnosis Date  . Arm weakness    right   . Asthma   . B12 deficiency   . Back pain   . Complication of anesthesia    "as a teenager , as they were waken up, I was shaking all over- they called it convulsions -it has never happen again"  . 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 Diabetes mellitus without complication ([place])    TYPE II  . DVT (deep venous thrombosis) ([name])    right leg last time 2020  . Leg edema, right   . Male infertility   . Neck injury 12/22/2013  . Neuropathy   . Obesity   . Obesity   . OSA (obstructive sleep apnea)    CPAP    Past Surgical History:  Procedure Laterality Date  . BACK SURGERY    . CERVICAL DISC SURGERY     C6/C7 2009  . cubital tunnel left arm     2003  . ELBOW SURGERY    . FIBULA FRACTURE SURGERY     plate & pin removed due to infection 1997  . ULNAR NERVE REPAIR      No Known Allergies  Social History   Tobacco Use  . Smoking status: Never Smoker  . Smokeless tobacco: Never Used  Substance Use Topics  . Alcohol use: Yes    Comment: 1 - 2 a year    Family History  Problem Relation Age of Onset  . Heart disease Mother   . Other Mother        clotting disorders  . Diabetes Mother   . High blood pressure Mother   . Kidney disease Mother   . Depression Mother   . Sleep apnea Mother   . Obesity Mother   . Other Father        sudden death/cervical and lumbar disk disease  . Aneurysm Father        In his 20s smoked  . Hyperlipidemia Father   . High blood pressure Father   . Sudden death Father   . Alcoholism Father   . Hypertension Other   . Allergies Other   . Deep  vein thrombosis Other   . Sleep apnea Other   . Obesity Other    Prior to Admission medications   Medication Sig Start Date End Date Taking? Authorizing Provider  apixaban (ELIQUIS) 5 MG TABS tablet TAKE 1 TABLET(5 MG) BY MOUTH TWICE DAILY Patient taking differently: Take 5 mg by mouth daily. 08/07/19  Yes [name], [name], MD  atorvastatin (LIPITOR) 40 MG tablet TAKE 1 TABLET(40 MG) BY MOUTH DAILY Patient taking differently: Take 40 mg by mouth daily. 02/08/20  Yes [name], [name], MD  cholecalciferol (VITAMIN D3) 25 MCG (1000 UNIT) tablet Take 1,000 Units by mouth daily.   Yes [provider]  Cyanocobalamin (B-12) 5000 MCG CAPS Take 5,000 mcg by mouth daily.   Yes [provider]  HUMALOG 100 UNIT/ML injection INJECT UP TO 80 UNITS DAILY IN VGO. PLEASE MAKE APPOINTMENT 11/17/19  Yes Philemon Kingdom, MD  INVOKANA 100 MG TABS tablet TAKE 1 TABLET DAILY BEFORE BREAKFAST  Patient taking differently: Take 100 mg by mouth daily. 05/20/19  Yes Philemon Kingdom, MD  metFORMIN (GLUCOPHAGE-XR) 500 MG 24 hr tablet TAKE 4 TABLETS DAILY WITH BREAKFAST Patient taking differently: Take 2,000 mg by mouth daily. 02/15/20  Yes Philemon Kingdom, MD  pregabalin (LYRICA) 75 MG capsule Take 1 capsule (75 mg total) by mouth 2 (two) times daily as needed. Nerve pain 11/27/19  Yes Gregor Hams, MD  Semaglutide, 1 MG/DOSE, (OZEMPIC, 1 MG/DOSE,) 2 MG/1.5ML SOPN INJECT 1 MG UNDER THE SKIN ONCE A WEEK Patient taking differently: Inject 1 mg as directed every Sunday. 11/25/19  Yes Philemon Kingdom, MD  albuterol (PROVENTIL HFA;VENTOLIN HFA) 108 (90 Base) MCG/ACT inhaler Inhale 2 puffs into the lungs every 6 (six) hours as needed. For wheezing 07/22/17   [name], [name], MD  Continuous Blood Gluc Sensor (FREESTYLE LIBRE 14 DAY SENSOR) MISC CHANGE EVERY 14 DAYS AS DIRECTED, CHANGE EVERY 2 WEEKS 04/27/20   Philemon Kingdom, MD  Insulin Disposable Pump (V-GO 20) KIT Use 1x a day 11/30/19   Philemon Kingdom, MD      Review of Systems  Positive ROS: As above  All other systems have been reviewed and were otherwise negative with the exception of those mentioned in the HPI and as above.  Objective: Vital signs in last 24 hours: Temp:  [98.1 F (36.7 C)] 98.1 F (36.7 C) (03/16 1212) Pulse Rate:  [76] 76 (03/16 1212) Resp:  [18] 18 (03/16 1212) BP: (138)/(73) 138/73 (03/16 1212) SpO2:  [100 %] 100 % (03/16 1212) Weight:  [122.9 kg] 122.9 kg (03/16 1212) Estimated body mass index is 38.88 kg/m as calculated from the following:   Height as of this encounter: '5\' 10"'  (1.778 m).   Weight as of this encounter: 122.9 kg.   General Appearance: Alert Head: Normocephalic, without obvious abnormality, atraumatic Eyes: PERRL, conjunctiva/corneas clear, EOM's intact,    Ears: Normal  Throat: Normal  Neck: Positive Spurling's testing Back: unremarkable Lungs: Clear to auscultation bilaterally, respirations unlabored Heart: Regular rate and rhythm, no murmur, rub or gallop Abdomen: Soft, non-tender Extremities: Extremities normal, atraumatic, no cyanosis or edema Skin: unremarkable  NEUROLOGIC:   Mental status: alert and oriented,Motor Exam - grossly normal Sensory Exam - grossly normal Reflexes:  Coordination - grossly normal Gait - grossly normal Balance - grossly normal Cranial Nerves: I: smell Not tested  II: visual acuity  OS: Normal  OD: Normal   II: visual fields Full to confrontation  II: pupils Equal, round, reactive to light  III,VII: ptosis None  III,IV,VI: extraocular muscles  Full ROM  V: mastication Normal  V: facial light touch sensation  Normal  V,VII: corneal reflex  Present  VII: facial muscle function - upper  Normal  VII: facial muscle function - lower Normal  VIII: hearing Not tested  IX: soft palate elevation  Normal  IX,X: gag reflex Present  XI: trapezius strength  5/5  XI: sternocleidomastoid strength 5/5  XI: neck flexion strength  5/5  XII: tongue  strength  Normal    Data Review Lab Results  Component Value Date   WBC 10.3 05/25/2020   HGB 16.5 05/25/2020   HCT 48.3 05/25/2020   MCV 95.5 05/25/2020   PLT 208 05/25/2020   Lab Results  Component Value Date   NA 138 05/25/2020   K 3.8 05/25/2020   CL 107 05/25/2020   CO2 23 05/25/2020   BUN 14 05/25/2020   CREATININE 0.67 05/25/2020   GLUCOSE 145 (H) 05/25/2020  No results found for: INR, PROTIME  Assessment/Plan: C5-6 spondylosis, cervicalgia, cervical radiculopathy: I have discussed the situation with the patient.  I have reviewed his imaging studies with him and pointed out the abnormalities.  We have discussed the various treatment options including surgery.  I have described the surgical treatment option of a C5-6 anterior cervical discectomy, fusion and plating.  I have shown him surgical models.  I have given him a surgical pamphlet.  We have discussed the risk, benefits, alternatives, expected postoperative course, and likelihood of achieving our goals with surgery.  I have answered all his questions.  He has decided to proceed with surgery.   Ophelia Charter 05/25/2020 2:32 PM

## 2020-05-25 NOTE — Anesthesia Preprocedure Evaluation (Addendum)
Anesthesia Evaluation  Patient identified by MRN, date of birth, ID band Patient awake    Reviewed: Allergy & Precautions, NPO status , Patient's Chart, lab work & pertinent test results  History of Anesthesia Complications Negative for: history of anesthetic complications  Airway Mallampati: III  TM Distance: >3 FB Neck ROM: Limited    Dental no notable dental hx. (+) Dental Advisory Given   Pulmonary sleep apnea ,    Pulmonary exam normal        Cardiovascular + DVT  Normal cardiovascular exam     Neuro/Psych  Neuromuscular disease    GI/Hepatic negative GI ROS, Neg liver ROS,   Endo/Other  diabetesMorbid obesity  Renal/GU negative Renal ROS     Musculoskeletal negative musculoskeletal ROS (+)   Abdominal   Peds  Hematology negative hematology ROS (+)   Anesthesia Other Findings   Reproductive/Obstetrics                            Anesthesia Physical Anesthesia Plan  ASA: III  Anesthesia Plan: General   Post-op Pain Management:    Induction: Intravenous  PONV Risk Score and Plan: 3 and Ondansetron, Dexamethasone and Midazolam  Airway Management Planned: Oral ETT  Additional Equipment:   Intra-op Plan:   Post-operative Plan: Extubation in OR  Informed Consent: I have reviewed the patients History and Physical, chart, labs and discussed the procedure including the risks, benefits and alternatives for the proposed anesthesia with the patient or authorized representative who has indicated his/her understanding and acceptance.     Dental advisory given  Plan Discussed with: Anesthesiologist and CRNA  Anesthesia Plan Comments:        Anesthesia Quick Evaluation

## 2020-05-25 NOTE — Anesthesia Procedure Notes (Signed)
Procedure Name: Intubation Date/Time: 05/25/2020 3:27 PM Performed by: Barrington Ellison, CRNA Pre-anesthesia Checklist: Patient identified, Emergency Drugs available, Suction available and Patient being monitored Patient Re-evaluated:Patient Re-evaluated prior to induction Oxygen Delivery Method: Circle system utilized Preoxygenation: Pre-oxygenation with 100% oxygen Induction Type: IV induction Ventilation: Mask ventilation without difficulty, Oral airway inserted - appropriate to patient size and Two handed mask ventilation required Laryngoscope Size: Mac and 4 Grade View: Grade I Tube type: Oral Tube size: 7.5 mm Number of attempts: 1 Airway Equipment and Method: Stylet and Oral airway Placement Confirmation: ETT inserted through vocal cords under direct vision,  positive ETCO2 and breath sounds checked- equal and bilateral Secured at: 22 cm Tube secured with: Tape Dental Injury: Teeth and Oropharynx as per pre-operative assessment

## 2020-05-26 ENCOUNTER — Encounter (HOSPITAL_COMMUNITY): Payer: Self-pay | Admitting: Neurosurgery

## 2020-05-26 DIAGNOSIS — M4722 Other spondylosis with radiculopathy, cervical region: Secondary | ICD-10-CM | POA: Diagnosis not present

## 2020-05-26 DIAGNOSIS — Z833 Family history of diabetes mellitus: Secondary | ICD-10-CM | POA: Diagnosis not present

## 2020-05-26 DIAGNOSIS — Z79899 Other long term (current) drug therapy: Secondary | ICD-10-CM | POA: Diagnosis not present

## 2020-05-26 DIAGNOSIS — Z836 Family history of other diseases of the respiratory system: Secondary | ICD-10-CM | POA: Diagnosis not present

## 2020-05-26 DIAGNOSIS — Z86718 Personal history of other venous thrombosis and embolism: Secondary | ICD-10-CM | POA: Diagnosis not present

## 2020-05-26 DIAGNOSIS — Z841 Family history of disorders of kidney and ureter: Secondary | ICD-10-CM | POA: Diagnosis not present

## 2020-05-26 DIAGNOSIS — Z794 Long term (current) use of insulin: Secondary | ICD-10-CM | POA: Diagnosis not present

## 2020-05-26 DIAGNOSIS — Z8349 Family history of other endocrine, nutritional and metabolic diseases: Secondary | ICD-10-CM | POA: Diagnosis not present

## 2020-05-26 DIAGNOSIS — E119 Type 2 diabetes mellitus without complications: Secondary | ICD-10-CM | POA: Diagnosis not present

## 2020-05-26 DIAGNOSIS — M4802 Spinal stenosis, cervical region: Secondary | ICD-10-CM | POA: Diagnosis not present

## 2020-05-26 DIAGNOSIS — Z8249 Family history of ischemic heart disease and other diseases of the circulatory system: Secondary | ICD-10-CM | POA: Diagnosis not present

## 2020-05-26 DIAGNOSIS — Z7901 Long term (current) use of anticoagulants: Secondary | ICD-10-CM | POA: Diagnosis not present

## 2020-05-26 LAB — GLUCOSE, CAPILLARY: Glucose-Capillary: 161 mg/dL — ABNORMAL HIGH (ref 70–99)

## 2020-05-26 MED ORDER — OXYCODONE-ACETAMINOPHEN 5-325 MG PO TABS: 1.0000 | ORAL_TABLET | ORAL | Status: DC | PRN

## 2020-05-26 MED ORDER — OXYCODONE-ACETAMINOPHEN 5-325 MG PO TABS: 1.0000 | ORAL_TABLET | ORAL | 0 refills | Status: DC | PRN

## 2020-05-26 MED ORDER — DOCUSATE SODIUM 100 MG PO CAPS: 100.0000 mg | ORAL_CAPSULE | Freq: Two times a day (BID) | ORAL | 0 refills | Status: DC

## 2020-05-26 MED FILL — Thrombin For Soln Kit 5000 Unit: CUTANEOUS | Qty: 1 | Status: AC

## 2020-05-26 NOTE — Evaluation (Signed)
f2Occupational Therapy Evaluation Patient Details Name: Troy [name] MRN: [id number] DOB: 1971/01/05 Today's Date: 05/26/2020    History of Present Illness The patient is a 50 year old white male who is complained of neck and arm pain consistent with a C6 radiculopathy. Underwent C5-6 anterior cervical discectomy/decompression; C5-6 interbody arthrodesis with local morcellized autograft bone and Zimmer DBM.   Clinical Impression   Pt was admitted for above diagnosis and procedure. PTA, pt was independent with functional mobility and self care. Deficits are listed below. Currently, pt is functioning near prior level of function.     Follow Up Recommendations  No OT follow up    Equipment Recommendations  None recommended by OT    Recommendations for Other Services       Precautions / Restrictions Precautions Precautions: Cervical Precaution Booklet Issued: Yes (comment) Precaution Comments: education provided Required Braces or Orthoses: Cervical Brace Cervical Brace: Hard collar;For comfort Restrictions Weight Bearing Restrictions: No      Mobility Bed Mobility Overal bed mobility: Modified Independent             General bed mobility comments: educated on log roll    Transfers Overall transfer level: Independent Equipment used: None                  Balance Overall balance assessment: Independent                                         ADL either performed or assessed with clinical judgement   ADL Overall ADL's : Independent                                             Vision Baseline Vision/History: Wears glasses Wears Glasses: At all times Patient Visual Report: No change from baseline       Perception     Praxis      Pertinent Vitals/Pain Pain Assessment: No/denies pain     Hand Dominance Right   Extremity/Trunk Assessment Upper Extremity Assessment Upper Extremity Assessment: Overall WFL  for tasks assessed   Lower Extremity Assessment Lower Extremity Assessment: Overall WFL for tasks assessed   Cervical / Trunk Assessment Cervical / Trunk Assessment: Normal   Communication Communication Communication: No difficulties   Cognition Arousal/Alertness: Awake/alert Behavior During Therapy: WFL for tasks assessed/performed Overall Cognitive Status: Within Functional Limits for tasks assessed                                       Home Living Family/patient expects to be discharged to:: Private residence Living Arrangements: Spouse/significant other Available Help at Discharge: Family;Available 24 hours/day Type of Home: House Home Access: Level entry     Home Layout: Multi-level;Bed/bath upstairs Alternate Level Stairs-Number of Steps: full flight Alternate Level Stairs-Rails: Right Bathroom Shower/Tub: Teacher, early years/pre: Standard Bathroom Accessibility: No   Home Equipment: None          Prior Functioning/Environment Level of Independence: Independent                 OT Problem List: Increased edema;Impaired sensation      OT Treatment/Interventions:      OT Goals(Current goals can be found  in the care plan section) Acute Rehab OT Goals Patient Stated Goal: pt wants to return to work and reading at home OT Goal Formulation: With patient Time For Goal Achievement: 05/26/20 Potential to Achieve Goals: Good  OT Frequency:     Barriers to D/C:  none noted             AM-PAC OT "6 Clicks" Daily Activity     Outcome Measure Help from another person eating meals?: None Help from another person taking care of personal grooming?: None Help from another person toileting, which includes using toliet, bedpan, or urinal?: None Help from another person bathing (including washing, rinsing, drying)?: None Help from another person to put on and taking off regular upper body clothing?: None Help from another person to  put on and taking off regular lower body clothing?: None 6 Click Score: 24   End of Session Equipment Utilized During Treatment: Cervical collar Nurse Communication: Mobility status  Activity Tolerance: Patient tolerated treatment well Patient left: in bed  OT Visit Diagnosis: History of falling (Z91.81);Muscle weakness (generalized) (M62.81);Dizziness and giddiness (R42)                Time: 7616-0737 OT Time Calculation (min): 33 min Charges:  OT General Charges $OT Visit: 1 Visit OT Evaluation $OT Eval Moderate Complexity: 1 Mod OT Treatments $Self Care/Home Management : 8-22 mins  05/26/2020  Rich, OTR/L  Acute Rehabilitation Services  Office:  [place] 05/26/2020, 9:47 AM

## 2020-05-26 NOTE — Discharge Summary (Signed)
Physician Discharge Summary  Patient ID: Troy [name] MRN: [id number] DOB/AGE: Feb 17, 1971 50 y.o.  Admit date: 05/25/2020 Discharge date: 05/26/2020  Admission Diagnoses: Cervical spondylosis, cervical stenosis, cervical radiculopathy, cervicalgia  Discharge Diagnoses: The same Active Problems:   * No active hospital problems. *   Discharged Condition: good  Hospital Course: I performed a C5-6 anterior cervicectomy, fusion and plating on the patient on 05/25/2020.  The surgery went well.  The patient's postoperative course was unremarkable.  On postoperative day #1 he requested discharge home.  He was given written and oral discharge instructions.  All his questions were answered.  Consults: OT, care management Significant Diagnostic Studies: None Treatments: C5-6 anterior cervical discectomy, fusion and plating Discharge Exam: Blood pressure 125/79, pulse 72, temperature 97.9 F (36.6 C), temperature source Oral, resp. rate 18, height _0  (1.778 m), weight 122.9 kg, SpO2 98 %. The patient is alert and pleasant.  He looks well.  His strength is normal.  His dressing is clean and dry.  There is no hematoma or shift.  Disposition: Home  Discharge Instructions    Call MD for:  difficulty breathing, headache or visual disturbances   Complete by: As directed    Call MD for:  extreme fatigue   Complete by: As directed    Call MD for:  hives   Complete by: As directed    Call MD for:  persistant dizziness or light-headedness   Complete by: As directed    Call MD for:  persistant nausea and vomiting   Complete by: As directed    Call MD for:  redness, tenderness, or signs of infection (pain, swelling, redness, odor or green/yellow discharge around incision site)   Complete by: As directed    Call MD for:  severe uncontrolled pain   Complete by: As directed    Call MD for:  temperature >100.4   Complete by: As directed    Diet - low sodium heart healthy   Complete by: As  directed    Discharge instructions   Complete by: As directed    Call [phone] for a followup appointment. Take a stool softener while you are using pain medications.   Driving Restrictions   Complete by: As directed    Do not drive for 2 weeks.   Increase activity slowly   Complete by: As directed    Lifting restrictions   Complete by: As directed    Do not lift more than 5 pounds. No excessive bending or twisting.   May shower / Bathe   Complete by: As directed    Remove the dressing for 3 days after surgery.  You may shower, but leave the incision alone.   Remove dressing in 48 hours   Complete by: As directed      Allergies as of 05/26/2020   No Known Allergies     Medication List    TAKE these medications   albuterol 108 (90 Base) MCG/ACT inhaler Commonly known as: VENTOLIN HFA Inhale 2 puffs into the lungs every 6 (six) hours as needed. For wheezing   apixaban 5 MG Tabs tablet Commonly known as: Eliquis TAKE 1 TABLET(5 MG) BY MOUTH TWICE DAILY What changed:   how much to take  how to take this  when to take this  additional instructions   atorvastatin 40 MG tablet Commonly known as: LIPITOR TAKE 1 TABLET(40 MG) BY MOUTH DAILY What changed:   how much to take  how to take this  when  to take this  additional instructions   B-12 5000 MCG Caps Take 5,000 mcg by mouth daily.   cholecalciferol 25 MCG (1000 UNIT) tablet Commonly known as: VITAMIN D3 Take 1,000 Units by mouth daily.   docusate sodium 100 MG capsule Commonly known as: COLACE Take 1 capsule (100 mg total) by mouth 2 (two) times daily.   FreeStyle Libre 14 Day Sensor Misc CHANGE EVERY 14 DAYS AS DIRECTED, CHANGE EVERY 2 WEEKS   HumaLOG 100 UNIT/ML injection Generic drug: insulin lispro INJECT UP TO 80 UNITS DAILY IN VGO. PLEASE MAKE APPOINTMENT   Invokana 100 MG Tabs tablet Generic drug: canagliflozin TAKE 1 TABLET DAILY BEFORE BREAKFAST What changed:   how much to  take  when to take this   metFORMIN 500 MG 24 hr tablet Commonly known as: GLUCOPHAGE-XR TAKE 4 TABLETS DAILY WITH BREAKFAST What changed: See the new instructions.   oxyCODONE-acetaminophen 5-325 MG tablet Commonly known as: PERCOCET/ROXICET Take 1-2 tablets by mouth every 4 (four) hours as needed for moderate pain.   Ozempic (1 MG/DOSE) 2 MG/1.5ML Sopn Generic drug: Semaglutide (1 MG/DOSE) INJECT 1 MG UNDER THE SKIN ONCE A WEEK What changed:   how much to take  how to take this  when to take this  additional instructions   pregabalin 75 MG capsule Commonly known as: Lyrica Take 1 capsule (75 mg total) by mouth 2 (two) times daily as needed. Nerve pain   V-Go 20 Kit Use 1x a day        Signed: Ophelia Charter 05/26/2020, 8:07 AM

## 2020-05-26 NOTE — Discharge Instructions (Signed)
Wound Care Leave incision open to air. You may shower. Do not scrub directly on incision.  Do not put any creams, lotions, or ointments on incision. Activity Walk each and every day, increasing distance each day. No lifting greater than 5 lbs.  Avoid excessive neck motion. No driving for 2 weeks; may ride as a passenger locally. Wear neck brace at all times except when showering.   Diet Resume your normal diet.  Return to Work Will be discussed at you follow up appointment. Call Your Doctor If Any of These Occur Redness, drainage, or swelling at the wound.  Temperature greater than 101 degrees. Severe pain not relieved by pain medication. Increased difficulty swallowing. Incision starts to come apart. Follow Up Appt Call today for appointment in 2-4 weeks ([phone]) or for problems.  If you have any hardware placed in your spine, you will need an x-ray before your appointment.   

## 2020-05-26 NOTE — Progress Notes (Signed)
Patient is discharged from room 3C11 at this time. Alert and in stable condition. IV site d/c'd and instructions read to patient with understanding verbalized and all questions answered. Left unit via wheelchair with all belongings at side. 

## 2020-06-06 ENCOUNTER — Other Ambulatory Visit: Payer: Self-pay | Admitting: Internal Medicine

## 2020-06-06 DIAGNOSIS — Z794 Long term (current) use of insulin: Secondary | ICD-10-CM

## 2020-06-06 DIAGNOSIS — E11319 Type 2 diabetes mellitus with unspecified diabetic retinopathy without macular edema: Secondary | ICD-10-CM

## 2020-06-10 ENCOUNTER — Ambulatory Visit (INDEPENDENT_AMBULATORY_CARE_PROVIDER_SITE_OTHER): Payer: BC Managed Care – PPO | Admitting: Internal Medicine

## 2020-06-10 ENCOUNTER — Telehealth: Payer: Self-pay

## 2020-06-10 ENCOUNTER — Other Ambulatory Visit: Payer: Self-pay

## 2020-06-10 ENCOUNTER — Encounter: Payer: Self-pay | Admitting: Internal Medicine

## 2020-06-10 VITALS — BP 110/70 | HR 90 | Ht 70.0 in | Wt 267.8 lb

## 2020-06-10 DIAGNOSIS — Z6841 Body Mass Index (BMI) 40.0 and over, adult: Secondary | ICD-10-CM

## 2020-06-10 DIAGNOSIS — E11319 Type 2 diabetes mellitus with unspecified diabetic retinopathy without macular edema: Secondary | ICD-10-CM | POA: Diagnosis not present

## 2020-06-10 DIAGNOSIS — Z794 Long term (current) use of insulin: Secondary | ICD-10-CM | POA: Diagnosis not present

## 2020-06-10 DIAGNOSIS — E785 Hyperlipidemia, unspecified: Secondary | ICD-10-CM | POA: Diagnosis not present

## 2020-06-10 MED ORDER — INSULIN LISPRO 100 UNIT/ML ~~LOC~~ SOLN: SUBCUTANEOUS | 3 refills | Status: DC

## 2020-06-10 MED ORDER — V-GO 30 KIT: PACK | 3 refills | Status: DC

## 2020-06-10 NOTE — Progress Notes (Signed)
Subjective:     Patient ID: Troy [name], male   DOB: 01-10-1971, 50 y.o.   MRN: [id number]  This visit occurred during the SARS-CoV-2 public health emergency.  Safety protocols were in place, including screening questions prior to the visit, additional usage of staff PPE, and extensive cleaning of exam room while observing appropriate contact time as indicated for disinfecting solutions.   HPI [name] is a pleasant 50 y.o. man, returning for management of DM2, dx 2012, insulin-dependent, with complications (DR), uncontrolled.  Last visit 6 months ago.  Interim history: He had epidurals since last OV. He had a cervical decompression surgery (diskectomy, fusion) on 05/25/2020. He was on a liquid diet, then on pureed foods for ~2 weeks. He continues to work with [employer] management clinic, and he feels that he lost approximately 30 pounds since starting to work with them.  Reviewed HbA1c levels: Lab Results  Component Value Date   HGBA1C 6.7 (H) 05/25/2020   HGBA1C 5.9 (A) 11/30/2019   HGBA1C 6.4 (A) 07/27/2019   He is on: - Metformin XR 2000 mg in a.m. - Invokana 100 mg before breakfast - VGo 40 >> 30: 2-3 clicks per meal >> 6 clicks per meal >> OEH21 >> now VGo30 (ran out of [id number]) with 4-6 clicks per meal - Ozempic 0.5 mg weekly-started 06/2017 >> 1 mg weekly He was on Victoza before >> no SEs.   He checks his sugars more than 4 times a day with his CGM:   Previously:   Previously:   Lowest: 60s >> 50s >> 96 Highest: 285 >> 230 >> 220 if forgot to bolus before meals, 300s with steroids   -No CKD: Lab Results  Component Value Date   BUN 14 05/25/2020   BUN 13 12/21/2019   CREATININE 0.67 05/25/2020   CREATININE 0.74 (L) 12/21/2019   No MAU: Lab Results  Component Value Date   MICRALBCREAT 6 02/08/2020   MICRALBCREAT 0.5 12/26/2018   MICRALBCREAT 1.4 07/22/2017   MICRALBCREAT 2.2 12/18/2016   MICRALBCREAT 5.8 10/18/2015   MICRALBCREAT 2.0 03/16/2013    MICRALBCREAT 1.6 11/06/2012   MICRALBCREAT 2.5 07/31/2012   MICRALBCREAT 3.6 11/26/2011   MICRALBCREAT 2.3 11/28/2010  He is not on ACE inhibitor/ARB. -+ HL: Lab Results  Component Value Date   CHOL 131 02/08/2020   HDL 37 (L) 02/08/2020   LDLCALC 74 02/08/2020   LDLDIRECT 125.0 12/26/2018   TRIG 117 02/08/2020   CHOLHDL 3.5 02/08/2020  On Lipitor 20. - Last eye exam was in 04/2019: + DR.  He sees a retina specialist.. -No numbness and tingling in feet - sees podiatry.  On gabapentin.  He has OSA and is compliant with his CPAP. He had a DVT in R leg in 04/2015.  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  He noticed that sugars were higher after he started Eliquis. He lost 45 lbs before 2017 - mostly vegan food.  He has a history of ADD. He did see urology before and was given Viagra, but causes for ED were not explored. We checked his testosterone level.  This returned mildly low: Component     Latest Ref Rng & Units 04/27/2019  Testosterone, Serum (Total)     ng/dL 262 (L)  % Free Testosterone     % 1.9  Free Testosterone, S     pg/mL 50 (L)  Sex Hormone Binding Globulin     nmol/L 34.5  After this,  I referred him to the weight management clinic.  Reviewed B12 levels: Lab Results  Component Value Date   VITAMINB12 237 04/27/2019   VITAMINB12 236 08/19/2017  On B12 1000 mcg daily.  Review of Systems Constitutional: no weight gain/+ weight loss, no fatigue, no subjective hyperthermia, no subjective hypothermia Eyes: no blurry vision, no xerophthalmia ENT: no sore throat, no nodules palpated in neck, no dysphagia, no odynophagia, no hoarseness Cardiovascular: no CP/no SOB/no palpitations/+ chronic right leg swelling Respiratory: no cough/no SOB/no wheezing Gastrointestinal: no N/no V/no D/no C/no acid reflux Musculoskeletal: no muscle aches/+ joint aches Skin: no rashes no hair loss Neurological: + R hand tremors/no numbness/no  tingling/no dizziness  I reviewed pt's medications, allergies, PMH, social hx, family hx, and changes were documented in the history of present illness. Otherwise, unchanged from my initial visit note.  Past Medical History:  Diagnosis Date  . Arm weakness    right   . Asthma   . B12 deficiency   . Back pain   . Complication of anesthesia    "as a teenager , as they were waken up, I was shaking all over- they called it convulsions -it has never happen again"  . Diabetes insipidus ([place])   . Diabetes mellitus without complication ([place])    TYPE II  . DVT (deep venous thrombosis) ([name])    right leg last time 2020  . Leg edema, right   . Male infertility   . Neck injury 12/22/2013  . Neuropathy   . Obesity   . Obesity   . OSA (obstructive sleep apnea)    CPAP   Past Surgical History:  Procedure Laterality Date  . ANTERIOR CERVICAL DECOMP/DISCECTOMY FUSION N/A 05/25/2020   Procedure: ANTERIOR CERVICAL DECOMPRESSION/DISCECTOMY FUSION, INTERBODY PROSTHESIS, PLATE/SCREWS CERVICAL FIVE- CERVICAL SIX;  Surgeon: Newman Pies, MD;  Location: [facility];  Service: Neurosurgery;  Laterality: N/A;  . BACK SURGERY    . CERVICAL DISC SURGERY     C6/C7 2009  . cubital tunnel left arm     2003  . ELBOW SURGERY    . FIBULA FRACTURE SURGERY     plate & pin removed due to infection 1997  . ULNAR NERVE REPAIR     Social History   Socioeconomic History  . Marital status: Married    Spouse name: Troy [name]  . Number of children: 1  . Years of education: Not on file  . Highest education level: Not on file  Occupational History  . Occupation: Therapist, [employer]  Tobacco Use  . Smoking status: Never Smoker  . Smokeless tobacco: Never Used  Vaping Use  . Vaping Use: Never used  Substance and Sexual Activity  . Alcohol use: Yes    Comment: 1 - 2 a year  . Drug use: No  . Sexual activity: Yes  Other Topics Concern  . Not on file  Social History Narrative   Occupation:  days [employer] 11- 8 am  Now changed job and shift to [employer] through Thursday    Working  Ft 40 hours mostly   Married _0 Wife s/p bariatric surgery pt   Regular exercise- yes   Pt does have children   Father died suddenly May 23, 2008   Mom passes 2017  Acute rep disease after acute renal failure   Daily caffeine use one a day   2 dogs and cat.             Social  Determinants of Health   Financial Resource Strain: Not on file  Food Insecurity: Not on file  Transportation Needs: Not on file  Physical Activity: Not on file  Stress: Not on file  Social Connections: Not on file  Intimate Partner Violence: Not on file   Current Outpatient Medications on File Prior to Visit  Medication Sig Dispense Refill  . albuterol (PROVENTIL HFA;VENTOLIN HFA) 108 (90 Base) MCG/ACT inhaler Inhale 2 puffs into the lungs every 6 (six) hours as needed. For wheezing 1 Inhaler 2  . apixaban (ELIQUIS) 5 MG TABS tablet TAKE 1 TABLET(5 MG) BY MOUTH TWICE DAILY (Patient taking differently: Take 5 mg by mouth daily.) 180 tablet 1  . atorvastatin (LIPITOR) 40 MG tablet TAKE 1 TABLET(40 MG) BY MOUTH DAILY (Patient taking differently: Take 40 mg by mouth daily.) 90 tablet 1  . cholecalciferol (VITAMIN D3) 25 MCG (1000 UNIT) tablet Take 1,000 Units by mouth daily.    . Continuous Blood Gluc Sensor (FREESTYLE LIBRE 14 DAY SENSOR) MISC CHANGE EVERY 14 DAYS AS DIRECTED, CHANGE EVERY 2 WEEKS 6 each 0  . Cyanocobalamin (B-12) 5000 MCG CAPS Take 5,000 mcg by mouth daily.    [name] docusate sodium (COLACE) 100 MG capsule Take 1 capsule (100 mg total) by mouth 2 (two) times daily. 60 capsule 0  . HUMALOG 100 UNIT/ML injection INJECT UP TO 80 UNITS DAILY IN VGO. PLEASE MAKE APPOINTMENT 90 mL 3  . Insulin Disposable Pump (V-GO 20) KIT Use 1x a day 90 kit 3  . INVOKANA 100 MG TABS tablet TAKE 1 TABLET DAILY BEFORE BREAKFAST 90 tablet 3  . metFORMIN (GLUCOPHAGE-XR) 500 MG 24 hr tablet TAKE 4 TABLETS DAILY WITH BREAKFAST (Patient  taking differently: Take 2,000 mg by mouth daily.) 360 tablet 3  . oxyCODONE-acetaminophen (PERCOCET/ROXICET) 5-325 MG tablet Take 1-2 tablets by mouth every 4 (four) hours as needed for moderate pain. 30 tablet 0  . pregabalin (LYRICA) 75 MG capsule Take 1 capsule (75 mg total) by mouth 2 (two) times daily as needed. Nerve pain 180 capsule 3  . Semaglutide, 1 MG/DOSE, (OZEMPIC, 1 MG/DOSE,) 2 MG/1.5ML SOPN INJECT 1 MG UNDER THE SKIN ONCE A WEEK (Patient taking differently: Inject 1 mg as directed every Sunday.) 9 mL 2   No current facility-administered medications on file prior to visit.   No Known Allergies Family History  Problem Relation Age of Onset  . Heart disease Mother   . Other Mother        clotting disorders  . Diabetes Mother   . High blood pressure Mother   . Kidney disease Mother   . Depression Mother   . Sleep apnea Mother   . Obesity Mother   . Other Father        sudden death/cervical and lumbar disk disease  . Aneurysm Father        In his 62s smoked  . Hyperlipidemia Father   . High blood pressure Father   . Sudden death Father   . Alcoholism Father   . Hypertension Other   . Allergies Other   . Deep vein thrombosis Other   . Sleep apnea Other   . Obesity Other     Objective:   Physical Exam BP 110/70 (BP Location: Right Arm, Patient Position: Sitting, Cuff Size: Normal)   [name] 90   Ht _0  (1.778 m)   Wt 267 lb 12.8 oz (121.5 kg)   SpO2 97%   BMI 38.43 kg/m  Body mass index  is 38.43 kg/m. Wt Readings from Last 3 Encounters:  06/10/20 267 lb 12.8 oz (121.5 kg)  05/25/20 271 lb (122.9 kg)  05/19/20 279 lb 12.8 oz (126.9 kg)   Constitutional: overweight, in NAD Eyes: PERRLA, EOMI, no exophthalmos ENT: moist mucous membranes, no thyromegaly, no cervical lymphadenopathy Cardiovascular: RRR, No MRG, + RLE swelling -chronic, after his DVT  Respiratory: CTA B Gastrointestinal: abdomen soft, NT, ND, BS+ Musculoskeletal: no deformities, strength  intact in all 4 Skin: moist, warm, no rashes Neurological: + tremor with R outstretched hand, DTR normal in all 4  Assessment:     1. DM2, insulin-dependent, uncontrolled, with complications - DR  2. Obesity class 3 BMI Classification:  < 18.5 underweight   18.5-24.9 normal weight   25.0-29.9 overweight   30.0-34.9 class I obesity   35.0-39.9 class II obesity   ? 40.0 class III obesity   3. HL  4.  Low T  Plan:     1. Patient with history of uncontrolled diabetes, on oral antidiabetic regimen with Metformin, SGLT2 inhibitor, also, weekly GLP-1 receptor agonist and a Vgo patch pump.  He started to work with the weight management clinic and was successful in losing weight.  He also switched from working nights to working days few months before last visit.  His sugars improved significantly before last visit and an HbA1c decreased to 5.9%.  At that time, as she was occasionally dropping his sugars in the night, we changed to [place] and I also advised him to not use Humalog for some of the meals as he was having some low blood sugars especially during the night.  He had a new HbA1c last month and this was higher, at 6.7%.  However, he had cervical decompression surgery on 05/25/2020. -Since last visit, he tells me that he ran out VGo20 and he switched to [place], which she continues now.   CGM interpretation: -At today's visit, we reviewed her CGM downloads: It appears that 83% of values are in target range (goal >70%), while 17% are higher than 180 (goal <25%), and 0% are lower than 70 (goal <4%).  The calculated average blood sugar is 150.  The projected HbA1c for the next 3 months (GMI) is 6.9%. -Reviewing the CGM trends, his sugars are now higher in the target interval, and occasionally above target after dinner.  However, milder hyperglycemic excursions appear after every meal and we discussed about not missing his insulin boluses.  He has had steroid injections and a different diet  recently, after his surgery, so the last 2 weeks are not very representative for him especially if he continues working with the weight management clinic and very well losing weight.  However, for now, since his sugars are higher than before and the projected HbA1c 6.9%, I encouraged him to continue on the same VGo30, but I did advise him that we may need to switch to VGo20 when sugars start to improve after he heals after his surgery. - I advised him to: Patient Instructions  Please continue: - Metformin XR 2000 mg at dinnertime - Invokana 100 mg before b'fast  - Ozempic 1 mg weekly  Also continue: - [id number] for now: 4-6 clicks per meal  Please return in 4 months.   - advised to check sugars at different times of the day - 4x a day, rotating check times - advised for yearly eye exams >> he is not UTD but he was called to schedule another appointment - return to clinic in  4 months   2. Obesity class 3 -We will continue the GLP-1 receptor agonist and SGLT2 inhibitor which should also help with weight loss -He is now in the weight management clinic -He lost 14 pounds before last visit, previously gained 13 pounds -Since last visit, he lost 16 pounds - per his scale, he lost ~30 lbs since starting to work with weight mngm  3. HL -Reviewed latest lipid panel from 01/2020: Fractions at goal with exception of a low HDL Lab Results  Component Value Date   CHOL 131 02/08/2020   HDL 37 (L) 02/08/2020   LDLCALC 74 02/08/2020   LDLDIRECT 125.0 12/26/2018   TRIG 117 02/08/2020   CHOLHDL 3.5 02/08/2020  -He continues Lipitor 40 mg daily without side effects  4. Low testosterone -Patient had a testosterone checked due to complaints of ED and the free testosterone was mildly low -He did see urology in the past -We also discussed about referring him to weight management due to the possibility of obesity in his hypogonadism.  He is now successful in losing the weight  Philemon Kingdom, MD  PhD [facility]

## 2020-06-10 NOTE — Telephone Encounter (Signed)
Inbound call from pharmacy advising PA required for Invokana 100 mg. Pharmacy initiated request and provided key: [id number].

## 2020-06-10 NOTE — Patient Instructions (Addendum)
Please continue: - Metformin XR 2000 mg at dinnertime - Invokana 100 mg before b'fast  - Ozempic 1 mg weekly  Also continue: - [id number] for now: 4-6 clicks per meal  Please return in 4 months.

## 2020-06-11 ENCOUNTER — Encounter: Payer: Self-pay | Admitting: Internal Medicine

## 2020-06-11 DIAGNOSIS — Z794 Long term (current) use of insulin: Secondary | ICD-10-CM

## 2020-06-11 DIAGNOSIS — E11319 Type 2 diabetes mellitus with unspecified diabetic retinopathy without macular edema: Secondary | ICD-10-CM

## 2020-06-13 ENCOUNTER — Other Ambulatory Visit: Payer: Self-pay

## 2020-06-13 ENCOUNTER — Ambulatory Visit (INDEPENDENT_AMBULATORY_CARE_PROVIDER_SITE_OTHER): Payer: BC Managed Care – PPO | Admitting: Adult Health

## 2020-06-13 ENCOUNTER — Encounter (INDEPENDENT_AMBULATORY_CARE_PROVIDER_SITE_OTHER): Payer: Self-pay | Admitting: Adult Health

## 2020-06-13 VITALS — BP 111/73 | HR 91 | Temp 97.7°F | Ht 70.0 in | Wt 262.0 lb

## 2020-06-13 DIAGNOSIS — Z6839 Body mass index (BMI) 39.0-39.9, adult: Secondary | ICD-10-CM

## 2020-06-13 DIAGNOSIS — Z794 Long term (current) use of insulin: Secondary | ICD-10-CM | POA: Diagnosis not present

## 2020-06-13 DIAGNOSIS — E118 Type 2 diabetes mellitus with unspecified complications: Secondary | ICD-10-CM

## 2020-06-13 DIAGNOSIS — Z9189 Other specified personal risk factors, not elsewhere classified: Secondary | ICD-10-CM | POA: Diagnosis not present

## 2020-06-13 DIAGNOSIS — E559 Vitamin D deficiency, unspecified: Secondary | ICD-10-CM

## 2020-06-13 MED ORDER — FREESTYLE LIBRE 14 DAY SENSOR MISC: 1 refills | Status: DC

## 2020-06-13 NOTE — Telephone Encounter (Signed)
Rx sent to preferred pharmacy.

## 2020-06-14 LAB — VITAMIN D 25 HYDROXY (VIT D DEFICIENCY, FRACTURES): Vit D, 25-Hydroxy: 63 ng/mL (ref 30.0–100.0)

## 2020-06-14 NOTE — Progress Notes (Signed)
Chief Complaint:   OBESITY Troy [name] is here to discuss his progress with his obesity treatment plan along with follow-up of his obesity related diagnoses. Troy [name] is on the Category 3 Plan and states he is following his eating plan approximately 10% of the time. Troy [name] states he is dong 0 minutes 0 times per week.  Today's visit was #: 43 Starting weight: 293 lbs Starting date: 09/07/2019 Today's weight: 262 lbs Today's date: 06/13/2020 Total lbs lost to date: 31 lbs Total lbs lost since last in-office visit: 5 lbs  Interim History: 05/15/2020- he underwent anterior cervical decompression/diskectomy fusion. Due to post-op dysphagia, he was unable to consume firm food. He followed soft food diet- shakes, noodles, mashed potatoes, jello, and pudding. He is now able to masticate and swallow all forms of food- has resumed Cat 3 meal plan.   Subjective:   1. Vitamin D deficiency Troy [name]'s Vitamin D level was 56.8 on 12/21/2019. He is currently taking OTC vitamin D 5,000 IU each day. He is also on oral B12 supplement daily.    Ref. Range 12/21/2019 14:33  Vitamin D, 25-Hydroxy Latest Ref Range: 30.0 - 100.0 ng/mL 56.8   2. Type 2 diabetes mellitus with complication, with long-term current use of insulin ([name]) Troy [name]'s 05/25/2020 A1c 6.7. He is on Metformin XR 500 mg 4 tabs with breakfast, Invokana 100 mg QD, Ozempic 1 mg once week, ambulatory fasting BG 109-130.  Lab Results  Component Value Date   HGBA1C 6.7 (H) 05/25/2020   HGBA1C 5.9 (A) 11/30/2019   HGBA1C 6.4 (A) 07/27/2019   Lab Results  Component Value Date   MICROALBUR 0.5 02/08/2020   LDLCALC 74 02/08/2020   CREATININE 0.67 05/25/2020   No results found for: INSULIN  3. At risk for osteoporosis Troy [name] is at higher risk of osteopenia and osteoporosis due to Vitamin D deficiency and obesity.  Assessment/Plan:   1. Vitamin D deficiency Low Vitamin D level contributes to fatigue and are associated with obesity,  breast, and colon cancer. He agrees to continue to take OTC Vitamin D @5 ,000 IU daily and will follow-up for routine testing of Vitamin D, at least 2-3 times per year to avoid over-replacement. Check labs today.  - VITAMIN D 25 Hydroxy (Vit-D Deficiency, Fractures)  2. Type 2 diabetes mellitus with complication, with long-term current use of insulin ([name]) Good blood sugar control is important to decrease the likelihood of diabetic complications such as nephropathy, neuropathy, limb loss, blindness, coronary artery disease, and death. Intensive lifestyle modification including diet, exercise and weight loss are the first line of treatment for diabetes. Continue current anti-diabetic regimen.  3. At risk for osteoporosis Troy [name] was given approximately 15 minutes of osteoporosis prevention counseling today. Troy [name] is at risk for osteopenia and osteoporosis due to his Vitamin D deficiency. He was encouraged to take his Vitamin D and follow his higher calcium diet and increase strengthening exercise to help strengthen his bones and decrease his risk of osteopenia and osteoporosis.  Repetitive spaced learning was employed today to elicit superior memory formation and behavioral change.  4. Current BMI 36.6 Troy [name] is currently in the action stage of change. As such, his goal is to continue with weight loss efforts. He has agreed to the Category 3 Plan.   Exercise goals: No exercise has been prescribed at this time.  Behavioral modification strategies: increasing lean protein intake, meal planning and cooking strategies and planning for success.  Troy [name] has agreed to follow-up with our clinic in 4  weeks. He was informed of the importance of frequent follow-up visits to maximize his success with intensive lifestyle modifications for his multiple health conditions.   Objective:   Blood pressure 111/73, pulse 91, temperature 97.7 F (36.5 C), height 5\' 10"  (1.778 m), weight 262 lb (118.8 kg), SpO2  97 %. Body mass index is 37.59 kg/m.  General: Cooperative, alert, well developed, in no acute distress. HEENT: Conjunctivae and lids unremarkable. Cardiovascular: Regular rhythm.  Lungs: Normal work of breathing. Neurologic: No focal deficits.   Lab Results  Component Value Date   CREATININE 0.67 05/25/2020   BUN 14 05/25/2020   NA 138 05/25/2020   K 3.8 05/25/2020   CL 107 05/25/2020   CO2 23 05/25/2020   Lab Results  Component Value Date   ALT 18 12/21/2019   AST 11 12/21/2019   ALKPHOS 71 12/21/2019   BILITOT 0.5 12/21/2019   Lab Results  Component Value Date   HGBA1C 6.7 (H) 05/25/2020   HGBA1C 5.9 (A) 11/30/2019   HGBA1C 6.4 (A) 07/27/2019   HGBA1C 7.5 (A) 04/27/2019   HGBA1C 6.7 (A) 12/26/2018   No results found for: INSULIN Lab Results  Component Value Date   TSH 1.32 04/27/2019   Lab Results  Component Value Date   CHOL 131 02/08/2020   HDL 37 (L) 02/08/2020   LDLCALC 74 02/08/2020   LDLDIRECT 125.0 12/26/2018   TRIG 117 02/08/2020   CHOLHDL 3.5 02/08/2020   Lab Results  Component Value Date   WBC 10.3 05/25/2020   HGB 16.5 05/25/2020   HCT 48.3 05/25/2020   MCV 95.5 05/25/2020   PLT 208 05/25/2020   Lab Results  Component Value Date   IRON 144 02/08/2020   TIBC 358 02/08/2020   FERRITIN 96 02/08/2020     Attestation Statements:   Reviewed by clinician on day of visit: allergies, medications, problem list, medical history, surgical history, family history, social history, and previous encounter notes.  Coral Ceo, am acting as Location manager for Mina Marble, NP.  I have reviewed the above documentation for accuracy and completeness, and I agree with the above. -  Smantha Boakye d. Laelynn Blizzard, NP-C

## 2020-06-16 ENCOUNTER — Other Ambulatory Visit: Payer: Self-pay | Admitting: Internal Medicine

## 2020-06-16 DIAGNOSIS — E11319 Type 2 diabetes mellitus with unspecified diabetic retinopathy without macular edema: Secondary | ICD-10-CM

## 2020-06-16 DIAGNOSIS — Z794 Long term (current) use of insulin: Secondary | ICD-10-CM

## 2020-06-21 DIAGNOSIS — M4722 Other spondylosis with radiculopathy, cervical region: Secondary | ICD-10-CM | POA: Diagnosis not present

## 2020-07-12 ENCOUNTER — Ambulatory Visit (INDEPENDENT_AMBULATORY_CARE_PROVIDER_SITE_OTHER): Payer: BC Managed Care – PPO | Admitting: Adult Health

## 2020-07-12 ENCOUNTER — Other Ambulatory Visit: Payer: Self-pay

## 2020-07-12 ENCOUNTER — Encounter (INDEPENDENT_AMBULATORY_CARE_PROVIDER_SITE_OTHER): Payer: Self-pay | Admitting: Adult Health

## 2020-07-12 VITALS — BP 108/69 | HR 80 | Temp 97.7°F | Ht 70.0 in | Wt 268.0 lb

## 2020-07-12 DIAGNOSIS — E118 Type 2 diabetes mellitus with unspecified complications: Secondary | ICD-10-CM

## 2020-07-12 DIAGNOSIS — Z794 Long term (current) use of insulin: Secondary | ICD-10-CM

## 2020-07-12 DIAGNOSIS — Z6838 Body mass index (BMI) 38.0-38.9, adult: Secondary | ICD-10-CM

## 2020-07-12 DIAGNOSIS — G4733 Obstructive sleep apnea (adult) (pediatric): Secondary | ICD-10-CM | POA: Diagnosis not present

## 2020-07-12 DIAGNOSIS — R6884 Jaw pain: Secondary | ICD-10-CM | POA: Diagnosis not present

## 2020-07-13 DIAGNOSIS — R6884 Jaw pain: Secondary | ICD-10-CM | POA: Insufficient documentation

## 2020-07-13 NOTE — Progress Notes (Signed)
Chief Complaint:   OBESITY Troy [name] is here to discuss his progress with his obesity treatment plan along with follow-up of his obesity related diagnoses. Troy [name] is on the Category 3 Plan and states he is following his eating plan approximately 40% of the time. Troy [name] states he is not currently exercising.  Today's visit was #: 18 Starting weight: 293 lbs Starting date: 09/07/2019 Today's weight: 268 lbs Today's date: 07/12/2020 Total lbs lost to date: 25 Total lbs lost since last in-office visit: 0  Interim History: 05/25/2020 ACDF- diskectomy fusion. 06/10/2020  Tosh was seen by Dr. Cruzita Lederer at endocrinology. The only change to diabetic regimen was to increase Vgo Dose from 20 to 30 and follow up in 4 months. Of Note: Right mandible pain- pain at rest 3/10 and with movement/mastication 8/10. Pain described as throbbing. Troy [name] denies acute injury to area. He denies recent sinus infection or dental infection. He denies this happening before. Sx's present for 3 weeks. Also he has had difficulty falling asleep the last 3 weeks.  Subjective:   1. Mandible pain Right mandible pain- pain at rest 3/10 and with movement/mastication 8/10. Pain described as throbbing. Troy [name] denies acute injury to area. He denies recent sinus infection or dental infection. He denies this happening before. Sx's present for 3 weeks.  2. Type 2 diabetes mellitus with complication, with long-term current use of insulin ([place]) On 06/10/2020, Troy [name] was seen by Dr. Cruzita Lederer at Endocrinology. VGo dose was increased from 20 to 30 mg. He reports ambulatory BG 110-120. Pt denies episodes of hypoglycemia.   Lab Results  Component Value Date   HGBA1C 6.7 (H) 05/25/2020   HGBA1C 5.9 (A) 11/30/2019   HGBA1C 6.4 (A) 07/27/2019   Lab Results  Component Value Date   MICROALBUR 0.5 02/08/2020   LDLCALC 74 02/08/2020   CREATININE 0.67 05/25/2020   No results found for: INSULIN  Assessment/Plan:   1. Mandible  pain Follow up with neurology regarding R mandible pain- PCP, if needed.  2. Type 2 diabetes mellitus with complication, with long-term current use of insulin ([name]) Good blood sugar control is important to decrease the likelihood of diabetic complications such as nephropathy, neuropathy, limb loss, blindness, coronary artery disease, and death. Intensive lifestyle modification including diet, exercise and weight loss are the first line of treatment for diabetes. Continue current anti-diabetic regimen. Follow up with Endo in 4 months.  3. Class 2 severe obesity with serious comorbidity and body mass index (BMI) of 38.0 to 38.9 in adult, unspecified obesity type [facility]) Troy [name] is currently in the action stage of change. As such, his goal is to continue with weight loss efforts. He has agreed to the Category 3 Plan.   1. Follow up with neurology regarding R mandible pain- PCP, if needed. 2. OTC melatonin 3 mg QHS.  Exercise goals: No exercise has been prescribed at this time.  Behavioral modification strategies: increasing lean protein intake, decreasing simple carbohydrates, no skipping meals, meal planning and cooking strategies and planning for success.  [name] has agreed to follow-up with our clinic in 4 weeks. He was informed of the importance of frequent follow-up visits to maximize his success with intensive lifestyle modifications for his multiple health conditions.   Objective:   Blood pressure 108/69, pulse 80, temperature 97.7 F (36.5 C), height 5\' 10"  (1.778 m), weight 268 lb (121.6 kg), SpO2 97 %. Body mass index is 38.45 kg/m.  General: Cooperative, alert, well developed, in no acute distress. HEENT: Conjunctivae and  lids unremarkable. Cardiovascular: Regular rhythm.  Lungs: Normal work of breathing. Neurologic: No focal deficits.   Lab Results  Component Value Date   CREATININE 0.67 05/25/2020   BUN 14 05/25/2020   NA 138 05/25/2020   K 3.8 05/25/2020   CL 107  05/25/2020   CO2 23 05/25/2020   Lab Results  Component Value Date   ALT 18 12/21/2019   AST 11 12/21/2019   ALKPHOS 71 12/21/2019   BILITOT 0.5 12/21/2019   Lab Results  Component Value Date   HGBA1C 6.7 (H) 05/25/2020   HGBA1C 5.9 (A) 11/30/2019   HGBA1C 6.4 (A) 07/27/2019   HGBA1C 7.5 (A) 04/27/2019   HGBA1C 6.7 (A) 12/26/2018   No results found for: INSULIN Lab Results  Component Value Date   TSH 1.32 04/27/2019   Lab Results  Component Value Date   CHOL 131 02/08/2020   HDL 37 (L) 02/08/2020   LDLCALC 74 02/08/2020   LDLDIRECT 125.0 12/26/2018   TRIG 117 02/08/2020   CHOLHDL 3.5 02/08/2020   Lab Results  Component Value Date   WBC 10.3 05/25/2020   HGB 16.5 05/25/2020   HCT 48.3 05/25/2020   MCV 95.5 05/25/2020   PLT 208 05/25/2020   Lab Results  Component Value Date   IRON 144 02/08/2020   TIBC 358 02/08/2020   FERRITIN 96 02/08/2020    Attestation Statements:   Reviewed by clinician on day of visit: allergies, medications, problem list, medical history, surgical history, family history, social history, and previous encounter notes.  Time spent on visit including pre-visit chart review and post-visit care and charting was 33 minutes.   Coral Ceo, am acting as Location manager for Mina Marble, NP.  I have reviewed the above documentation for accuracy and completeness, and I agree with the above. -  Jhony Antrim d. Ladaija Dimino, NP-C

## 2020-07-20 ENCOUNTER — Telehealth: Payer: Self-pay

## 2020-07-20 NOTE — Telephone Encounter (Signed)
I initiated a prior authorization for the patient for Eliquis 5mg . The prior authorization was sent to plan and has been approved by [employer]. I left a message for the patient informing him his prior authorization has been approved and to contact his pharmacy.   Key: [id number] CaseId:[id number] Status:Approved;Review Type:Prior Auth;Coverage Start Date:06/20/2020;Coverage End Date:07/20/2021;

## 2020-08-15 ENCOUNTER — Ambulatory Visit (INDEPENDENT_AMBULATORY_CARE_PROVIDER_SITE_OTHER): Payer: BC Managed Care – PPO | Admitting: Adult Health

## 2020-08-15 ENCOUNTER — Other Ambulatory Visit: Payer: Self-pay

## 2020-08-15 ENCOUNTER — Encounter (INDEPENDENT_AMBULATORY_CARE_PROVIDER_SITE_OTHER): Payer: Self-pay | Admitting: Adult Health

## 2020-08-15 VITALS — BP 108/72 | HR 78 | Temp 97.6°F | Ht 70.0 in | Wt 268.0 lb

## 2020-08-15 DIAGNOSIS — E118 Type 2 diabetes mellitus with unspecified complications: Secondary | ICD-10-CM

## 2020-08-15 DIAGNOSIS — E559 Vitamin D deficiency, unspecified: Secondary | ICD-10-CM | POA: Diagnosis not present

## 2020-08-15 DIAGNOSIS — Z794 Long term (current) use of insulin: Secondary | ICD-10-CM | POA: Diagnosis not present

## 2020-08-15 DIAGNOSIS — Z6838 Body mass index (BMI) 38.0-38.9, adult: Secondary | ICD-10-CM

## 2020-08-17 NOTE — Progress Notes (Signed)
Chief Complaint:   OBESITY Troy [name] is here to discuss his progress with his obesity treatment plan along with follow-up of his obesity related diagnoses. Troy [name] is on the Category 3 Plan and states he is following his eating plan approximately 70% of the time. Troy [name] states he is not currently exercising.  Today's visit was #: 96 Starting weight: 293 lbs Starting date: 09/07/2019 Today's weight: 268 lbs Today's date: 08/15/2020 Total lbs lost to date: 25 Total lbs lost since last in-office visit: 0  Interim History: Troy [name] celebrated his 50th birthday! He celebrated his niece's graduation and enjoyed Memorial Day holiday.  He focused on protein intake at meals during parties. He also strictly followed Category 3 during "non-celebration days".  Subjective:   1. Type 2 diabetes mellitus with complication, with long-term current use of insulin ([name]) Ambulatory fasting BG 110's. 05/25/2020 A1c 6.7- at goal. Troy [name] is on multiple anti-diabetic medications, managed by endocrinologist, Dr. Cruzita Lederer.  Lab Results  Component Value Date   HGBA1C 6.7 (H) 05/25/2020   HGBA1C 5.9 (A) 11/30/2019   HGBA1C 6.4 (A) 07/27/2019   Lab Results  Component Value Date   MICROALBUR 0.5 02/08/2020   LDLCALC 74 02/08/2020   CREATININE 0.67 05/25/2020   No results found for: INSULIN  2. Vitamin D deficiency Troy [name]'s Vitamin D level was 63 on 06/13/2020- at goal. He is currently taking OTC vitamin D 1,000 IU each day. He denies nausea, vomiting or muscle weakness.  Assessment/Plan:   1. Type 2 diabetes mellitus with complication, with long-term current use of insulin ([name]) Good blood sugar control is important to decrease the likelihood of diabetic complications such as nephropathy, neuropathy, limb loss, blindness, coronary artery disease, and death. Intensive lifestyle modification including diet, exercise and weight loss are the first line of treatment for diabetes.  -Check fasting labs at  next OV. -Continue with endocrinology.  2. Vitamin D deficiency Low Vitamin D level contributes to fatigue and are associated with obesity, breast, and colon cancer. He agrees to continue to take OTC Vitamin D @1 ,000 IU daily and will follow-up for routine testing of Vitamin D, at least 2-3 times per year to avoid over-replacement.  3. Class 2 severe obesity with serious comorbidity and body mass index (BMI) of 38.0 to 38.9 in adult, unspecified obesity type Troy [name])  Troy [name] is currently in the action stage of change. As such, his goal is to continue with weight loss efforts. He has agreed to the Category 3 Plan and keeping a food journal and adhering to recommended goals of 250-350 calories and 25 g protein with breakfast.   -Check fasting labs at next OV.  Exercise goals: Walk at least 10 minutes 3 times a week.  Behavioral modification strategies: increasing lean protein intake, decreasing simple carbohydrates, meal planning and cooking strategies, keeping healthy foods in the home, celebration eating strategies and planning for success.  Troy [name] has agreed to follow-up with our clinic in 3 weeks. He was informed of the importance of frequent follow-up visits to maximize his success with intensive lifestyle modifications for his multiple health conditions.   Objective:   Blood pressure 108/72, pulse 78, temperature 97.6 F (36.4 C), height 5\' 10"  (1.778 m), weight 268 lb (121.6 kg), SpO2 98 %. Body mass index is 38.45 kg/m.  General: Cooperative, alert, well developed, in no acute distress. HEENT: Conjunctivae and lids unremarkable. Cardiovascular: Regular rhythm.  Lungs: Normal work of breathing. Neurologic: No focal deficits.   Lab Results  Component Value Date  CREATININE 0.67 05/25/2020   BUN 14 05/25/2020   NA 138 05/25/2020   K 3.8 05/25/2020   CL 107 05/25/2020   CO2 23 05/25/2020   Lab Results  Component Value Date   ALT 18 12/21/2019   AST 11 12/21/2019    ALKPHOS 71 12/21/2019   BILITOT 0.5 12/21/2019   Lab Results  Component Value Date   HGBA1C 6.7 (H) 05/25/2020   HGBA1C 5.9 (A) 11/30/2019   HGBA1C 6.4 (A) 07/27/2019   HGBA1C 7.5 (A) 04/27/2019   HGBA1C 6.7 (A) 12/26/2018   No results found for: INSULIN Lab Results  Component Value Date   TSH 1.32 04/27/2019   Lab Results  Component Value Date   CHOL 131 02/08/2020   HDL 37 (L) 02/08/2020   LDLCALC 74 02/08/2020   LDLDIRECT 125.0 12/26/2018   TRIG 117 02/08/2020   CHOLHDL 3.5 02/08/2020   Lab Results  Component Value Date   WBC 10.3 05/25/2020   HGB 16.5 05/25/2020   HCT 48.3 05/25/2020   MCV 95.5 05/25/2020   PLT 208 05/25/2020   Lab Results  Component Value Date   IRON 144 02/08/2020   TIBC 358 02/08/2020   FERRITIN 96 02/08/2020     Attestation Statements:   Reviewed by clinician on day of visit: allergies, medications, problem list, medical history, surgical history, family history, social history, and previous encounter notes.  Time spent on visit including pre-visit chart review and post-visit care and charting was 30 minutes.   Coral Ceo, CMA, am acting as transcriptionist for Mina Marble, NP.  I have reviewed the above documentation for accuracy and completeness, and I agree with the above. -  Corsica Franson d. Tukker Byrns, NP-C

## 2020-09-06 ENCOUNTER — Ambulatory Visit (INDEPENDENT_AMBULATORY_CARE_PROVIDER_SITE_OTHER): Payer: BC Managed Care – PPO | Admitting: Adult Health

## 2020-09-06 ENCOUNTER — Encounter (INDEPENDENT_AMBULATORY_CARE_PROVIDER_SITE_OTHER): Payer: Self-pay | Admitting: Adult Health

## 2020-09-06 ENCOUNTER — Other Ambulatory Visit: Payer: Self-pay

## 2020-09-06 VITALS — BP 99/67 | HR 62 | Temp 97.6°F | Ht 70.0 in | Wt 268.0 lb

## 2020-09-06 DIAGNOSIS — E1169 Type 2 diabetes mellitus with other specified complication: Secondary | ICD-10-CM | POA: Diagnosis not present

## 2020-09-06 DIAGNOSIS — E118 Type 2 diabetes mellitus with unspecified complications: Secondary | ICD-10-CM | POA: Diagnosis not present

## 2020-09-06 DIAGNOSIS — Z6839 Body mass index (BMI) 39.0-39.9, adult: Secondary | ICD-10-CM

## 2020-09-06 DIAGNOSIS — E559 Vitamin D deficiency, unspecified: Secondary | ICD-10-CM

## 2020-09-06 DIAGNOSIS — Z9189 Other specified personal risk factors, not elsewhere classified: Secondary | ICD-10-CM | POA: Diagnosis not present

## 2020-09-06 DIAGNOSIS — E785 Hyperlipidemia, unspecified: Secondary | ICD-10-CM | POA: Diagnosis not present

## 2020-09-06 DIAGNOSIS — Z794 Long term (current) use of insulin: Secondary | ICD-10-CM

## 2020-09-07 DIAGNOSIS — G4733 Obstructive sleep apnea (adult) (pediatric): Secondary | ICD-10-CM | POA: Diagnosis not present

## 2020-09-07 LAB — VITAMIN B12: Vitamin B-12: >2000 pg/mL — ABNORMAL HIGH (ref 232–1245)

## 2020-09-07 LAB — COMPREHENSIVE METABOLIC PANEL
ALT: 35 IU/L (ref 0–44)
AST: 23 IU/L (ref 0–40)
Albumin/Globulin Ratio: 2.1 (ref 1.2–2.2)
Albumin: 5 g/dL (ref 4.0–5.0)
Alkaline Phosphatase: 75 IU/L (ref 44–121)
BUN/Creatinine Ratio: 15 (ref 9–20)
BUN: 13 mg/dL (ref 6–24)
Bilirubin Total: 0.5 mg/dL (ref 0.0–1.2)
CO2: 21 mmol/L (ref 20–29)
Calcium: 9.5 mg/dL (ref 8.7–10.2)
Chloride: 96 mmol/L (ref 96–106)
Creatinine, Ser: 0.84 mg/dL (ref 0.76–1.27)
Globulin, Total: 2.4 g/dL (ref 1.5–4.5)
Glucose: 161 mg/dL — ABNORMAL HIGH (ref 65–99)
Potassium: 4.5 mmol/L (ref 3.5–5.2)
Sodium: 139 mmol/L (ref 134–144)
Total Protein: 7.4 g/dL (ref 6.0–8.5)
eGFR: 106 mL/min/{1.73_m2} (ref >59)

## 2020-09-07 LAB — VITAMIN D 25 HYDROXY (VIT D DEFICIENCY, FRACTURES): Vit D, 25-Hydroxy: 55.3 ng/mL (ref 30.0–100.0)

## 2020-09-07 LAB — LIPID PANEL
Chol/HDL Ratio: 4.4 ratio (ref 0.0–5.0)
Cholesterol, Total: 160 mg/dL (ref 100–199)
HDL: 36 mg/dL — ABNORMAL LOW (ref >39)
LDL Chol Calc (NIH): 87 mg/dL (ref 0–99)
Triglycerides: 220 mg/dL — ABNORMAL HIGH (ref 0–149)
VLDL Cholesterol Cal: 37 mg/dL (ref 5–40)

## 2020-09-07 LAB — HEMOGLOBIN A1C
Est. average glucose Bld gHb Est-mCnc: 189 mg/dL
Hgb A1c MFr Bld: 8.2 % — ABNORMAL HIGH (ref 4.8–5.6)

## 2020-09-07 LAB — INSULIN, RANDOM: INSULIN: 7.9 u[IU]/mL (ref 2.6–24.9)

## 2020-09-07 NOTE — Progress Notes (Signed)
Chief Complaint:   OBESITY Troy [name] is here to discuss his progress with his obesity treatment plan along with follow-up of his obesity related diagnoses. Troy [name] is on the Category 3 Plan and keeping a food journal and adhering to recommended goals of 250-350 calories and 25 g protein at breakfast and states he is following his eating plan approximately 60% of the time. Troy [name] states he is not currently exercising.  Today's visit was #: 20 Starting weight: 293 lbs Starting date: 09/07/2019 Today's weight: 268 lbs Today's date: 09/06/2020 Total lbs lost to date: 25 Total lbs lost since last in-office visit: 0  Interim History: Troy [name]'s grandmother recently turned 6, younger sister 71, son turns 36, and wife turns 19- lots of parties. We discussed celebration eating strategies. He maintained his weight since his last OV.  Subjective:   1. Type 2 diabetes mellitus with complication, with long-term current use of insulin (Troy [name]) 05/25/2020 A1c 6.7.  Troy [name]'s ambulatory fasting BG's run 120-130. He denies episodes of hypoglycemia. He is on multiple anti-diabetic prescriptions, managed by Troy [name] at endocrinology.  Lab Results  Component Value Date   HGBA1C 8.2 (H) 09/06/2020   HGBA1C 6.7 (H) 05/25/2020   HGBA1C 5.9 (A) 11/30/2019   Lab Results  Component Value Date   MICROALBUR 0.5 02/08/2020   LDLCALC 87 09/06/2020   CREATININE 0.84 09/06/2020   Lab Results  Component Value Date   INSULIN 7.9 09/06/2020   2. Hyperlipidemia associated with type 2 diabetes mellitus (Troy [name]) Troy [name] is on atorvastatin 40 mg, managed by PCP. He denies myalgias.  Lab Results  Component Value Date   ALT 35 09/06/2020   AST 23 09/06/2020   ALKPHOS 75 09/06/2020   BILITOT 0.5 09/06/2020   Lab Results  Component Value Date   CHOL 160 09/06/2020   HDL 36 (L) 09/06/2020   LDLCALC 87 09/06/2020   LDLDIRECT 125.0 12/26/2018   TRIG 220 (H) 09/06/2020   CHOLHDL 4.4 09/06/2020   3. Vitamin  D deficiency He is currently taking OTC vitamin D 1,000 IU each day. He denies nausea, vomiting or muscle weakness.  Lab Results  Component Value Date   VD25OH 55.3 09/06/2020   VD25OH 63.0 06/13/2020   VD25OH 56.8 12/21/2019   4. At risk for heart disease Troy [name] is at a higher than average risk for cardiovascular disease due to obesity, hyperlipidemia, and type 2 diabetes.  Assessment/Plan:   1. Type 2 diabetes mellitus with complication, with long-term current use of insulin ([name]) Good blood sugar control is important to decrease the likelihood of diabetic complications such as nephropathy, neuropathy, limb loss, blindness, coronary artery disease, and death. Intensive lifestyle modification including diet, exercise and weight loss are the first line of treatment for diabetes.  Check labs today.  - Comprehensive metabolic panel - Hemoglobin A1c - Insulin, random - Vitamin B12  2. Hyperlipidemia associated with type 2 diabetes mellitus ([place]) Cardiovascular risk and specific lipid/LDL goals reviewed.  We discussed several lifestyle modifications today and Troy [name] will continue to work on diet, exercise and weight loss efforts. Orders and follow up as documented in patient record.   Counseling Intensive lifestyle modifications are the first line treatment for this issue. Dietary changes: Increase soluble fiber. Decrease simple carbohydrates. Exercise changes: Moderate to vigorous-intensity aerobic activity 150 minutes per week if tolerated. Lipid-lowering medications: see documented in medical record. Check labs today.  - Lipid panel  3. Vitamin D deficiency Low Vitamin D level contributes to fatigue and are associated  with obesity, breast, and colon cancer. He agrees to continue to take OTC  Vitamin D @1 ,000 IU QD and will follow-up for routine testing of Vitamin D, at least 2-3 times per year to avoid over-replacement. Check labs today.  - VITAMIN D 25 Hydroxy (Vit-D  Deficiency, Fractures)  4. At risk for heart disease Troy [name] was given approximately 15 minutes of coronary artery disease prevention counseling today. He is 50 y.o. male and has risk factors for heart disease including obesity. We discussed intensive lifestyle modifications today with an emphasis on specific weight loss instructions and strategies.   Repetitive spaced learning was employed today to elicit superior memory formation and behavioral change.  5. Current BMI 38.5  Troy [name] is currently in the action stage of change. As such, his goal is to continue with weight loss efforts. He has agreed to the Category 3 Plan.   Exercise goals:  Banded exercises 2 times a week  Behavioral modification strategies: increasing lean protein intake, decreasing simple carbohydrates, meal planning and cooking strategies, keeping healthy foods in the home, and celebration eating strategies.  Troy [name] has agreed to follow-up with our clinic in 4 weeks. He was informed of the importance of frequent follow-up visits to maximize his success with intensive lifestyle modifications for his multiple health conditions.   Troy [name] was informed we would discuss his lab results at his next visit unless there is a critical issue that needs to be addressed sooner. Troy [name] agreed to keep his next visit at the agreed upon time to discuss these results.  Objective:   Blood pressure 99/67, pulse 62, temperature 97.6 F (36.4 C), height 5\' 10"  (1.778 m), weight 268 lb (121.6 kg), SpO2 99 %. Body mass index is 38.45 kg/m.  General: Cooperative, alert, well developed, in no acute distress. HEENT: Conjunctivae and lids unremarkable. Cardiovascular: Regular rhythm.  Lungs: Normal work of breathing. Neurologic: No focal deficits.   Lab Results  Component Value Date   CREATININE 0.84 09/06/2020   BUN 13 09/06/2020   NA 139 09/06/2020   K 4.5 09/06/2020   CL 96 09/06/2020   CO2 21 09/06/2020   Lab Results   Component Value Date   ALT 35 09/06/2020   AST 23 09/06/2020   ALKPHOS 75 09/06/2020   BILITOT 0.5 09/06/2020   Lab Results  Component Value Date   HGBA1C 8.2 (H) 09/06/2020   HGBA1C 6.7 (H) 05/25/2020   HGBA1C 5.9 (A) 11/30/2019   HGBA1C 6.4 (A) 07/27/2019   HGBA1C 7.5 (A) 04/27/2019   Lab Results  Component Value Date   INSULIN 7.9 09/06/2020   Lab Results  Component Value Date   TSH 1.32 04/27/2019   Lab Results  Component Value Date   CHOL 160 09/06/2020   HDL 36 (L) 09/06/2020   LDLCALC 87 09/06/2020   LDLDIRECT 125.0 12/26/2018   TRIG 220 (H) 09/06/2020   CHOLHDL 4.4 09/06/2020   Lab Results  Component Value Date   VD25OH 55.3 09/06/2020   VD25OH 63.0 06/13/2020   VD25OH 56.8 12/21/2019   Lab Results  Component Value Date   WBC 10.3 05/25/2020   HGB 16.5 05/25/2020   HCT 48.3 05/25/2020   MCV 95.5 05/25/2020   PLT 208 05/25/2020   Lab Results  Component Value Date   IRON 144 02/08/2020   TIBC 358 02/08/2020   FERRITIN 96 02/08/2020    Attestation Statements:   Reviewed by clinician on day of visit: allergies, medications, problem list, medical history, surgical history, family history,  social history, and previous encounter notes.  Coral Ceo, CMA, am acting as transcriptionist for Mina Marble, NP.  I have reviewed the above documentation for accuracy and completeness, and I agree with the above. -  Julyssa Kyer d. Auden Tatar, NP-C

## 2020-09-27 DIAGNOSIS — R42 Dizziness and giddiness: Secondary | ICD-10-CM | POA: Diagnosis not present

## 2020-09-27 DIAGNOSIS — Z20822 Contact with and (suspected) exposure to covid-19: Secondary | ICD-10-CM | POA: Diagnosis not present

## 2020-09-27 DIAGNOSIS — R509 Fever, unspecified: Secondary | ICD-10-CM | POA: Diagnosis not present

## 2020-09-28 ENCOUNTER — Other Ambulatory Visit: Payer: Self-pay | Admitting: Internal Medicine

## 2020-10-07 DIAGNOSIS — G4733 Obstructive sleep apnea (adult) (pediatric): Secondary | ICD-10-CM | POA: Diagnosis not present

## 2020-10-10 ENCOUNTER — Telehealth: Payer: Self-pay | Admitting: Internal Medicine

## 2020-10-10 NOTE — Telephone Encounter (Signed)
Left a message for the pt to return my call.  

## 2020-10-10 NOTE — Telephone Encounter (Signed)
Forms have been placed in red folder for review.

## 2020-10-10 NOTE — Telephone Encounter (Signed)
This is a formal accommodation form last visit with me was November 2021 Please have him make an appointment with me prefer in person but could do virtual .  As I see that he has been seen by a number of other of his providers.

## 2020-10-10 NOTE — Telephone Encounter (Signed)
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  Fax to: [phone], Attn: [facility].    **Pt states should be the same info as June 2021

## 2020-10-11 DIAGNOSIS — G4733 Obstructive sleep apnea (adult) (pediatric): Secondary | ICD-10-CM | POA: Diagnosis not present

## 2020-10-11 NOTE — Telephone Encounter (Signed)
OV scheduled for 08/03.

## 2020-10-12 ENCOUNTER — Ambulatory Visit (INDEPENDENT_AMBULATORY_CARE_PROVIDER_SITE_OTHER): Payer: BC Managed Care – PPO | Admitting: Internal Medicine

## 2020-10-12 ENCOUNTER — Encounter: Payer: Self-pay | Admitting: Internal Medicine

## 2020-10-12 ENCOUNTER — Ambulatory Visit (INDEPENDENT_AMBULATORY_CARE_PROVIDER_SITE_OTHER): Payer: BC Managed Care – PPO | Admitting: Adult Health

## 2020-10-12 ENCOUNTER — Encounter (INDEPENDENT_AMBULATORY_CARE_PROVIDER_SITE_OTHER): Payer: Self-pay

## 2020-10-12 ENCOUNTER — Other Ambulatory Visit: Payer: Self-pay

## 2020-10-12 VITALS — BP 110/70 | HR 78 | Temp 98.3°F | Ht 70.0 in | Wt 279.2 lb

## 2020-10-12 DIAGNOSIS — I82501 Chronic embolism and thrombosis of unspecified deep veins of right lower extremity: Secondary | ICD-10-CM

## 2020-10-12 DIAGNOSIS — Z794 Long term (current) use of insulin: Secondary | ICD-10-CM

## 2020-10-12 DIAGNOSIS — M5090 Cervical disc disorder, unspecified, unspecified cervical region: Secondary | ICD-10-CM | POA: Diagnosis not present

## 2020-10-12 DIAGNOSIS — E118 Type 2 diabetes mellitus with unspecified complications: Secondary | ICD-10-CM

## 2020-10-12 DIAGNOSIS — I959 Hypotension, unspecified: Secondary | ICD-10-CM

## 2020-10-12 DIAGNOSIS — M5412 Radiculopathy, cervical region: Secondary | ICD-10-CM | POA: Diagnosis not present

## 2020-10-12 NOTE — Patient Instructions (Addendum)
Plan cpx in fall  You will be contacted about echocardiogram. Will complete form.

## 2020-10-12 NOTE — Progress Notes (Signed)
Chief Complaint  Patient presents with   Form completion     HPI: Troy [name] 50 y.o. come in for updated evaluation.  And work accommodation evaluation Troy [name] has had ongoing for cervical disc disease and radiculopathy and surgery requiring accommodation for workspace  also has right upper extremity residual weakness from injury and numb fingers and history of right recurrent DVT lower extremity seeming lymphedema that he has managed with anticoagulation and you waited compression.  Elevation. He requires a sit/stand desk and a foot rest as a work accommodation because to limit flexion extension of his neck ability to type and raises right lower extremity that has chronic edema.  To prevent pain and further aggravation. So has controlled underlying diabetes working on weight control obstructive sleep apnea. Is followed by endocrinology and weight management with some success. Has been able to stop the lyric as  neuropathic pain is improved . Has had low blood pressure despite no he hypertensive but has been on Invokana and Ozempic.  States that he does stay hydrated and is not hypoglycemic At times his blood pressure is in the high 90s and he will feel lightheaded.  Denies any specific tachycardia chest pain shortness of breath episodes. Under treatment for sleep apnea. ROS: See pertinent positives and negatives per HPI.  Past Medical History:  Diagnosis Date   Arm weakness    right    Asthma    B12 deficiency    Back pain    Complication of anesthesia    "as a teenager , as they were waken up, I was shaking all over- they called it convulsions -it has never happen again"   Diabetes insipidus ([place])    Diabetes mellitus without complication ([place])    TYPE II   DVT (deep venous thrombosis) ([name])    right leg last time 2020   Leg edema, right    Male infertility    Neck injury 12/22/2013   Neuropathy    Obesity    Obesity    OSA (obstructive sleep apnea)    CPAP     Family History  Problem Relation Age of Onset   Heart disease Mother    Other Mother        clotting disorders   Diabetes Mother    High blood pressure Mother    Kidney disease Mother    Depression Mother    Sleep apnea Mother    Obesity Mother    Other Father        sudden death/cervical and lumbar disk disease   Aneurysm Father        In his 27s smoked   Hyperlipidemia Father    High blood pressure Father    Sudden death Father    Alcoholism Father    Hypertension Other    Allergies Other    Deep vein thrombosis Other    Sleep apnea Other    Obesity Other     Social History   Socioeconomic History   Marital status: Married    Spouse name: Juliann Pulse   Number of children: 1   Years of education: Not on file   Highest education level: Not on file  Occupational History   Occupation: Customer Service Lead  Tobacco Use   Smoking status: Never   Smokeless tobacco: Never  Vaping Use   Vaping Use: Never used  Substance and Sexual Activity   Alcohol use: Yes    Comment: 1 - 2 a year   Drug  use: No   Sexual activity: Yes  Other Topics Concern   Not on file  Social History Narrative   Occupation:  days [employer] 11- 8 am  Now changed job and shift to [employer] through Thursday    Working  Ft 40 hours mostly   Married '10 10 10   ' Wife s/p bariatric surgery pt   Regular exercise- yes   Pt does have children   Father died suddenly 2008-05-25   Mom passes 2017  Acute rep disease after acute renal failure   Daily caffeine use one a day   2 dogs and cat.             Social Determinants of Health   Financial Resource Strain: Not on file  Food Insecurity: Not on file  Transportation Needs: Not on file  Physical Activity: Not on file  Stress: Not on file  Social Connections: Not on file    Outpatient Medications Prior to Visit  Medication Sig Dispense Refill   albuterol (PROVENTIL HFA;VENTOLIN HFA) 108 (90 Base) MCG/ACT inhaler Inhale 2 puffs into the  lungs every 6 (six) hours as needed. For wheezing 1 Inhaler 2   apixaban (ELIQUIS) 5 MG TABS tablet TAKE 1 TABLET(5 MG) BY MOUTH TWICE DAILY (Patient taking differently: Take 5 mg by mouth daily.) 180 tablet 1   atorvastatin (LIPITOR) 40 MG tablet TAKE 1 TABLET DAILY 90 tablet 1   cholecalciferol (VITAMIN D3) 25 MCG (1000 UNIT) tablet Take 1,000 Units by mouth daily.     Continuous Blood Gluc Sensor (FREESTYLE LIBRE 14 DAY SENSOR) MISC CHANGE EVERY 14 DAYS AS DIRECTED, CHANGE EVERY 2 WEEKS 4 each 1   Cyanocobalamin (B-12) 5000 MCG CAPS Take 5,000 mcg by mouth daily.     docusate sodium (COLACE) 100 MG capsule Take 1 capsule (100 mg total) by mouth 2 (two) times daily. 60 capsule 0   Insulin Disposable Pump (V-GO 30) KIT Use 1 a day 90 kit 3   insulin lispro (HUMALOG) 100 UNIT/ML injection INJECT UP TO 80 UNITS DAILY IN VGO. 90 mL 3   INVOKANA 100 MG TABS tablet TAKE 1 TABLET DAILY BEFORE BREAKFAST 90 tablet 3   metFORMIN (GLUCOPHAGE-XR) 500 MG 24 hr tablet TAKE 4 TABLETS DAILY WITH BREAKFAST (Patient taking differently: Take 2,000 mg by mouth daily.) 360 tablet 3   oxyCODONE-acetaminophen (PERCOCET/ROXICET) 5-325 MG tablet Take 1-2 tablets by mouth every 4 (four) hours as needed for moderate pain. 30 tablet 0   pregabalin (LYRICA) 75 MG capsule Take 1 capsule (75 mg total) by mouth 2 (two) times daily as needed. Nerve pain 180 capsule 3   Semaglutide,0.25 or 0.5MG/DOS, (OZEMPIC, 0.25 OR 0.5 MG/DOSE,) 2 MG/1.5ML SOPN Inject 1 mg into the skin once a week. 9 mL 0   No facility-administered medications prior to visit.     EXAM:  BP 110/70 (BP Location: Left Arm, Patient Position: Sitting, Cuff Size: Large)   Pulse 78   Temp 98.3 F (36.8 C) (Oral)   Ht '5\' 10"'  (1.778 m)   Wt 279 lb 3.2 oz (126.6 kg)   SpO2 96%   BMI 40.06 kg/m   Body mass index is 40.06 kg/m.  GENERAL: vitals reviewed and listed above, alert, oriented, appears well hydrated and in no acute distress HEENT: atraumatic,  conjunctiva  clear, no obvious abnormalities on inspection of external nose and ears OP : masked NECK: no obvious masses on inspection palpation  LUNGS: clear to auscultation bilaterally,  no wheezes, rales or rhonchi, good air movement CV: HRRR, no clubbing cyanosis r le graduated wrap   MS: moves all extremities RUE mild atrophy nl rom  PSYCH: pleasant and cooperative, no obvious depression or anxiety Gati seems normal  Lab Results  Component Value Date   WBC 10.3 05/25/2020   HGB 16.5 05/25/2020   HCT 48.3 05/25/2020   PLT 208 05/25/2020   GLUCOSE 161 (H) 09/06/2020   CHOL 160 09/06/2020   TRIG 220 (H) 09/06/2020   HDL 36 (L) 09/06/2020   LDLDIRECT 125.0 12/26/2018   LDLCALC 87 09/06/2020   ALT 35 09/06/2020   AST 23 09/06/2020   NA 139 09/06/2020   K 4.5 09/06/2020   CL 96 09/06/2020   CREATININE 0.84 09/06/2020   BUN 13 09/06/2020   CO2 21 09/06/2020   TSH 1.32 04/27/2019   HGBA1C 8.2 (H) 09/06/2020   MICROALBUR 0.5 02/08/2020   BP Readings from Last 3 Encounters:  10/12/20 110/70  09/06/20 99/67  08/15/20 108/72   Lab Results  Component Value Date   VITAMINB12 >2000 (H) 09/06/2020    ASSESSMENT AND PLAN:  Discussed the following assessment and plan:  Cervical disc disorder  Cervical radiculitis  Chronic deep vein thrombosis (DVT) of right lower extremity, unspecified vein ([name])  Hypotension, unspecified hypotension type - Plan: ECHOCARDIOGRAM COMPLETE  Type 2 diabetes mellitus with complication, with long-term current use of insulin ([place]) - Plan: ECHOCARDIOGRAM COMPLETE Continue current accommodations with sit/stand desk and foot rest.  This seems to be working adequately.  These conditions appear to be stable and permanent. To new weight management and diabetes control. Complaining of intermittent low blood pressure symptomatic somewhat.  On no antihypertensives except possible effects of the GLP-1 and SGLT2 medicines but not obvious.  Discussed  differential diagnosis no signs of heart failure other causes  Plan echocardiogram consider cardiology consult.  Will complete form for his work accommodation.  Make appointment for yearly CPX in the fall when due. -Patient advised to return or notify health care team  if  new concerns arise.  There are no Patient Instructions on file for this visit.   [name]. Hedaya Latendresse M.D.

## 2020-10-13 DIAGNOSIS — G4733 Obstructive sleep apnea (adult) (pediatric): Secondary | ICD-10-CM | POA: Diagnosis not present

## 2020-10-22 DIAGNOSIS — E1165 Type 2 diabetes mellitus with hyperglycemia: Secondary | ICD-10-CM | POA: Diagnosis not present

## 2020-10-22 DIAGNOSIS — R55 Syncope and collapse: Secondary | ICD-10-CM | POA: Diagnosis not present

## 2020-10-23 NOTE — Telephone Encounter (Signed)
Form was completed  and sent to [facility]ma  August 5th

## 2020-10-24 ENCOUNTER — Other Ambulatory Visit: Payer: Self-pay

## 2020-10-24 ENCOUNTER — Encounter: Payer: Self-pay | Admitting: Internal Medicine

## 2020-10-24 DIAGNOSIS — R55 Syncope and collapse: Secondary | ICD-10-CM | POA: Diagnosis not present

## 2020-10-24 MED ORDER — APIXABAN 5 MG PO TABS: ORAL_TABLET | ORAL | 1 refills | Status: DC

## 2020-10-25 ENCOUNTER — Other Ambulatory Visit: Payer: Self-pay

## 2020-10-25 MED ORDER — APIXABAN 5 MG PO TABS: ORAL_TABLET | ORAL | 1 refills | Status: DC

## 2020-10-26 ENCOUNTER — Encounter (INDEPENDENT_AMBULATORY_CARE_PROVIDER_SITE_OTHER): Payer: Self-pay | Admitting: Bariatrics

## 2020-10-26 ENCOUNTER — Ambulatory Visit (INDEPENDENT_AMBULATORY_CARE_PROVIDER_SITE_OTHER): Payer: BC Managed Care – PPO | Admitting: Bariatrics

## 2020-10-26 ENCOUNTER — Other Ambulatory Visit: Payer: Self-pay

## 2020-10-26 ENCOUNTER — Encounter (HOSPITAL_COMMUNITY): Payer: Self-pay

## 2020-10-26 ENCOUNTER — Telehealth: Payer: Self-pay | Admitting: Pharmacy Technician

## 2020-10-26 VITALS — BP 118/79 | HR 90 | Temp 97.9°F | Ht 70.0 in | Wt 267.0 lb

## 2020-10-26 DIAGNOSIS — E785 Hyperlipidemia, unspecified: Secondary | ICD-10-CM

## 2020-10-26 DIAGNOSIS — E118 Type 2 diabetes mellitus with unspecified complications: Secondary | ICD-10-CM | POA: Diagnosis not present

## 2020-10-26 DIAGNOSIS — Z6839 Body mass index (BMI) 39.0-39.9, adult: Secondary | ICD-10-CM

## 2020-10-26 DIAGNOSIS — E1169 Type 2 diabetes mellitus with other specified complication: Secondary | ICD-10-CM | POA: Diagnosis not present

## 2020-10-26 DIAGNOSIS — Z794 Long term (current) use of insulin: Secondary | ICD-10-CM | POA: Diagnosis not present

## 2020-10-26 NOTE — Telephone Encounter (Addendum)
Patient Advocate Encounter   Received notification from PATIENT that prior authorization for [facility] is required.   PA submitted on 10/26/2020 Key [id number] Status is DENIED  The patient's plan wants him to use one of the preferred medications:   [place] or  Jardiance.  Provider notified.    [facility] will continue to follow   Ronney Asters, CPhT Patient Advocate [facility] Phone: [phone] Fax:  [phone]

## 2020-10-27 ENCOUNTER — Other Ambulatory Visit: Payer: Self-pay | Admitting: Internal Medicine

## 2020-10-27 ENCOUNTER — Ambulatory Visit (INDEPENDENT_AMBULATORY_CARE_PROVIDER_SITE_OTHER): Payer: BC Managed Care – PPO | Admitting: Physician Assistant

## 2020-10-27 MED ORDER — EMPAGLIFLOZIN 25 MG PO TABS: 25.0000 mg | ORAL_TABLET | Freq: Every day | ORAL | 3 refills | Status: DC

## 2020-10-27 NOTE — Telephone Encounter (Signed)
Pt advised.

## 2020-10-27 NOTE — Progress Notes (Signed)
Chief Complaint:   OBESITY Troy [name] is here to discuss his progress with his obesity treatment plan along with follow-up of his obesity related diagnoses. [name] is on the Category 3 Plan and states he is following his eating plan approximately 0% of the time. [name] states he is doing 0 0 minutes 0 times per week.  Today's visit was #: 21 Starting weight: 293 lbs Starting date: 09/07/2019 Today's weight: 267 lbs Today's date: 10/26/2020 Total lbs lost to date: 29 lbs Total lbs lost since last in-office visit: 1 lb  Interim History: Troy [name] is down 1 lb since his last visit, but his body fat down 2.6 lbs.  Subjective:   1. Type 2 diabetes mellitus with complication, with long-term current use of insulin ([name]) Troy [name] is currently taking Invokana, Humalog, Metformin and Ozempic. No lows. [name] is currently usingFree style Libre Insulin Pump every 14 days.  2. Hyperlipidemia associated with type 2 diabetes mellitus ([place]) Troy [name] is currently taking Lipitor.  Assessment/Plan:   1. Type 2 diabetes mellitus with complication, with long-term current use of insulin ([name]) Troy [name] will continue medications. Good blood sugar control is important to decrease the likelihood of diabetic complications such as nephropathy, neuropathy, limb loss, blindness, coronary artery disease, and death. Intensive lifestyle modification including diet, exercise and weight loss are the first line of treatment for diabetes.    2. Hyperlipidemia associated with type 2 diabetes mellitus ([name]) Cardiovascular risk and specific lipid/LDL goals reviewed.  We discussed several lifestyle modifications today and [name] will continue to work on diet, exercise and weight loss efforts. [name] will continue medications. Orders and follow up as documented in patient record.   Counseling Intensive lifestyle modifications are the first line treatment for this issue. Dietary changes: Increase soluble fiber. Decrease  simple carbohydrates. Exercise changes: Moderate to vigorous-intensity aerobic activity 150 minutes per week if tolerated. Lipid-lowering medications: see documented in medical record.   3. Obesity, current BMI 38.4 [name] is currently in the action stage of change. As such, his goal is to continue with weight loss efforts. He has agreed to the Category 3 Plan.   Troy [name] will continue meal planning. He will be mindful eating. He will increase protein. Recipes II was given today.  Exercise goals: No exercise has been prescribed at this time.  Behavioral modification strategies: increasing lean protein intake, decreasing simple carbohydrates, increasing vegetables, increasing water intake, ways to avoid boredom eating, ways to avoid night time snacking, better snacking choices, and keeping a strict food journal.  Troy [name] has agreed to follow-up with our clinic in 2-3 weeks with Troy Marble, NP. He was informed of the importance of frequent follow-up visits to maximize his success with intensive lifestyle modifications for his multiple health conditions.   Objective:   Blood pressure 118/79, pulse 90, temperature 97.9 F (36.6 C), height '5\' 10"'$  (1.778 m), weight 267 lb (121.1 kg), SpO2 98 %. Body mass index is 38.31 kg/m.  General: Cooperative, alert, well developed, in no acute distress. HEENT: Conjunctivae and lids unremarkable. Cardiovascular: Regular rhythm.  Lungs: Normal work of breathing. Neurologic: No focal deficits.   Lab Results  Component Value Date   CREATININE 0.84 09/06/2020   BUN 13 09/06/2020   NA 139 09/06/2020   K 4.5 09/06/2020   CL 96 09/06/2020   CO2 21 09/06/2020   Lab Results  Component Value Date   ALT 35 09/06/2020   AST 23 09/06/2020   ALKPHOS 75 09/06/2020   BILITOT 0.5 09/06/2020  Lab Results  Component Value Date   HGBA1C 8.2 (H) 09/06/2020   HGBA1C 6.7 (H) 05/25/2020   HGBA1C 5.9 (A) 11/30/2019   HGBA1C 6.4 (A) 07/27/2019   HGBA1C 7.5  (A) 04/27/2019   Lab Results  Component Value Date   INSULIN 7.9 09/06/2020   Lab Results  Component Value Date   TSH 1.32 04/27/2019   Lab Results  Component Value Date   CHOL 160 09/06/2020   HDL 36 (L) 09/06/2020   LDLCALC 87 09/06/2020   LDLDIRECT 125.0 12/26/2018   TRIG 220 (H) 09/06/2020   CHOLHDL 4.4 09/06/2020   Lab Results  Component Value Date   VD25OH 55.3 09/06/2020   VD25OH 63.0 06/13/2020   VD25OH 56.8 12/21/2019   Lab Results  Component Value Date   WBC 10.3 05/25/2020   HGB 16.5 05/25/2020   HCT 48.3 05/25/2020   MCV 95.5 05/25/2020   PLT 208 05/25/2020   Lab Results  Component Value Date   IRON 144 02/08/2020   TIBC 358 02/08/2020   FERRITIN 96 02/08/2020   Attestation Statements:   Reviewed by clinician on day of visit: allergies, medications, problem list, medical history, surgical history, family history, social history, and previous encounter notes.  Time spent on visit including pre-visit chart review and post-visit care and charting was 20 minutes.   I, Lizbeth Bark, RMA, am acting as Location manager for [employer], DO.   I have reviewed the above documentation for accuracy and completeness, and I agree with the above. Troy Lesch, DO

## 2020-10-27 NOTE — Telephone Encounter (Signed)
I sent a prescription for Jardiance to his local pharmacy.  We can definitely use this instead of Invokana.

## 2020-10-28 ENCOUNTER — Ambulatory Visit: Payer: BC Managed Care – PPO | Admitting: Internal Medicine

## 2020-10-29 ENCOUNTER — Encounter (INDEPENDENT_AMBULATORY_CARE_PROVIDER_SITE_OTHER): Payer: Self-pay | Admitting: Bariatrics

## 2020-10-31 ENCOUNTER — Other Ambulatory Visit: Payer: Self-pay

## 2020-10-31 ENCOUNTER — Encounter (HOSPITAL_BASED_OUTPATIENT_CLINIC_OR_DEPARTMENT_OTHER): Payer: Self-pay

## 2020-10-31 ENCOUNTER — Emergency Department (HOSPITAL_BASED_OUTPATIENT_CLINIC_OR_DEPARTMENT_OTHER)
Admission: EM | Admit: 2020-10-31 | Discharge: 2020-10-31 | Disposition: A | Discharge status: Home / Self Care | Payer: BC Managed Care – PPO | Attending: Emergency Medicine | Admitting: Emergency Medicine

## 2020-10-31 ENCOUNTER — Emergency Department (HOSPITAL_BASED_OUTPATIENT_CLINIC_OR_DEPARTMENT_OTHER): Payer: BC Managed Care – PPO

## 2020-10-31 DIAGNOSIS — Z8616 Personal history of COVID-19: Secondary | ICD-10-CM | POA: Diagnosis not present

## 2020-10-31 DIAGNOSIS — Z7984 Long term (current) use of oral hypoglycemic drugs: Secondary | ICD-10-CM | POA: Insufficient documentation

## 2020-10-31 DIAGNOSIS — Z794 Long term (current) use of insulin: Secondary | ICD-10-CM | POA: Diagnosis not present

## 2020-10-31 DIAGNOSIS — J4520 Mild intermittent asthma, uncomplicated: Secondary | ICD-10-CM | POA: Diagnosis not present

## 2020-10-31 DIAGNOSIS — R55 Syncope and collapse: Secondary | ICD-10-CM | POA: Diagnosis not present

## 2020-10-31 DIAGNOSIS — Z7901 Long term (current) use of anticoagulants: Secondary | ICD-10-CM | POA: Diagnosis not present

## 2020-10-31 DIAGNOSIS — E1140 Type 2 diabetes mellitus with diabetic neuropathy, unspecified: Secondary | ICD-10-CM | POA: Insufficient documentation

## 2020-10-31 DIAGNOSIS — R42 Dizziness and giddiness: Secondary | ICD-10-CM | POA: Diagnosis not present

## 2020-10-31 LAB — BASIC METABOLIC PANEL
Anion gap: 12 (ref 5–15)
BUN: 19 mg/dL (ref 6–20)
CO2: 27 mmol/L (ref 22–32)
Calcium: 9.4 mg/dL (ref 8.9–10.3)
Chloride: 99 mmol/L (ref 98–111)
Creatinine, Ser: 0.94 mg/dL (ref 0.61–1.24)
GFR, Estimated: >60 mL/min (ref >60)
Glucose, Bld: 151 mg/dL — ABNORMAL HIGH (ref 70–99)
Potassium: 4.1 mmol/L (ref 3.5–5.1)
Sodium: 138 mmol/L (ref 135–145)

## 2020-10-31 LAB — CBC
HCT: 47.1 % (ref 39.0–52.0)
Hemoglobin: 16.3 g/dL (ref 13.0–17.0)
MCH: 32.7 pg (ref 26.0–34.0)
MCHC: 34.6 g/dL (ref 30.0–36.0)
MCV: 94.4 fL (ref 80.0–100.0)
Platelets: 248 10*3/uL (ref 150–400)
RBC: 4.99 MIL/uL (ref 4.22–5.81)
RDW: 11.9 % (ref 11.5–15.5)
WBC: 9.6 10*3/uL (ref 4.0–10.5)
nRBC: 0 % (ref 0.0–0.2)

## 2020-10-31 LAB — CBG MONITORING, ED: Glucose-Capillary: 136 mg/dL — ABNORMAL HIGH (ref 70–99)

## 2020-10-31 NOTE — Discharge Instructions (Addendum)
Please follow-up with your primary care provider.  I have also given you the information for the ear nose and throat doctor to follow-up with.  You may follow-up with a different ear nose and throat doctor if you prefer however I strongly recommend following up with the ENT eventually.  Please hydrate well.  Drink plenty of water.  Please eat 3 square meals per day and continue to exercise.  Continue to control your blood sugar and blood pressure if you are prescribed medications.  Please return to the ER for any new or concerning symptoms.  While your work-up today was reassuring her symptoms are certainly concerning and need to be further evaluated.

## 2020-10-31 NOTE — ED Triage Notes (Signed)
Pt states had syncopal episode on 8/13. Recent dx of COVID on 8/5. After the syncopal episode, been having dizziness "like motion sickness, & the room is spinning". States has been having hypotension at home.

## 2020-10-31 NOTE — ED Notes (Signed)
Lying: 109/73                86 Standing: 94/62                  66

## 2020-10-31 NOTE — ED Provider Notes (Signed)
[name] Provider Note   CSN: [id number] Arrival date & time: 10/31/20  1448     History Chief Complaint  Patient presents with   Dizziness    Troy [name] is a 50 y.o. male.  HPI Patient is a 50 year old male with past medical history significant for DM2, obesity, OSA, chronic back pain  Patient states that he had COVID-19 the very first week of August/this month and states that he has not quite felt back to normal since that time.  He has felt more weak and fatigued he feels that he gets dizziness occasionally.  Denies any chest pain or shortness of breath lightheadedness seems to be intermittent seems to be positional.  He states he also has occasional dampened hearing and vertigo seems to be positional as well.  He states that he did have an episode 8/13 where he remembers leaning against the counter and then only remembers waking up with the upper half of his body sprawled over-the-counter.  He states that since that time he has continued feeling lightheaded and weak.  States that today he decided to come to the ER because he was at work and felt lightheaded again.  He states his blood pressure has been lower than normal at home but is uncertain of exactly what those numbers are high.  Denies any weakness or numbness no slurred speech or confusion.     Past Medical History:  Diagnosis Date   Arm weakness    right    Asthma    B12 deficiency    Back pain    Complication of anesthesia    "as a teenager , as they were waken up, I was shaking all over- they called it convulsions -it has never happen again"   Diabetes insipidus ([place])    Diabetes mellitus without complication ([place])    TYPE II   DVT (deep venous thrombosis) ([place])    right leg last time 2020   Leg edema, right    Male infertility    Neck injury 12/22/2013   Neuropathy    Obesity    Obesity    OSA (obstructive sleep apnea)    CPAP    Patient Active Problem List    Diagnosis Date Noted   Mandible pain 07/13/2020   Diarrhea 05/16/2020   Cervical radiculitis 04/12/2020   Constipation 03/31/2020   Cervicalgia 02/29/2020   Radiculopathy 01/25/2020   Hyperlipidemia associated with type 2 diabetes mellitus ([place]) 12/22/2019   Vitamin D deficiency 09/08/2019   Low HDL (under 40) 09/08/2019   Pancreatic insufficiency 03/28/2018   Type 2 diabetes mellitus with complication, with long-term current use of insulin ([place]) 09/16/2017   DVT (deep venous thrombosis), right 04/28/2015   Acute deep vein thrombosis (DVT) of femoral vein of right lower extremity ([place]) 04/28/2015   Hemorrhoid 03/23/2013   Varicose vein of leg right 03/23/2013   Visit for preventive health examination 03/23/2013   Diastasis recti 12/22/2012   Tendinitis of right shoulder 09/27/2011   Right shoulder pain 09/27/2011   Allergic rhinitis, cause unspecified 05/23/2011   Sleep disturbance, unspecified 02/21/2011   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   Preventative health care 12/05/2010   Chronic diarrhea of unknown origin pt says not from metformin 12/05/2010   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   URINARY URGENCY 01/21/2009   Hyperlipidemia 01/23/2008   Obstructive sleep apnea 12/22/2007   VARICOSE VEINS, LOWER EXTREMITIES 10/03/2007   ASTHMA 09/19/2007   Mild intermittent asthma 09/19/2007  PLANTAR FASCIITIS, LEFT 06/23/2007   RASH AND OTHER NONSPECIFIC SKIN ERUPTION 05/26/2007   EDEMA 05/26/2007   Class 2 severe obesity with serious comorbidity and body mass index (BMI) of 39.0 to 39.9 in adult ([place]) 02/13/2007   DVT, HX OF 01/15/2007   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    Past Surgical History:  Procedure Laterality Date   ANTERIOR CERVICAL DECOMP/DISCECTOMY FUSION N/A 05/25/2020   Procedure: ANTERIOR CERVICAL DECOMPRESSION/DISCECTOMY FUSION, INTERBODY PROSTHESIS, PLATE/SCREWS CERVICAL FIVE- CERVICAL SIX;  Surgeon: Newman Pies, MD;  Location: [facility];  Service:  Neurosurgery;  Laterality: N/A;   BACK SURGERY     CERVICAL DISC SURGERY     C6/C7 2009   cubital tunnel left arm     2003   ELBOW SURGERY     FIBULA FRACTURE SURGERY     plate & pin removed due to infection [place]         Family History  Problem Relation Age of Onset   Heart disease Mother    Other Mother        clotting disorders   Diabetes Mother    High blood pressure Mother    Kidney disease Mother    Depression Mother    Sleep apnea Mother    Obesity Mother    Other Father        sudden death/cervical and lumbar disk disease   Aneurysm Father        In his 46s smoked   Hyperlipidemia Father    High blood pressure Father    Sudden death Father    Alcoholism Father    Hypertension Other    Allergies Other    Deep vein thrombosis Other    Sleep apnea Other    Obesity Other     Social History   Tobacco Use   Smoking status: Never   Smokeless tobacco: Never  Vaping Use   Vaping Use: Never used  Substance Use Topics   Alcohol use: Yes    Comment: 1 - 2 a year   Drug use: No    Home Medications Prior to Admission medications   Medication Sig Start Date End Date Taking? Authorizing Provider  albuterol (PROVENTIL HFA;VENTOLIN HFA) 108 (90 Base) MCG/ACT inhaler Inhale 2 puffs into the lungs every 6 (six) hours as needed. For wheezing 07/22/17   [name], [name], MD  apixaban (ELIQUIS) 5 MG TABS tablet TAKE 1 TABLET(5 MG) BY MOUTH TWICE DAILY 10/25/20   [name], [name], MD  atorvastatin (LIPITOR) 40 MG tablet TAKE 1 TABLET DAILY 09/29/20   [name], [name], MD  cholecalciferol (VITAMIN D3) 25 MCG (1000 UNIT) tablet Take 1,000 Units by mouth daily.    [provider]  Continuous Blood Gluc Sensor (FREESTYLE LIBRE 14 DAY SENSOR) MISC CHANGE EVERY 14 DAYS AS DIRECTED, CHANGE EVERY 2 WEEKS 06/13/20   Philemon Kingdom, MD  Cyanocobalamin (B-12) 5000 MCG CAPS Take 5,000 mcg by mouth daily.    [provider]  docusate sodium (COLACE)  100 MG capsule Take 1 capsule (100 mg total) by mouth 2 (two) times daily. 05/26/20   Newman Pies, MD  empagliflozin (JARDIANCE) 25 MG TABS tablet Take 1 tablet (25 mg total) by mouth daily before breakfast. 10/27/20   Philemon Kingdom, MD  Insulin Disposable Pump (V-GO 30) KIT Use 1 a day 06/10/20   Philemon Kingdom, MD  insulin lispro (HUMALOG) 100 UNIT/ML injection INJECT UP TO 82 UNITS DAILY IN VGO. 06/10/20   Gherghe,  Salena Saner, MD  metFORMIN (GLUCOPHAGE-XR) 500 MG 24 hr tablet TAKE 4 TABLETS DAILY WITH BREAKFAST Patient taking differently: Take 2,000 mg by mouth daily. 02/15/20   Philemon Kingdom, MD  oxyCODONE-acetaminophen (PERCOCET/ROXICET) 5-325 MG tablet Take 1-2 tablets by mouth every 4 (four) hours as needed for moderate pain. 05/26/20   Newman Pies, MD  pregabalin (LYRICA) 75 MG capsule Take 1 capsule (75 mg total) by mouth 2 (two) times daily as needed. Nerve pain 11/27/19   Gregor Hams, MD  Semaglutide,0.25 or 0.5MG/DOS, (OZEMPIC, 0.25 OR 0.5 MG/DOSE,) 2 MG/1.5ML SOPN Inject 1 mg into the skin once a week. 06/16/20   Philemon Kingdom, MD    Allergies    Patient has no known allergies.  Review of Systems   Review of Systems  Constitutional:  Negative for chills and fever.  HENT:  Negative for congestion.   Eyes:  Negative for pain.  Respiratory:  Negative for cough and shortness of breath.   Cardiovascular:  Negative for chest pain and leg swelling.  Gastrointestinal:  Negative for abdominal pain and vomiting.  Genitourinary:  Negative for dysuria.  Musculoskeletal:  Negative for myalgias.  Skin:  Negative for rash.  Neurological:  Positive for syncope and light-headedness. Negative for dizziness and headaches.   Physical Exam Updated Vital Signs BP 103/62 (BP Location: Right Arm)   Pulse 87   Temp 98.4 F (36.9 C) (Oral)   Resp 17   Ht _0  (1.778 m)   Wt 121.1 kg   SpO2 99%   BMI 38.31 kg/m   Physical Exam Vitals and nursing note reviewed.   Constitutional:      General: He is not in acute distress. HENT:     Head: Normocephalic and atraumatic.     Nose: Nose normal.     Mouth/Throat:     Mouth: Mucous membranes are moist.  Eyes:     General: No scleral icterus.    Extraocular Movements: Extraocular movements intact.     Comments: No nystagmus  Cardiovascular:     Rate and Rhythm: Normal rate and regular rhythm.     Pulses: Normal pulses.     Heart sounds: Normal heart sounds.  Pulmonary:     Effort: Pulmonary effort is normal. No respiratory distress.     Breath sounds: No wheezing.  Abdominal:     Palpations: Abdomen is soft.     Tenderness: There is no abdominal tenderness.  Musculoskeletal:     Cervical back: Normal range of motion.     Right lower leg: No edema.     Left lower leg: No edema.  Skin:    General: Skin is warm and dry.     Capillary Refill: Capillary refill takes less than 2 seconds.  Neurological:     Mental Status: He is alert. Mental status is at baseline.     Comments: Alert and oriented to self, place, time and event.   Speech is fluent, clear without dysarthria or dysphasia.   Strength 5/5 in upper/lower extremities   Sensation intact in upper/lower extremities   Normal gait.  Normal finger-to-nose and feet tapping.  CN I not tested  CN II grossly intact visual fields bilaterally. Did not visualize posterior eye.  CN III, IV, VI PERRLA and EOMs intact bilaterally  CN V Intact sensation to sharp and light touch to the face  CN VII facial movements symmetric  CN VIII not tested  CN IX, X no uvula deviation, symmetric rise of soft palate  CN XI 5/5 SCM and trapezius strength bilaterally  CN XII Midline tongue protrusion, symmetric L/R movements   Psychiatric:        Mood and Affect: Mood normal.        Behavior: Behavior normal.    ED Results / Procedures / Treatments   Labs (all labs ordered are listed, but only abnormal results are displayed) Labs Reviewed  BASIC METABOLIC  PANEL - Abnormal; Notable for the following components:      Result Value   Glucose, Bld 151 (*)    All other components within normal limits  CBG MONITORING, ED - Abnormal; Notable for the following components:   Glucose-Capillary 136 (*)    All other components within normal limits  CBC    EKG None  Radiology DG Chest 2 View  Result Date: 10/31/2020 CLINICAL DATA:  Syncope and dizziness.  Recent diagnosis of COVID. EXAM: CHEST - 2 VIEW COMPARISON:  01/28/2007 FINDINGS: Slightly shallow inspiration. Normal heart size and pulmonary vascularity. No focal airspace disease or consolidation in the lungs. No blunting of costophrenic angles. No pneumothorax. Mediastinal contours appear intact. Postoperative changes in the cervical spine. IMPRESSION: No active cardiopulmonary disease. Electronically Signed   By: Lucienne Capers M.D.   On: 10/31/2020 18:30    Procedures Procedures   Medications Ordered in ED Medications - No data to display  ED Course  I have reviewed the triage vital signs and the nursing notes.  Pertinent labs & imaging results that were available during my care of the patient were reviewed by me and considered in my medical decision making (see chart for details).    MDM Rules/Calculators/A&P                           Patient with ongoing symptoms now approximately 3 weeks in duration of lightheadedness and vertigo has a history of inner ear issues including labyrinthitis that is never been diagnosed with Mnire's disease is having some auditory symptoms concerning for this.  Relatively short episodes of vertigo I have low suspicion for central disease such as acoustic neuroma or MS or posterior cerebellar stroke.  Physical exam is unremarkable he is neurologically intact and well-appearing.  He is not orthostatic when vital signs were obtained.  Heart rate is between 80 and 90 on my examination.  EKG, basic labs, CBC, BMP, chest x-ray obtained.   I personally  reviewed all laboratory work and imaging.  Metabolic panel without any acute abnormality specifically kidney function within normal limits and no significant electrolyte abnormalities. CBC without leukocytosis or significant anemia.   Chest x-ray unremarkable.  EKG nonischemic.  Patient ambulated without difficulty here in the ER.  Will discharge home return precautions given we will follow-up with PCP  Also given ENT information   Final Clinical Impression(s) / ED Diagnoses Final diagnoses:  Lightheadedness  Vertigo    Rx / DC Orders ED Discharge Orders     None        Tedd Sias, [place] 10/31/20 1956    Fredia Sorrow, MD 11/02/20 2008

## 2020-11-02 ENCOUNTER — Telehealth (INDEPENDENT_AMBULATORY_CARE_PROVIDER_SITE_OTHER): Payer: BC Managed Care – PPO | Admitting: Internal Medicine

## 2020-11-02 ENCOUNTER — Encounter: Payer: Self-pay | Admitting: Internal Medicine

## 2020-11-02 VITALS — Temp 98.6°F | Ht 70.0 in | Wt 266.0 lb

## 2020-11-02 DIAGNOSIS — Z8616 Personal history of COVID-19: Secondary | ICD-10-CM

## 2020-11-02 DIAGNOSIS — R42 Dizziness and giddiness: Secondary | ICD-10-CM

## 2020-11-02 DIAGNOSIS — E118 Type 2 diabetes mellitus with unspecified complications: Secondary | ICD-10-CM

## 2020-11-02 DIAGNOSIS — I82501 Chronic embolism and thrombosis of unspecified deep veins of right lower extremity: Secondary | ICD-10-CM | POA: Diagnosis not present

## 2020-11-02 DIAGNOSIS — Z794 Long term (current) use of insulin: Secondary | ICD-10-CM

## 2020-11-02 DIAGNOSIS — Z7901 Long term (current) use of anticoagulants: Secondary | ICD-10-CM

## 2020-11-02 NOTE — Progress Notes (Signed)
Virtual Visit via Video Note  I connected withNAME@ on 11/03/20 at 10:30 AM EDT by a video enabled telemedicine application and verified that I am speaking with the correct person using two identifiers. Location patient: work outside  [employer] office Persons participating in the virtual visit: patient, provider  [place] national recommendations  regarding COVID 19 pandemic   video visit is advised over in office visit for this patient.  Patient aware  of the limitations of evaluation and management by telemedicine and  availability of in person appointments. and agreed to proceed.   HPI: Troy [name] presents for video visit He presents for follow-up of 2 ED visits. He developed COVID with sore throat cough fever August and soon later had a syncopal episode without known tachycardia presented to [facility] ED where evaluation then included EKG lab test apparent heart biomarkers.  Negative work-up although blood pressure was low.  May be the 90 range without tachycardia Then most recently had episodes of what sound like spells of dizziness vertigo spinning seasick feeling hard to keep balance without associated vision change and syncope or tachycardia or headache.  It was felt to be vertiginous and not strokelike again evaluation and has appointment follow-up with the ENT next week. His blood sugars have been adequate is staying hydrated blood pressure does tend to run low.  He has some concerns over and above this as his father died suddenly when under evaluation with a heart monitor about lightheaded or syncopal attacks. Will need an FMLA form signed eventually because of these evaluations and time off of work.  ROS: See pertinent positives and negatives per HPI.  Minor cough is last but no chest pain or shortness of breath. Predated tremor off and on may be in both hands Past Medical History:  Diagnosis Date   Arm weakness    right    Asthma    B12 deficiency    Back  pain    Complication of anesthesia    "as a teenager , as they were waken up, I was shaking all over- they called it convulsions -it has never happen again"   Diabetes insipidus ([place])    Diabetes mellitus without complication ([place])    TYPE II   DVT (deep venous thrombosis) ([place])    right leg last time 2020   Leg edema, right    Male infertility    Neck injury 12/22/2013   Neuropathy    Obesity    Obesity    OSA (obstructive sleep apnea)    CPAP    Past Surgical History:  Procedure Laterality Date   ANTERIOR CERVICAL DECOMP/DISCECTOMY FUSION N/A 05/25/2020   Procedure: ANTERIOR CERVICAL DECOMPRESSION/DISCECTOMY FUSION, INTERBODY PROSTHESIS, PLATE/SCREWS CERVICAL FIVE- CERVICAL SIX;  Surgeon: Newman Pies, MD;  Location: [facility];  Service: Neurosurgery;  Laterality: N/A;   BACK SURGERY     CERVICAL DISC SURGERY     C6/C7 2009   cubital tunnel left arm     2003   ELBOW SURGERY     FIBULA FRACTURE SURGERY     plate & pin removed due to infection [place]      Family History  Problem Relation Age of Onset   Heart disease Mother    Other Mother        clotting disorders   Diabetes Mother    High blood pressure Mother    Kidney disease Mother    Depression Mother    Sleep apnea Mother  Obesity Mother    Other Father        sudden death/cervical and lumbar disk disease   Aneurysm Father        In his 49s smoked   Hyperlipidemia Father    High blood pressure Father    Sudden death Father    Alcoholism Father    Hypertension Other    Allergies Other    Deep vein thrombosis Other    Sleep apnea Other    Obesity Other     Social History   Tobacco Use   Smoking status: Never   Smokeless tobacco: Never  Vaping Use   Vaping Use: Never used  Substance Use Topics   Alcohol use: Yes    Comment: 1 - 2 a year   Drug use: No      Current Outpatient Medications:    albuterol (PROVENTIL HFA;VENTOLIN HFA) 108 (90 Base) MCG/ACT inhaler, Inhale 2  puffs into the lungs every 6 (six) hours as needed. For wheezing, Disp: 1 Inhaler, Rfl: 2   apixaban (ELIQUIS) 5 MG TABS tablet, TAKE 1 TABLET(5 MG) BY MOUTH TWICE DAILY, Disp: 180 tablet, Rfl: 1   atorvastatin (LIPITOR) 40 MG tablet, TAKE 1 TABLET DAILY, Disp: 90 tablet, Rfl: 1   cholecalciferol (VITAMIN D3) 25 MCG (1000 UNIT) tablet, Take 1,000 Units by mouth daily., Disp: , Rfl:    Continuous Blood Gluc Sensor (FREESTYLE LIBRE 14 DAY SENSOR) MISC, CHANGE EVERY 14 DAYS AS DIRECTED, CHANGE EVERY 2 WEEKS, Disp: 4 each, Rfl: 1   Cyanocobalamin (B-12) 5000 MCG CAPS, Take 5,000 mcg by mouth daily., Disp: , Rfl:    docusate sodium (COLACE) 100 MG capsule, Take 1 capsule (100 mg total) by mouth 2 (two) times daily., Disp: 60 capsule, Rfl: 0   empagliflozin (JARDIANCE) 25 MG TABS tablet, Take 1 tablet (25 mg total) by mouth daily before breakfast., Disp: 90 tablet, Rfl: 3   Insulin Disposable Pump (V-GO 30) KIT, Use 1 a day, Disp: 90 kit, Rfl: 3   insulin lispro (HUMALOG) 100 UNIT/ML injection, INJECT UP TO 80 UNITS DAILY IN VGO., Disp: 90 mL, Rfl: 3   metFORMIN (GLUCOPHAGE-XR) 500 MG 24 hr tablet, TAKE 4 TABLETS DAILY WITH BREAKFAST (Patient taking differently: Take 2,000 mg by mouth daily.), Disp: 360 tablet, Rfl: 3   oxyCODONE-acetaminophen (PERCOCET/ROXICET) 5-325 MG tablet, Take 1-2 tablets by mouth every 4 (four) hours as needed for moderate pain., Disp: 30 tablet, Rfl: 0   pregabalin (LYRICA) 75 MG capsule, Take 1 capsule (75 mg total) by mouth 2 (two) times daily as needed. Nerve pain, Disp: 180 capsule, Rfl: 3   Semaglutide,0.25 or 0.5MG/DOS, (OZEMPIC, 0.25 OR 0.5 MG/DOSE,) 2 MG/1.5ML SOPN, Inject 1 mg into the skin once a week., Disp: 9 mL, Rfl: 0  EXAM: BP Readings from Last 3 Encounters:  10/31/20 103/62  10/26/20 118/79  10/12/20 110/70    VITALS per patient if applicable:  GENERAL: alert, oriented, appears well and in no acute distress  HEENT: atraumatic, conjunttiva clear, no  obvious abnormalities on inspection of external nose and ears  NECK: normal movements of the head and neck  LUNGS: on inspection no signs of respiratory distress, breathing rate appears normal, no obvious gross SOB, gasping or wheezing  CV: no obvious cyanosis  MS: moves all visible extremities without noticeable abnormality  PSYCH/NEURO: pleasant and cooperative, no obvious depression or anxiety, speech and thought processing grossly intact Lab Results  Component Value Date   WBC 9.6 10/31/2020   HGB  16.3 10/31/2020   HCT 47.1 10/31/2020   PLT 248 10/31/2020   GLUCOSE 151 (H) 10/31/2020   CHOL 160 09/06/2020   TRIG 220 (H) 09/06/2020   HDL 36 (L) 09/06/2020   LDLDIRECT 125.0 12/26/2018   LDLCALC 87 09/06/2020   ALT 35 09/06/2020   AST 23 09/06/2020   NA 138 10/31/2020   K 4.1 10/31/2020   CL 99 10/31/2020   CREATININE 0.94 10/31/2020   BUN 19 10/31/2020   CO2 27 10/31/2020   TSH 1.32 04/27/2019   HGBA1C 8.2 (H) 09/06/2020   MICROALBUR 0.5 02/08/2020  Record review EKG no acute findings sinus rhythm in ED  ASSESSMENT AND PLAN:  Discussed the following assessment and plan:    ICD-10-CM   1. Orthostatic dizziness  R42     2. Personal history of COVID-19  Z86.16     3. Spells of dizziness  R42    May be vestibular at this time agree with the evaluation    4. Type 2 diabetes mellitus with complication, with long-term current use of insulin ([name])  E11.8    Z79.4     5. Chronic deep vein thrombosis (DVT) of right lower extremity, unspecified vein ([name])  I82.501     6. Chronic anticoagulation  Z79.01       Counseled.  Current symptoms sound vertiginous previous sounds orthostatic. Unclear how much was post COVID or aggravated by COVID. He is on an anticoagulant but has no signs of head bleed. Agree with proceeding with the ENT evaluation ;consideration a head scan ;cardiac evaluation.  Has an echocardiogram scheduled Like a follow-up after his evaluations.    Expectant management and discussion of plan and treatment with opportunity to ask questions and all were answered. The patient agreed with the plan and demonstrated an understanding of the instructions. Get [name] the dates and forms in regard to FMLA intermittently. Advised to call back or seek an in-person evaluation if worsening  or having  further concerns . Revewied  record and visit plan 35 minutes  Return for after evaluation update.   Shanon Ace, MD

## 2020-11-05 ENCOUNTER — Other Ambulatory Visit: Payer: Self-pay | Admitting: Internal Medicine

## 2020-11-05 DIAGNOSIS — E11319 Type 2 diabetes mellitus with unspecified diabetic retinopathy without macular edema: Secondary | ICD-10-CM

## 2020-11-07 DIAGNOSIS — G4733 Obstructive sleep apnea (adult) (pediatric): Secondary | ICD-10-CM | POA: Diagnosis not present

## 2020-11-09 MED ORDER — OZEMPIC (0.25 OR 0.5 MG/DOSE) 2 MG/1.5ML ~~LOC~~ SOPN: 1.0000 mg | PEN_INJECTOR | SUBCUTANEOUS | 0 refills | Status: DC

## 2020-11-10 ENCOUNTER — Telehealth: Payer: Self-pay | Admitting: Internal Medicine

## 2020-11-10 NOTE — Telephone Encounter (Signed)
Patient wife dropped off some paperwork that she needs Dr.[name] to complete. Paperwork will be in the folder.  Paperwork could be faxed to [phone] when completed.  Please advise.

## 2020-11-11 ENCOUNTER — Ambulatory Visit (HOSPITAL_COMMUNITY): Payer: BC Managed Care – PPO | Attending: Cardiology

## 2020-11-11 ENCOUNTER — Other Ambulatory Visit: Payer: Self-pay

## 2020-11-11 DIAGNOSIS — E118 Type 2 diabetes mellitus with unspecified complications: Secondary | ICD-10-CM | POA: Insufficient documentation

## 2020-11-11 DIAGNOSIS — I959 Hypotension, unspecified: Secondary | ICD-10-CM | POA: Insufficient documentation

## 2020-11-11 DIAGNOSIS — Z6838 Body mass index (BMI) 38.0-38.9, adult: Secondary | ICD-10-CM | POA: Diagnosis not present

## 2020-11-11 DIAGNOSIS — Z794 Long term (current) use of insulin: Secondary | ICD-10-CM

## 2020-11-11 DIAGNOSIS — R42 Dizziness and giddiness: Secondary | ICD-10-CM | POA: Diagnosis not present

## 2020-11-11 LAB — ECHOCARDIOGRAM COMPLETE
Area-P 1/2: 3.6 cm2
S' Lateral: 3.1 cm

## 2020-11-14 NOTE — Progress Notes (Signed)
Echocardiogram is   normal.  Rassuring  no explanation for low BP

## 2020-11-14 NOTE — Progress Notes (Signed)
Chief Complaint  Patient presents with   Follow-up    HPI: Troy [name] 50 y.o. come in for   follow-up from ENT clinic and forms. He was evaluated for vertigo at the ENT office and had a near syncope when standing with orthostatic hypotension and looked pale.  Was told to see his PCP or other medical team.  This was typical of his episodes of orthostasis however he continues to have occasional what he calls vertigo dizziness spinning feeling weak like he is going to fall without focal neurologic signs. No predating lightheadedness visual prodrome.   Follow-up also of his ED visit where he actually had loss of consciousness with his syncope. He has had no bleeding episodes of hypoglycemia new medications has been off the semaglutide for 2 weeks because of insurance barriers. Had originally been on Invokana is now on Jardiance.  Is on no antihypertensive medicines and no active bleeding. His father died somewhat sudden death under evaluation for recurrent passing out.  His mom also had heart arrhythmia I believe A. fib. He has an appointment this week with his endocrinologist Dr. Karmen Stabs consider evaluation for adrenal insufficiency.   ROS: See pertinent positives and negatives per HPI.  No current chest pain shortness of breath bleeding he always has cold hands.  No unusual headaches diplopia.  Past Medical History:  Diagnosis Date   Arm weakness    right    Asthma    B12 deficiency    Back pain    Complication of anesthesia    "as a teenager , as they were waken up, I was shaking all over- they called it convulsions -it has never happen again"   Diabetes insipidus ([name])    Diabetes mellitus without complication ([place])    TYPE II   DVT (deep venous thrombosis) ([name])    right leg last time 2020   Leg edema, right    Male infertility    Neck injury 12/22/2013   Neuropathy    Obesity    Obesity    OSA (obstructive sleep apnea)    CPAP    Family History  Problem  Relation Age of Onset   Heart disease Mother    Other Mother        clotting disorders   Diabetes Mother    High blood pressure Mother    Kidney disease Mother    Depression Mother    Sleep apnea Mother    Obesity Mother    Other Father        sudden death/cervical and lumbar disk disease   Aneurysm Father        In his 32s smoked   Hyperlipidemia Father    High blood pressure Father    Sudden death Father    Alcoholism Father    Hypertension Other    Allergies Other    Deep vein thrombosis Other    Sleep apnea Other    Obesity Other     Social History   Socioeconomic History   Marital status: Married    Spouse name: Juliann Pulse   Number of children: 1   Years of education: Not on file   Highest education level: Not on file  Occupational History   Occupation: Customer Service Lead  Tobacco Use   Smoking status: Never   Smokeless tobacco: Never  Vaping Use   Vaping Use: Never used  Substance and Sexual Activity   Alcohol use: Yes    Comment: 1 - 2 a year  Drug use: No   Sexual activity: Yes  Other Topics Concern   Not on file  Social History Narrative   Occupation:  days Time [employer] 11- 8 am  Now changed job and shift to [employer] through Thursday    Working  Ft 40 hours mostly   Married _0 Wife s/p bariatric surgery pt   Regular exercise- yes   Pt does have children   Father died suddenly May 25, 2008   Mom passes 05-26-2015  Acute rep disease after acute renal failure   Daily caffeine use one a day   2 dogs and cat.             Social Determinants of Health   Financial Resource Strain: Not on file  Food Insecurity: Not on file  Transportation Needs: Not on file  Physical Activity: Not on file  Stress: Not on file  Social Connections: Not on file    Outpatient Medications Prior to Visit  Medication Sig Dispense Refill   albuterol (PROVENTIL HFA;VENTOLIN HFA) 108 (90 Base) MCG/ACT inhaler Inhale 2 puffs into the lungs every 6 (six) hours as  needed. For wheezing 1 Inhaler 2   apixaban (ELIQUIS) 5 MG TABS tablet TAKE 1 TABLET(5 MG) BY MOUTH TWICE DAILY 180 tablet 1   atorvastatin (LIPITOR) 40 MG tablet TAKE 1 TABLET DAILY 90 tablet 1   cholecalciferol (VITAMIN D3) 25 MCG (1000 UNIT) tablet Take 1,000 Units by mouth daily.     Continuous Blood Gluc Sensor (FREESTYLE LIBRE 14 DAY SENSOR) MISC CHANGE EVERY 14 DAYS AS DIRECTED, CHANGE EVERY 2 WEEKS 4 each 1   Cyanocobalamin (B-12) 5000 MCG CAPS Take 5,000 mcg by mouth daily.     empagliflozin (JARDIANCE) 25 MG TABS tablet Take 1 tablet (25 mg total) by mouth daily before breakfast. 90 tablet 3   Insulin Disposable Pump (V-GO 30) KIT Use 1 a day 90 kit 3   insulin lispro (HUMALOG) 100 UNIT/ML injection INJECT UP TO 80 UNITS DAILY IN VGO. 90 mL 3   metFORMIN (GLUCOPHAGE-XR) 500 MG 24 hr tablet TAKE 4 TABLETS DAILY WITH BREAKFAST (Patient taking differently: Take 2,000 mg by mouth daily.) 360 tablet 3   pregabalin (LYRICA) 75 MG capsule Take 1 capsule (75 mg total) by mouth 2 (two) times daily as needed. Nerve pain 180 capsule 3   Semaglutide,0.25 or 0.5MG/DOS, (OZEMPIC, 0.25 OR 0.5 MG/DOSE,) 2 MG/1.5ML SOPN Inject 1 mg into the skin once a week. 9 mL 0   docusate sodium (COLACE) 100 MG capsule Take 1 capsule (100 mg total) by mouth 2 (two) times daily. 60 capsule 0   oxyCODONE-acetaminophen (PERCOCET/ROXICET) 5-325 MG tablet Take 1-2 tablets by mouth every 4 (four) hours as needed for moderate pain. 30 tablet 0   No facility-administered medications prior to visit.     EXAM:  BP 108/68 (BP Location: Left Arm, Patient Position: Sitting, Cuff Size: Large)   Pulse 88   Temp 98.1 F (36.7 C) (Oral)   Ht _1  (1.778 m)   Wt 272 lb (123.4 kg)   SpO2 97%   BMI 39.03 kg/m   Body mass index is 39.03 kg/m.  GENERAL: vitals reviewed and listed above, alert, oriented, appears well hydrated and in no acute distress HEENT: atraumatic, conjunctiva  clear, no obvious abnormalities on  inspection of external nose and ears OP : masked  NECK: no obvious masses on inspection palpation  LUNGS: clear to auscultation bilaterally, no wheezes, rales  or rhonchi, good air movement CV: HRRR, no clubbing cyanosis ol cap refill  ortho BP 122/80 70 sit  stand 110/72 ?pulse  no sx today with hti reading MS: moves all extremities without noticeable focal  abnormality PSYCH: pleasant and cooperative, no obvious depression or anxiety Lab Results  Component Value Date   WBC 9.6 10/31/2020   HGB 16.3 10/31/2020   HCT 47.1 10/31/2020   PLT 248 10/31/2020   GLUCOSE 151 (H) 10/31/2020   CHOL 160 09/06/2020   TRIG 220 (H) 09/06/2020   HDL 36 (L) 09/06/2020   LDLDIRECT 125.0 12/26/2018   LDLCALC 87 09/06/2020   ALT 35 09/06/2020   AST 23 09/06/2020   NA 138 10/31/2020   K 4.1 10/31/2020   CL 99 10/31/2020   CREATININE 0.94 10/31/2020   BUN 19 10/31/2020   CO2 27 10/31/2020   TSH 1.32 04/27/2019   HGBA1C 8.2 (H) 09/06/2020   MICROALBUR 0.5 02/08/2020   BP Readings from Last 3 Encounters:  11/15/20 108/68  10/31/20 103/62  10/26/20 118/79   See echocardiogram  1. Left ventricular ejection fraction, by estimation, is 55 to 60%. The  left ventricle has normal function. The left ventricle has no regional  wall motion abnormalities. There is mild left ventricular hypertrophy.  Left ventricular diastolic parameters  are consistent with Grade I diastolic dysfunction (impaired relaxation).   2. Right ventricular systolic function is normal. The right ventricular  size is normal.   3. The mitral valve is normal in structure. No evidence of mitral valve  regurgitation. No evidence of mitral stenosis.   4. The aortic valve is normal in structure. Aortic valve regurgitation is  not visualized. No aortic stenosis is present.   5. The inferior vena cava is normal in size with greater than 50%  respiratory variability, suggesting right atrial pressure of 3 mmHg.  ASSESSMENT AND  PLAN:  Discussed the following assessment and plan:  Orthostatic dizzinesshypotension - Plan: Ambulatory referral to Cardiology  Spells of dizziness - Plan: Ambulatory referral to Cardiology  Family history of sudden death in father - father had recurrent  syncope. and was undergoing cv eval - Plan: Ambulatory referral to Cardiology  Personal history of COVID-19 - [place] predated  covid but worse since then - Plan: Ambulatory referral to Cardiology  Type 2 diabetes mellitus with complication, with long-term current use of insulin ([name]) Spells sound vertiginous and weak feeling.  But is orthostatic has had episode of syncope LOC  when went to ed was better .  Plan cardiology eval and agree with more endocrin eval.  Consider neuro  also   -Patient advised to return or notify health care team  if  new concerns arise.  Patient Instructions  Not sure why you are having  the low bp problem  Stay hydrated  in  short run  add sodium to diet.  And I agree seeing Dr Cruzita Lederer  to check adrenal  and I will do referral to cardiology. Consider seeing neurology .  I have seen covid   infection and  vaccine cause  aggravated lower BP   that resolves. But you had this before  the infection.   Will do FMLA form.   [name]. Queen Abbett M.D.

## 2020-11-15 ENCOUNTER — Ambulatory Visit (INDEPENDENT_AMBULATORY_CARE_PROVIDER_SITE_OTHER): Payer: BC Managed Care – PPO | Admitting: Internal Medicine

## 2020-11-15 ENCOUNTER — Other Ambulatory Visit: Payer: Self-pay

## 2020-11-15 ENCOUNTER — Encounter: Payer: Self-pay | Admitting: Internal Medicine

## 2020-11-15 VITALS — BP 108/68 | HR 88 | Temp 98.1°F | Ht 70.0 in | Wt 272.0 lb

## 2020-11-15 DIAGNOSIS — R42 Dizziness and giddiness: Secondary | ICD-10-CM | POA: Diagnosis not present

## 2020-11-15 DIAGNOSIS — Z8489 Family history of other specified conditions: Secondary | ICD-10-CM

## 2020-11-15 DIAGNOSIS — Z8616 Personal history of COVID-19: Secondary | ICD-10-CM | POA: Diagnosis not present

## 2020-11-15 DIAGNOSIS — E118 Type 2 diabetes mellitus with unspecified complications: Secondary | ICD-10-CM | POA: Diagnosis not present

## 2020-11-15 DIAGNOSIS — Z794 Long term (current) use of insulin: Secondary | ICD-10-CM

## 2020-11-15 NOTE — Patient Instructions (Addendum)
Not sure why you are having  the low bp problem  Stay hydrated  in  short run  add sodium to diet.  And I agree seeing Dr Cruzita Lederer  to check adrenal  and I will do referral to cardiology. Consider seeing neurology .  I have seen covid   infection and  vaccine cause  aggravated lower BP   that resolves. But you had this before  the infection.   Will do FMLA form.

## 2020-11-16 NOTE — Telephone Encounter (Signed)
Form is on your desk. Please fax and  save copy  and one to scan.

## 2020-11-17 NOTE — Telephone Encounter (Signed)
Forms have been faxed and copy has been placed in scan.

## 2020-11-18 ENCOUNTER — Encounter: Payer: Self-pay | Admitting: Internal Medicine

## 2020-11-18 ENCOUNTER — Ambulatory Visit (INDEPENDENT_AMBULATORY_CARE_PROVIDER_SITE_OTHER): Payer: BC Managed Care – PPO | Admitting: Internal Medicine

## 2020-11-18 ENCOUNTER — Other Ambulatory Visit: Payer: Self-pay

## 2020-11-18 VITALS — BP 138/88 | HR 77 | Ht 70.0 in | Wt 278.4 lb

## 2020-11-18 DIAGNOSIS — R55 Syncope and collapse: Secondary | ICD-10-CM

## 2020-11-18 DIAGNOSIS — Z794 Long term (current) use of insulin: Secondary | ICD-10-CM | POA: Diagnosis not present

## 2020-11-18 DIAGNOSIS — E785 Hyperlipidemia, unspecified: Secondary | ICD-10-CM

## 2020-11-18 DIAGNOSIS — E11319 Type 2 diabetes mellitus with unspecified diabetic retinopathy without macular edema: Secondary | ICD-10-CM | POA: Diagnosis not present

## 2020-11-18 DIAGNOSIS — Z6841 Body Mass Index (BMI) 40.0 and over, adult: Secondary | ICD-10-CM

## 2020-11-18 LAB — POCT GLYCOSYLATED HEMOGLOBIN (HGB A1C): Hemoglobin A1C: 6.7 % — AB (ref 4.0–5.6)

## 2020-11-18 MED ORDER — METFORMIN HCL ER 500 MG PO TB24: 2000.0000 mg | ORAL_TABLET | Freq: Every day | ORAL | 3 refills | Status: DC

## 2020-11-18 MED ORDER — SEMAGLUTIDE (1 MG/DOSE) 4 MG/3ML ~~LOC~~ SOPN: 1.0000 mg | PEN_INJECTOR | SUBCUTANEOUS | 3 refills | Status: DC

## 2020-11-18 NOTE — Progress Notes (Signed)
Subjective:     Patient ID: Troy [name], male   DOB: 1970/04/02, 50 y.o.   MRN: [id number]  This visit occurred during the SARS-CoV-2 public health emergency.  Safety protocols were in place, including screening questions prior to the visit, additional usage of staff PPE, and extensive cleaning of exam room while observing appropriate contact time as indicated for disinfecting solutions.   HPI [name] is a pleasant 50 y.o. man, returning for management of DM2, dx 2012, insulin-dependent, with complications (DR), uncontrolled.  Last visit 5 months ago.  Interim history: Before last visit, he had epidural steroid injections (02/2020 and 03/2020) until his cervical decompression surgery (discectomy, fusion) on 05/25/2020. Since last visit, no increased urination, blurry vision, nausea, chest pain. 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  Started weight up by 12 lbs last 2 weeks. He to have vertigo in 09/2020. 2 weeks afterwards >> dx with Covid 19. Then 2 weeks later >> syncope and collapse (CBG 160). He has milder episodes since then - not losing consciousness.  His face is usually white during the episode. He was also orthostatic during the episodes. He drinks 150-200 oz water /sugar-free gatorade a day. He continues to have vertigo.  Reviewed HbA1c levels: Lab Results  Component Value Date   HGBA1C 8.2 (H) 09/06/2020   HGBA1C 6.7 (H) 05/25/2020   HGBA1C 5.9 (A) 11/30/2019   He is on: - Metformin XR 2000 mg in a.m. - Invokana 100 mg >> Jardiance 25 mg before breakfast - VGo 40 >> 30: 2-3 clicks per meal >> 6 clicks per meal >> PFX90 >> now VGo30 (ran out of WIO97) with 4-6 clicks per meal - Ozempic 0.5 mg weekly-started 06/2017 >> 1 mg weekly  - has been out of this for 2 weeks. He was on Victoza before >> no SEs.   He checks his sugars more than 4 times a day with his CGM:   Previously:   Previously:   -No CKD: Lab Results  Component Value Date   BUN 19  10/31/2020   BUN 13 09/06/2020   CREATININE 0.94 10/31/2020   CREATININE 0.84 09/06/2020   No MAU: Lab Results  Component Value Date   MICRALBCREAT 6 02/08/2020   MICRALBCREAT 0.5 12/26/2018   MICRALBCREAT 1.4 07/22/2017   MICRALBCREAT 2.2 12/18/2016   MICRALBCREAT 5.8 10/18/2015   MICRALBCREAT 2.0 03/16/2013   MICRALBCREAT 1.6 11/06/2012   MICRALBCREAT 2.5 07/31/2012   MICRALBCREAT 3.6 11/26/2011   MICRALBCREAT 2.3 11/28/2010  He is not on ACE inhibitor/ARB.  -+ HL: Lab Results  Component Value Date   CHOL 160 09/06/2020   HDL 36 (L) 09/06/2020   LDLCALC 87 09/06/2020   LDLDIRECT 125.0 12/26/2018   TRIG 220 (H) 09/06/2020   CHOLHDL 4.4 09/06/2020  On Lipitor 20.  - Last eye exam was in 04/2019: + DR.  He sees a retina specialist.  -No numbness and tingling in feet - sees podiatry.  Previously on gabapentin, then on Lyrica, then off.  He has OSA and is compliant with his CPAP. He had a DVT in R leg in 04/2015.  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  He noticed that sugars were higher after he started Eliquis. He lost 45 lbs before 2017 - mostly vegan food.  He has a history of ADD. He did see urology before and was given Viagra, but causes for ED were not explored. We checked his testosterone level.  This returned mildly low: Component     Latest Ref Rng & Units 04/27/2019  Testosterone, Serum (Total)     ng/dL 262 (L)  % Free Testosterone     % 1.9  Free Testosterone, S     pg/mL 50 (L)  Sex Hormone Binding Globulin     nmol/L 34.5  After this, I referred him to the weight management clinic.  Reviewed B12 levels: Lab Results  Component Value Date   VITAMINB12 >2000 (H) 09/06/2020   VITAMINB12 237 04/27/2019   VITAMINB12 236 08/19/2017  Previously on B12 1000 mcg daily, then on 5000 mcg daily, but currently off.  Review of Systems: + See HPI + Constitutional: + weight gain/+ weight loss, no fatigue, no subjective  hyperthermia, no subjective hypothermia Eyes: no blurry vision, no xerophthalmia ENT: no sore throat, no nodules palpated in neck, no dysphagia, no odynophagia, no hoarseness Cardiovascular: no CP/no SOB/no palpitations/+ chronic right leg swelling Respiratory: no cough/no SOB/no wheezing Gastrointestinal: no N/no V/no D/no C/no acid reflux Musculoskeletal: no muscle aches/+ joint aches Skin: no rashes no hair loss Neurological: + R and now also left hand tremors/no numbness/no tingling/+ vertigo and dizziness  I reviewed pt's medications, allergies, PMH, social hx, family hx, and changes were documented in the history of present illness. Otherwise, unchanged from my initial visit note.  Past Medical History:  Diagnosis Date   Arm weakness    right    Asthma    B12 deficiency    Back pain    Complication of anesthesia    "as a teenager , as they were waken up, I was shaking all over- they called it convulsions -it has never happen again"   Diabetes insipidus ([place])    Diabetes mellitus without complication ([place])    TYPE II   DVT (deep venous thrombosis) ([name])    right leg last time 2020   Leg edema, right    Male infertility    Neck injury 12/22/2013   Neuropathy    Obesity    Obesity    OSA (obstructive sleep apnea)    CPAP   Past Surgical History:  Procedure Laterality Date   ANTERIOR CERVICAL DECOMP/DISCECTOMY FUSION N/A 05/25/2020   Procedure: ANTERIOR CERVICAL DECOMPRESSION/DISCECTOMY FUSION, INTERBODY PROSTHESIS, PLATE/SCREWS CERVICAL FIVE- CERVICAL SIX;  Surgeon: Newman Pies, MD;  Location: [facility];  Service: Neurosurgery;  Laterality: N/A;   BACK SURGERY     CERVICAL DISC SURGERY     C6/C7 2009   cubital tunnel left arm     2003   ELBOW SURGERY     FIBULA FRACTURE SURGERY     plate & pin removed due to infection [place]     Social History   Socioeconomic History   Marital status: Married    Spouse name: Juliann Pulse   Number of children: 1    Years of education: Not on file   Highest education level: Not on file  Occupational History   Occupation: Customer Service Lead  Tobacco Use   Smoking status: Never   Smokeless tobacco: Never  Vaping Use   Vaping Use: Never used  Substance and Sexual Activity   Alcohol use: Yes    Comment: 1 - 2 a year   Drug use: No   Sexual activity: Yes  Other Topics Concern   Not on file  Social History Narrative   Occupation:  days Time [name]   Worked 11- 8 am  Now changed job and shift  to 9-8 Mon through Thursday    Working  Ft 40 hours mostly   Married _0 Wife s/p bariatric surgery pt   Regular exercise- yes   Pt does have children   Father died suddenly 05/22/08   Mom passes 2017  Acute rep disease after acute renal failure   Daily caffeine use one a day   2 dogs and cat.             Social Determinants of Health   Financial Resource Strain: Not on file  Food Insecurity: Not on file  Transportation Needs: Not on file  Physical Activity: Not on file  Stress: Not on file  Social Connections: Not on file  Intimate Partner Violence: Not on file   Current Outpatient Medications on File Prior to Visit  Medication Sig Dispense Refill   albuterol (PROVENTIL HFA;VENTOLIN HFA) 108 (90 Base) MCG/ACT inhaler Inhale 2 puffs into the lungs every 6 (six) hours as needed. For wheezing 1 Inhaler 2   apixaban (ELIQUIS) 5 MG TABS tablet TAKE 1 TABLET(5 MG) BY MOUTH TWICE DAILY 180 tablet 1   atorvastatin (LIPITOR) 40 MG tablet TAKE 1 TABLET DAILY 90 tablet 1   cholecalciferol (VITAMIN D3) 25 MCG (1000 UNIT) tablet Take 1,000 Units by mouth daily.     Continuous Blood Gluc Sensor (FREESTYLE LIBRE 14 DAY SENSOR) MISC CHANGE EVERY 14 DAYS AS DIRECTED, CHANGE EVERY 2 WEEKS 4 each 1   Cyanocobalamin (B-12) 5000 MCG CAPS Take 5,000 mcg by mouth daily.     empagliflozin (JARDIANCE) 25 MG TABS tablet Take 1 tablet (25 mg total) by mouth daily before breakfast. 90 tablet 3   Insulin Disposable  Pump (V-GO 30) KIT Use 1 a day 90 kit 3   insulin lispro (HUMALOG) 100 UNIT/ML injection INJECT UP TO 80 UNITS DAILY IN VGO. 90 mL 3   metFORMIN (GLUCOPHAGE-XR) 500 MG 24 hr tablet TAKE 4 TABLETS DAILY WITH BREAKFAST (Patient taking differently: Take 2,000 mg by mouth daily.) 360 tablet 3   pregabalin (LYRICA) 75 MG capsule Take 1 capsule (75 mg total) by mouth 2 (two) times daily as needed. Nerve pain 180 capsule 3   Semaglutide,0.25 or 0.5MG/DOS, (OZEMPIC, 0.25 OR 0.5 MG/DOSE,) 2 MG/1.5ML SOPN Inject 1 mg into the skin once a week. 9 mL 0   No current facility-administered medications on file prior to visit.   No Known Allergies Family History  Problem Relation Age of Onset   Heart disease Mother    Other Mother        clotting disorders   Diabetes Mother    High blood pressure Mother    Kidney disease Mother    Depression Mother    Sleep apnea Mother    Obesity Mother    Other Father        sudden death/cervical and lumbar disk disease   Aneurysm Father        In his 76s smoked   Hyperlipidemia Father    High blood pressure Father    Sudden death Father    Alcoholism Father    Hypertension Other    Allergies Other    Deep vein thrombosis Other    Sleep apnea Other    Obesity Other     Objective:   Physical Exam BP 138/88 (BP Location: Left Arm, Patient Position: Sitting, Cuff Size: Normal)   Pulse 77   Ht _1  (1.778 m)   Wt 278 lb 6.4 oz (126.3 kg)  SpO2 97%   BMI 39.95 kg/m  Body mass index is 39.95 kg/m. Wt Readings from Last 3 Encounters:  11/18/20 278 lb 6.4 oz (126.3 kg)  11/15/20 272 lb (123.4 kg)  11/02/20 266 lb (120.7 kg)   Constitutional: overweight, in NAD Eyes: PERRLA, EOMI, no exophthalmos ENT: moist mucous membranes, no thyromegaly, no cervical lymphadenopathy Cardiovascular: RRR, No MRG, + RLE swelling -chronic, after his DVT  Respiratory: CTA B Gastrointestinal: abdomen soft, NT, ND, BS+ Musculoskeletal: no deformities, strength intact in  all 4 Skin: moist, warm, no rashes Neurological: + tremor with R outstretched hand, DTR normal in all 4  Assessment:     1. DM2, insulin-dependent, uncontrolled, with complications - DR  2. Obesity class 3 BMI Classification: < 18.5 underweight  18.5-24.9 normal weight  25.0-29.9 overweight  30.0-34.9 class I obesity  35.0-39.9 class II obesity  ? 40.0 class III obesity   3. HL  4.  Low T  Plan:     1. Patient with history of uncontrolled diabetes, on oral antidiabetic regimen with metformin, SGLT2 inhibitor and also weekly GLP-1 receptor agonist along with the V-Go patch pump.  His diabetes improved and he also started to lose weight after starting to work with the weight management clinic.  In the last year, he switched from working nights, which also helped.  Before last visit, he ran out of his VGo20 reservoirs and switched to VGo30, but sugars were controlled on this so we did not change it.  HbA1c checked 3 months ago was higher, though, at 8.7%, possibly related to epidural steroid injections.   -Since last visit, we had to switch from [place] to [place] per insurance preference. CGM interpretation: -At today's visit, we reviewed his CGM downloads: It appears that 87% of values are in target range (goal >70%), while 30% are higher than 180 (goal <25%), and 0% are lower than 70 (goal <4%).  The calculated average blood sugar is 139.  The projected HbA1c for the next 3 months (GMI) is 6.6%. -Reviewing the CGM trends, his sugars are slightly more fluctuating at today's visit compared to the last CGM download, with higher blood sugars after breakfast and also more variable blood sugars after lunch.  However, upon questioning, weeks and he did notice that sugars are more variable since.  At today's visit, I sent new prescription for Ozempic to his pharmacy and will restart this, but I do not feel that he needs a change in the regimen.  We did discuss about the possibility of using  the CeQur device in the future. - I advised him to: Patient Instructions  Please continue: - Metformin XR 2000 mg at dinnertime - Jardiance 25 mg before b'fast  - [id number] 4-6 clicks per meal  Please restart: - Ozempic 1 mg weekly  Please return fasting, between 8 and 9 in the morning DXA to be here for 1 hour (to check your cortisol).  Please return in 4 months.  - we checked his HbA1c: 6.7% (improved) - advised to check sugars at different times of the day - 4x a day, rotating check times - advised for yearly eye exams >> he is not UTD - return to clinic in 4 months  2. Obesity class 3 -continue SGLT 2 inhibitor and GLP-1 receptor agonist which should also help with weight loss -He is now also managed in the weight management clinic -He gained 12 pounds in the last 2 to 3 weeks as he relaxed his diet after his syncopal  episodes  3. HL -Reviewed latest lipid panel from 08/2020: LDL close to goal of less than 70, triglycerides high, HDL low Lab Results  Component Value Date   CHOL 160 09/06/2020   HDL 36 (L) 09/06/2020   LDLCALC 87 09/06/2020   LDLDIRECT 125.0 12/26/2018   TRIG 220 (H) 09/06/2020   CHOLHDL 4.4 09/06/2020  -He continues on Lipitor 40 mg daily without side effects  4.  History of syncope -He does not have unintentional weight loss, abdominal pain, headaches -He has been on dexamethasone earlier in the year, and also had epidural steroid injections - I explained that these can cause iatrogenic adrenal insufficiency -we will check him for adrenal insufficiency -I will have him return between 8 and 9 AM for cosyntropin stimulation test -He also has an appointment with cardiology and neurology set up  Component     Latest Ref Rng & Units 11/24/2020 11/24/2020 11/24/2020         2:08 PM  2:39 PM  3:05 PM  Cortisol, Plasma     ug/dL 9.1 21.8 28.4  Normal cosyntropin stimulation test-no evidence of adrenal insufficiency.  Philemon Kingdom, MD PhD [facility]  Endocrinology

## 2020-11-18 NOTE — Patient Instructions (Addendum)
Please continue: - Metformin XR 2000 mg at dinnertime - Jardiance 25 mg before b'fast  - [id number] 4-6 clicks per meal  Please restart: - Ozempic 1 mg weekly  Please return fasting, between 8 and 9 in the morning DXA to be here for 1 hour (to check your cortisol).  Please return in 4 months.

## 2020-11-22 ENCOUNTER — Other Ambulatory Visit: Payer: Self-pay

## 2020-11-22 ENCOUNTER — Other Ambulatory Visit (INDEPENDENT_AMBULATORY_CARE_PROVIDER_SITE_OTHER): Payer: BC Managed Care – PPO

## 2020-11-22 ENCOUNTER — Ambulatory Visit: Payer: BC Managed Care – PPO

## 2020-11-22 DIAGNOSIS — R55 Syncope and collapse: Secondary | ICD-10-CM

## 2020-11-24 ENCOUNTER — Ambulatory Visit (INDEPENDENT_AMBULATORY_CARE_PROVIDER_SITE_OTHER): Payer: BC Managed Care – PPO | Admitting: Adult Health

## 2020-11-24 ENCOUNTER — Encounter (INDEPENDENT_AMBULATORY_CARE_PROVIDER_SITE_OTHER): Payer: Self-pay | Admitting: Adult Health

## 2020-11-24 ENCOUNTER — Other Ambulatory Visit: Payer: Self-pay

## 2020-11-24 ENCOUNTER — Other Ambulatory Visit (INDEPENDENT_AMBULATORY_CARE_PROVIDER_SITE_OTHER): Payer: BC Managed Care – PPO

## 2020-11-24 VITALS — BP 109/76 | HR 77 | Temp 98.2°F | Ht 70.0 in | Wt 267.0 lb

## 2020-11-24 DIAGNOSIS — R29898 Other symptoms and signs involving the musculoskeletal system: Secondary | ICD-10-CM

## 2020-11-24 DIAGNOSIS — R55 Syncope and collapse: Secondary | ICD-10-CM

## 2020-11-24 DIAGNOSIS — R42 Dizziness and giddiness: Secondary | ICD-10-CM

## 2020-11-24 DIAGNOSIS — Z794 Long term (current) use of insulin: Secondary | ICD-10-CM | POA: Diagnosis not present

## 2020-11-24 DIAGNOSIS — E118 Type 2 diabetes mellitus with unspecified complications: Secondary | ICD-10-CM | POA: Diagnosis not present

## 2020-11-24 DIAGNOSIS — Z6839 Body mass index (BMI) 39.0-39.9, adult: Secondary | ICD-10-CM

## 2020-11-24 LAB — CORTISOL
Cortisol, Plasma: 9.1 ug/dL
Cortisol, Plasma: 21.8 ug/dL
Cortisol, Plasma: 28.4 ug/dL

## 2020-11-24 MED ORDER — COSYNTROPIN 0.25 MG IJ SOLR
0.2500 mg | Freq: Once | INTRAMUSCULAR | Status: AC
  Administered 2020-11-24: 0.25 mg via INTRAVENOUS

## 2020-11-24 NOTE — Progress Notes (Unsigned)
Corsyntropin injected into pt's left arm. Pt tolerated well. Lab tech advised of time for follow up blood draw.

## 2020-11-24 NOTE — Telephone Encounter (Signed)
I agree Please place a neurology referral Diagnosis possible lower extremity weakness possible neuropathy

## 2020-11-24 NOTE — Progress Notes (Signed)
Chief Complaint:   OBESITY Troy [name] is here to discuss his progress with his obesity treatment plan along with follow-up of his obesity related diagnoses. Troy [name] is on the Category 3 Plan and states he is following his eating plan approximately 50% of the time. Troy [name] states he is not exercising.  Today's visit was #: 22 Starting weight: 293 lbs Starting date: 09/07/2019 Today's weight: 269 lbs Today's date: 11/24/2020 Total lbs lost to date: 0 Total lbs lost since last in-office visit: 24 lbs  Interim History:  Vertigo 09/2020 COVID-19 positive on 10/11/2020. Yard work - syncopal collapse, taken to ED - CBG 160. 10/31/2020 at ENT, experienced dizziness with associated bilateral lower weakness. He estimates that he consumes 100-120 ounces of water/day plus 60-80 ounces of Gatorade Zero. His father experienced similar sx's and passed away at age 73 after a syncopal event.  Subjective:   1. Orthostatic dizzinesshypotension Ambulatory reading:  SBP 90-100, DBP 70-80 Vertigo 09/2020, COVID-19 positive on 10/11/2020. Yard work - syncopal collapse, taken to ED - CBG 160. On 10/31/2020 at ENT, experienced dizziness with associated bilateral lower weakness.  2. Type 2 diabetes mellitus with complication, with long-term current use of insulin ([place]) 11/18/20 Endo appt- metformin, SGLT2 inhibitor and also weekly GLP-1 receptor agonist along with the V-Go patch pump.  In the last year, he switched from working nights, which also helped.   Before last visit, he ran out of his VGo20 reservoirs and switched to VGo30, but sugars were controlled on this so we did not change it.  HbA1c checked 3 months ago was higher, though, at 8.7%, possibly related to epidural steroid injections.   -Since last visit, we had to switch from [place] to [place] per insurance preference. CGM interpretation: -At today's visit, we reviewed his CGM downloads: It appears that 87% of values are in target range (goal  >70%), while 30% are higher than 180 (goal <25%), and 0% are lower than 70 (goal <4%).  The calculated average blood sugar is 139.  The projected HbA1c for the next 3 months ([facility]) is 6.6%. -Reviewing the CGM trends, his sugars are slightly more fluctuating at today's visit compared to the last CGM download, with higher blood sugars after breakfast and also more variable blood sugars after lunch.  However, upon questioning, weeks and he did notice that sugars are more variable since.  At today's visit, I sent new prescription for Ozempic to his pharmacy and will restart this, but I do not feel that he needs a change in the regimen.  We did discuss about the possibility of using the CeQur device in the future. - I advised him to: Patient Instructions  Please continue: - Metformin XR 2000 mg at dinnertime - Jardiance 25 mg before b'fast  - [id number] 4-6 clicks per meal   Please restart: - Ozempic 1 mg weekly   Please return fasting, between 8 and 9 in the morning DXA to be here for 1 hour (to check your cortisol).   Please return in 4 months.   - we checked his HbA1c: 6.7% (improved) - advised to check sugars at different times of the day - 4x a day, rotating check times - advised for yearly eye exams >> he is not UTD - return to clinic in 4 months    -Cosyntropin stimulation test ordered- he has yet to complete.  Lab Results  Component Value Date   HGBA1C 6.7 (A) 11/18/2020   HGBA1C 8.2 (H) 09/06/2020   HGBA1C 6.7 (  H) 05/25/2020   Lab Results  Component Value Date   MICROALBUR 0.5 02/08/2020   LDLCALC 87 09/06/2020   CREATININE 0.94 10/31/2020   Lab Results  Component Value Date   INSULIN 7.9 09/06/2020   Assessment/Plan:   1. Orthostatic dizzinesshypotension Remain well hydrated. Follow-up with Cardiology. Complete cosyntropin stimulation test with Endocrinology.  2. Type 2 diabetes mellitus with complication, with long-term current use of insulin ([place]) Follow-up with  [facility]/Endo every 4 months.  3. Obesity, current BMI 38.7  Troy [name] is currently in the action stage of change. As such, his goal is to continue with weight loss efforts. He has agreed to the Category 3 Plan.   Exercise goals: No exercise has been prescribed at this time.  Do not overexert self until seen by Cardiology and complete Complete cosyntropin stimulation test with Endocrinology.  Behavioral modification strategies: increasing lean protein intake, decreasing simple carbohydrates, meal planning and cooking strategies, keeping healthy foods in the home, and planning for success.  Troy [name] has agreed to follow-up with our clinic in 4 weeks with Dr. Owens Shark. He was informed of the importance of frequent follow-up visits to maximize his success with intensive lifestyle modifications for his multiple health conditions.   Objective:   Blood pressure 109/76, pulse 77, temperature 98.2 F (36.8 C), height '5\' 10"'$  (1.778 m), weight 267 lb (121.1 kg), SpO2 98 %. Body mass index is 38.31 kg/m.  General: Cooperative, alert, well developed, in no acute distress. HEENT: Conjunctivae and lids unremarkable. Cardiovascular: Regular rhythm.  Lungs: Normal work of breathing. Neurologic: No focal deficits.   Lab Results  Component Value Date   CREATININE 0.94 10/31/2020   BUN 19 10/31/2020   NA 138 10/31/2020   K 4.1 10/31/2020   CL 99 10/31/2020   CO2 27 10/31/2020   Lab Results  Component Value Date   ALT 35 09/06/2020   AST 23 09/06/2020   ALKPHOS 75 09/06/2020   BILITOT 0.5 09/06/2020   Lab Results  Component Value Date   HGBA1C 6.7 (A) 11/18/2020   HGBA1C 8.2 (H) 09/06/2020   HGBA1C 6.7 (H) 05/25/2020   HGBA1C 5.9 (A) 11/30/2019   HGBA1C 6.4 (A) 07/27/2019   Lab Results  Component Value Date   INSULIN 7.9 09/06/2020   Lab Results  Component Value Date   TSH 1.32 04/27/2019   Lab Results  Component Value Date   CHOL 160 09/06/2020   HDL 36 (L) 09/06/2020   LDLCALC 87  09/06/2020   LDLDIRECT 125.0 12/26/2018   TRIG 220 (H) 09/06/2020   CHOLHDL 4.4 09/06/2020   Lab Results  Component Value Date   VD25OH 55.3 09/06/2020   VD25OH 63.0 06/13/2020   VD25OH 56.8 12/21/2019   Lab Results  Component Value Date   WBC 9.6 10/31/2020   HGB 16.3 10/31/2020   HCT 47.1 10/31/2020   MCV 94.4 10/31/2020   PLT 248 10/31/2020   Lab Results  Component Value Date   IRON 144 02/08/2020   TIBC 358 02/08/2020   FERRITIN 96 02/08/2020   Attestation Statements:   Reviewed by clinician on day of visit: allergies, medications, problem list, medical history, surgical history, family history, social history, and previous encounter notes.  Time spent on visit including pre-visit chart review and post-visit care and charting was 30 minutes.   I, Water quality scientist, CMA, am acting as Location manager for Mina Marble, NP.  I have reviewed the above documentation for accuracy and completeness, and I agree with the above. Valetta Fuller  d. Loleta Books, NP-C

## 2020-11-25 ENCOUNTER — Encounter: Payer: Self-pay | Admitting: Neurology

## 2020-11-26 LAB — ACTH: C206 ACTH: 42 pg/mL (ref 6–50)

## 2020-11-30 NOTE — Progress Notes (Signed)
Cardiology Office Note:    Date:  12/01/2020   ID:  Troy [name], DOB [birth year], MRN [id number]  PCP:  Burnis Medin, MD  Cardiologist:  None   Referring MD: Burnis Medin, MD   Chief Complaint  Patient presents with   Loss of Consciousness    History of Present Illness:    Troy [name] is a 50 y.o. male with a hx of orthostatic , DM II, OSA on CPAP, leg edema, and recent orthostatic dizziness/vertigo/syncope.  He had COVID in late July or early August.  On August 13, he was trying to mow his grass and became very weak, pale, and had to retire inside his house.  Later that same afternoon he got up to go into his kitchen and "passed out on the counter".  He does not remember some things that happened.  He also cannot explain how he eventually got back to the couch.  When his wife found him, several things in the kitchen had been turned over/were in disarray.  Since that time he has had episodes of severe dizziness.  He has been seen and evaluated for the possibility of vertigo by ENT.  They could not confirm that he was having vertigo and recommended that he undergo a cardiac and neurological evaluation.  Since the severe episode in mid August, he has had no recurrence of similar nature.  He does state that he feels transiently weak when he stands after sitting or lying.  He denies buzzing and ringing in his ears.  He denies chest pain, orthopnea, PND.  He has been making a conscious effort to hydrate.  No prior history of fainting.  He is diabetic for greater than 15 years.  Control has been marginal.  Past Medical History:  Diagnosis Date   Arm weakness    right    Asthma    B12 deficiency    Back pain    Complication of anesthesia    "as a teenager , as they were waken up, I was shaking all over- they called it convulsions -it has never happen again"   Diabetes insipidus ([place])    Diabetes mellitus without complication ([place])    TYPE II   DVT (deep venous thrombosis)  ([place])    right leg last time 2020   Leg edema, right    Male infertility    Neck injury 12/22/2013   Neuropathy    Obesity    Obesity    OSA (obstructive sleep apnea)    CPAP    Past Surgical History:  Procedure Laterality Date   ANTERIOR CERVICAL DECOMP/DISCECTOMY FUSION N/A 05/25/2020   Procedure: ANTERIOR CERVICAL DECOMPRESSION/DISCECTOMY FUSION, INTERBODY PROSTHESIS, PLATE/SCREWS CERVICAL FIVE- CERVICAL SIX;  Surgeon: Newman Pies, MD;  Location: [facility];  Service: Neurosurgery;  Laterality: N/A;   BACK SURGERY     CERVICAL DISC SURGERY     C6/C7 2009   cubital tunnel left arm     2003   ELBOW SURGERY     FIBULA FRACTURE SURGERY     plate & pin removed due to infection 1997   ULNAR NERVE REPAIR      Current Medications: Current Meds  Medication Sig   albuterol (PROVENTIL HFA;VENTOLIN HFA) 108 (90 Base) MCG/ACT inhaler Inhale 2 puffs into the lungs every 6 (six) hours as needed. For wheezing   apixaban (ELIQUIS) 5 MG TABS tablet TAKE 1 TABLET(5 MG) BY MOUTH TWICE DAILY   atorvastatin (LIPITOR) 40 MG tablet TAKE 1 TABLET  DAILY   cholecalciferol (VITAMIN D3) 25 MCG (1000 UNIT) tablet Take 1,000 Units by mouth daily.   Continuous Blood Gluc Sensor (FREESTYLE LIBRE 14 DAY SENSOR) MISC CHANGE EVERY 14 DAYS AS DIRECTED, CHANGE EVERY 2 WEEKS   Cyanocobalamin (B-12) 5000 MCG CAPS Take 5,000 mcg by mouth daily.   empagliflozin (JARDIANCE) 25 MG TABS tablet Take 1 tablet (25 mg total) by mouth daily before breakfast.   Insulin Disposable Pump (V-GO 30) KIT Use 1 a day   insulin lispro (HUMALOG) 100 UNIT/ML injection INJECT UP TO 80 UNITS DAILY IN VGO.   metFORMIN (GLUCOPHAGE-XR) 500 MG 24 hr tablet Take 4 tablets (2,000 mg total) by mouth daily.   Semaglutide, 1 MG/DOSE, 4 MG/3ML SOPN Inject 1 mg as directed once a week.     Allergies:   Patient has no known allergies.   Social History   Socioeconomic History   Marital status: Married    Spouse name: Juliann Pulse   Number of  children: 1   Years of education: Not on file   Highest education level: Not on file  Occupational History   Occupation: Customer Service Lead  Tobacco Use   Smoking status: Never   Smokeless tobacco: Never  Vaping Use   Vaping Use: Never used  Substance and Sexual Activity   Alcohol use: Yes    Comment: 1 - 2 a year   Drug use: No   Sexual activity: Yes  Other Topics Concern   Not on file  Social History Narrative   Occupation:  [name]   Worked 11- 8 am  Now changed job and shift to [employer] through Thursday    Working  Ft 40 hours mostly   Married '10 10 10   ' Wife s/p bariatric surgery pt   Regular exercise- yes   Pt does have children   Father died suddenly 04/30/2008   Mom passes 2017  Acute rep disease after acute renal failure   Daily caffeine use one a day   2 dogs and cat.             Social Determinants of Health   Financial Resource Strain: Not on file  Food Insecurity: Not on file  Transportation Needs: Not on file  Physical Activity: Not on file  Stress: Not on file  Social Connections: Not on file     Family History: The patient's family history includes Alcoholism in his father; Allergies in an other family member; Aneurysm in his father; Deep vein thrombosis in an other family member; Depression in his mother; Diabetes in his mother; Heart disease in his mother; High blood pressure in his father and mother; Hyperlipidemia in his father; Hypertension in an other family member; Kidney disease in his mother; Obesity in his mother and another family member; Other in his father and mother; Sleep apnea in his mother and another family member; Sudden death in his father.  ROS:   Please see the history of present illness.    He is overweight, relatively deconditioned, goes [facility] the [facility].  He does not smoke cigarettes.  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  Recently had anterior cervical spine surgery.  This was performed because he was having weakness in his legs.  He feels that some of the above episodes are associated with leg weakness similar to what he had prior  to surgery.  All other systems reviewed and are negative.  EKGs/Labs/Other Studies Reviewed:    The following studies were reviewed today: 2D Doppler echocardiogram 11/11/2020: IMPRESSIONS     1. Left ventricular ejection fraction, by estimation, is 55 to 60%. The  left ventricle has normal function. The left ventricle has no regional  wall motion abnormalities. There is mild left ventricular hypertrophy.  Left ventricular diastolic parameters  are consistent with Grade I diastolic dysfunction (impaired relaxation).   2. Right ventricular systolic function is normal. The right ventricular  size is normal.   3. The mitral valve is normal in structure. No evidence of mitral valve  regurgitation. No evidence of mitral stenosis.   4. The aortic valve is normal in structure. Aortic valve regurgitation is  not visualized. No aortic stenosis is present.   5. The inferior vena cava is normal in size with greater than 50%  respiratory variability, suggesting right atrial pressure of 3 mmHg.    EKG:  EKG performed on November 02, 2020 demonstrates normal sinus rhythm with heart rate 96 bpm and normal EKG appearance.  Recent Labs: 09/06/2020: ALT 35 10/31/2020: BUN 19; Creatinine, Ser 0.94; Hemoglobin 16.3; Platelets 248; Potassium 4.1; Sodium 138  Recent Lipid Panel    Component Value Date/Time   CHOL 160 09/06/2020 0744   TRIG 220 (H) 09/06/2020 0744   HDL 36 (L) 09/06/2020 0744   CHOLHDL 4.4 09/06/2020 0744   CHOLHDL 3.5 02/08/2020 1102   VLDL 52.8 (H) 12/26/2018 1052   LDLCALC 87 09/06/2020 0744   LDLCALC 74 02/08/2020 1102   LDLDIRECT 125.0 12/26/2018 1052    Physical Exam:    VS:  BP 98/68   Pulse (!) 102   Ht '5\' 10"'  (1.778 m)   Wt  276 lb 12.8 oz (125.6 kg)   SpO2 97%   BMI 39.72 kg/m     Wt Readings from Last 3 Encounters:  12/01/20 276 lb 12.8 oz (125.6 kg)  11/24/20 267 lb (121.1 kg)  11/18/20 278 lb 6.4 oz (126.3 kg)    Orthostatic blood pressure check: Lying 130/80 mmHg heart rate 88 bpm Sitting 152/84 mmHg heart rate 96 bpm Standing 144 over 80 bpm heart rate 104 bpm  GEN: Obese. No acute distress HEENT: Normal NECK: No JVD. LYMPHATICS: No lymphadenopathy CARDIAC: No murmur. RRR no gallop, but with 1-2+ right ankle and foot edema.  The right lower extremity is wrapped. VASCULAR:  Normal Pulses. No bruits. RESPIRATORY:  Clear to auscultation without rales, wheezing or rhonchi  ABDOMEN: Soft, non-tender, non-distended, No pulsatile mass, MUSCULOSKELETAL: No deformity  SKIN: Warm and dry NEUROLOGIC:  Alert and oriented x 3 PSYCHIATRIC:  Normal affect   ASSESSMENT:    1. Orthostatic dizzinesshypotension   2. Type 2 diabetes mellitus with complication, with long-term current use of insulin ([name])   3. Class 2 severe obesity due to excess calories with serious comorbidity and body mass index (BMI) of 39.0 to 39.9 in adult ([place])   4. Obstructive sleep apnea   5. Mixed hyperlipidemia   6. Acute deep vein thrombosis (DVT) of femoral vein of right lower extremity ([name])   7. Syncope, unspecified syncope type    PLAN:    In order of problems listed above:  Orthostasis is not demonstrated today.  The patient has generally been hydrating better than prior to the event in August.  I find no evidence of diabetic autonomic neuropathy based upon clinical exam is as appropriate heart rate rise with standing  and the blood pressure does not significantly decrease.  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  He will need to have a 30-day monitor to exclude arrhythmia.  Heart is structurally normal with the exception of mild left ventricular hypertrophy which is presumed secondary to  obesity.  He is on no medications that could contribute to hypotension/orthostasis except Jardiance which can cause a glucose urea and potentially volume contraction.  He is not on antihypertensive therapy or diuretic therapy otherwise.  Relative to this particular problem I have recommended a left compression stocking of moderate tension, liberalize fluid intake, stand for 5 to 10 seconds before walking to ensure he does not faint and injured himself.  He has had a negative work-up for adrenal insufficiency.  We may not be able to do much more than symptomatic management.  Needs to exclude a neurogenic cause related to cervical spine compression since he has recently had a cervical decompression.  (Sorry for the prolonged expose, but I do not have a definite diagnosis). Most recent hemoglobin A1c 6.7 which is reasonable control. Being managed in the weight loss center at [facility] with CPAP LDL should be less than 70.  Continue Lipitor 40 mg/day. Continue chronic long-term Eliquis therapy.   Medication Adjustments/Labs and Tests Ordered: Current medicines are reviewed at length with the patient today.  Concerns regarding medicines are outlined above.  Orders Placed This Encounter  Procedures   Cardiac event monitor   No orders of the defined types were placed in this encounter.   Patient Instructions  Medication Instructions:  Your physician recommends that you continue on your current medications as directed. Please refer to the Current Medication list given to you today.  *If you need a refill on your cardiac medications before your next appointment, please call your pharmacy*   Lab Work: None If you have labs (blood work) drawn today and your tests are completely normal, you will receive your results only by: [place] (if you have MyChart) OR A paper copy in the mail If you have any lab test that is abnormal or we need to change your treatment, we will call you to  review the results.   Testing/Procedures: Your physician has recommended that you wear an event monitor. Event monitors are medical devices that record the heart's electrical activity. Doctors most often [name] these monitors to diagnose arrhythmias. Arrhythmias are problems with the speed or rhythm of the heartbeat. The monitor is a small, portable device. You can wear one while you do your normal daily activities. This is usually used to diagnose what is causing palpitations/syncope (passing out).   Follow-Up: At [facility], you and your health needs are our priority.  As part of our continuing mission to provide you with exceptional heart care, we have created designated Provider Care Teams.  These Care Teams include your primary Cardiologist (physician) and Advanced Practice Providers (APPs -  Physician Assistants and Nurse Practitioners) who all work together to provide you with the care you need, when you need it.  We recommend signing up for the patient portal called "MyChart".  Sign up information is provided on this After Visit Summary.  MyChart is used to connect with patients for Virtual Visits (Telemedicine).  Patients are able to view lab/test results, encounter notes, upcoming appointments, etc.  Non-urgent messages can be sent to your provider as well.   To learn more about what you can do with MyChart, go to [email or web address].  Your next appointment:   As needed  The format for your next appointment:   In Person  Provider:   You may see Daneen Schick, MD or one of the following Advanced Practice Providers on your designated Care Team:   Cecilie Kicks, NP   Other Instructions  [employer] Cardiac Event Monitor Instructions Your physician has requested you wear your cardiac event monitor for 30 days. [employer] may call or text to confirm a shipping address. The monitor will be sent to a land address via UPS. [employer] will not ship a monitor to a PO BOX. It typically  takes 3-5 days to receive your monitor after it has been enrolled. [employer] will assist with USPS tracking if your package is delayed. The telephone number for [employer] is [phone]. Once you have received your monitor, please review the enclosed instructions. Instruction tutorials can also be viewed under help and settings on the enclosed cell phone. Your monitor has already been registered assigning a specific monitor serial # to you.  Applying the monitor Remove cell phone from case and turn it on. The cell phone works as Dealer and needs to be within [employer] of you at all times. The cell phone will need to be charged on a daily basis. We recommend you plug the cell phone into the enclosed charger at your bedside table every night.  Monitor batteries: You will receive two monitor batteries labelled #1 and #2. These are your recorders. Plug battery #2 onto the second connection on the enclosed charger. Keep one battery on the charger at all times. This will keep the monitor battery deactivated. It will also keep it fully charged for when you need to switch your monitor batteries. A small light will be blinking on the battery emblem when it is charging. The light on the battery emblem will remain on when the battery is fully charged.  Open package of a Monitor strip. Insert battery #1 into black hood on strip and gently squeeze monitor battery onto connection as indicated in instruction booklet. Set aside while preparing skin.  Choose location for your strip, vertical or horizontal, as indicated in the instruction booklet. Shave to remove all hair from location. There cannot be any lotions, oils, powders, or colognes on skin where monitor is to be applied. Wipe skin clean with enclosed Saline wipe. Dry skin completely.  Peel paper labeled #1 off the back of the Monitor strip exposing the adhesive. Place the monitor on the chest in the vertical or horizontal position  shown in the instruction booklet. One arrow on the monitor strip must be pointing upward. Carefully remove paper labeled #2, attaching remainder of strip to your skin. Try not to create any folds or wrinkles in the strip as you apply it.  Firmly press and release the circle in the center of the monitor battery. You will hear a small beep. This is turning the monitor battery on. The heart emblem on the monitor battery will light up every 5 seconds if the monitor battery in turned on and connected to the patient securely. Do not push and hold the circle down as this turns the monitor battery off. The cell phone will locate the monitor battery. A screen will appear on the cell phone checking the connection of your monitor strip. This may read poor connection initially but change to good connection within the next minute. Once your monitor accepts the connection you will hear a series of 3 beeps followed by a climbing crescendo of  beeps. A screen will appear on the cell phone showing the two monitor strip placement options. Touch the picture that demonstrates where you applied the monitor strip.  Your monitor strip and battery are waterproof. You are able to shower, bathe, or swim with the monitor on. They just ask you do not submerge deeper than 3 feet underwater. We recommend removing the monitor if you are swimming in a lake, river, or ocean.  Your monitor battery will need to be switched to a fully charged monitor battery approximately once a week. The cell phone will alert you of an action which needs to be made.  On the cell phone, tap for details to reveal connection status, monitor battery status, and cell phone battery status. The green dots indicates your monitor is in good status. A red dot indicates there is something that needs your attention.  To record a symptom, click the circle on the monitor battery. In 30-60 seconds a list of symptoms will appear on the cell phone. Select your  symptom and tap save. Your monitor will record a sustained or significant arrhythmia regardless of you clicking the button. Some patients do not feel the heart rhythm irregularities. [employer] will notify us of any serious or critical events.  Refer to instruction booklet for instructions on switching batteries, changing strips, the Do not disturb or Pause features, or any additional questions.  Call [employer] at [phone], to confirm your monitor is transmitting and record your baseline. They will answer any questions you may have regarding the monitor instructions at that time.  Returning the monitor to [place] all equipment back into blue box. Peel off strip of paper to expose adhesive and close box securely. There is a prepaid UPS shipping label on this box. Drop in a UPS drop box, or at a UPS facility like [employer]. You may also contact [employer] to arrange UPS to pick up monitor package at your home.     Signed, Sinclair Grooms, MD  12/01/2020 5:03 PM    [facility]

## 2020-12-01 ENCOUNTER — Other Ambulatory Visit: Payer: Self-pay

## 2020-12-01 ENCOUNTER — Encounter: Payer: Self-pay | Admitting: *Deleted

## 2020-12-01 ENCOUNTER — Encounter: Payer: Self-pay | Admitting: Interventional Cardiology

## 2020-12-01 ENCOUNTER — Ambulatory Visit (INDEPENDENT_AMBULATORY_CARE_PROVIDER_SITE_OTHER): Payer: BC Managed Care – PPO | Admitting: Interventional Cardiology

## 2020-12-01 VITALS — BP 98/68 | HR 102 | Ht 70.0 in | Wt 276.8 lb

## 2020-12-01 DIAGNOSIS — R42 Dizziness and giddiness: Secondary | ICD-10-CM

## 2020-12-01 DIAGNOSIS — Z794 Long term (current) use of insulin: Secondary | ICD-10-CM

## 2020-12-01 DIAGNOSIS — G4733 Obstructive sleep apnea (adult) (pediatric): Secondary | ICD-10-CM

## 2020-12-01 DIAGNOSIS — I82411 Acute embolism and thrombosis of right femoral vein: Secondary | ICD-10-CM

## 2020-12-01 DIAGNOSIS — E118 Type 2 diabetes mellitus with unspecified complications: Secondary | ICD-10-CM

## 2020-12-01 DIAGNOSIS — Z6839 Body mass index (BMI) 39.0-39.9, adult: Secondary | ICD-10-CM

## 2020-12-01 DIAGNOSIS — R55 Syncope and collapse: Secondary | ICD-10-CM

## 2020-12-01 DIAGNOSIS — E782 Mixed hyperlipidemia: Secondary | ICD-10-CM

## 2020-12-01 NOTE — Progress Notes (Signed)
Patient ID: Troy [name], male   DOB: 06/27/1970, 50 y.o.   MRN: [id number] Patient enrolled for [employer] to ship a 30 day cardiac event monitor to his address on file.

## 2020-12-01 NOTE — Patient Instructions (Addendum)
Medication Instructions:  Your physician recommends that you continue on your current medications as directed. Please refer to the Current Medication list given to you today.  *If you need a refill on your cardiac medications before your next appointment, please call your pharmacy*   Lab Work: None If you have labs (blood work) drawn today and your tests are completely normal, you will receive your results only by: [place] (if you have MyChart) OR A paper copy in the mail If you have any lab test that is abnormal or we need to change your treatment, we will call you to review the results.   Testing/Procedures: Your physician has recommended that you wear an event monitor. Event monitors are medical devices that record the heart's electrical activity. Doctors most often [name] these monitors to diagnose arrhythmias. Arrhythmias are problems with the speed or rhythm of the heartbeat. The monitor is a small, portable device. You can wear one while you do your normal daily activities. This is usually used to diagnose what is causing palpitations/syncope (passing out).   Follow-Up: At [facility], you and your health needs are our priority.  As part of our continuing mission to provide you with exceptional heart care, we have created designated Provider Care Teams.  These Care Teams include your primary Cardiologist (physician) and Advanced Practice Providers (APPs -  Physician Assistants and Nurse Practitioners) who all work together to provide you with the care you need, when you need it.  We recommend signing up for the patient portal called "MyChart".  Sign up information is provided on this After Visit Summary.  MyChart is used to connect with patients for Virtual Visits (Telemedicine).  Patients are able to view lab/test results, encounter notes, upcoming appointments, etc.  Non-urgent messages can be sent to your provider as well.   To learn more about what you can do with MyChart, go  to [email or web address].    Your next appointment:   As needed  The format for your next appointment:   In Person  Provider:   You may see Daneen Schick, MD or one of the following Advanced Practice Providers on your designated Care Team:   Cecilie Kicks, NP   Other Instructions  [employer] Cardiac Event Monitor Instructions Your physician has requested you wear your cardiac event monitor for 30 days. [employer] may call or text to confirm a shipping address. The monitor will be sent to a land address via UPS. [employer] will not ship a monitor to a PO BOX. It typically takes 3-5 days to receive your monitor after it has been enrolled. [employer] will assist with USPS tracking if your package is delayed. The telephone number for [employer] is [phone]. Once you have received your monitor, please review the enclosed instructions. Instruction tutorials can also be viewed under help and settings on the enclosed cell phone. Your monitor has already been registered assigning a specific monitor serial # to you.  Applying the monitor Remove cell phone from case and turn it on. The cell phone works as Dealer and needs to be within [employer] of you at all times. The cell phone will need to be charged on a daily basis. We recommend you plug the cell phone into the enclosed charger at your bedside table every night.  Monitor batteries: You will receive two monitor batteries labelled #1 and #2. These are your recorders. Plug battery #2 onto the second connection on the enclosed charger. Keep one battery on the charger at all times.  This will keep the monitor battery deactivated. It will also keep it fully charged for when you need to switch your monitor batteries. A small light will be blinking on the battery emblem when it is charging. The light on the battery emblem will remain on when the battery is fully charged.  Open package of a Monitor strip. Insert battery #1 into black  hood on strip and gently squeeze monitor battery onto connection as indicated in instruction booklet. Set aside while preparing skin.  Choose location for your strip, vertical or horizontal, as indicated in the instruction booklet. Shave to remove all hair from location. There cannot be any lotions, oils, powders, or colognes on skin where monitor is to be applied. Wipe skin clean with enclosed Saline wipe. Dry skin completely.  Peel paper labeled #1 off the back of the Monitor strip exposing the adhesive. Place the monitor on the chest in the vertical or horizontal position shown in the instruction booklet. One arrow on the monitor strip must be pointing upward. Carefully remove paper labeled #2, attaching remainder of strip to your skin. Try not to create any folds or wrinkles in the strip as you apply it.  Firmly press and release the circle in the center of the monitor battery. You will hear a small beep. This is turning the monitor battery on. The heart emblem on the monitor battery will light up every 5 seconds if the monitor battery in turned on and connected to the patient securely. Do not push and hold the circle down as this turns the monitor battery off. The cell phone will locate the monitor battery. A screen will appear on the cell phone checking the connection of your monitor strip. This may read poor connection initially but change to good connection within the next minute. Once your monitor accepts the connection you will hear a series of 3 beeps followed by a climbing crescendo of beeps. A screen will appear on the cell phone showing the two monitor strip placement options. Touch the picture that demonstrates where you applied the monitor strip.  Your monitor strip and battery are waterproof. You are able to shower, bathe, or swim with the monitor on. They just ask you do not submerge deeper than 3 feet underwater. We recommend removing the monitor if you are swimming in a  lake, river, or ocean.  Your monitor battery will need to be switched to a fully charged monitor battery approximately once a week. The cell phone will alert you of an action which needs to be made.  On the cell phone, tap for details to reveal connection status, monitor battery status, and cell phone battery status. The green dots indicates your monitor is in good status. A red dot indicates there is something that needs your attention.  To record a symptom, click the circle on the monitor battery. In 30-60 seconds a list of symptoms will appear on the cell phone. Select your symptom and tap save. Your monitor will record a sustained or significant arrhythmia regardless of you clicking the button. Some patients do not feel the heart rhythm irregularities. [employer] will notify us of any serious or critical events.  Refer to instruction booklet for instructions on switching batteries, changing strips, the Do not disturb or Pause features, or any additional questions.  Call [employer] at [phone], to confirm your monitor is transmitting and record your baseline. They will answer any questions you may have regarding the monitor instructions at that time.  Returning the monitor  to [employer] Place all equipment back into blue box. Peel off strip of paper to expose adhesive and close box securely. There is a prepaid UPS shipping label on this box. Drop in a UPS drop box, or at a UPS facility like [employer]. You may also contact [employer] to arrange UPS to pick up monitor package at your home.

## 2020-12-08 DIAGNOSIS — G4733 Obstructive sleep apnea (adult) (pediatric): Secondary | ICD-10-CM | POA: Diagnosis not present

## 2020-12-09 ENCOUNTER — Ambulatory Visit: Payer: BC Managed Care – PPO | Admitting: Internal Medicine

## 2020-12-10 ENCOUNTER — Ambulatory Visit (INDEPENDENT_AMBULATORY_CARE_PROVIDER_SITE_OTHER): Payer: BC Managed Care – PPO

## 2020-12-10 DIAGNOSIS — R55 Syncope and collapse: Secondary | ICD-10-CM

## 2020-12-22 ENCOUNTER — Ambulatory Visit (INDEPENDENT_AMBULATORY_CARE_PROVIDER_SITE_OTHER): Payer: BC Managed Care – PPO | Admitting: Bariatrics

## 2021-01-07 DIAGNOSIS — G4733 Obstructive sleep apnea (adult) (pediatric): Secondary | ICD-10-CM | POA: Diagnosis not present

## 2021-01-13 DIAGNOSIS — G4733 Obstructive sleep apnea (adult) (pediatric): Secondary | ICD-10-CM | POA: Diagnosis not present

## 2021-01-16 ENCOUNTER — Ambulatory Visit (INDEPENDENT_AMBULATORY_CARE_PROVIDER_SITE_OTHER): Payer: BC Managed Care – PPO | Admitting: Bariatrics

## 2021-01-16 ENCOUNTER — Other Ambulatory Visit: Payer: Self-pay

## 2021-01-16 ENCOUNTER — Encounter (INDEPENDENT_AMBULATORY_CARE_PROVIDER_SITE_OTHER): Payer: Self-pay | Admitting: Bariatrics

## 2021-01-16 VITALS — BP 102/68 | Temp 98.1°F | Ht 70.0 in | Wt 275.0 lb

## 2021-01-16 DIAGNOSIS — E1169 Type 2 diabetes mellitus with other specified complication: Secondary | ICD-10-CM | POA: Diagnosis not present

## 2021-01-16 DIAGNOSIS — D582 Other hemoglobinopathies: Secondary | ICD-10-CM | POA: Insufficient documentation

## 2021-01-16 DIAGNOSIS — Z794 Long term (current) use of insulin: Secondary | ICD-10-CM

## 2021-01-16 DIAGNOSIS — Z6839 Body mass index (BMI) 39.0-39.9, adult: Secondary | ICD-10-CM

## 2021-01-16 DIAGNOSIS — E118 Type 2 diabetes mellitus with unspecified complications: Secondary | ICD-10-CM | POA: Diagnosis not present

## 2021-01-16 DIAGNOSIS — E785 Hyperlipidemia, unspecified: Secondary | ICD-10-CM

## 2021-01-16 NOTE — Progress Notes (Signed)
Chief Complaint:   OBESITY Troy [name] is here to discuss his progress with his obesity treatment plan along with follow-up of his obesity related diagnoses. Troy [name] is on the Category 3 Plan and states he is following his eating plan approximately 40% of the time. Troy [name] states he is using bands for 15 minutes 3 times per week.  Today's visit was #: 23 Starting weight: 293 lbs Starting date: 09/07/2019 Today's weight: 275 lbs Today's date: 01/16/2021 Total lbs lost to date: 18 lbs Total lbs lost since last in-office visit: 0  Interim History: Troy [name] is up 8 lbs since his last visit. His last visit  was on 11/24/2020. 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   Subjective:   1. Type 2 diabetes mellitus with complication, with long-term current use of insulin ([name]) Troy [name] is currently taking Jardiance, Metformin, Humalog and Ozempic. His last A1C level was 6.7.  2. Hyperlipidemia associated with type 2 diabetes mellitus ([place]) Troy [name] is taking Lipitor currently.   Assessment/Plan:   1. Type 2 diabetes mellitus with complication, with long-term current use of insulin ([name]) Troy [name] will continue Metformin. He will decrease carbohydrates. Good blood sugar control is important to decrease the likelihood of diabetic complications such as nephropathy, neuropathy, limb loss, blindness, coronary artery disease, and death. Intensive lifestyle modification including diet, exercise and weight loss are the first line of treatment for diabetes.   2. Hyperlipidemia associated with type 2 diabetes mellitus ([place]) Cardiovascular risk and specific lipid/LDL goals reviewed.  We discussed several lifestyle modifications today and Troy [name] will continue to work on diet, exercise and weight loss efforts. Troy [name] will continue Lipitor. Orders and follow up as documented in patient record.    Counseling Intensive lifestyle modifications are the first line treatment for this issue. Dietary changes: Increase soluble fiber. Decrease simple carbohydrates. Exercise changes: Moderate to vigorous-intensity aerobic activity 150 minutes per week if tolerated. Lipid-lowering medications: see documented in medical record.   3. Obesity, current BMI 39.5 Troy [name] is currently in the action stage of change. As such, his goal is to continue with weight loss efforts. He has agreed to the Category 3 Plan.   Troy [name] will continue meal planning and intentional eating. He will not skip meals. He will continue to drink adequate water.   Exercise goals:  Troy [name] will continue with band and exercise. He will talk with his cardiologist.  Behavioral modification strategies: increasing lean protein intake, decreasing simple carbohydrates, increasing vegetables, increasing water intake, decreasing eating out, no skipping meals, meal planning and cooking strategies, keeping healthy foods in the home, and planning for success.  Troy [name] has agreed to follow-up with our clinic in 2-3 weeks with Mina Marble, NP. He was informed of the importance of frequent follow-up visits to maximize his success with intensive lifestyle modifications for his multiple health conditions.   Objective:   Blood pressure 102/68, temperature 98.1 F (36.7 C), height 5\' 10"  (1.778 m), weight 275 lb (124.7 kg). Body mass index is 39.46 kg/m.  General: Cooperative, alert, well developed, in no acute distress. HEENT: Conjunctivae and lids unremarkable. Cardiovascular: Regular rhythm.  Lungs: Normal work of breathing. Neurologic: No focal deficits.   Lab Results  Component Value Date   CREATININE 0.94 10/31/2020   BUN 19 10/31/2020   NA 138 10/31/2020   K 4.1 10/31/2020   CL 99 10/31/2020   CO2 27  10/31/2020   Lab Results  Component Value Date   ALT 35 09/06/2020   AST 23 09/06/2020   ALKPHOS 75 09/06/2020    BILITOT 0.5 09/06/2020   Lab Results  Component Value Date   HGBA1C 6.7 (A) 11/18/2020   HGBA1C 8.2 (H) 09/06/2020   HGBA1C 6.7 (H) 05/25/2020   HGBA1C 5.9 (A) 11/30/2019   HGBA1C 6.4 (A) 07/27/2019   Lab Results  Component Value Date   INSULIN 7.9 09/06/2020   Lab Results  Component Value Date   TSH 1.32 04/27/2019   Lab Results  Component Value Date   CHOL 160 09/06/2020   HDL 36 (L) 09/06/2020   LDLCALC 87 09/06/2020   LDLDIRECT 125.0 12/26/2018   TRIG 220 (H) 09/06/2020   CHOLHDL 4.4 09/06/2020   Lab Results  Component Value Date   VD25OH 55.3 09/06/2020   VD25OH 63.0 06/13/2020   VD25OH 56.8 12/21/2019   Lab Results  Component Value Date   WBC 9.6 10/31/2020   HGB 16.3 10/31/2020   HCT 47.1 10/31/2020   MCV 94.4 10/31/2020   PLT 248 10/31/2020   Lab Results  Component Value Date   IRON 144 02/08/2020   TIBC 358 02/08/2020   FERRITIN 96 02/08/2020   Attestation Statements:   Reviewed by clinician on day of visit: allergies, medications, problem list, medical history, surgical history, family history, social history, and previous encounter notes.   I, Lizbeth Bark, RMA, am acting as Location manager for [employer], DO.   I have reviewed the above documentation for accuracy and completeness, and I agree with the above. Jearld Lesch, DO

## 2021-01-29 NOTE — Progress Notes (Signed)
Chief Complaint  Patient presents with   Annual Exam     HPI: Patient  Troy [name]  50 y.o. comes in today for Preventive Health Care visit  Still stress when  physically active .   Exercise intolerance .  Under evaluation for low blood pressure Had  covid  august   vertigo is better .  Diabetes under control sees endocrinology Right lower extremity after using lymphedema brace leg is much better seems to be quite helpful.  Neuropathy pain had improved after treatment could go off Lyrica. Concern Father and other relatives had a AAA they did smoke.  No symptoms. 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 Has a bump in the anal area that is getting less thinks it could be a hemorrhoid but not sure wife is worried please check.  He is up-to-date on colonoscopy. HLD taking med    Health Maintenance  Topic Date Due   Hepatitis C Screening  Never done   Zoster Vaccines- Shingrix (1 of 2) Never done   OPHTHALMOLOGY EXAM  12/19/2015   FOOT EXAM  10/25/2016   COVID-19 Vaccine (4 - Booster for Pfizer series) 02/28/2020   Pneumococcal Vaccine 75-42 Years old (3 - PPSV23 if available, else PCV20) 10/25/2020   URINE MICROALBUMIN  02/07/2021   HEMOGLOBIN A1C  05/18/2021   COLONOSCOPY (Pts 45-96yr Insurance coverage will need to be confirmed)  04/15/2023   TETANUS/TDAP  07/23/2027   INFLUENZA VACCINE  Completed   HIV Screening  Completed   HPV VACCINES  Aged Out   Health Maintenance Review LIFESTYLE:  Exercise:  walking some   still stress Tobacco/ETS: no Alcohol:   rare to none  Sugar beverages:  none  Sleep:  6 +hours interrupted  .   Drug use: no HH of   3  3 dog 2 cats  too many   Work:in office .    40 per week as well as wife  ROS:  REST of 12 system review negative except as per HPI   Past Medical History:  Diagnosis Date   Arm weakness    right    Asthma    B12 deficiency    Back pain    Complication of anesthesia    "as a  teenager , as they were waken up, I was shaking all over- they called it convulsions -it has never happen again"   Diabetes insipidus ([facility]    Diabetes mellitus without complication ([facility]    TYPE II   DVT (deep venous thrombosis) ([facility]    right leg last time 2020   Leg edema, right    Male infertility    Neck injury 12/22/2013   Neuropathy    Obesity    Obesity    OSA (obstructive sleep apnea)    CPAP    Past Surgical History:  Procedure Laterality Date   ANTERIOR CERVICAL DECOMP/DISCECTOMY FUSION N/A 05/25/2020   Procedure: ANTERIOR CERVICAL DECOMPRESSION/DISCECTOMY FUSION, INTERBODY PROSTHESIS, PLATE/SCREWS CERVICAL FIVE- CERVICAL SIX;  Surgeon: JNewman Pies MD;  [facility]  Service: Neurosurgery;  Laterality: N/A;   BACK SURGERY     CERVICAL DISC SURGERY     C6/C7 2009   cubital tunnel left arm     2003   ELBOW SURGERY     FIBULA FRACTURE SURGERY     plate & pin removed due to infection 1Boon     Family History  Problem Relation Age  of Onset   Heart disease Mother    Other Mother        clotting disorders   Diabetes Mother    High blood pressure Mother    Kidney disease Mother    Depression Mother    Sleep apnea Mother    Obesity Mother    Other Father        sudden death/cervical and lumbar disk disease   Aneurysm Father        In his 87s smoked   Hyperlipidemia Father    High blood pressure Father    Sudden death Father    Alcoholism Father    Hypertension Other    Allergies Other    Deep vein thrombosis Other    Sleep apnea Other    Obesity Other     Social History   Socioeconomic History   Marital status: Married    Spouse name: Juliann Pulse   Number of children: 1   Years of education: Not on file   Highest education level: Not on file  Occupational History   Occupation: Customer Service Lead  Tobacco Use   Smoking status: Never   Smokeless tobacco: Never  Vaping Use   Vaping Use: Never used  Substance and Sexual  Activity   Alcohol use: Yes    Comment: 1 - 2 a year   Drug use: No   Sexual activity: Yes  Other Topics Concern   Not on file  Social History Narrative   Occupation:  days [name]   Worked 11- 8 am  Now changed job and shift to [employer] through Thursday    Working  Ft 40 hours mostly   Married '10 10 10   ' Wife s/p bariatric surgery pt   Regular exercise- yes   Pt does have children   Father died suddenly 2008/05/10   Mom passes 2017  Acute rep disease after acute renal failure   Daily caffeine use one a day   2 dogs and cat.             Social Determinants of Health   Financial Resource Strain: Not on file  Food Insecurity: Not on file  Transportation Needs: Not on file  Physical Activity: Not on file  Stress: Not on file  Social Connections: Not on file    Outpatient Medications Prior to Visit  Medication Sig Dispense Refill   albuterol (PROVENTIL HFA;VENTOLIN HFA) 108 (90 Base) MCG/ACT inhaler Inhale 2 puffs into the lungs every 6 (six) hours as needed. For wheezing 1 Inhaler 2   apixaban (ELIQUIS) 5 MG TABS tablet TAKE 1 TABLET(5 MG) BY MOUTH TWICE DAILY 180 tablet 1   atorvastatin (LIPITOR) 40 MG tablet TAKE 1 TABLET DAILY 90 tablet 1   cholecalciferol (VITAMIN D3) 25 MCG (1000 UNIT) tablet Take 1,000 Units by mouth daily.     Continuous Blood Gluc Sensor (FREESTYLE LIBRE 14 DAY SENSOR) MISC CHANGE EVERY 14 DAYS AS DIRECTED, CHANGE EVERY 2 WEEKS 4 each 1   Cyanocobalamin (B-12) 5000 MCG CAPS Take 5,000 mcg by mouth daily.     empagliflozin (JARDIANCE) 25 MG TABS tablet Take 1 tablet (25 mg total) by mouth daily before breakfast. 90 tablet 3   Insulin Disposable Pump (V-GO 30) KIT Use 1 a day 90 kit 3   insulin lispro (HUMALOG) 100 UNIT/ML injection INJECT UP TO 80 UNITS DAILY IN VGO. 90 mL 3   metFORMIN (GLUCOPHAGE-XR) 500 MG 24 hr tablet Take 4 tablets (2,000 mg total)  by mouth daily. 360 tablet 3   Semaglutide, 1 MG/DOSE, 4 MG/3ML SOPN Inject 1 mg as directed once a  week. 9 mL 3   No facility-administered medications prior to visit.     EXAM:  BP 120/62 (BP Location: Left Arm, Patient Position: Sitting, Cuff Size: Large)   Pulse 85   Temp 98.1 F (36.7 C) (Oral)   Ht '5\' 11"'  (1.803 m)   Wt 284 lb 3.2 oz (128.9 kg)   SpO2 95%   BMI 39.64 kg/m   Body mass index is 39.64 kg/m. Wt Readings from Last 3 Encounters:  01/30/21 284 lb 3.2 oz (128.9 kg)  01/16/21 275 lb (124.7 kg)  12/01/20 276 lb 12.8 oz (125.6 kg)    Physical Exam: Vital signs reviewed IWP:YKDX is a well-developed well-nourished alert cooperative    who appearsr stated age in no acute distress.  HEENT: normocephalic atraumatic , Eyes: PERRL EOM's full, conjunctiva clear, Nares: paten,t no deformity discharge or tenderness., Ears: no deformity EAC's clear TMs with normal landmarks. Mouth: masked  NECK: dec rom without masses, thyromegaly or bruits. CHEST/PULM:  Clear to auscultation and percussion breath sounds equal no wheeze , rales or rhonchi.  CV: PMI is nondisplaced, S1 S2 no gallops, murmurs, rubs. Peripheral pulses are fpresent without delay.No JVD .  ABDOMEN: Bowel sounds normal nontender  No guard or rebound, no hepato splenomegal no CVA tenderness.  Anorectal area with +1 soft hemorrhoidal swelling abou posterior no mass felt. Extremtities:  No clubbing cyanosis oatrophy right arm distal   rle much improved  swelling skin clear  brace is off  NEURO:  Oriented x3, cranial nerves 3-12 appear to be intact, gait within normal limits no abnormal reflexes or asymmetrical SKIN: No acute rashes normal turgor, color, no bruising or petechiae. No foot ulcers  PSYCH: Oriented, good eye contact, no obvious depression anxiety, cognition and judgment appear normal. LN: no cervical axillary inguinal adenopathy  Lab Results  Component Value Date   WBC 9.6 10/31/2020   HGB 16.3 10/31/2020   HCT 47.1 10/31/2020   PLT 248 10/31/2020   GLUCOSE 151 (H) 10/31/2020   CHOL 160 09/06/2020    TRIG 220 (H) 09/06/2020   HDL 36 (L) 09/06/2020   LDLDIRECT 125.0 12/26/2018   LDLCALC 87 09/06/2020   ALT 35 09/06/2020   AST 23 09/06/2020   NA 138 10/31/2020   K 4.1 10/31/2020   CL 99 10/31/2020   CREATININE 0.94 10/31/2020   BUN 19 10/31/2020   CO2 27 10/31/2020   TSH 1.32 04/27/2019   HGBA1C 6.7 (A) 11/18/2020   MICROALBUR 0.5 02/08/2020    BP Readings from Last 3 Encounters:  01/30/21 120/62  01/16/21 102/68  12/01/20 98/68    Lab results  in system reviewed    ASSESSMENT AND PLAN:  Discussed the following assessment and plan:    ICD-10-CM   1. Visit for preventive health examination  Z00.00     2. Type 2 diabetes mellitus with complication, with long-term current use of insulin ([name])  E11.8    Z79.4     3. Hyperlipidemia associated with type 2 diabetes mellitus ([place])  E11.69    E78.5     4. Mixed hyperlipidemia  E78.2     5. Chronic deep vein thrombosis (DVT) of right lower extremity, unspecified vein ([name])  I82.501     6. Cervical disc disorder  M50.90     7. Chronic anticoagulation  Z79.01     8. Personal history  of COVID-19  Z86.16     9. Family history of abdominal aortic aneurysm (AAA)  Z82.49     10. Hemorrhoids, unspecified hemorrhoid type  K64.9     Leg looks much better under control.  Rectal bump seems like a hemorrhoid of not concerning Discussed family history of AAA he had a abdominal scan 2020 showed no vascular distortion. Consider readdressing it when he gets older age 43 or earlier as indicated Can consider PSA lab work at some point negative family history. Let us know if there are other concerns in the next year he has multiple specialists Still wonder if the orthostatic dizziness or problem could be related to post-COVID.  Return in about 1 year (around 01/30/2022).  Patient Care Team: Cambrie Sonnenfeld, [name], MD as PCP - General (Internal Medicine) Philemon Kingdom, MD as Consulting Physician (Internal Medicine) Patient  Instructions  Good to see you today .  Attention   lifestyle interventions.  As you are doing.  Keep fu with  low Bp and episodes  ? If could be  covid related ?  Keep your follow ups.  Would  wait at least 6 months before covid booster  unless new compelling data   comes along in interim.  2020 had no aneurysm or vascular  abnormality  on ct  Bump is a  hemorrhoidal swelling  .  Glad to see you today .  Leg looks  good  . Keep up  good work .  Preventive Care 24-73 Years Old, Male Preventive care refers to lifestyle choices and visits with your health care provider that can promote health and wellness. Preventive care visits are also called wellness exams. What can I expect for my preventive care visit? Counseling During your preventive care visit, your health care provider may ask about your: Medical history, including: Past medical problems. Family medical history. Current health, including: Emotional well-being. Home life and relationship well-being. Sexual activity. Lifestyle, including: Alcohol, nicotine or tobacco, and drug use. Access to firearms. Diet, exercise, and sleep habits. Safety issues such as seatbelt and bike helmet use. Sunscreen use. Work and work Statistician. Physical exam Your health care provider will check your: Height and weight. These may be used to calculate your BMI (body mass index). BMI is a measurement that tells if you are at a healthy weight. Waist circumference. This measures the distance around your waistline. This measurement also tells if you are at a healthy weight and may help predict your risk of certain diseases, such as type 2 diabetes and high blood pressure. Heart rate and blood pressure. Body temperature. Skin for abnormal spots. What immunizations do I need? Vaccines are usually given at various ages, according to a schedule. Your health care provider will recommend vaccines for you based on your age, medical history, and lifestyle or  other factors, such as travel or where you work. What tests do I need? Screening Your health care provider may recommend screening tests for certain conditions. This may include: Lipid and cholesterol levels. Diabetes screening. This is done by checking your blood sugar (glucose) after you have not eaten for a while (fasting). Hepatitis B test. Hepatitis C test. HIV (human immunodeficiency virus) test. STI (sexually transmitted infection) testing, if you are at risk. Lung cancer screening. Prostate cancer screening. Colorectal cancer screening. Talk with your health care provider about your test results, treatment options, and if necessary, the need for more tests. Follow these instructions at home: Eating and drinking  Eat a diet that  includes fresh fruits and vegetables, whole grains, lean protein, and low-fat dairy products. Take vitamin and mineral supplements as recommended by your health care provider. Do not drink alcohol if your health care provider tells you not to drink. If you drink alcohol: Limit how much you have to 0-2 drinks a day. Know how much alcohol is in your drink. In the [place] one drink equals one 12 oz bottle of beer (355 mL), one 5 oz glass of wine (148 mL), or one 1 oz glass of hard liquor (44 mL). Lifestyle Brush your teeth every morning and night with fluoride toothpaste. Floss one time each day. Exercise for at least 30 minutes 5 or more days each week. Do not use any products that contain nicotine or tobacco. These products include cigarettes, chewing tobacco, and vaping devices, such as e-cigarettes. If you need help quitting, ask your health care provider. Do not use drugs. If you are sexually active, practice safe sex. Use a condom or other form of protection to prevent STIs. Take aspirin only as told by your health care provider. Make sure that you understand how much to take and what form to take. Work with your health care provider to find out whether  it is safe and beneficial for you to take aspirin daily. Find healthy ways to manage stress, such as: Meditation, yoga, or listening to music. Journaling. Talking to a trusted person. Spending time with friends and family. Minimize exposure to UV radiation to reduce your risk of skin cancer. Safety Always wear your seat belt while driving or riding in a vehicle. Do not drive: If you have been drinking alcohol. Do not ride with someone who has been drinking. When you are tired or distracted. While texting. If you have been using any mind-altering substances or drugs. Wear a helmet and other protective equipment during sports activities. If you have firearms in your house, make sure you follow all gun safety procedures. What's next? Go to your health care provider once a year for an annual wellness visit. Ask your health care provider how often you should have your eyes and teeth checked. Stay up to date on all vaccines. This information is not intended to replace advice given to you by your health care provider. Make sure you discuss any questions you have with your health care provider. Document Revised: 08/24/2020 Document Reviewed: 08/24/2020 Elsevier Patient Education  2022 [place] Liron Eissler M.D.

## 2021-01-30 ENCOUNTER — Ambulatory Visit (INDEPENDENT_AMBULATORY_CARE_PROVIDER_SITE_OTHER): Payer: BC Managed Care – PPO | Admitting: Internal Medicine

## 2021-01-30 ENCOUNTER — Encounter: Payer: Self-pay | Admitting: Internal Medicine

## 2021-01-30 VITALS — BP 120/62 | HR 85 | Temp 98.1°F | Ht 71.0 in | Wt 284.2 lb

## 2021-01-30 DIAGNOSIS — E118 Type 2 diabetes mellitus with unspecified complications: Secondary | ICD-10-CM | POA: Diagnosis not present

## 2021-01-30 DIAGNOSIS — E1169 Type 2 diabetes mellitus with other specified complication: Secondary | ICD-10-CM | POA: Diagnosis not present

## 2021-01-30 DIAGNOSIS — Z7901 Long term (current) use of anticoagulants: Secondary | ICD-10-CM

## 2021-01-30 DIAGNOSIS — Z8616 Personal history of COVID-19: Secondary | ICD-10-CM

## 2021-01-30 DIAGNOSIS — E782 Mixed hyperlipidemia: Secondary | ICD-10-CM

## 2021-01-30 DIAGNOSIS — M5090 Cervical disc disorder, unspecified, unspecified cervical region: Secondary | ICD-10-CM

## 2021-01-30 DIAGNOSIS — E785 Hyperlipidemia, unspecified: Secondary | ICD-10-CM

## 2021-01-30 DIAGNOSIS — Z0000 Encounter for general adult medical examination without abnormal findings: Secondary | ICD-10-CM

## 2021-01-30 DIAGNOSIS — Z8249 Family history of ischemic heart disease and other diseases of the circulatory system: Secondary | ICD-10-CM

## 2021-01-30 DIAGNOSIS — K649 Unspecified hemorrhoids: Secondary | ICD-10-CM

## 2021-01-30 DIAGNOSIS — Z794 Long term (current) use of insulin: Secondary | ICD-10-CM

## 2021-01-30 DIAGNOSIS — I82501 Chronic embolism and thrombosis of unspecified deep veins of right lower extremity: Secondary | ICD-10-CM | POA: Diagnosis not present

## 2021-01-30 NOTE — Patient Instructions (Addendum)
Good to see you today .  Attention   lifestyle interventions.  As you are doing.  Keep fu with  low Bp and episodes  ? If could be  covid related ?  Keep your follow ups.  Would  wait at least 6 months before covid booster  unless new compelling data   comes along in interim.  2020 had no aneurysm or vascular  abnormality  on ct  Bump is a  hemorrhoidal swelling  .  Glad to see you today .  Leg looks  good  . Keep up  good work .  Preventive Care 12-59 Years Old, Male Preventive care refers to lifestyle choices and visits with your health care provider that can promote health and wellness. Preventive care visits are also called wellness exams. What can I expect for my preventive care visit? Counseling During your preventive care visit, your health care provider may ask about your: Medical history, including: Past medical problems. Family medical history. Current health, including: Emotional well-being. Home life and relationship well-being. Sexual activity. Lifestyle, including: Alcohol, nicotine or tobacco, and drug use. Access to firearms. Diet, exercise, and sleep habits. Safety issues such as seatbelt and bike helmet use. Sunscreen use. Work and work Statistician. Physical exam Your health care provider will check your: Height and weight. These may be used to calculate your BMI (body mass index). BMI is a measurement that tells if you are at a healthy weight. Waist circumference. This measures the distance around your waistline. This measurement also tells if you are at a healthy weight and may help predict your risk of certain diseases, such as type 2 diabetes and high blood pressure. Heart rate and blood pressure. Body temperature. Skin for abnormal spots. What immunizations do I need? Vaccines are usually given at various ages, according to a schedule. Your health care provider will recommend vaccines for you based on your age, medical history, and lifestyle or other factors,  such as travel or where you work. What tests do I need? Screening Your health care provider may recommend screening tests for certain conditions. This may include: Lipid and cholesterol levels. Diabetes screening. This is done by checking your blood sugar (glucose) after you have not eaten for a while (fasting). Hepatitis B test. Hepatitis C test. HIV (human immunodeficiency virus) test. STI (sexually transmitted infection) testing, if you are at risk. Lung cancer screening. Prostate cancer screening. Colorectal cancer screening. Talk with your health care provider about your test results, treatment options, and if necessary, the need for more tests. Follow these instructions at home: Eating and drinking  Eat a diet that includes fresh fruits and vegetables, whole grains, lean protein, and low-fat dairy products. Take vitamin and mineral supplements as recommended by your health care provider. Do not drink alcohol if your health care provider tells you not to drink. If you drink alcohol: Limit how much you have to 0-2 drinks a day. Know how much alcohol is in your drink. In the [place] one drink equals one 12 oz bottle of beer (355 mL), one 5 oz glass of wine (148 mL), or one 1 oz glass of hard liquor (44 mL). Lifestyle Brush your teeth every morning and night with fluoride toothpaste. Floss one time each day. Exercise for at least 30 minutes 5 or more days each week. Do not use any products that contain nicotine or tobacco. These products include cigarettes, chewing tobacco, and vaping devices, such as e-cigarettes. If you need help quitting, ask your health  care provider. Do not use drugs. If you are sexually active, practice safe sex. Use a condom or other form of protection to prevent STIs. Take aspirin only as told by your health care provider. Make sure that you understand how much to take and what form to take. Work with your health care provider to find out whether it is safe and  beneficial for you to take aspirin daily. Find healthy ways to manage stress, such as: Meditation, yoga, or listening to music. Journaling. Talking to a trusted person. Spending time with friends and family. Minimize exposure to UV radiation to reduce your risk of skin cancer. Safety Always wear your seat belt while driving or riding in a vehicle. Do not drive: If you have been drinking alcohol. Do not ride with someone who has been drinking. When you are tired or distracted. While texting. If you have been using any mind-altering substances or drugs. Wear a helmet and other protective equipment during sports activities. If you have firearms in your house, make sure you follow all gun safety procedures. What's next? Go to your health care provider once a year for an annual wellness visit. Ask your health care provider how often you should have your eyes and teeth checked. Stay up to date on all vaccines. This information is not intended to replace advice given to you by your health care provider. Make sure you discuss any questions you have with your health care provider. Document Revised: 08/24/2020 Document Reviewed: 08/24/2020 Elsevier Patient Education  [place].

## 2021-02-01 ENCOUNTER — Encounter: Payer: Self-pay | Admitting: Neurology

## 2021-02-01 ENCOUNTER — Ambulatory Visit (INDEPENDENT_AMBULATORY_CARE_PROVIDER_SITE_OTHER): Payer: BC Managed Care – PPO | Admitting: Neurology

## 2021-02-01 ENCOUNTER — Other Ambulatory Visit: Payer: Self-pay

## 2021-02-01 VITALS — BP 114/75 | HR 88 | Ht 71.0 in | Wt 281.0 lb

## 2021-02-01 DIAGNOSIS — R42 Dizziness and giddiness: Secondary | ICD-10-CM | POA: Diagnosis not present

## 2021-02-01 DIAGNOSIS — R55 Syncope and collapse: Secondary | ICD-10-CM

## 2021-02-01 NOTE — Patient Instructions (Addendum)
MRI brain wo contrast  Routine EEG   Check TSH

## 2021-02-01 NOTE — Progress Notes (Signed)
[facility] Clinic Note - Initial Visit   Date: 02/01/21  Troy Troy [name] MRN: [id number] DOB: 1970/07/16   Dear Dr. Regis Bill:  Thank you for your kind referral of Troy Troy [name]. Although his history is well known to you, please allow [name] to reiterate it for the purpose of our medical record. The patient was accompanied to the clinic by self.  History of Present Illness: Troy Troy [name] is a 50 y.o. right-handed male with well-controlled diabetes, OSA, s/p cervical surgery presenting for evaluation of Troy [name] of leg weakness.  In August 2022, he developed COVID. A week later, he was doing yard work and came into the kitchen to drink some water, then recalls hearing noise of things falling to the floor and somehow went to sofa. His wife found the kitchen in a little disarray with her husband on the sofa. The spell lasted about 30-40 minutes.  He went to the ER where his basic labs and chest x-ray was normal. . Since time, he reports sensation of vertigo, as if the room is tilted to the left.  He saw ENT for vertigo whose evaluation was negative.  He has ongoing Troy [name] of legs feeling weak upon standing, where tends to fall back into his chair. Witnesses say he turns white and collapses into chair. He denies any prodromal symptoms such as light headedness, tunnel vision, or palpitations. He saw cardiology who ordered echo and  event monitor which did not show any diagnostic findings. He is referred here for further evaluation.  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 He has residual weakness (18% strength) and numbness of the hand.   He works in Therapist, art for [name].  Lives with his wife and son.   Out-side paper records, electronic medical record, and images have been reviewed where available and summarized as:  Lab Results  Component Value Date   HGBA1C 6.7  (A) 11/18/2020   Lab Results  Component Value Date   VITAMINB12 >2000 (H) 09/06/2020   Lab Results  Component Value Date   TSH 1.32 04/27/2019   Lab Results  Component Value Date   ESRSEDRATE 11 02/08/2020    Past Medical History:  Diagnosis Date   Arm weakness    right    Asthma    B12 deficiency    Back pain    Complication of anesthesia    "as a teenager , as they were waken up, I was shaking all over- they called it convulsions -it has never happen again"   Diabetes insipidus ([place])    Diabetes mellitus without complication ([place])    TYPE II   DVT (deep venous thrombosis) ([place])    right leg last time 2020   Leg edema, right    Male infertility    Neck injury 12/22/2013   Neuropathy    Obesity    Obesity    OSA (obstructive sleep apnea)    CPAP    Past Surgical History:  Procedure Laterality Date   ANTERIOR CERVICAL DECOMP/DISCECTOMY FUSION N/A 05/25/2020   Procedure: ANTERIOR CERVICAL DECOMPRESSION/DISCECTOMY FUSION, INTERBODY PROSTHESIS, PLATE/SCREWS CERVICAL FIVE- CERVICAL SIX;  Surgeon: Newman Pies, MD;  Location: [employer];  Service: Neurosurgery;  Laterality: N/A;   BACK SURGERY     CERVICAL DISC SURGERY     C6/C7 2009   cubital tunnel left arm     2003   ELBOW  SURGERY     FIBULA FRACTURE SURGERY     plate & pin removed due to infection [name]       Medications:  Outpatient Encounter Medications as of 02/01/2021  Medication Sig   albuterol (PROVENTIL HFA;VENTOLIN HFA) 108 (90 Base) MCG/ACT inhaler Inhale 2 puffs into the lungs every 6 (six) hours as needed. For wheezing   apixaban (ELIQUIS) 5 MG TABS tablet TAKE 1 TABLET(5 MG) BY MOUTH TWICE DAILY   atorvastatin (LIPITOR) 40 MG tablet TAKE 1 TABLET DAILY   cholecalciferol (VITAMIN D3) 25 MCG (1000 UNIT) tablet Take 1,000 Units by mouth daily.   Continuous Blood Gluc Sensor (FREESTYLE LIBRE 14 DAY SENSOR) MISC CHANGE EVERY 14 DAYS AS DIRECTED, CHANGE EVERY 2 WEEKS    Cyanocobalamin (B-12) 5000 MCG CAPS Take 5,000 mcg by mouth daily. Takes every other day   empagliflozin (JARDIANCE) 25 MG TABS tablet Take 1 tablet (25 mg total) by mouth daily before breakfast.   Insulin Disposable Pump (V-GO 30) KIT Use 1 a day   insulin lispro (HUMALOG) 100 UNIT/ML injection INJECT UP TO 80 UNITS DAILY IN VGO.   metFORMIN (GLUCOPHAGE-XR) 500 MG 24 hr tablet Take 4 tablets (2,000 mg total) by mouth daily.   Semaglutide, 1 MG/DOSE, 4 MG/3ML SOPN Inject 1 mg as directed once a week.   No facility-administered encounter medications on file as of 02/01/2021.    Allergies: No Known Allergies  Family History: Family History  Problem Relation Age of Onset   Heart disease Mother    Other Mother        clotting disorders   Diabetes Mother    High blood pressure Mother    Kidney disease Mother    Depression Mother    Sleep apnea Mother    Obesity Mother    Other Father        sudden death/cervical and lumbar disk disease   Aneurysm Father        In his 92s smoked   Hyperlipidemia Father    High blood pressure Father    Sudden death Father    Alcoholism Father    Hypertension Other    Allergies Other    Deep vein thrombosis Other    Sleep apnea Other    Obesity Other     Social History: Social History   Tobacco Use   Smoking status: Never   Smokeless tobacco: Never  Vaping Use   Vaping Use: Never used  Substance Use Topics   Alcohol use: Yes    Comment: 1 - 2 a year   Drug use: No   Social History   Social History Narrative   Occupation:  [name]   Worked 11- 8 am  Now changed job and shift to [employer] through Thursday    Working  Ft 40 hours mostly   Married '10 10 10   ' Wife s/p bariatric surgery pt   Regular exercise- yes   Pt does have children   Father died suddenly 05/21/08   Mom passes 2017  Acute rep disease after acute renal failure   Daily caffeine use one a day   2 dogs and cat.       Right Handed              Vital  Signs:  BP 114/75   Pulse 88   Ht '5\' 11"'  (1.803 m)   Wt 281 lb (127.5 kg)   SpO2 97%   BMI  39.19 kg/m   Neurological Exam: MENTAL STATUS including orientation to time, place, person, recent and remote memory, attention span and concentration, language, and fund of knowledge is normal.  Speech is not dysarthric.  CRANIAL NERVES: II:  No visual field defects.   III-IV-VI: Pupils equal round and reactive to light.  Normal conjugate, extra-ocular eye movements in all directions of gaze.  No nystagmus.  No ptosis.   V:  Normal facial sensation.    VII:  Normal facial symmetry and movements.   VIII:  Normal hearing and vestibular function.   IX-X:  Normal palatal movement.   XI:  Normal shoulder shrug and head rotation.   XII:  Normal tongue strength and range of motion, no deviation or fasciculation.  MOTOR:  Right hand and extensor arm atrophy (old), fasciculations or abnormal movements.  No pronator drift.   Upper Extremity:  Right  Left  Deltoid  5/5   5/5   Biceps  5/5   5/5   Triceps  5/5   5/5   Infraspinatus 5/5  5/5  Medial pectoralis 5/5  5/5  Wrist extensors  5-/5   5/5   Wrist flexors  5-/5   5/5   Finger extensors  4/5   5/5   Finger flexors  4/5   5/5   Dorsal interossei  3/5   5/5   Abductor pollicis  4/5   5/5   Tone (Ashworth scale)  0  0   Lower Extremity:  Right  Left  Hip flexors  5/5   5/5   Hip extensors  5/5   5/5   Adductor 5/5  5/5  Abductor 5/5  5/5  Knee flexors  5/5   5/5   Knee extensors  5/5   5/5   Dorsiflexors  5/5   5/5   Plantarflexors  5/5   5/5   Toe extensors  5/5   5/5   Toe flexors  5/5   5/5   Tone (Ashworth scale)  0  0   MSRs:  Right        Left                  brachioradialis 2+  2+  biceps 2+  2+  triceps 2+  2+  patellar 2+  2+  ankle jerk 0  0  Hoffman no  no  plantar response down  down   SENSORY:  Vibration, temperature, and pin prick reduced over the feet bilaterally.   Romberg's sign absent.    COORDINATION/GAIT: Normal finger-to- nose-finger.  Intact rapid alternating movements bilaterally.  Gait narrow based and stable. Tandem and stressed gait intact.    IMPRESSION: Orthostatic dizziness vs dysautonomia following COVID infection. His history is not suggestive of seizures and favors more of a near syncopal type of event. I agree with Dr.Smith that POTS is a consideration. To be complete, he will have MRI brain and routine EEG. If this return normal, additional autonomic testing such as tilt study may be indicated. Unfortunately, we do not have that available locally he may ne need to be referred out for this.   Thank you for allowing me to participate in patient's care.  If I can answer any additional questions, I would be pleased to do so.    Sincerely,    Raymond Azure K. Posey Pronto, DO

## 2021-02-06 ENCOUNTER — Other Ambulatory Visit (INDEPENDENT_AMBULATORY_CARE_PROVIDER_SITE_OTHER): Payer: BC Managed Care – PPO

## 2021-02-06 ENCOUNTER — Other Ambulatory Visit: Payer: Self-pay

## 2021-02-06 ENCOUNTER — Ambulatory Visit (INDEPENDENT_AMBULATORY_CARE_PROVIDER_SITE_OTHER): Payer: BC Managed Care – PPO | Admitting: Neurology

## 2021-02-06 DIAGNOSIS — R42 Dizziness and giddiness: Secondary | ICD-10-CM

## 2021-02-06 DIAGNOSIS — R55 Syncope and collapse: Secondary | ICD-10-CM

## 2021-02-06 LAB — TSH: TSH: 1.06 u[IU]/mL (ref 0.35–5.50)

## 2021-02-08 ENCOUNTER — Other Ambulatory Visit: Payer: Self-pay | Admitting: Internal Medicine

## 2021-02-08 DIAGNOSIS — E11319 Type 2 diabetes mellitus with unspecified diabetic retinopathy without macular edema: Secondary | ICD-10-CM

## 2021-02-15 ENCOUNTER — Encounter: Payer: Self-pay | Admitting: Internal Medicine

## 2021-02-20 ENCOUNTER — Ambulatory Visit (INDEPENDENT_AMBULATORY_CARE_PROVIDER_SITE_OTHER): Payer: BC Managed Care – PPO | Admitting: Adult Health

## 2021-02-20 ENCOUNTER — Other Ambulatory Visit: Payer: Self-pay | Admitting: Internal Medicine

## 2021-02-20 ENCOUNTER — Other Ambulatory Visit: Payer: Self-pay

## 2021-02-20 ENCOUNTER — Encounter (INDEPENDENT_AMBULATORY_CARE_PROVIDER_SITE_OTHER): Payer: Self-pay | Admitting: Adult Health

## 2021-02-20 VITALS — BP 112/68 | HR 76 | Temp 97.7°F | Ht 71.0 in | Wt 279.0 lb

## 2021-02-20 DIAGNOSIS — E1169 Type 2 diabetes mellitus with other specified complication: Secondary | ICD-10-CM

## 2021-02-20 DIAGNOSIS — Z794 Long term (current) use of insulin: Secondary | ICD-10-CM

## 2021-02-20 DIAGNOSIS — R55 Syncope and collapse: Secondary | ICD-10-CM

## 2021-02-20 DIAGNOSIS — E118 Type 2 diabetes mellitus with unspecified complications: Secondary | ICD-10-CM | POA: Diagnosis not present

## 2021-02-20 DIAGNOSIS — Z6839 Body mass index (BMI) 39.0-39.9, adult: Secondary | ICD-10-CM

## 2021-02-20 DIAGNOSIS — E785 Hyperlipidemia, unspecified: Secondary | ICD-10-CM

## 2021-02-20 MED ORDER — SEMAGLUTIDE (1 MG/DOSE) 4 MG/3ML ~~LOC~~ SOPN: 1.0000 mg | PEN_INJECTOR | SUBCUTANEOUS | 3 refills | Status: DC

## 2021-02-20 NOTE — Progress Notes (Signed)
Chief Complaint:   OBESITY Troy [name] is here to discuss his progress with his obesity treatment plan along with follow-up of his obesity related diagnoses. [name] is on the Category 3 Plan and states he is following his eating plan approximately 80% of the time. [name] states he is not exercising regularly at this time.  Today's visit was #: 24 Starting weight: 293 lbs Starting date: 09/07/2019 Today's weight: 279 lbs Today's date: 02/20/2021 Total lbs lost to date: 14 lbs Total lbs lost since last in-office visit: 0  Interim History:  He continues to experience syncopal events. Cleared by Cardiology: 2D ECHO- EF 55-60% EKG 11/02/20- NSR HR 96 BPM Non-Tele Monitoring-Interpretation: Sinus rhythm and sinus tachycardia No atrial fibrillation or significant bradycardia. 4 beat salvo of VT Ventricular ectopy burden < 1%  He is being followed by Neurology EEG- ordered MRI ordered for 12/16  Subjective:   1. Type 2 diabetes mellitus with complication, with long-term current use of insulin ([name]) Ambulatory fasting 120-130s. He is on metformin XR 500 mg - 4 tablets with breakfast, Jardiane 25 mg daily with breakfast, Humolog- dispensed by VGO system Ozempic 1 mg - backorder - only missed 1 dose. He is managed by Endo/Dr. Renne Crigler  2. Syncope, unspecified syncope type He continues to experience syncopal events. Cleared by Cardiology: 2D ECHO- EF 55-60% EKG 11/02/20- NSR HR 96 BPM Non-Tele Monitoring-Interpretation: Sinus rhythm and sinus tachycardia No atrial fibrillation or significant bradycardia. 4 beat salvo of VT Ventricular ectopy burden < 1%  He is being followed by Neurology EEG- ordered MRI ordered for 12/16  3. Hyperlipidemia associated with type 2 diabetes mellitus ([place]) LDL above goal for diabetic 87 - goal <70. He is on atorvastatin 40 mg daily - managed by PCP.  Assessment/Plan:   1. Type 2 diabetes mellitus with complication, with long-term current  use of insulin ([place]) Follow up with Endocrinology as directed.  2. Syncope, unspecified syncope type MRI on 12/16.   Remain well hydrated. Activity as tolerated.  3. Hyperlipidemia associated with type 2 diabetes mellitus ([place]) Follow up with PCP for hyperlipidemia management.  4. Obesity, current BMI 40.1  [name] is currently in the action stage of change. As such, his goal is to continue with weight loss efforts. He has agreed to the Category 3 Plan.   Remain well hydrated.  Continue with Neurology.  Exercise goals:  Activity as tolerated.  Allow 30 seconds in between position changes.  Behavioral modification strategies: increasing lean protein intake, decreasing simple carbohydrates, meal planning and cooking strategies, keeping healthy foods in the home, and planning for success.  Yosiel has agreed to follow-up with our clinic in 4-6 weeks. He was informed of the importance of frequent follow-up visits to maximize his success with intensive lifestyle modifications for his multiple health conditions.   Objective:   Blood pressure 112/68, pulse 76, temperature 97.7 F (36.5 C), height 5\' 11"  (1.803 m), weight 279 lb (126.6 kg), SpO2 98 %. Body mass index is 38.91 kg/m.  General: Cooperative, alert, well developed, in no acute distress. HEENT: Conjunctivae and lids unremarkable. Cardiovascular: Regular rhythm.  Lungs: Normal work of breathing. Neurologic: No focal deficits.   Lab Results  Component Value Date   CREATININE 0.94 10/31/2020   BUN 19 10/31/2020   NA 138 10/31/2020   K 4.1 10/31/2020   CL 99 10/31/2020   CO2 27 10/31/2020   Lab Results  Component Value Date   ALT 35 09/06/2020   AST 23 09/06/2020  ALKPHOS 75 09/06/2020   BILITOT 0.5 09/06/2020   Lab Results  Component Value Date   HGBA1C 6.7 (A) 11/18/2020   HGBA1C 8.2 (H) 09/06/2020   HGBA1C 6.7 (H) 05/25/2020   HGBA1C 5.9 (A) 11/30/2019   HGBA1C 6.4 (A) 07/27/2019   Lab Results   Component Value Date   INSULIN 7.9 09/06/2020   Lab Results  Component Value Date   TSH 1.06 02/06/2021   Lab Results  Component Value Date   CHOL 160 09/06/2020   HDL 36 (L) 09/06/2020   LDLCALC 87 09/06/2020   LDLDIRECT 125.0 12/26/2018   TRIG 220 (H) 09/06/2020   CHOLHDL 4.4 09/06/2020   Lab Results  Component Value Date   VD25OH 55.3 09/06/2020   VD25OH 63.0 06/13/2020   VD25OH 56.8 12/21/2019   Lab Results  Component Value Date   WBC 9.6 10/31/2020   HGB 16.3 10/31/2020   HCT 47.1 10/31/2020   MCV 94.4 10/31/2020   PLT 248 10/31/2020   Lab Results  Component Value Date   IRON 144 02/08/2020   TIBC 358 02/08/2020   FERRITIN 96 02/08/2020   Attestation Statements:   Reviewed by clinician on day of visit: allergies, medications, problem list, medical history, surgical history, family history, social history, and previous encounter notes.  Time spent on visit including pre-visit chart review and post-visit care and charting was 29 minutes.   I, Water quality scientist, CMA, am acting as Location manager for Mina Marble, NP.  I have reviewed the above documentation for accuracy and completeness, and I agree with the above. -  Jentzen Minasyan d. Kasten Leveque, NP-C

## 2021-02-21 NOTE — Procedures (Signed)
ELECTROENCEPHALOGRAM REPORT  Date of Study: 02/06/2021  Patient's Name: Troy [name] MRN: [id number] Date of Birth: January 25, 1971  Referring Provider: Dr. Narda Amber  Clinical History: This is a 50 year old man with episodes of leg weakness. EEG for classification.  Medications: PROVENTIL HFA;VENTOLIN HFA 108 (90i Base) MCG/ACT inhaler ELIQUIS 5 MG TABS tablet LIPITOR 40 MG tablet VITAMIN D3 25 MCG (1000 UNIT) tablet B-12 5000 MCG CAPS JARDIANCE 25 MG TABS tablet HUMALOG 100 UNIT/ML injection GLUCOPHAGE-XR 500 MG 24 hr tablet Semaglutide, 1 MG/DOSE, 4 MG/3ML SOPN  Technical Summary: A multichannel digital EEG recording measured by the international 10-20 system with electrodes applied with paste and impedances below 5000 ohms performed in our laboratory with EKG monitoring in an awake and asleep patient.  Hyperventilation was not performed. Photic stimulation was performed.  The digital EEG was referentially recorded, reformatted, and digitally filtered in a variety of bipolar and referential montages for optimal display.    Description: The patient is awake and asleep during the recording.  During maximal wakefulness, there is no clear posterior dominant rhythm seen. The record is symmetric.  During drowsiness and sleep, there is an increase in theta slowing of the background, with shifting asymmetry over the bilateral temporal regions.  Vertex waves and symmetric sleep spindles were seen.  Photic stimulation did not elicit any abnormalities.  There were no epileptiform discharges or electrographic seizures seen.    EKG lead was unremarkable.  Impression: This awake and asleep EEG is within normal limits.  Clinical Correlation: A normal EEG does not exclude a clinical diagnosis of epilepsy.  If further clinical questions remain, prolonged EEG may be helpful.  Clinical correlation is advised.   Ellouise Newer, M.D.

## 2021-02-24 ENCOUNTER — Ambulatory Visit
Admission: RE | Admit: 2021-02-24 | Discharge: 2021-02-24 | Disposition: A | Discharge status: Home / Self Care | Payer: BC Managed Care – PPO | Source: Ambulatory Visit | Attending: Neurology | Admitting: Neurology

## 2021-02-24 ENCOUNTER — Other Ambulatory Visit: Payer: Self-pay

## 2021-02-24 DIAGNOSIS — R55 Syncope and collapse: Secondary | ICD-10-CM | POA: Diagnosis not present

## 2021-02-24 DIAGNOSIS — R42 Dizziness and giddiness: Secondary | ICD-10-CM | POA: Diagnosis not present

## 2021-03-07 ENCOUNTER — Other Ambulatory Visit: Payer: Self-pay

## 2021-03-07 DIAGNOSIS — R55 Syncope and collapse: Secondary | ICD-10-CM

## 2021-03-07 DIAGNOSIS — R42 Dizziness and giddiness: Secondary | ICD-10-CM

## 2021-03-07 NOTE — Progress Notes (Signed)
Referral created.

## 2021-03-23 ENCOUNTER — Ambulatory Visit (INDEPENDENT_AMBULATORY_CARE_PROVIDER_SITE_OTHER): Payer: BC Managed Care – PPO | Admitting: Adult Health

## 2021-03-23 ENCOUNTER — Encounter (INDEPENDENT_AMBULATORY_CARE_PROVIDER_SITE_OTHER): Payer: Self-pay | Admitting: Adult Health

## 2021-03-23 ENCOUNTER — Other Ambulatory Visit: Payer: Self-pay

## 2021-03-23 VITALS — BP 113/72 | HR 87 | Temp 97.9°F | Ht 71.0 in | Wt 279.0 lb

## 2021-03-23 DIAGNOSIS — E559 Vitamin D deficiency, unspecified: Secondary | ICD-10-CM | POA: Diagnosis not present

## 2021-03-23 DIAGNOSIS — R55 Syncope and collapse: Secondary | ICD-10-CM | POA: Insufficient documentation

## 2021-03-23 DIAGNOSIS — Z6838 Body mass index (BMI) 38.0-38.9, adult: Secondary | ICD-10-CM

## 2021-03-23 DIAGNOSIS — Z794 Long term (current) use of insulin: Secondary | ICD-10-CM | POA: Diagnosis not present

## 2021-03-23 DIAGNOSIS — E118 Type 2 diabetes mellitus with unspecified complications: Secondary | ICD-10-CM

## 2021-03-23 DIAGNOSIS — Z9189 Other specified personal risk factors, not elsewhere classified: Secondary | ICD-10-CM

## 2021-03-23 DIAGNOSIS — E669 Obesity, unspecified: Secondary | ICD-10-CM | POA: Diagnosis not present

## 2021-03-23 NOTE — Progress Notes (Signed)
Chief Complaint:   OBESITY Troy [name] is here to discuss his progress with his obesity treatment plan along with follow-up of his obesity related diagnoses. Troy [name] is on the Category 3 Plan and states he is following his eating plan approximately 80% of the time. Troy [name] states he is not exercising regularly at this time.  Today's visit was #: 25 Starting weight: 293 lbs Starting date: 09/07/2019 Today's weight: 279 lbs Today's date: 03/23/2021 Total lbs lost to date: 14 lbs Total lbs lost since last in-office visit: 0  Interim History:  Troy [name] was experiencing bilateral leg weakness 2 weeks ago - fell to the floor at work.  He denies LOC or striking his head.  03/03/21-Neuro - EEG/MRI both normal, referral placed to Dr. Margrett Rud at [facility] in [place] for further work-up.  Subjective:   1. Type 2 diabetes mellitus with complication, with long-term current use of insulin ([name]) He is on  Fasting BG 99-130. On 11/18/2020 - A1c - 6.7 - at goal.  Next appt with  Endo/Dr. Cruzita Lederer - 03/30/2021.  2. Syncope, unspecified syncope type Ambulatory readings- SBP 100-110, DBP 60-70. 03/03/21-Neuro - EEG/MRI both normal, referral placed to Dr. Margrett Rud at [facility] in [place] for further work-up. He estimates to drink 5L water/day.  3. Vitamin D deficiency He is on OTC vitamin D3 1000 IU daily.  4. At risk for activity intolerance Troy [name] is at risk for activity intolerance due to bilateral lower extremity weakness.  Assessment/Plan:   1. Type 2 diabetes mellitus with complication, with long-term current use of insulin ([name]) Check labs.  Continue with Endo/Dr. Cruzita Lederer as directed.  - Comprehensive metabolic panel - Hemoglobin A1c  2. Syncope, unspecified syncope type Follow-up with Neurology, follow-up with specialist. Remain well hydrated, continue to monitor BP.  3. Vitamin D deficiency Check vitamin D level today.  - VITAMIN D 25 Hydroxy (Vit-D Deficiency, Fractures)  4. At risk for  activity intolerance [name] was given approximately 15 minutes of deficit intake of food prevention counseling today. Troy [name] is at risk for eating too few calories based on current food recall. He was encouraged to focus on meeting caloric and protein goals according to his recommended meal plan.   5. Obesity, current BMI 38.9  [place] is currently in the action stage of change. As such, his goal is to continue with weight loss efforts. He has agreed to the Category 3 Plan.   Exercise goals: No exercise has been prescribed at this time.  Behavioral modification strategies: increasing lean protein intake, decreasing simple carbohydrates, meal planning and cooking strategies, keeping healthy foods in the home, and planning for success.  Troy [name] has agreed to follow-up with our clinic in 4 weeks. He was informed of the importance of frequent follow-up visits to maximize his success with intensive lifestyle modifications for his multiple health conditions.   Troy [name] was informed we would discuss his lab results at his next visit unless there is a critical issue that needs to be addressed sooner. Troy [name] agreed to keep his next visit at the agreed upon time to discuss these results.  Objective:   Blood pressure 113/72, pulse 87, temperature 97.9 F (36.6 C), height 5\' 11"  (1.803 m), weight 279 lb (126.6 kg), SpO2 99 %. Body mass index is 38.91 kg/m.  General: Cooperative, alert, well developed, in no acute distress. HEENT: Conjunctivae and lids unremarkable. Cardiovascular: Regular rhythm.  Lungs: Normal work of breathing. Neurologic: No focal deficits.   Lab Results  Component Value Date  CREATININE 0.94 10/31/2020   BUN 19 10/31/2020   NA 138 10/31/2020   K 4.1 10/31/2020   CL 99 10/31/2020   CO2 27 10/31/2020   Lab Results  Component Value Date   ALT 35 09/06/2020   AST 23 09/06/2020   ALKPHOS 75 09/06/2020   BILITOT 0.5 09/06/2020   Lab Results  Component Value Date    HGBA1C 6.7 (A) 11/18/2020   HGBA1C 8.2 (H) 09/06/2020   HGBA1C 6.7 (H) 05/25/2020   HGBA1C 5.9 (A) 11/30/2019   HGBA1C 6.4 (A) 07/27/2019   Lab Results  Component Value Date   INSULIN 7.9 09/06/2020   Lab Results  Component Value Date   TSH 1.06 02/06/2021   Lab Results  Component Value Date   CHOL 160 09/06/2020   HDL 36 (L) 09/06/2020   LDLCALC 87 09/06/2020   LDLDIRECT 125.0 12/26/2018   TRIG 220 (H) 09/06/2020   CHOLHDL 4.4 09/06/2020   Lab Results  Component Value Date   VD25OH 55.3 09/06/2020   VD25OH 63.0 06/13/2020   VD25OH 56.8 12/21/2019   Lab Results  Component Value Date   WBC 9.6 10/31/2020   HGB 16.3 10/31/2020   HCT 47.1 10/31/2020   MCV 94.4 10/31/2020   PLT 248 10/31/2020   Lab Results  Component Value Date   IRON 144 02/08/2020   TIBC 358 02/08/2020   FERRITIN 96 02/08/2020   Attestation Statements:   Reviewed by clinician on day of visit: allergies, medications, problem list, medical history, surgical history, family history, social history, and previous encounter notes.  I, Water quality scientist, CMA, am acting as Location manager for Mina Marble, NP.  I have reviewed the above documentation for accuracy and completeness, and I agree with the above. -  Thena Devora d. Jiyan Walkowski, NP-C

## 2021-03-24 LAB — COMPREHENSIVE METABOLIC PANEL
ALT: 23 IU/L (ref 0–44)
AST: 15 IU/L (ref 0–40)
Albumin/Globulin Ratio: 2.1 (ref 1.2–2.2)
Albumin: 4.6 g/dL (ref 4.0–5.0)
Alkaline Phosphatase: 70 IU/L (ref 44–121)
BUN/Creatinine Ratio: 22 — ABNORMAL HIGH (ref 9–20)
BUN: 17 mg/dL (ref 6–24)
Bilirubin Total: 0.7 mg/dL (ref 0.0–1.2)
CO2: 24 mmol/L (ref 20–29)
Calcium: 9.4 mg/dL (ref 8.7–10.2)
Chloride: 105 mmol/L (ref 96–106)
Creatinine, Ser: 0.76 mg/dL (ref 0.76–1.27)
Globulin, Total: 2.2 g/dL (ref 1.5–4.5)
Glucose: 115 mg/dL — ABNORMAL HIGH (ref 70–99)
Potassium: 4.4 mmol/L (ref 3.5–5.2)
Sodium: 141 mmol/L (ref 134–144)
Total Protein: 6.8 g/dL (ref 6.0–8.5)
eGFR: 109 mL/min/{1.73_m2} (ref >59)

## 2021-03-24 LAB — VITAMIN D 25 HYDROXY (VIT D DEFICIENCY, FRACTURES): Vit D, 25-Hydroxy: 70.2 ng/mL (ref 30.0–100.0)

## 2021-03-24 LAB — HEMOGLOBIN A1C
Est. average glucose Bld gHb Est-mCnc: 180 mg/dL
Hgb A1c MFr Bld: 7.9 % — ABNORMAL HIGH (ref 4.8–5.6)

## 2021-03-30 ENCOUNTER — Encounter: Payer: Self-pay | Admitting: Internal Medicine

## 2021-03-30 ENCOUNTER — Other Ambulatory Visit: Payer: Self-pay

## 2021-03-30 ENCOUNTER — Ambulatory Visit (INDEPENDENT_AMBULATORY_CARE_PROVIDER_SITE_OTHER): Payer: BC Managed Care – PPO | Admitting: Internal Medicine

## 2021-03-30 VITALS — BP 128/70 | HR 93 | Ht 71.0 in | Wt 287.0 lb

## 2021-03-30 DIAGNOSIS — Z794 Long term (current) use of insulin: Secondary | ICD-10-CM | POA: Diagnosis not present

## 2021-03-30 DIAGNOSIS — R55 Syncope and collapse: Secondary | ICD-10-CM

## 2021-03-30 DIAGNOSIS — E785 Hyperlipidemia, unspecified: Secondary | ICD-10-CM

## 2021-03-30 DIAGNOSIS — E11319 Type 2 diabetes mellitus with unspecified diabetic retinopathy without macular edema: Secondary | ICD-10-CM

## 2021-03-30 DIAGNOSIS — Z6841 Body Mass Index (BMI) 40.0 and over, adult: Secondary | ICD-10-CM

## 2021-03-30 MED ORDER — DEXCOM G6 TRANSMITTER MISC: 1.0000 | 3 refills | Status: DC

## 2021-03-30 MED ORDER — DEXCOM G6 SENSOR MISC: 1.0000 | 3 refills | Status: AC

## 2021-03-30 MED ORDER — DEXCOM G6 RECEIVER DEVI: 1.0000 | Freq: Once | 0 refills | Status: AC

## 2021-03-30 MED ORDER — INSULIN ASPART 100 UNIT/ML IJ SOLN: INTRAMUSCULAR | 3 refills | Status: DC

## 2021-03-30 NOTE — Progress Notes (Signed)
Today number Subjective:     Patient ID: Troy [name], male   DOB: 1970-12-30, 51 y.o.   MRN: [id number]  This visit occurred during the SARS-CoV-2 public health emergency.  Safety protocols were in place, including screening questions prior to the visit, additional usage of staff PPE, and extensive cleaning of exam room while observing appropriate contact time as indicated for disinfecting solutions.   HPI Troy [name] is a pleasant 51 y.o. man, returning for management of DM2, dx 2012, insulin-dependent, with complications (DR), uncontrolled.  Last visit 4 months ago.  Interim history: At the beginning of last year he had epidural steroid injections (02/2020 and 03/2020) and then cervical decompression surgery (diskectomy, fusion) on 05/25/2020.  Sugars awere higher at that time. He continues to work with the [employer] management clinic. Before last visit, he experienced vertigo in 09/2020, before a diagnosis of COVID-19.  He then had a syncopal episode (CBG 160).  He had milder episodes afterwards - but not recently.  No orthostasis.  We checked him for adrenal insufficiency and investigation was negative after last visit.  More recently he had leg weakness and had several falls.  He had extensive investigation >> negative. He was referred to cardiology in [place] - was given an appt in 1 year!  However, he feels that the leg weakness has improved and he has not fallen in 2 weeks. No increased urination, blurry vision, nausea, chest pain. He had L knee pain  - will have PT -this is offered through his work.  Reviewed HbA1c levels: Lab Results  Component Value Date   HGBA1C 7.9 (H) 03/23/2021   HGBA1C 6.7 (A) 11/18/2020   HGBA1C 8.2 (H) 09/06/2020   He is on: - Metformin XR 2000 mg in a.m. - Invokana 100 mg >> Jardiance 25 mg before breakfast - VGo 40 >> 30: 2-3 clicks per meal >> 6 clicks per meal >> YME15 >> now VGo30 (ran out of AXE94) with 4-6 clicks per meal - Ozempic 0.5 mg  weekly-started 06/2017 >> 1 mg weekly  - 3 weeks off the med, restarted 3 weeks ago He was on Victoza before >> no SEs.   He checks his sugars more than 4 times a day with his CGM:   Previously:  And -No CKD: Lab Results  Component Value Date   BUN 17 03/23/2021   BUN 19 10/31/2020   CREATININE 0.76 03/23/2021   CREATININE 0.94 10/31/2020   No MAU: Lab Results  Component Value Date   MICRALBCREAT 6 02/08/2020   MICRALBCREAT 0.5 12/26/2018   MICRALBCREAT 1.4 07/22/2017   MICRALBCREAT 2.2 12/18/2016   MICRALBCREAT 5.8 10/18/2015   MICRALBCREAT 2.0 03/16/2013   MICRALBCREAT 1.6 11/06/2012   MICRALBCREAT 2.5 07/31/2012   MICRALBCREAT 3.6 11/26/2011   MICRALBCREAT 2.3 11/28/2010  He is not on ACE inhibitor/ARB.  -+ HL: Lab Results  Component Value Date   CHOL 160 09/06/2020   HDL 36 (L) 09/06/2020   LDLCALC 87 09/06/2020   LDLDIRECT 125.0 12/26/2018   TRIG 220 (H) 09/06/2020   CHOLHDL 4.4 09/06/2020  On Lipitor 20.  - Last eye exam was in 04/2019: + DR.  He sees a retina specialist.  -No numbness and tingling in feet - sees podiatry.  Previously on gabapentin, then on Lyrica, then off.  He has OSA and is compliant with his CPAP. He had a DVT in R leg in 04/2015.  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  He noticed that sugars were higher after he started Eliquis. He lost 45 lbs before 2017 - mostly vegan food.  He has a history of ADD. He did see urology before and was given Viagra, but causes for ED were not explored. We checked his testosterone level.  This returned mildly low: Component     Latest Ref Rng & Units 04/27/2019  Testosterone, Serum (Total)     ng/dL 262 (L)  % Free Testosterone     % 1.9  Free Testosterone, S     pg/mL 50 (L)  Sex Hormone Binding Globulin     nmol/L 34.5  After this, I referred him to the weight management clinic.  Reviewed B12 levels: Lab Results  Component Value Date   VITAMINB12  >2000 (H) 09/06/2020   VITAMINB12 237 04/27/2019   VITAMINB12 236 08/19/2017  Previously on B12 1000 mcg daily, then on 5000 mcg daily, but off at last visit.  After last visit, after an episode of syncope, we checked him for adrenal insufficiency: Component     Latest Ref Rng & Units 11/24/2020 11/24/2020 11/24/2020         2:08 PM  2:39 PM  3:05 PM  Cortisol, Plasma     ug/dL 9.1 21.8 28.4  Normal cosyntropin stimulation test-no evidence of adrenal insufficiency.  Review of Systems: + See HPI  Cardiovascular: no CP/no SOB/no palpitations/+ chronic right leg swelling Musculoskeletal: no muscle aches/+ joint aches Neurological: + hand tremors/no numbness/no tingling/+ vertigo and dizziness  I reviewed pt's medications, allergies, PMH, social hx, family hx, and changes were documented in the history of present illness. Otherwise, unchanged from my initial visit note.  Past Medical History:  Diagnosis Date   Arm weakness    right    Asthma    B12 deficiency    Back pain    Complication of anesthesia    "as a teenager , as they were waken up, I was shaking all over- they called it convulsions -it has never happen again"   Diabetes insipidus ([place])    Diabetes mellitus without complication ([place])    TYPE II   DVT (deep venous thrombosis) ([name])    right leg last time 2020   Leg edema, right    Male infertility    Neck injury 12/22/2013   Neuropathy    Obesity    Obesity    OSA (obstructive sleep apnea)    CPAP   Past Surgical History:  Procedure Laterality Date   ANTERIOR CERVICAL DECOMP/DISCECTOMY FUSION N/A 05/25/2020   Procedure: ANTERIOR CERVICAL DECOMPRESSION/DISCECTOMY FUSION, INTERBODY PROSTHESIS, PLATE/SCREWS CERVICAL FIVE- CERVICAL SIX;  Surgeon: Newman Pies, MD;  Location: [facility];  Service: Neurosurgery;  Laterality: N/A;   BACK SURGERY     CERVICAL DISC SURGERY     C6/C7 2009   cubital tunnel left arm     2003   ELBOW SURGERY     FIBULA FRACTURE SURGERY      plate & pin removed due to infection [place]     Social History   Socioeconomic History   Marital status: Married    Spouse name: Juliann Pulse   Number of children: 1   Years of education: Not on file   Highest education level: Not on file  Occupational History   Occupation: Customer Service Lead  Tobacco Use   Smoking status: Never   Smokeless tobacco: Never  Vaping Use   Vaping Use: Never used  Substance and Sexual Activity  Alcohol use: Yes    Comment: 1 - 2 a year   Drug use: No   Sexual activity: Yes  Other Topics Concern   Not on file  Social History Narrative   Occupation:  days [employer] 11- 8 am  Now changed job and shift to [employer] through Thursday    Working  Ft 40 hours mostly   Married '10 10 10   ' Wife s/p bariatric surgery pt   Regular exercise- yes   Pt does have children   Father died suddenly 05-13-2008   Mom passes 2017  Acute rep disease after acute renal failure   Daily caffeine use one a day   2 dogs and cat.       Right Handed             Social Determinants of Health   Financial Resource Strain: Not on file  Food Insecurity: Not on file  Transportation Needs: Not on file  Physical Activity: Not on file  Stress: Not on file  Social Connections: Not on file  Intimate Partner Violence: Not on file   Current Outpatient Medications on File Prior to Visit  Medication Sig Dispense Refill   albuterol (PROVENTIL HFA;VENTOLIN HFA) 108 (90 Base) MCG/ACT inhaler Inhale 2 puffs into the lungs every 6 (six) hours as needed. For wheezing 1 Inhaler 2   apixaban (ELIQUIS) 5 MG TABS tablet TAKE 1 TABLET(5 MG) BY MOUTH TWICE DAILY 180 tablet 1   atorvastatin (LIPITOR) 40 MG tablet TAKE 1 TABLET DAILY 90 tablet 1   cholecalciferol (VITAMIN D3) 25 MCG (1000 UNIT) tablet Take 1,000 Units by mouth daily.     Continuous Blood Gluc Sensor (FREESTYLE LIBRE 14 DAY SENSOR) MISC CHANGE EVERY 14 DAYS AS DIRECTED, CHANGE EVERY 2 WEEKS 4 each 1    Cyanocobalamin (B-12) 5000 MCG CAPS Take 5,000 mcg by mouth daily. Takes every other day     empagliflozin (JARDIANCE) 25 MG TABS tablet Take 1 tablet (25 mg total) by mouth daily before breakfast. 90 tablet 3   Insulin Disposable Pump (V-GO 30) KIT Use 1 a day 90 kit 3   insulin lispro (HUMALOG) 100 UNIT/ML injection INJECT UP TO 80 UNITS DAILY IN VGO. 90 mL 3   metFORMIN (GLUCOPHAGE-XR) 500 MG 24 hr tablet TAKE 4 TABLETS DAILY WITH BREAKFAST 360 tablet 3   Semaglutide, 1 MG/DOSE, 4 MG/3ML SOPN Inject 1 mg as directed once a week. 9 mL 3   No current facility-administered medications on file prior to visit.   No Known Allergies Family History  Problem Relation Age of Onset   Heart disease Mother    Other Mother        clotting disorders   Diabetes Mother    High blood pressure Mother    Kidney disease Mother    Depression Mother    Sleep apnea Mother    Obesity Mother    Other Father        sudden death/cervical and lumbar disk disease   Aneurysm Father        In his 45s smoked   Hyperlipidemia Father    High blood pressure Father    Sudden death Father    Alcoholism Father    Hypertension Other    Allergies Other    Deep vein thrombosis Other    Sleep apnea Other    Obesity Other     Objective:   Physical Exam BP 128/70 (BP Location: Left Arm, Patient  Position: Sitting, Cuff Size: Normal)    Pulse 93    Ht '5\' 11"'  (1.803 m)    Wt 287 lb (130.2 kg)    SpO2 96%    BMI 40.03 kg/m   Wt Readings from Last 3 Encounters:  03/30/21 287 lb (130.2 kg)  03/23/21 279 lb (126.6 kg)  02/20/21 279 lb (126.6 kg)   Constitutional: overweight, in NAD Eyes: PERRLA, EOMI, no exophthalmos ENT: moist mucous membranes, no thyromegaly, no cervical lymphadenopathy Cardiovascular: tachycardia, RR, No MRG, + RLE swelling -chronic, after his DVT  Respiratory: CTA B Musculoskeletal: no deformities, strength intact in all 4 Skin: moist, warm, no rashes Neurological: + Mild tremors with  outstretched hands, DTR normal in all 4  Assessment:     1. DM2, insulin-dependent, uncontrolled, with complications - DR  2. Obesity class 3 BMI Classification: < 18.5 underweight  18.5-24.9 normal weight  25.0-29.9 overweight  30.0-34.9 class I obesity  35.0-39.9 class II obesity  ? 40.0 class III obesity   3. HL  4.  History of syncopal episode  Plan:     1. Patient with history of uncontrolled diabetes, on oral antidiabetic regimen with metformin, SGLT2 inhibitor, and also weekly GLP-1 receptor agonist along with a VGo patch pump.  His diabetes improved after he started to lose weight and working with the weight management clinic.  HbA1c decreased to 6.7% at last visit.  At that time, sugars were slightly more fluctuating compared to the previous visit, with higher blood sugars after breakfast and also more variable blood sugars after lunch.  However, he was off Ozempic at that time and I advised him to restart.  We did discuss about using the CeQur device, but we did not have to switch at last visit. -Since last visit, he had a higher HbA1c, of 7.9% of time and earlier this month CGM interpretation: -At today's visit, we reviewed his CGM downloads: It appears that 59% of values are in target range (goal >70%), while 41% are higher than 180 (goal <25%), and 0% are lower than 70 (goal <4%).  The calculated average blood sugar is 169.   -Reviewing the CGM trends, sugars appear to be more variable and overall higher than before, especially overnight, since sugars after dinner are higher.  Upon questioning, he has been out of Ozempic for 3 weeks and was able to refill it only 3 weeks ago.  We discussed about increasing his insulin with lunch and dinner by 2 units unless he has smaller meals, in which case, he can continue with 6 units for each.  He usually has a low-carb breakfast and he only boluses 4 units for this.  We have data lost between 7:30 AM and 1 PM, however, he does not appear  to have much higher blood sugars after breakfast consistently. -At next visit, we may need to increase the Ozempic dose to 2 mg weekly, but this dose has been on backorder recently - I advised him to: Patient Instructions  Please continue: - Metformin XR 2000 mg at dinnertime - Jardiance 25 mg before b'fast  - VPX10 4-6 clicks per meal - but use 8 clicks for larger meals - Ozempic 1 mg weekly  Please return in 4 months.  - advised to check sugars at different times of the day - 4x a day, rotating check times - advised for yearly eye exams >> he is not UTD - return to clinic in 4 months  2. Obesity class 3 -continue SGLT 2  inhibitor and GLP-1 receptor agonist which should also help with weight loss -He continues to be followed in the weight management clinic -He gained 12 pounds before last visit and 9 lbs since then  3. HL -Reviewed latest lipid panel from 08/2020: LDL closer to goal of less than 70, triglycerides high, HDL low: Lab Results  Component Value Date   CHOL 160 09/06/2020   HDL 36 (L) 09/06/2020   LDLCALC 87 09/06/2020   LDLDIRECT 125.0 12/26/2018   TRIG 220 (H) 09/06/2020   CHOLHDL 4.4 09/06/2020  -He continues on Lipitor 40 mg daily without side effects  4.  History of syncope -Denies unintentional weight loss, abdominal pain, headaches -He has been on dexamethasone earlier in the year, and also had epidural steroid injections - I explained that these can cause iatrogenic adrenal insufficiency -We ruled out adrenal insufficiency by a normal cosyntropin stimulation test  Philemon Kingdom, MD PhD [facility] Endocrinology

## 2021-03-30 NOTE — Patient Instructions (Addendum)
Please continue: - Metformin XR 2000 mg at dinnertime - Jardiance 25 mg before b'fast  - SHF02 4-6 clicks per meal - but use 8 clicks for larger meals - Ozempic 1 mg weekly  Please return in 4 months.

## 2021-04-05 DIAGNOSIS — M542 Cervicalgia: Secondary | ICD-10-CM | POA: Diagnosis not present

## 2021-04-05 DIAGNOSIS — G90A Postural orthostatic tachycardia syndrome (POTS): Secondary | ICD-10-CM | POA: Insufficient documentation

## 2021-04-05 DIAGNOSIS — I825Z1 Chronic embolism and thrombosis of unspecified deep veins of right distal lower extremity: Secondary | ICD-10-CM

## 2021-04-05 DIAGNOSIS — M62838 Other muscle spasm: Secondary | ICD-10-CM | POA: Diagnosis not present

## 2021-04-05 DIAGNOSIS — H60311 Diffuse otitis externa, right ear: Secondary | ICD-10-CM | POA: Diagnosis not present

## 2021-04-05 DIAGNOSIS — R42 Dizziness and giddiness: Secondary | ICD-10-CM | POA: Insufficient documentation

## 2021-04-05 DIAGNOSIS — H66001 Acute suppurative otitis media without spontaneous rupture of ear drum, right ear: Secondary | ICD-10-CM | POA: Diagnosis not present

## 2021-04-05 HISTORY — DX: Postural orthostatic tachycardia syndrome (POTS): G90.A

## 2021-04-05 HISTORY — DX: Dizziness and giddiness: R42

## 2021-04-05 HISTORY — DX: Chronic embolism and thrombosis of unspecified deep veins of right distal lower extremity: I82.5Z1

## 2021-04-17 DIAGNOSIS — G4733 Obstructive sleep apnea (adult) (pediatric): Secondary | ICD-10-CM | POA: Diagnosis not present

## 2021-04-20 ENCOUNTER — Ambulatory Visit (INDEPENDENT_AMBULATORY_CARE_PROVIDER_SITE_OTHER): Payer: BC Managed Care – PPO | Admitting: Adult Health

## 2021-05-08 ENCOUNTER — Other Ambulatory Visit: Payer: Self-pay

## 2021-05-08 MED ORDER — ATORVASTATIN CALCIUM 40 MG PO TABS: ORAL_TABLET | ORAL | 2 refills | Status: DC

## 2021-05-22 ENCOUNTER — Encounter (INDEPENDENT_AMBULATORY_CARE_PROVIDER_SITE_OTHER): Payer: Self-pay

## 2021-05-22 ENCOUNTER — Ambulatory Visit (INDEPENDENT_AMBULATORY_CARE_PROVIDER_SITE_OTHER): Payer: BC Managed Care – PPO | Admitting: Adult Health

## 2021-06-04 NOTE — Progress Notes (Signed)
? ?Chief Complaint  ?Patient presents with  ? Ear Pain  ?  Touching causing pain seems to come and go, tingling, started in Feb  ? ? ?HPI: ?Troy [name] 50 y.o. come in for intermittent right external ear pain sensitivity off and on since January where he was treated for external otitis and otitis media without rupture. ? ?Tender to touch off and down  and  sensitive  in middle .  No swelling or fever or cold symptoms     went to [facility]   urg[facility] care Jan 25    OE   and OM  Gave cipro Dex drops andaugmentin  and another 2 weeks   after some mild rebound. ? ?Does not have a cold nor discharge and has not had this problem before. ?ROS: See pertinent positives and negatives per HPI. ?Acupuncture has helped his neck pain and stiffness still needs accommodation.  ? ?Blood sugar still in range. ?His POTS type symptoms are slowly improving post COVID had an appointment with the specialist in [place] but not until December. ?HasDexcom meter. ?Past Medical History:  ?Diagnosis Date  ? Arm weakness   ? right   ? Asthma   ? B12 deficiency   ? Back pain   ? Complication of anesthesia   ? "as a teenager , as they were waken up, I was shaking all over- they called it convulsions -it has never happen again"  ? Diabetes insipidus ([name])   ? Diabetes mellitus without complication ([name])   ? TYPE II  ? DVT (deep venous thrombosis) ([name])   ? right leg last time 2020  ? Leg edema, right   ? Male infertility   ? Neck injury 12/22/2013  ? Neuropathy   ? Obesity   ? Obesity   ? OSA (obstructive sleep apnea)   ? CPAP  ? ? ?Family History  ?Problem Relation Age of Onset  ? Heart disease Mother   ? Other Mother   ?     clotting disorders  ? Diabetes Mother   ? High blood pressure Mother   ? Kidney disease Mother   ? Depression Mother   ? Sleep apnea Mother   ? Obesity Mother   ? Other Father   ?     sudden death/cervical and lumbar disk disease  ? Aneurysm Father   ?     In his 50s smoked  ? Hyperlipidemia Father   ? High blood  pressure Father   ? Sudden death Father   ? Alcoholism Father   ? Hypertension Other   ? Allergies Other   ? Deep vein thrombosis Other   ? Sleep apnea Other   ? Obesity Other   ? ? ?Social History  ? ?Socioeconomic History  ? Marital status: Married  ?  Spouse name: Kathy  ? Number of children: 1  ? Years of education: Not on file  ? Highest education level: Bachelor's degree (e.g., BA, AB, BS)  ?Occupational History  ? Occupation: Customer Service Lead  ?Tobacco Use  ? Smoking status: Never  ? Smokeless tobacco: Never  ?Vaping Use  ? Vaping Use: Never used  ?Substance and Sexual Activity  ? Alcohol use: Yes  ?  Comment: 1 - 2 a year  ? Drug use: No  ? Sexual activity: Yes  ?Other Topics Concern  ? Not on file  ?Social History Narrative  ? Occupation:  days [employer]   Worked 11- 8 am  Now changed   job and shift to [employer] through Thursday   ? Working  Ft 40 hours mostly  ? Married _0 ? Wife s/p bariatric surgery pt  ? Regular exercise- yes  ? Pt does have children  ? Father died suddenly 2008/05/24  ? Mom passes 2017  Acute rep disease after acute renal failure  ? Daily caffeine use one a day  ? 2 dogs and cat.   ?   ? Right Handed   ?   ?   ?   ? ?Social Determinants of Health  ? ?Financial Resource Strain: Medium Risk  ? Difficulty of Paying Living Expenses: Somewhat hard  ?Food Insecurity: Food Insecurity Present  ? Worried About Charity fundraiser in the Last Year: Sometimes true  ? Ran Out of Food in the Last Year: Never true  ?Transportation Needs: No Transportation Needs  ? Lack of Transportation (Medical): No  ? Lack of Transportation (Non-Medical): No  ?Physical Activity: Insufficiently Active  ? Days of Exercise per Week: 3 days  ? Minutes of Exercise per Session: 40 min  ?Stress: No Stress Concern Present  ? Feeling of Stress : Only a little  ?Social Connections: Unknown  ? Frequency of Communication with Friends and Family: Twice a week  ? Frequency of Social Gatherings with Friends and Family:  Not on file  ? Attends Religious Services: Never  ? Active Member of Clubs or Organizations: No  ? Attends Archivist Meetings: Not on file  ? Marital Status: Married  ? ? ?Outpatient Medications Prior to Visit  ?Medication Sig Dispense Refill  ? albuterol (PROVENTIL HFA;VENTOLIN HFA) 108 (90 Base) MCG/ACT inhaler Inhale 2 puffs into the lungs every 6 (six) hours as needed. For wheezing 1 Inhaler 2  ? apixaban (ELIQUIS) 5 MG TABS tablet TAKE 1 TABLET(5 MG) BY MOUTH TWICE DAILY 180 tablet 1  ? atorvastatin (LIPITOR) 40 MG tablet TAKE 1 TABLET DAILY 90 tablet 2  ? cholecalciferol (VITAMIN D3) 25 MCG (1000 UNIT) tablet Take 1,000 Units by mouth daily.    ? Continuous Blood Gluc Transmit (DEXCOM G6 TRANSMITTER) MISC 1 Device by Does not apply route every 3 (three) months. 1 each 3  ? Cyanocobalamin (B-12) 5000 MCG CAPS Take 5,000 mcg by mouth daily. Takes every other day    ? empagliflozin (JARDIANCE) 25 MG TABS tablet Take 1 tablet (25 mg total) by mouth daily before breakfast. 90 tablet 3  ? insulin aspart (NOVOLOG) 100 UNIT/ML injection Use 80 units a day in the insulin pump 90 mL 3  ? Insulin Disposable Pump (V-GO 30) KIT Use 1 a day 90 kit 3  ? metFORMIN (GLUCOPHAGE-XR) 500 MG 24 hr tablet TAKE 4 TABLETS DAILY WITH BREAKFAST 360 tablet 3  ? Semaglutide, 1 MG/DOSE, 4 MG/3ML SOPN Inject 1 mg as directed once a week. 9 mL 3  ? ?No facility-administered medications prior to visit.  ? ? ? ?EXAM: ? ?BP 122/74 (BP Location: Left Arm, Patient Position: Sitting, Cuff Size: Normal)   Pulse 90   Temp 98.2 ?F (36.8 ?C) (Oral)   Ht 5' 11" (1.803 m)   Wt 288 lb 3.2 oz (130.7 kg)   SpO2 98%   BMI 40.20 kg/m?  ? ?Body mass index is 40.2 kg/m?. ? ?GENERAL: vitals reviewed and listed above, alert, oriented, appears well hydrated and in no acute distress ?HEENT: atraumatic, conjunctiva  clear, no obvious abnormalities on inspection of external nose and ears  ?Negative adenopathy or  rash around the ear subjective  discomfort at tragal pull EAC appears patent without redness ;it is a tortuous canal but pink without edema.  TM visualized is appears gray and no discharge is seen.   Left EAC and TM no acute findings ?NECK: no obvious masses on inspection palpation  ?CV: HRRR, no clubbing cyanosis or  peripheral edema nl cap refill  ?MS: moves all extremities without noticeable focal  abnormality ?PSYCH: pleasant and cooperative, no obvious depression or anxiety ?Lab Results  ?Component Value Date  ? WBC 9.6 10/31/2020  ? HGB 16.3 10/31/2020  ? HCT 47.1 10/31/2020  ? PLT 248 10/31/2020  ? GLUCOSE 115 (H) 03/23/2021  ? CHOL 160 09/06/2020  ? TRIG 220 (H) 09/06/2020  ? HDL 36 (L) 09/06/2020  ? LDLDIRECT 125.0 12/26/2018  ? LDLCALC 87 09/06/2020  ? ALT 23 03/23/2021  ? AST 15 03/23/2021  ? NA 141 03/23/2021  ? K 4.4 03/23/2021  ? CL 105 03/23/2021  ? CREATININE 0.76 03/23/2021  ? BUN 17 03/23/2021  ? CO2 24 03/23/2021  ? TSH 1.06 02/06/2021  ? HGBA1C 7.9 (H) 03/23/2021  ? MICROALBUR 0.5 02/08/2020  ? ?BP Readings from Last 3 Encounters:  ?06/05/21 122/74  ?03/30/21 128/70  ?03/23/21 113/72  ?Reviewed note on his phone from the previous urgent care. ? ?ASSESSMENT AND PLAN: ? ?Discussed the following assessment and plan: ? ?Ear pain, right - Hx eo in jan ; no obv debri today mild sx  epmiric rx floxin  may need ent to check ear ? ?History of otitis externa ? ?Type 2 diabetes mellitus with complication, with long-term current use of insulin ([name]) ?What sounds like a significant otitis externa from January with improvement in the sensation of external ear symptoms without confirmation on exam.  Some of his pain sounds neuralgic also.  Does notice it worse after wearing his headset but the headset does not of sound like it should be aggravating. ? ?We will retreat with Floxin no steroid and if after couple weeks not improving or recurring ?Would get ENT consult about the cause  and rx of his ear pain and persistent symptoms. ? ?Glad his  neck stiffness and pain is improved with acupuncture. ?-Patient advised to return or notify health care team  if  new concerns arise. ? ?Patient Instructions  ?Ear canal doesn't look swollen . But   if on going we would

## 2021-06-05 ENCOUNTER — Ambulatory Visit (INDEPENDENT_AMBULATORY_CARE_PROVIDER_SITE_OTHER): Payer: BC Managed Care – PPO | Admitting: Internal Medicine

## 2021-06-05 VITALS — BP 122/74 | HR 90 | Temp 98.2°F | Ht 71.0 in | Wt 288.2 lb

## 2021-06-05 DIAGNOSIS — Z794 Long term (current) use of insulin: Secondary | ICD-10-CM | POA: Diagnosis not present

## 2021-06-05 DIAGNOSIS — Z8669 Personal history of other diseases of the nervous system and sense organs: Secondary | ICD-10-CM | POA: Diagnosis not present

## 2021-06-05 DIAGNOSIS — H9201 Otalgia, right ear: Secondary | ICD-10-CM | POA: Diagnosis not present

## 2021-06-05 DIAGNOSIS — E118 Type 2 diabetes mellitus with unspecified complications: Secondary | ICD-10-CM | POA: Diagnosis not present

## 2021-06-05 MED ORDER — OFLOXACIN 0.3 % OT SOLN: 10.0000 [drp] | Freq: Every day | OTIC | 0 refills | Status: DC

## 2021-06-05 NOTE — Patient Instructions (Addendum)
Ear canal doesn't look swollen . But   if on going we would have ENT do a good exam. ? ?But add  antibiotic drops for another 7 - 10 days.   For  possible mild external otitis .  ? ?Let us know if  persistent or progressive and we can do a referral.  ? ? ?

## 2021-06-14 ENCOUNTER — Encounter: Payer: Self-pay | Admitting: Internal Medicine

## 2021-06-14 DIAGNOSIS — H9201 Otalgia, right ear: Secondary | ICD-10-CM

## 2021-06-14 DIAGNOSIS — Z8669 Personal history of other diseases of the nervous system and sense organs: Secondary | ICD-10-CM

## 2021-06-22 DIAGNOSIS — H9201 Otalgia, right ear: Secondary | ICD-10-CM | POA: Diagnosis not present

## 2021-06-27 NOTE — Telephone Encounter (Signed)
Still advise  consult  check  check on referralplease

## 2021-06-28 ENCOUNTER — Telehealth: Payer: Self-pay | Admitting: Internal Medicine

## 2021-06-28 NOTE — Telephone Encounter (Signed)
FMLA form to be filled out--placed in dr's folder.  Fax to phone number on form upon completion. ?

## 2021-06-29 ENCOUNTER — Telehealth: Payer: Self-pay | Admitting: Internal Medicine

## 2021-06-29 NOTE — Telephone Encounter (Signed)
Last Ov 06/05/21 ?Please advise  ?Forms placed in folder ?

## 2021-06-29 NOTE — Telephone Encounter (Signed)
Waiting to hear back from referral coordinator  ?

## 2021-06-29 NOTE — Telephone Encounter (Signed)
Left message on patient voicemail informing him that his referral was sent to [facility]. The referral is in the queue waiting for them to contact the patient to schedule his appointment. Patient could wait for the schedulers to call him or call and schedule his appointment himself. ?  ?[facility] ?  ?[address] [place], [place] [address] ?  ?[phone] ?

## 2021-06-30 NOTE — Telephone Encounter (Signed)
Referral coordinator will contact patient regarding referral ?

## 2021-07-03 DIAGNOSIS — M7989 Other specified soft tissue disorders: Secondary | ICD-10-CM | POA: Diagnosis not present

## 2021-07-03 DIAGNOSIS — S92511A Displaced fracture of proximal phalanx of right lesser toe(s), initial encounter for closed fracture: Secondary | ICD-10-CM | POA: Diagnosis not present

## 2021-07-03 DIAGNOSIS — M79671 Pain in right foot: Secondary | ICD-10-CM | POA: Diagnosis not present

## 2021-07-03 DIAGNOSIS — Z0279 Encounter for issue of other medical certificate: Secondary | ICD-10-CM

## 2021-07-06 ENCOUNTER — Encounter: Payer: Self-pay | Admitting: Internal Medicine

## 2021-07-10 NOTE — Telephone Encounter (Signed)
PPW faxed and copied. Tried to call pt to advise but no answer.  ?

## 2021-07-10 NOTE — Telephone Encounter (Signed)
Singed completed Form is on your desk to be faxed and copy saved  and to scan

## 2021-07-11 NOTE — Telephone Encounter (Signed)
Noted  

## 2021-07-13 DIAGNOSIS — H9201 Otalgia, right ear: Secondary | ICD-10-CM

## 2021-07-13 DIAGNOSIS — M2669 Other specified disorders of temporomandibular joint: Secondary | ICD-10-CM

## 2021-07-13 HISTORY — DX: Otalgia, right ear: H92.01

## 2021-07-13 HISTORY — DX: Other specified disorders of temporomandibular joint: M26.69

## 2021-07-17 DIAGNOSIS — G4733 Obstructive sleep apnea (adult) (pediatric): Secondary | ICD-10-CM | POA: Diagnosis not present

## 2021-07-18 DIAGNOSIS — S92511D Displaced fracture of proximal phalanx of right lesser toe(s), subsequent encounter for fracture with routine healing: Secondary | ICD-10-CM | POA: Diagnosis not present

## 2021-07-18 DIAGNOSIS — S92511A Displaced fracture of proximal phalanx of right lesser toe(s), initial encounter for closed fracture: Secondary | ICD-10-CM | POA: Diagnosis not present

## 2021-08-08 ENCOUNTER — Encounter: Payer: Self-pay | Admitting: Internal Medicine

## 2021-08-08 ENCOUNTER — Ambulatory Visit (INDEPENDENT_AMBULATORY_CARE_PROVIDER_SITE_OTHER): Payer: BC Managed Care – PPO | Admitting: Internal Medicine

## 2021-08-08 VITALS — BP 118/82 | HR 92 | Ht 71.0 in | Wt 283.3 lb

## 2021-08-08 DIAGNOSIS — S92511D Displaced fracture of proximal phalanx of right lesser toe(s), subsequent encounter for fracture with routine healing: Secondary | ICD-10-CM | POA: Diagnosis not present

## 2021-08-08 DIAGNOSIS — E11319 Type 2 diabetes mellitus with unspecified diabetic retinopathy without macular edema: Secondary | ICD-10-CM

## 2021-08-08 DIAGNOSIS — E785 Hyperlipidemia, unspecified: Secondary | ICD-10-CM | POA: Diagnosis not present

## 2021-08-08 DIAGNOSIS — Z794 Long term (current) use of insulin: Secondary | ICD-10-CM

## 2021-08-08 DIAGNOSIS — Z6841 Body Mass Index (BMI) 40.0 and over, adult: Secondary | ICD-10-CM

## 2021-08-08 LAB — POCT GLYCOSYLATED HEMOGLOBIN (HGB A1C): Hemoglobin A1C: 6.9 % — AB (ref 4.0–5.6)

## 2021-08-08 NOTE — Progress Notes (Signed)
Today number Subjective:     Patient ID: Troy [name], male   DOB: 1971/01/11, 51 y.o.   MRN: [id number]  HPI Mr. Gaida is a pleasant 51 y.o. man, returning for management of DM2, dx 2012, insulin-dependent, with complications (DR), uncontrolled.  Last visit 4 months ago.  Interim history: He continues to work with the weight management clinic. Also, sees acupuncturist >> neck pain resolved. He continues to have leg weakness. He was dx'ed with POTS since last OV - improving. He had PT for L knee pain.  The pain is now resolved. He had R 3rd toe fracture - now on a boot.  He sees podiatry. No increased urination, blurry vision, nausea, chest pain.  Reviewed HbA1c levels: Lab Results  Component Value Date   HGBA1C 7.9 (H) 03/23/2021   HGBA1C 6.7 (A) 11/18/2020   HGBA1C 8.2 (H) 09/06/2020   He is on: - Metformin XR 2000 mg in a.m. - Invokana 100 mg >> Jardiance 25 mg before breakfast - VGo 40 >> 30 >> 20 >>VGo30 (ran out of GSU11) with 4-6 clicks per meal, but ate before a larger meal - Ozempic 0.5 mg weekly-started 06/2017 >> 1 mg weekly  He was on Victoza before >> no SEs.   He checks his sugars more than 4 times a day with his CGM:   Previously:   Previously:  Lowest: 49. Highest: 302 -  bolusing, [employer]  -No CKD: Lab Results  Component Value Date   BUN 17 03/23/2021   BUN 19 10/31/2020   CREATININE 0.76 03/23/2021   CREATININE 0.94 10/31/2020   No MAU: Lab Results  Component Value Date   MICRALBCREAT 6 02/08/2020   MICRALBCREAT 0.5 12/26/2018   MICRALBCREAT 1.4 07/22/2017   MICRALBCREAT 2.2 12/18/2016   MICRALBCREAT 5.8 10/18/2015   MICRALBCREAT 2.0 03/16/2013   MICRALBCREAT 1.6 11/06/2012   MICRALBCREAT 2.5 07/31/2012   MICRALBCREAT 3.6 11/26/2011   MICRALBCREAT 2.3 11/28/2010  He is not on ACE inhibitor/ARB.  -+ HL: Lab Results  Component Value Date   CHOL 160 09/06/2020   HDL 36 (L) 09/06/2020   LDLCALC 87 09/06/2020   LDLDIRECT  125.0 12/26/2018   TRIG 220 (H) 09/06/2020   CHOLHDL 4.4 09/06/2020  On Lipitor 20.  - Last eye exam was in 04/2019: + DR.  He sees a retina specialist.  -No numbness and tingling in feet - sees podiatry.  Previously on gabapentin, then on Lyrica, then off.  He has OSA and is compliant with his CPAP. He had a DVT in R leg in 04/2015.  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  He noticed that sugars were higher after he started Eliquis. He lost 45 lbs before 2017 - mostly vegan food.  He has a history of ADD. He did see urology before and was given Viagra, but causes for ED were not explored. We checked his testosterone level.  This returned mildly low: Component     Latest Ref Rng & Units 04/27/2019  Testosterone, Serum (Total)     ng/dL 262 (L)  % Free Testosterone     % 1.9  Free Testosterone, S     pg/mL 50 (L)  Sex Hormone Binding Globulin     nmol/L 34.5  After this, I referred him to the weight management clinic.  Reviewed B12 levels: Lab Results  Component Value Date   VITAMINB12 >2000 (H) 09/06/2020   [id number] 237 04/27/2019  [id number] 236 08/19/2017  Previously on B12 1000 mcg daily, then on 5000 mcg daily, but off at last visit.  After last visit, after an episode of syncope, we checked him for adrenal insufficiency: Component     Latest Ref Rng & Units 11/24/2020 11/24/2020 11/24/2020         2:08 PM  2:39 PM  3:05 PM  Cortisol, Plasma     ug/dL 9.1 21.8 28.4  Normal cosyntropin stimulation test-no evidence of adrenal insufficiency.  He had epidural steroid injections (02/2020 and 03/2020) and then cervical decompression surgery (diskectomy, fusion) on 05/25/2020. He experienced vertigo in 09/2020, before a diagnosis of COVID-19.  He then had a syncopal episode (CBG 160).  He had milder episodes afterwards - but not recently.  No orthostasis.  We checked him for adrenal insufficiency and investigation was negative after last  visit.  He developed leg weakness and had several falls.  He had extensive investigation >> negative. He was referred to cardiology in [place] - was given an appt in 1 year!   Review of Systems: + See HPI  + hand tremors/no numbness/no tingling/+ vertigo and dizziness  I reviewed pt's medications, allergies, PMH, social hx, family hx, and changes were documented in the history of present illness. Otherwise, unchanged from my initial visit note.  Past Medical History:  Diagnosis Date   Arm weakness    right    Asthma    B12 deficiency    Back pain    Complication of anesthesia    "as a teenager , as they were waken up, I was shaking all over- they called it convulsions -it has never happen again"   Diabetes insipidus ([place])    Diabetes mellitus without complication ([place])    TYPE II   DVT (deep venous thrombosis) ([name])    right leg last time 2020   Leg edema, right    Male infertility    Neck injury 12/22/2013   Neuropathy    Obesity    Obesity    OSA (obstructive sleep apnea)    CPAP   Past Surgical History:  Procedure Laterality Date   ANTERIOR CERVICAL DECOMP/DISCECTOMY FUSION N/A 05/25/2020   Procedure: ANTERIOR CERVICAL DECOMPRESSION/DISCECTOMY FUSION, INTERBODY PROSTHESIS, PLATE/SCREWS CERVICAL FIVE- CERVICAL SIX;  Surgeon: Newman Pies, MD;  Location: [facility];  Service: Neurosurgery;  Laterality: N/A;   BACK SURGERY     CERVICAL DISC SURGERY     C6/C7 2009   cubital tunnel left arm     2003   ELBOW SURGERY     FIBULA FRACTURE SURGERY     plate & pin removed due to infection 1997   ULNAR NERVE REPAIR     Social History   Socioeconomic History   Marital status: Married    Spouse name: Troy [name]   Number of children: 1   Years of education: Not on file   Highest education level: Bachelor's degree (e.g., BA, AB, BS)  Occupational History   Occupation: Customer Service Lead  Tobacco Use   Smoking status: Never   Smokeless tobacco: Never  Vaping Use   Vaping Use:  Never used  Substance and Sexual Activity   Alcohol use: Yes    Comment: 1 - 2 a year   Drug use: No   Sexual activity: Yes  Other Topics Concern   Not on file  Social History Narrative   Occupation:  [name]   Worked 11- 8 am  Now changed job and shift to [employer]  Mon through Thursday    Working  Ft 40 hours mostly   Married '10 10 10   ' Wife s/p bariatric surgery pt   Regular exercise- yes   Pt does have children   Father died suddenly 05/10/08   Mom passes 2017  Acute rep disease after acute renal failure   Daily caffeine use one a day   2 dogs and cat.       Right Handed             Social Determinants of Health   Financial Resource Strain: Medium Risk   Difficulty of Paying Living Expenses: Somewhat hard  Food Insecurity: Food Insecurity Present   Worried About Running Out of Food in the Last Year: Sometimes true   Ran Out of Food in the Last Year: Never true  Transportation Needs: No Transportation Needs   Lack of Transportation (Medical): No   Lack of Transportation (Non-Medical): No  Physical Activity: Insufficiently Active   Days of Exercise per Week: 3 days   Minutes of Exercise per Session: 40 min  Stress: No Stress Concern Present   Feeling of Stress : Only a little  Social Connections: Unknown   Frequency of Communication with Friends and Family: Twice a week   Frequency of Social Gatherings with Friends and Family: Not on file   Attends Religious Services: Never   Marine scientist or Organizations: No   Attends Music therapist: Not on file   Marital Status: Married  Human resources officer Violence: Not on file   Current Outpatient Medications on File Prior to Visit  Medication Sig Dispense Refill   albuterol (PROVENTIL HFA;VENTOLIN HFA) 108 (90 Base) MCG/ACT inhaler Inhale 2 puffs into the lungs every 6 (six) hours as needed. For wheezing 1 Inhaler 2   apixaban (ELIQUIS) 5 MG TABS tablet TAKE 1 TABLET(5 MG) BY MOUTH TWICE DAILY 180  tablet 1   atorvastatin (LIPITOR) 40 MG tablet TAKE 1 TABLET DAILY 90 tablet 2   cholecalciferol (VITAMIN D3) 25 MCG (1000 UNIT) tablet Take 1,000 Units by mouth daily.     Continuous Blood Gluc Transmit (DEXCOM G6 TRANSMITTER) MISC 1 Device by Does not apply route every 3 (three) months. 1 each 3   Cyanocobalamin (B-12) 5000 MCG CAPS Take 5,000 mcg by mouth daily. Takes every other day     empagliflozin (JARDIANCE) 25 MG TABS tablet Take 1 tablet (25 mg total) by mouth daily before breakfast. 90 tablet 3   insulin aspart (NOVOLOG) 100 UNIT/ML injection Use 80 units a day in the insulin pump 90 mL 3   Insulin Disposable Pump (V-GO 30) KIT Use 1 a day 90 kit 3   metFORMIN (GLUCOPHAGE-XR) 500 MG 24 hr tablet TAKE 4 TABLETS DAILY WITH BREAKFAST 360 tablet 3   ofloxacin (FLOXIN) 0.3 % OTIC solution Place 10 drops into the right ear daily. 10 mL 0   Semaglutide, 1 MG/DOSE, 4 MG/3ML SOPN Inject 1 mg as directed once a week. 9 mL 3   No current facility-administered medications on file prior to visit.   No Known Allergies Family History  Problem Relation Age of Onset   Heart disease Mother    Other Mother        clotting disorders   Diabetes Mother    High blood pressure Mother    Kidney disease Mother    Depression Mother    Sleep apnea Mother    Obesity Mother    Other Father  sudden death/cervical and lumbar disk disease   Aneurysm Father        In his 87s smoked   Hyperlipidemia Father    High blood pressure Father    Sudden death Father    Alcoholism Father    Hypertension Other    Allergies Other    Deep vein thrombosis Other    Sleep apnea Other    Obesity Other     Objective:   Physical Exam BP 118/82 (BP Location: Left Arm, Patient Position: Sitting, Cuff Size: Normal)   [name] 92   Ht '5\' 11"'  (1.803 m)   Wt 283 lb 4.8 oz (128.5 kg)   SpO2 95%   BMI 39.51 kg/m   Wt Readings from Last 3 Encounters:  08/08/21 283 lb 4.8 oz (128.5 kg)  06/05/21 288 lb 3.2 oz  (130.7 kg)  03/30/21 287 lb (130.2 kg)   Constitutional: overweight, in NAD Eyes: PERRLA, EOMI, no exophthalmos ENT: moist mucous membranes, no thyromegaly, no cervical lymphadenopathy Cardiovascular: RRR, No MRG, + RLE swelling -chronic, after his DVT  Respiratory: CTA B Musculoskeletal: no deformities, strength intact in all 4 Skin: moist, warm, no rashes Neurological: + Mild tremors with outstretched hands, DTR normal in all 4  Assessment:     1. DM2, insulin-dependent, uncontrolled, with complications - DR  2. Obesity class 3 BMI Classification: < 18.5 underweight  18.5-24.9 normal weight  25.0-29.9 overweight  30.0-34.9 class I obesity  35.0-39.9 class II obesity  ? 40.0 class III obesity   3. HL  4.  History of syncopal episode  Plan:     1. Patient with history of uncontrolled diabetes, on oral antidiabetic regimen with metformin, SGLT2 inhibitor, and also weekly GLP-1 receptor agonist along with a VGo patch pump.  His diabetes improved after he started to lose weight and working with the weight management clinic.  At last visit, sugars were more variable and overall higher than before, especially overnight, since sugars after dinner were higher.  Upon questioning, he was out of Ozempic for 3 weeks and only restarted it 3 weeks prior to our appointment.  We increased his insulin with lunch and dinner by 2 units.  We had data loss between 7:30 AM and 1 PM we discussed about scanning the device more frequently.  We did not increase Ozempic since the higher dose is on backorder at that time. CGM interpretation: -At today's visit, we reviewed his CGM downloads: It appears that 79% of values are in target range (goal >70%), while 20% are higher than 180 (goal <25%), and <1% are lower than 70 (goal <4%).  The calculated average blood sugar is 152.  The projected HbA1c for the next 3 months (GMI) is 7.0%. -Reviewing the CGM trends, the vast majority of the blood sugars are at goal.   However, overnight, blood sugars are higher, staying in the upper range of the target interval.  It appears that the sugars stay high after dinner so we discussed about bolusing slightly more with this meal.  I did advise him that for larger meals and most of his dinners, he can use 8 clicks.  He is not usually using this dose now.  Otherwise, we can continue the same regimen. - I advised him to: Patient Instructions  Please continue: - Metformin XR 2000 mg at dinnertime - Jardiance 25 mg before b'fast  - PRX45 4-6 clicks per meal - but use 8 clicks for larger meals - Ozempic 1 mg weekly  Please return  in 4 months.  - we checked his HbA1c: 6.9% (lower) - advised to check sugars at different times of the day - 4x a day, rotating check times - advised for yearly eye exams >> he is not UTD - I cannot perform his foot exam today since his right foot is in a boot.  Since he sees podiatry frequently, I advised him to try to have the podiatrist do the full foot exam when the boot comes off. - return to clinic in 4 months  2. Obesity class 3 -continue SGLT 2 inhibitor and GLP-1 receptor agonist which should also help with weight loss -he continues to be followed in the weight management clinic -He lost 4 pounds since last visit  3. HL -Reviewed latest lipid panel from 08/2020: all Fractions abnormal: Lab Results  Component Value Date   CHOL 160 09/06/2020   HDL 36 (L) 09/06/2020   LDLCALC 87 09/06/2020   LDLDIRECT 125.0 12/26/2018   TRIG 220 (H) 09/06/2020   CHOLHDL 4.4 09/06/2020  -He continues on Lipitor 40 mg daily without side effects   Philemon Kingdom, MD PhD [facility] Endocrinology

## 2021-08-08 NOTE — Patient Instructions (Addendum)
Please continue: - Metformin XR 2000 mg at dinnertime - Jardiance 25 mg before b'fast  - QJS47 4-6 clicks per meal - but use 8 clicks for larger meals including most dinners - Ozempic 1 mg weekly  Please return in 4 months.

## 2021-09-06 ENCOUNTER — Telehealth: Payer: BC Managed Care – PPO | Admitting: Physician Assistant

## 2021-09-06 DIAGNOSIS — K529 Noninfective gastroenteritis and colitis, unspecified: Secondary | ICD-10-CM

## 2021-09-06 MED ORDER — CIPROFLOXACIN HCL 500 MG PO TABS: 500.0000 mg | ORAL_TABLET | Freq: Two times a day (BID) | ORAL | 0 refills | Status: AC

## 2021-09-06 NOTE — Patient Instructions (Signed)
Troy [name], thank you for joining Leeanne Rio, PA-C for today's virtual visit.  While this provider is not your primary care provider (PCP), if your PCP is located in our provider database this encounter information will be shared with them immediately following your visit.  Consent: (Patient) Troy [name] provided verbal consent for this virtual visit at the beginning of the encounter.  Current Medications:  Current Outpatient Medications:    albuterol (PROVENTIL HFA;VENTOLIN HFA) 108 (90 Base) MCG/ACT inhaler, Inhale 2 puffs into the lungs every 6 (six) hours as needed. For wheezing, Disp: 1 Inhaler, Rfl: 2   apixaban (ELIQUIS) 5 MG TABS tablet, TAKE 1 TABLET(5 MG) BY MOUTH TWICE DAILY, Disp: 180 tablet, Rfl: 1   atorvastatin (LIPITOR) 40 MG tablet, TAKE 1 TABLET DAILY, Disp: 90 tablet, Rfl: 2   cholecalciferol (VITAMIN D3) 25 MCG (1000 UNIT) tablet, Take 1,000 Units by mouth daily., Disp: , Rfl:    Continuous Blood Gluc Transmit (DEXCOM G6 TRANSMITTER) MISC, 1 Device by Does not apply route every 3 (three) months., Disp: 1 each, Rfl: 3   empagliflozin (JARDIANCE) 25 MG TABS tablet, Take 1 tablet (25 mg total) by mouth daily before breakfast., Disp: 90 tablet, Rfl: 3   insulin aspart (NOVOLOG) 100 UNIT/ML injection, Use 80 units a day in the insulin pump, Disp: 90 mL, Rfl: 3   Insulin Disposable Pump (V-GO 30) KIT, Use 1 a day, Disp: 90 kit, Rfl: 3   metFORMIN (GLUCOPHAGE-XR) 500 MG 24 hr tablet, TAKE 4 TABLETS DAILY WITH BREAKFAST, Disp: 360 tablet, Rfl: 3   Semaglutide, 1 MG/DOSE, 4 MG/3ML SOPN, Inject 1 mg as directed once a week., Disp: 9 mL, Rfl: 3   Medications ordered in this encounter:  No orders of the defined types were placed in this encounter.    *If you need refills on other medications prior to your next appointment, please contact your pharmacy*  Follow-Up: Call back or seek an in-person evaluation if the symptoms worsen or if the condition fails to  improve as anticipated.  Other Instructions Continue to hydrate. Start a daily probiotic and dietary recommendations below.  Take the antibiotic as directed. If not easing up over the next few days and resolving with antibiotic, or if any new/worsening symptoms, you need in-person evaluation.  I do recommend holding your Jardiance while still having frequent stools as I do not want you to get dehydrated. Closely monitor glucose levels. If getting > 250 non-fasting  please call PCP.   Food Choices to Help Relieve Diarrhea, Adult Diarrhea can make you feel weak and cause you to become dehydrated. It is important to choose the right foods and drinks to: Relieve diarrhea. Replace lost fluids and nutrients. Prevent dehydration. What are tips for following this plan? Relieving diarrhea Avoid foods that make your diarrhea worse. These may include: Foods and beverages sweetened with high-fructose corn syrup, honey, or sweeteners such as xylitol, sorbitol, and mannitol. Fried, greasy, or spicy foods. Raw fruits and vegetables. Eat foods that are rich in probiotics. These include foods such as yogurt and fermented milk products. Probiotics can help increase healthy bacteria in your stomach and intestines (gastrointestinal tract or GI tract). This may help digestion and stop diarrhea. If you have lactose intolerance, avoid dairy products. These may make your diarrhea worse. Take medicine to help stop diarrhea only as told by your health care provider. Replacing nutrients  Eat bland, easy-to-digest foods in small amounts as you are able, until your diarrhea starts  to get better. These foods include bananas, applesauce, rice, toast, and crackers. Gradually reintroduce nutrient-rich foods as tolerated or as told by your health care provider. This includes: Well-cooked protein foods, such as eggs, lean meats like fish or chicken without skin, and tofu. Peeled, seeded, and soft-cooked fruits and  vegetables. Low-fat dairy products. Whole grains. Take vitamin and mineral supplements as told by your health care provider. Preventing dehydration  Start by sipping water or a solution to prevent dehydration (oral rehydration solution, ORS). This is a drink that helps replace fluids and minerals your body has lost. You can buy an ORS at pharmacies and retail stores. Try to drink at least 8-10 cups (2,000-2,500 mL) of fluid each day to help replace lost fluids. If you have urine that is pale yellow, you are getting enough fluids. You may drink other liquids in addition to water, such as fruit juice that you have added water to (diluted fruit juice) or low-calorie sports drinks, as tolerated or as told by your health care provider. Avoid drinks with caffeine, such as coffee, tea, or soft drinks. Avoid alcohol. Summary When you have diarrhea, it is important to choose the right foods and drinks to relieve diarrhea, to replace lost fluids and nutrients, and to prevent dehydration. Make sure you drink enough fluid to keep your urine pale yellow. You may benefit from eating bland foods at first. Gradually reintroduce healthy, nutrient-rich foods as tolerated or as told by your health care provider. Avoid foods that make your diarrhea worse, such as fried, greasy, or spicy foods. This information is not intended to replace advice given to you by your health care provider. Make sure you discuss any questions you have with your health care provider. Document Revised: 04/14/2019 Document Reviewed: 04/14/2019 Elsevier Patient Education  [place].    If you have been instructed to have an in-person evaluation today at a local Urgent Care facility, please use the link below. It will take you to a list of all of our available [facility], including address, phone number and hours of operation. Please do not delay care.  [facility]  If you or a family member do not  have a primary care provider, use the link below to schedule a visit and establish care. When you choose a [facility] primary care physician or advanced practice provider, you gain a long-term partner in health. Find a Primary Care Provider  Learn more about [facility]'s in-office and virtual care options: [facility]

## 2021-09-06 NOTE — Progress Notes (Signed)
Virtual Visit Consent   Troy [name], you are scheduled for a virtual visit with a [facility] provider today. Just as with appointments in the office, your consent must be obtained to participate. Your consent will be active for this visit and any virtual visit you may have with one of our providers in the next 365 days. If you have a MyChart account, a copy of this consent can be sent to you electronically.  As this is a virtual visit, video technology does not allow for your provider to perform a traditional examination. This may limit your provider's ability to fully assess your condition. If your provider identifies any concerns that need to be evaluated in person or the need to arrange testing (such as labs, EKG, etc.), we will make arrangements to do so. Although advances in technology are sophisticated, we cannot ensure that it will always work on either your end or our end. If the connection with a video visit is poor, the visit may have to be switched to a telephone visit. With either a video or telephone visit, we are not always able to ensure that we have a secure connection.  By engaging in this virtual visit, you consent to the provision of healthcare and authorize for your insurance to be billed (if applicable) for the services provided during this visit. Depending on your insurance coverage, you may receive a charge related to this service.  I need to obtain your verbal consent now. Are you willing to proceed with your visit today? Troy [name] has provided verbal consent on 09/06/2021 for a virtual visit (video or telephone). Leeanne Rio, [place]  Date: 09/06/2021 10:10 AM  Virtual Visit via Video Note   I, Leeanne Rio, connected with  Troy [name]  ([id number], Feb 23, 1971) on 09/06/21 at  9:45 AM EDT by a video-enabled telemedicine application and verified that I am speaking with the correct person using two identifiers.  Location: Patient: Virtual Visit  Location Patient: Home Provider: Virtual Visit Location Provider: Home Office   I discussed the limitations of evaluation and management by telemedicine and the availability of in person appointments. The patient expressed understanding and agreed to proceed.    History of Present Illness: Troy [name] is a 51 y.o. who identifies as a male who was assigned male at birth, and is being seen today for diarrhea over the past week or so after returning from a 10-day cruise. Notes symptoms starting on the last 2 days of his cruise initially with fever, chills, sweats followed by consistent diarrhea. Denies any vomiting thankfully. Occasional nausea and cramping. Has had significant fecal urgency, having had a few accidents as he could not make it to bathroom in time.  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 Has been drinking water and Gatorade. Is able to tolerate solid foods but will "run right through him". Tried to eat a piece of meatloaf for breakfast today with subsequent diarrhea. Notes he has chronically loose stools (negative prior workups) but nothing like this. Denies rice water stool.    HPI: HPI  Problems:  Patient Active Problem List   Diagnosis Date Noted   Syncope 03/23/2021   Elevated hemoglobin ([name]) 01/16/2021   Orthostatic dizzinesshypotension 11/24/2020   Mandible pain 07/13/2020   Diarrhea 05/16/2020   Cervical radiculitis 04/12/2020   Constipation 03/31/2020   Cervicalgia 02/29/2020   Radiculopathy 01/25/2020   Hyperlipidemia associated with type 2 diabetes mellitus ([place]) 12/22/2019  Vitamin D deficiency 09/08/2019   Low HDL (under 40) 09/08/2019   Pancreatic insufficiency 03/28/2018   Type 2 diabetes mellitus with complication, with long-term current use of insulin ([place]) 09/16/2017   DVT (deep venous thrombosis), right 04/28/2015   Acute deep vein thrombosis (DVT) of femoral vein of right lower extremity ([place]) 04/28/2015   Hemorrhoid  03/23/2013   Varicose vein of leg right 03/23/2013   Visit for preventive health examination 03/23/2013   Diastasis recti 12/22/2012   Tendinitis of right shoulder 09/27/2011   Right shoulder pain 09/27/2011   Allergic rhinitis, cause unspecified 05/23/2011   Sleep disturbance, unspecified 02/21/2011   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   Preventative health care 12/05/2010   Chronic diarrhea of unknown origin pt says not from metformin 12/05/2010   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   URINARY URGENCY 01/21/2009   Hyperlipidemia 01/23/2008   Obstructive sleep apnea 12/22/2007   VARICOSE VEINS, LOWER EXTREMITIES 10/03/2007   ASTHMA 09/19/2007   Mild intermittent asthma 09/19/2007   PLANTAR FASCIITIS, LEFT 06/23/2007   RASH AND OTHER NONSPECIFIC SKIN ERUPTION 05/26/2007   EDEMA 05/26/2007   Class 2 severe obesity with serious comorbidity and body mass index (BMI) of 39.0 to 39.9 in adult ([place]) 02/13/2007   DVT, HX OF 01/15/2007   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    Allergies: No Known Allergies Medications:  Current Outpatient Medications:    ciprofloxacin (CIPRO) 500 MG tablet, Take 1 tablet (500 mg total) by mouth 2 (two) times daily for 7 days., Disp: 14 tablet, Rfl: 0   albuterol (PROVENTIL HFA;VENTOLIN HFA) 108 (90 Base) MCG/ACT inhaler, Inhale 2 puffs into the lungs every 6 (six) hours as needed. For wheezing, Disp: 1 Inhaler, Rfl: 2   apixaban (ELIQUIS) 5 MG TABS tablet, TAKE 1 TABLET(5 MG) BY MOUTH TWICE DAILY, Disp: 180 tablet, Rfl: 1   atorvastatin (LIPITOR) 40 MG tablet, TAKE 1 TABLET DAILY, Disp: 90 tablet, Rfl: 2   cholecalciferol (VITAMIN D3) 25 MCG (1000 UNIT) tablet, Take 1,000 Units by mouth daily., Disp: , Rfl:    Continuous Blood Gluc Transmit (DEXCOM G6 TRANSMITTER) MISC, 1 Device by Does not apply route every 3 (three) months., Disp: 1 each, Rfl: 3   empagliflozin (JARDIANCE) 25 MG TABS tablet, Take 1 tablet (25 mg total) by mouth daily before breakfast.,  Disp: 90 tablet, Rfl: 3   insulin aspart (NOVOLOG) 100 UNIT/ML injection, Use 80 units a day in the insulin pump, Disp: 90 mL, Rfl: 3   Insulin Disposable Pump (V-GO 30) KIT, Use 1 a day, Disp: 90 kit, Rfl: 3   metFORMIN (GLUCOPHAGE-XR) 500 MG 24 hr tablet, TAKE 4 TABLETS DAILY WITH BREAKFAST, Disp: 360 tablet, Rfl: 3   Semaglutide, 1 MG/DOSE, 4 MG/3ML SOPN, Inject 1 mg as directed once a week., Disp: 9 mL, Rfl: 3  Observations/Objective: Patient is well-developed, well-nourished in no acute distress.  Resting comfortably on couch at home.  Head is normocephalic, atraumatic.  No labored breathing. Speech is clear and coherent with logical content.  Patient is alert and oriented at baseline. .  Assessment and Plan: 1. Gastroenteritis - ciprofloxacin (CIPRO) 500 MG tablet; Take 1 tablet (500 mg total) by mouth 2 (two) times daily for 7 days.  Dispense: 14 tablet; Refill: 0  Ongoing for 10 days. Fever has resolved but still with substantial symptoms. Thankfully is able to stay hydrated. Discussed may want to hold Jardiance for a few days until we get this slowed down further, keeping close watch on his  glucose levels. Start [employer]. Cipro per orders due to concern for bacterial cause at this point. Begin daily probiotic. Strict PCP follow-up discussed.   Follow Up Instructions: I discussed the assessment and treatment plan with the patient. The patient was provided an opportunity to ask questions and all were answered. The patient agreed with the plan and demonstrated an understanding of the instructions.  A copy of instructions were sent to the patient via MyChart unless otherwise noted below.   The patient was advised to call back or seek an in-person evaluation if the symptoms worsen or if the condition fails to improve as anticipated.  Time:  I spent 10 minutes with the patient via telehealth technology discussing the above problems/concerns.    Leeanne Rio, PA-C

## 2021-09-18 ENCOUNTER — Ambulatory Visit (INDEPENDENT_AMBULATORY_CARE_PROVIDER_SITE_OTHER): Payer: BC Managed Care – PPO | Admitting: Family Medicine

## 2021-09-18 ENCOUNTER — Encounter (INDEPENDENT_AMBULATORY_CARE_PROVIDER_SITE_OTHER): Payer: Self-pay | Admitting: Family Medicine

## 2021-09-18 VITALS — BP 97/63 | HR 88 | Temp 97.7°F | Ht 71.0 in | Wt 283.0 lb

## 2021-09-18 DIAGNOSIS — Z7985 Long-term (current) use of injectable non-insulin antidiabetic drugs: Secondary | ICD-10-CM | POA: Diagnosis not present

## 2021-09-18 DIAGNOSIS — Z6839 Body mass index (BMI) 39.0-39.9, adult: Secondary | ICD-10-CM

## 2021-09-18 DIAGNOSIS — E669 Obesity, unspecified: Secondary | ICD-10-CM | POA: Diagnosis not present

## 2021-09-18 DIAGNOSIS — E118 Type 2 diabetes mellitus with unspecified complications: Secondary | ICD-10-CM

## 2021-09-18 DIAGNOSIS — Z794 Long term (current) use of insulin: Secondary | ICD-10-CM

## 2021-09-18 NOTE — Progress Notes (Unsigned)
Chief Complaint:   OBESITY Troy [name] is here to discuss his progress with his obesity treatment plan along with follow-up of his obesity related diagnoses. Troy [name] is on the Category 3 Plan and states he is following his eating plan approximately 90% of the time. [name] states he is walking for 15-20 minutes 3-4 times per week.  Today's visit was #: 26 Starting weight: 293 lbs Starting date: 09/07/2019 Today's weight: 283 lbs Today's date: 09/18/2021 Total lbs lost to date: 10 Total lbs lost since last in-office visit: 0  Interim History: Troy [name] did some celebrating eating while on an [place] cruise.  He has been working on increasing his water intake and he had increased simple carbs when he had a GI bug.  He has a diagnosis of POTS and he is trying to stay hydrated but get electrolytes and as well.  Subjective:   1. Type 2 diabetes mellitus with complication, with long-term current use of insulin ([name]) Troy [name] states his fasting glucose mostly ranges in the 150s.  He uses a Dexcom and he notes more consistent glucose readings with minimal excursions.  He had been out of Ozempic due to shortages, but he should restart soon.  Assessment/Plan:   1. Type 2 diabetes mellitus with complication, with long-term current use of insulin ([name]) Troy [name] will continue with his diet, exercise, and weight loss.  We will follow-up at his next visit.  2. Obesity, Current BMI 39.6 Troy [name] is currently in the action stage of change. As such, his goal is to continue with weight loss efforts. He has agreed to the Category 3 Plan.   Troy [name] is to continue to hydrate and drink electrolytes, and avoid heat and exercise while it is above 90 degrees outside.  Exercise goals: As is.   Behavioral modification strategies: increasing lean protein intake.  Troy [name] has agreed to follow-up with our clinic in 4 to 6 weeks. He was informed of the importance of frequent follow-up visits to maximize his success  with intensive lifestyle modifications for his multiple health conditions.   Objective:   Blood pressure 97/63, pulse 88, temperature 97.7 F (36.5 C), height '5\' 11"'$  (1.803 m), weight 283 lb (128.4 kg), SpO2 96 %. Body mass index is 39.47 kg/m.  General: Cooperative, alert, well developed, in no acute distress. HEENT: Conjunctivae and lids unremarkable. Cardiovascular: Regular rhythm.  Lungs: Normal work of breathing. Neurologic: No focal deficits.   Lab Results  Component Value Date   CREATININE 0.76 03/23/2021   BUN 17 03/23/2021   NA 141 03/23/2021   K 4.4 03/23/2021   CL 105 03/23/2021   CO2 24 03/23/2021   Lab Results  Component Value Date   ALT 23 03/23/2021   AST 15 03/23/2021   ALKPHOS 70 03/23/2021   BILITOT 0.7 03/23/2021   Lab Results  Component Value Date   HGBA1C 6.9 (A) 08/08/2021   HGBA1C 7.9 (H) 03/23/2021   HGBA1C 6.7 (A) 11/18/2020   HGBA1C 8.2 (H) 09/06/2020   HGBA1C 6.7 (H) 05/25/2020   Lab Results  Component Value Date   INSULIN 7.9 09/06/2020   Lab Results  Component Value Date   TSH 1.06 02/06/2021   Lab Results  Component Value Date   CHOL 160 09/06/2020   HDL 36 (L) 09/06/2020   LDLCALC 87 09/06/2020   LDLDIRECT 125.0 12/26/2018   TRIG 220 (H) 09/06/2020   CHOLHDL 4.4 09/06/2020   Lab Results  Component Value Date   VD25OH 70.2 03/23/2021   VD25OH  55.3 09/06/2020   VD25OH 63.0 06/13/2020   Lab Results  Component Value Date   WBC 9.6 10/31/2020   HGB 16.3 10/31/2020   HCT 47.1 10/31/2020   MCV 94.4 10/31/2020   PLT 248 10/31/2020   Lab Results  Component Value Date   IRON 144 02/08/2020   TIBC 358 02/08/2020   FERRITIN 96 02/08/2020   Attestation Statements:   Reviewed by clinician on day of visit: allergies, medications, problem list, medical history, surgical history, family history, social history, and previous encounter notes.  Time spent on visit including pre-visit chart review and post-visit care and  charting was 42 minutes.   I, Trixie Dredge, am acting as transcriptionist for Dennard Nip, MD.  I have reviewed the above documentation for accuracy and completeness, and I agree with the above. -  Dennard Nip, MD

## 2021-10-01 ENCOUNTER — Other Ambulatory Visit: Payer: Self-pay | Admitting: Internal Medicine

## 2021-10-03 ENCOUNTER — Encounter: Payer: Self-pay | Admitting: Internal Medicine

## 2021-10-04 ENCOUNTER — Other Ambulatory Visit: Payer: Self-pay

## 2021-10-04 MED ORDER — APIXABAN 5 MG PO TABS: ORAL_TABLET | ORAL | 2 refills | Status: DC

## 2021-10-04 MED ORDER — ATORVASTATIN CALCIUM 40 MG PO TABS: ORAL_TABLET | ORAL | 2 refills | Status: DC

## 2021-10-04 NOTE — Telephone Encounter (Signed)
Both Rx sent to the pharmacy

## 2021-10-04 NOTE — Telephone Encounter (Signed)
Please renew  both for 9 months until due for yearly check

## 2021-10-18 ENCOUNTER — Encounter (INDEPENDENT_AMBULATORY_CARE_PROVIDER_SITE_OTHER): Payer: Self-pay

## 2021-10-31 ENCOUNTER — Ambulatory Visit (INDEPENDENT_AMBULATORY_CARE_PROVIDER_SITE_OTHER): Payer: BC Managed Care – PPO | Admitting: Family Medicine

## 2021-10-31 ENCOUNTER — Encounter (INDEPENDENT_AMBULATORY_CARE_PROVIDER_SITE_OTHER): Payer: Self-pay | Admitting: Family Medicine

## 2021-10-31 VITALS — BP 104/66 | HR 89 | Temp 98.0°F | Ht 71.0 in | Wt 288.0 lb

## 2021-10-31 DIAGNOSIS — E669 Obesity, unspecified: Secondary | ICD-10-CM

## 2021-10-31 DIAGNOSIS — Z7985 Long-term (current) use of injectable non-insulin antidiabetic drugs: Secondary | ICD-10-CM

## 2021-10-31 DIAGNOSIS — Z6841 Body Mass Index (BMI) 40.0 and over, adult: Secondary | ICD-10-CM | POA: Diagnosis not present

## 2021-10-31 DIAGNOSIS — E118 Type 2 diabetes mellitus with unspecified complications: Secondary | ICD-10-CM | POA: Diagnosis not present

## 2021-10-31 DIAGNOSIS — F32A Depression, unspecified: Secondary | ICD-10-CM | POA: Insufficient documentation

## 2021-10-31 DIAGNOSIS — Z794 Long term (current) use of insulin: Secondary | ICD-10-CM

## 2021-10-31 LAB — HM DIABETES EYE EXAM

## 2021-11-02 ENCOUNTER — Encounter (INDEPENDENT_AMBULATORY_CARE_PROVIDER_SITE_OTHER): Payer: Self-pay | Admitting: Family Medicine

## 2021-11-07 ENCOUNTER — Other Ambulatory Visit: Payer: Self-pay | Admitting: Internal Medicine

## 2021-11-07 ENCOUNTER — Telehealth (INDEPENDENT_AMBULATORY_CARE_PROVIDER_SITE_OTHER): Payer: Self-pay | Admitting: Family Medicine

## 2021-11-07 MED ORDER — SEMAGLUTIDE (2 MG/DOSE) 8 MG/3ML ~~LOC~~ SOPN: 2.0000 mg | PEN_INJECTOR | SUBCUTANEOUS | 0 refills | Status: DC

## 2021-11-07 MED ORDER — ESCITALOPRAM OXALATE 10 MG PO TABS: 10.0000 mg | ORAL_TABLET | Freq: Every day | ORAL | 0 refills | Status: DC

## 2021-11-07 NOTE — Telephone Encounter (Signed)
Please send to the pharmacy he wants

## 2021-11-07 NOTE — Telephone Encounter (Signed)
Patient states that after his appt. Dr. Leafy Ro prescribed a medication that should have been sent to [facility] on [address] in [facility]. Pt needs med sent.

## 2021-11-07 NOTE — Telephone Encounter (Signed)
Called and left message for patient. Informed patient prescriptions has been sent to the pharmacy. Patient was also sent a Mychart message.

## 2021-11-09 NOTE — Progress Notes (Signed)
Chief Complaint:   OBESITY Troy [name] is here to discuss his progress with his obesity treatment plan along with follow-up of his obesity related diagnoses. Troy [name] is on the Category 3 Plan and states he is following his eating plan approximately 60% of the time. Troy [name] states he is walking for 20-30 minutes 5 times per week.  Today's visit was #: 68 Starting weight: 293 lbs Starting date: 09/07/2019 Today's weight: 288 lbs Today's date: 10/31/2021 Total lbs lost to date: 5 Total lbs lost since last in-office visit: 0  Interim History: [name] has had a lot of stress recently and he hasn't been able to concentrate on meal planning. He has gained some weight, but he has tried to be mindful. His hunger has still been an issues.   Subjective:   1. Type 2 diabetes mellitus with complication, with long-term current use of insulin ([name]) Troy [name]'s last A1c was 6.9. He is still struggles with polyphagia.   2. Depression, unspecified depression type Troy [name] has had increased stress, but he also notes increased irritability and he wants to make sure this doesn't affect his marriage.   Assessment/Plan:   1. Type 2 diabetes mellitus with complication, with long-term current use of insulin ([place]) Troy [name] agreed to increase Ozempic to 2 mg once weekly, and we will for 90 days. He will continue his other medications.  - Semaglutide, 2 MG/DOSE, 8 MG/3ML SOPN; Inject 2 mg as directed once a week.  Dispense: 9 mL; Refill: 0  2. Depression, unspecified depression type Troy [name] agreed to start Lexapro 10 mg q AM with no refills. Side effects were discussed.   - escitalopram (LEXAPRO) 10 MG tablet; Take 1 tablet (10 mg total) by mouth daily.  Dispense: 30 tablet; Refill: 0  3. Obesity, Current BMI 40.3 Troy [name] is currently in the action stage of change. As such, his goal is to continue with weight loss efforts. He has agreed to the Category 3 Plan.   Exercise goals: As is.   Behavioral  modification strategies: increasing lean protein intake, meal planning and cooking strategies, and emotional eating strategies.  Troy [name] has agreed to follow-up with our clinic in 4 weeks. He was informed of the importance of frequent follow-up visits to maximize his success with intensive lifestyle modifications for his multiple health conditions.   Objective:   Blood pressure 104/66, pulse 89, temperature 98 F (36.7 C), height '5\' 11"'$  (1.803 m), weight 288 lb (130.6 kg), SpO2 97 %. Body mass index is 40.17 kg/m.  General: Cooperative, alert, well developed, in no acute distress. HEENT: Conjunctivae and lids unremarkable. Cardiovascular: Regular rhythm.  Lungs: Normal work of breathing. Neurologic: No focal deficits.   Lab Results  Component Value Date   CREATININE 0.76 03/23/2021   BUN 17 03/23/2021   NA 141 03/23/2021   K 4.4 03/23/2021   CL 105 03/23/2021   CO2 24 03/23/2021   Lab Results  Component Value Date   ALT 23 03/23/2021   AST 15 03/23/2021   ALKPHOS 70 03/23/2021   BILITOT 0.7 03/23/2021   Lab Results  Component Value Date   HGBA1C 6.9 (A) 08/08/2021   HGBA1C 7.9 (H) 03/23/2021   HGBA1C 6.7 (A) 11/18/2020   HGBA1C 8.2 (H) 09/06/2020   HGBA1C 6.7 (H) 05/25/2020   Lab Results  Component Value Date   INSULIN 7.9 09/06/2020   Lab Results  Component Value Date   TSH 1.06 02/06/2021   Lab Results  Component Value Date   CHOL 160 09/06/2020  HDL 36 (L) 09/06/2020   LDLCALC 87 09/06/2020   LDLDIRECT 125.0 12/26/2018   TRIG 220 (H) 09/06/2020   CHOLHDL 4.4 09/06/2020   Lab Results  Component Value Date   VD25OH 70.2 03/23/2021   VD25OH 55.3 09/06/2020   VD25OH 63.0 06/13/2020   Lab Results  Component Value Date   WBC 9.6 10/31/2020   HGB 16.3 10/31/2020   HCT 47.1 10/31/2020   MCV 94.4 10/31/2020   PLT 248 10/31/2020   Lab Results  Component Value Date   IRON 144 02/08/2020   TIBC 358 02/08/2020   FERRITIN 96 02/08/2020   Attestation  Statements:   Reviewed by clinician on day of visit: allergies, medications, problem list, medical history, surgical history, family history, social history, and previous encounter notes.   I, Trixie Dredge, am acting as transcriptionist for Dennard Nip, MD.  I have reviewed the above documentation for accuracy and completeness, and I agree with the above. -  Dennard Nip, MD

## 2021-11-29 ENCOUNTER — Other Ambulatory Visit (INDEPENDENT_AMBULATORY_CARE_PROVIDER_SITE_OTHER): Payer: Self-pay | Admitting: Family Medicine

## 2021-11-29 DIAGNOSIS — F32A Depression, unspecified: Secondary | ICD-10-CM

## 2021-11-30 ENCOUNTER — Telehealth (INDEPENDENT_AMBULATORY_CARE_PROVIDER_SITE_OTHER): Payer: Self-pay | Admitting: *Deleted

## 2021-11-30 ENCOUNTER — Telehealth (INDEPENDENT_AMBULATORY_CARE_PROVIDER_SITE_OTHER): Payer: BC Managed Care – PPO | Admitting: Family Medicine

## 2021-11-30 DIAGNOSIS — F3289 Other specified depressive episodes: Secondary | ICD-10-CM

## 2021-11-30 DIAGNOSIS — E669 Obesity, unspecified: Secondary | ICD-10-CM | POA: Diagnosis not present

## 2021-11-30 DIAGNOSIS — J069 Acute upper respiratory infection, unspecified: Secondary | ICD-10-CM | POA: Diagnosis not present

## 2021-11-30 DIAGNOSIS — Z6841 Body Mass Index (BMI) 40.0 and over, adult: Secondary | ICD-10-CM | POA: Diagnosis not present

## 2021-11-30 MED ORDER — ESCITALOPRAM OXALATE 10 MG PO TABS: 10.0000 mg | ORAL_TABLET | Freq: Every day | ORAL | 0 refills | Status: DC

## 2021-11-30 NOTE — Telephone Encounter (Signed)
Left patient a message to call back to answer pre video visit questions.

## 2021-12-01 DIAGNOSIS — Z20822 Contact with and (suspected) exposure to covid-19: Secondary | ICD-10-CM | POA: Diagnosis not present

## 2021-12-01 DIAGNOSIS — J069 Acute upper respiratory infection, unspecified: Secondary | ICD-10-CM | POA: Diagnosis not present

## 2021-12-05 NOTE — Progress Notes (Unsigned)
TeleHealth Visit:  Due to the COVID-19 pandemic, this visit was completed with telemedicine (audio/video) technology to reduce patient and provider exposure as well as to preserve personal protective equipment.   Troy [name] has verbally consented to this TeleHealth visit. The patient is located at home, the provider is located at the [facility] office. The participants in this visit include the listed provider and patient. The visit was conducted today via MyChart video.   Chief Complaint: OBESITY Troy [name] is here to discuss his progress with his obesity treatment plan along with follow-up of his obesity related diagnoses. Kaitlyn is on the Category 3 Plan and states he is following his eating plan approximately (unknown)% of the time. Troy [name] states he is doing 0 minutes 0 times per week.  Today's visit was #: 28 Starting weight: 293 lbs Starting date: 09/07/2019  Interim History: Troy [name] has been having cold symptoms and changed his visit to virtual. He has done well with his diet and he has lost another 2 lbs. He is working on meal planning and prepping.   Subjective:   1. Viral URI Troy [name] has had sinus congestion for approximately 2 days. He had a negative COVID test, but he notes fatigue. He gets some relief with Mucinex OTC.   2. Other depression Troy [name] started Lexapro and she feels his mood has improved with decreased irritability. He has not been "snappy" at his wife as much. No side effects were noted.   Assessment/Plan:   1. Viral URI Troy [name] is on to continue Mucinex OTC as needed, and continue to hydrate.   2. Other depression Troy [name] will continue Lexapro 10 mg once daily, and we will refill for 1 month.   - escitalopram (LEXAPRO) 10 MG tablet; Take 1 tablet (10 mg total) by mouth daily.  Dispense: 30 tablet; Refill: 0  3. Obesity,current BMI 40.17 Troy [name] is currently in the action stage of change. As such, his goal is to continue with weight loss  efforts. He has agreed to the Category 3 Plan.   Behavioral modification strategies: increasing lean protein intake, increasing water intake, decreasing liquid calories, and no skipping meals.  Troy [name] has agreed to follow-up with our clinic in 3 weeks. He was informed of the importance of frequent follow-up visits to maximize his success with intensive lifestyle modifications for his multiple health conditions.  Objective:   VITALS: Per patient if applicable, see vitals. GENERAL: Alert and in no acute distress. CARDIOPULMONARY: No increased WOB. Speaking in clear sentences.  PSYCH: Pleasant and cooperative. Speech normal rate and rhythm. Affect is appropriate. Insight and judgement are appropriate. Attention is focused, linear, and appropriate.  NEURO: Oriented as arrived to appointment on time with no prompting.   Lab Results  Component Value Date   CREATININE 0.76 03/23/2021   BUN 17 03/23/2021   NA 141 03/23/2021   K 4.4 03/23/2021   CL 105 03/23/2021   CO2 24 03/23/2021   Lab Results  Component Value Date   ALT 23 03/23/2021   AST 15 03/23/2021   ALKPHOS 70 03/23/2021   BILITOT 0.7 03/23/2021   Lab Results  Component Value Date   HGBA1C 6.9 (A) 08/08/2021   HGBA1C 7.9 (H) 03/23/2021   HGBA1C 6.7 (A) 11/18/2020   HGBA1C 8.2 (H) 09/06/2020   HGBA1C 6.7 (H) 05/25/2020   Lab Results  Component Value Date   INSULIN 7.9 09/06/2020   Lab Results  Component Value Date   TSH 1.06 02/06/2021   Lab Results  Component Value Date   CHOL 160 09/06/2020   HDL 36 (L) 09/06/2020   LDLCALC 87 09/06/2020   LDLDIRECT 125.0 12/26/2018   TRIG 220 (H) 09/06/2020   CHOLHDL 4.4 09/06/2020   Lab Results  Component Value Date   VD25OH 70.2 03/23/2021   VD25OH 55.3 09/06/2020   VD25OH 63.0 06/13/2020   Lab Results  Component Value Date   WBC 9.6 10/31/2020   HGB 16.3 10/31/2020   HCT 47.1 10/31/2020   MCV 94.4 10/31/2020   PLT 248 10/31/2020   Lab Results  Component  Value Date   IRON 144 02/08/2020   TIBC 358 02/08/2020   FERRITIN 96 02/08/2020    Attestation Statements:   Reviewed by clinician on day of visit: allergies, medications, problem list, medical history, surgical history, family history, social history, and previous encounter notes.   I, Trixie Dredge, am acting as transcriptionist for Dennard Nip, MD.  I have reviewed the above documentation for accuracy and completeness, and I agree with the above. - Dennard Nip, MD

## 2021-12-06 ENCOUNTER — Other Ambulatory Visit (INDEPENDENT_AMBULATORY_CARE_PROVIDER_SITE_OTHER): Payer: Self-pay | Admitting: Family Medicine

## 2021-12-06 ENCOUNTER — Encounter (INDEPENDENT_AMBULATORY_CARE_PROVIDER_SITE_OTHER): Payer: Self-pay | Admitting: Family Medicine

## 2021-12-06 DIAGNOSIS — F3289 Other specified depressive episodes: Secondary | ICD-10-CM

## 2021-12-12 ENCOUNTER — Ambulatory Visit (INDEPENDENT_AMBULATORY_CARE_PROVIDER_SITE_OTHER): Payer: BC Managed Care – PPO | Admitting: Internal Medicine

## 2021-12-12 ENCOUNTER — Encounter: Payer: Self-pay | Admitting: Internal Medicine

## 2021-12-12 VITALS — BP 118/72 | HR 80 | Ht 71.0 in | Wt 292.0 lb

## 2021-12-12 DIAGNOSIS — E785 Hyperlipidemia, unspecified: Secondary | ICD-10-CM | POA: Diagnosis not present

## 2021-12-12 DIAGNOSIS — E11319 Type 2 diabetes mellitus with unspecified diabetic retinopathy without macular edema: Secondary | ICD-10-CM

## 2021-12-12 DIAGNOSIS — Z794 Long term (current) use of insulin: Secondary | ICD-10-CM

## 2021-12-12 DIAGNOSIS — Z6841 Body Mass Index (BMI) 40.0 and over, adult: Secondary | ICD-10-CM

## 2021-12-12 LAB — POCT GLYCOSYLATED HEMOGLOBIN (HGB A1C): Hemoglobin A1C: 7.4 % — AB (ref 4.0–5.6)

## 2021-12-12 MED ORDER — V-GO 30 30 UNIT/24HR KIT: 1.0000 | PACK | Freq: Every day | 3 refills | Status: DC

## 2021-12-12 MED ORDER — METFORMIN HCL ER 500 MG PO TB24: ORAL_TABLET | ORAL | 3 refills | Status: DC

## 2021-12-12 MED ORDER — DEXCOM G7 SENSOR MISC: 3.0000 | 4 refills | Status: DC

## 2021-12-12 MED ORDER — INSULIN ASPART 100 UNIT/ML IJ SOLN: INTRAMUSCULAR | 3 refills | Status: DC

## 2021-12-12 NOTE — Patient Instructions (Addendum)
Please continue: - Metformin XR 2000 mg at dinnertime - Jardiance 25 mg before b'fast  - RVA44 4-6 clicks per meal + use 8 clicks for larger meals  Please increase: - Ozempic 2 mg weekly  Please return in 4 months.

## 2021-12-12 NOTE — Progress Notes (Signed)
Today number Subjective:     Patient ID: Troy [name], male   DOB: September 07, 1970, 51 y.o.   MRN: [id number]  HPI [name] is a pleasant 51 y.o. man, returning for management of DM2, dx 2012, insulin-dependent, with complications (DR), uncontrolled.  Last visit 4 months ago.  Interim history: He continues to work with the weight management clinic.  He continues to have leg weakness. He was dx'ed with POTS before  last OV - improving. He had R 3rd toe fracture before last visit.  He sees podiatry. No increased urination, blurry vision, nausea, chest pain. He was started on Citalopram for presumed dx. Of andropause - feels much better - his irritability resolved. He has an URI.   Reviewed HbA1c levels: Lab Results  Component Value Date   HGBA1C 6.9 (A) 08/08/2021   HGBA1C 7.9 (H) 03/23/2021   HGBA1C 6.7 (A) 11/18/2020   He is on: - Metformin XR 2000 mg in a.m. - Invokana 100 mg >> Jardiance 25 mg before breakfast - VGo 40 >> 30 >> 20 >>VGo30 (ran out of NWG95) with 4-6 clicks per meal, but 8 units before a larger meal - Ozempic 0.5 mg weekly-started 06/2017 >> 1 mg weekly  He was on Victoza before >> no SEs.   He checks his sugars more than 4 times a day with his CGM:  Previously:   Previously:   Lowest: 49. Highest: 302 -  bolusing, [employer]  -No CKD: Lab Results  Component Value Date   BUN 17 03/23/2021   BUN 19 10/31/2020   CREATININE 0.76 03/23/2021   CREATININE 0.94 10/31/2020   No MAU: Lab Results  Component Value Date   MICRALBCREAT 6 02/08/2020   MICRALBCREAT 0.5 12/26/2018   MICRALBCREAT 1.4 07/22/2017   MICRALBCREAT 2.2 12/18/2016   MICRALBCREAT 5.8 10/18/2015   MICRALBCREAT 2.0 03/16/2013   MICRALBCREAT 1.6 11/06/2012   MICRALBCREAT 2.5 07/31/2012   MICRALBCREAT 3.6 11/26/2011   MICRALBCREAT 2.3 11/28/2010  He is not on ACE inhibitor/ARB.  -+ HL: Lab Results  Component Value Date   CHOL 160 09/06/2020   HDL 36 (L) 09/06/2020    LDLCALC 87 09/06/2020   LDLDIRECT 125.0 12/26/2018   TRIG 220 (H) 09/06/2020   CHOLHDL 4.4 09/06/2020  On Lipitor 20.  - Last eye exam was in 11/2021: + DR - improved.  He sees a retina specialist.  - No numbness and tingling in feet - sees podiatry.  Previously on gabapentin, then on Lyrica, then off.  He has OSA and is compliant with his CPAP. He had a DVT in R leg in 04/2015.  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  He noticed that sugars were higher after he started Eliquis. He lost 45 lbs before 2017 - mostly vegan food.  He has a history of ADD. He did see urology before and was given Viagra, but causes for ED were not explored. We checked his testosterone level.  This returned mildly low: Component     Latest Ref Rng & Units 04/27/2019  Testosterone, Serum (Total)     ng/dL 262 (L)  % Free Testosterone     % 1.9  Free Testosterone, S     pg/mL 50 (L)  Sex Hormone Binding Globulin     nmol/L 34.5  After the above results, I referred him to the weight management clinic.  Reviewed B12 levels: Lab Results  Component Value Date   VITAMINB12 >2000 (  H) 09/06/2020   VITAMINB12 237 04/27/2019   VITAMINB12 236 08/19/2017  Previously on B12 1000 mcg daily, then on 5000 mcg daily, but then came off.  After an episode of syncope, we checked him for adrenal insufficiency -normal cosyntropin stimulation test: Component     Latest Ref Rng & Units 11/24/2020 11/24/2020 11/24/2020         2:08 PM  2:39 PM  3:05 PM  Cortisol, Plasma     ug/dL 9.1 21.8 28.4   He had epidural steroid injections (02/2020 and 03/2020) and then cervical decompression surgery (diskectomy, fusion) on 05/25/2020. He experienced vertigo in 09/2020, before a diagnosis of COVID-19.  He then had a syncopal episode (CBG 160).  He had milder episodes afterwards - but not recently.  No orthostasis.  We checked him for adrenal insufficiency and investigation was negative after last  visit.  He developed leg weakness and had several falls.  He had extensive investigation >> negative. He was referred to cardiology in [place] - was given an appt in 1 year!   Review of Systems: + See HPI  + hand tremors/no numbness/no tingling/+ vertigo and dizziness  I reviewed pt's medications, allergies, PMH, social hx, family hx, and changes were documented in the history of present illness. Otherwise, unchanged from my initial visit note.  Past Medical History:  Diagnosis Date   Arm weakness    right    Asthma    B12 deficiency    Back pain    Complication of anesthesia    "as a teenager , as they were waken up, I was shaking all over- they called it convulsions -it has never happen again"   Diabetes insipidus ([place])    Diabetes mellitus without complication ([place])    TYPE II   DVT (deep venous thrombosis) ([name])    right leg last time 2020   Leg edema, right    Male infertility    Neck injury 12/22/2013   Neuropathy    Obesity    Obesity    OSA (obstructive sleep apnea)    CPAP   Past Surgical History:  Procedure Laterality Date   ANTERIOR CERVICAL DECOMP/DISCECTOMY FUSION N/A 05/25/2020   Procedure: ANTERIOR CERVICAL DECOMPRESSION/DISCECTOMY FUSION, INTERBODY PROSTHESIS, PLATE/SCREWS CERVICAL FIVE- CERVICAL SIX;  Surgeon: Newman Pies, MD;  Location: [facility];  Service: Neurosurgery;  Laterality: N/A;   BACK SURGERY     CERVICAL DISC SURGERY     C6/C7 2009   cubital tunnel left arm     2003   ELBOW SURGERY     FIBULA FRACTURE SURGERY     plate & pin removed due to infection 1997   ULNAR NERVE REPAIR     Social History   Socioeconomic History   Marital status: Married    Spouse name: Juliann Pulse   Number of children: 1   Years of education: Not on file   Highest education level: Bachelor's degree (e.g., BA, AB, BS)  Occupational History   Occupation: Customer Service Lead  Tobacco Use   Smoking status: Never   Smokeless tobacco: Never  Vaping Use   Vaping Use:  Never used  Substance and Sexual Activity   Alcohol use: Yes    Comment: 1 - 2 a year   Drug use: No   Sexual activity: Yes  Other Topics Concern   Not on file  Social History Narrative   Occupation:  [name]   Worked 11- 8 am  Now changed job and shift to  9-8 Mon through Thursday    Working  Ft 40 hours mostly   Married _0 Wife s/p bariatric surgery pt   Regular exercise- yes   Pt does have children   Father died suddenly 05-11-2008   Mom passes 2017  Acute rep disease after acute renal failure   Daily caffeine use one a day   2 dogs and cat.       Right Handed             Social Determinants of Health   Financial Resource Strain: Medium Risk (06/05/2021)   Overall Financial Resource Strain (CARDIA)    Difficulty of Paying Living Expenses: Somewhat hard  Food Insecurity: Food Insecurity Present (06/05/2021)   Hunger Vital Sign    Worried About Running Out of Food in the Last Year: Sometimes true    Ran Out of Food in the Last Year: Never true  Transportation Needs: No Transportation Needs (06/05/2021)   PRAPARE - Hydrologist (Medical): No    Lack of Transportation (Non-Medical): No  Physical Activity: Insufficiently Active (06/05/2021)   Exercise Vital Sign    Days of Exercise per Week: 3 days    Minutes of Exercise per Session: 40 min  Stress: No Stress Concern Present (06/05/2021)   [place]    Feeling of Stress : Only a little  Social Connections: Unknown (06/05/2021)   Social Connection and Isolation Panel [NHANES]    Frequency of Communication with Friends and Family: Twice a week    Frequency of Social Gatherings with Friends and Family: Not on file    Attends Religious Services: Never    Marine scientist or Organizations: No    Attends Music therapist: Not on file    Marital Status: Married  Human resources officer Violence: Not on file    Current Outpatient Medications on File Prior to Visit  Medication Sig Dispense Refill   albuterol (PROVENTIL HFA;VENTOLIN HFA) 108 (90 Base) MCG/ACT inhaler Inhale 2 puffs into the lungs every 6 (six) hours as needed. For wheezing 1 Inhaler 2   apixaban (ELIQUIS) 5 MG TABS tablet TAKE 1 TABLET(5 MG) BY MOUTH TWICE DAILY 180 tablet 2   atorvastatin (LIPITOR) 40 MG tablet TAKE 1 TABLET DAILY 90 tablet 2   cholecalciferol (VITAMIN D3) 25 MCG (1000 UNIT) tablet Take 1,000 Units by mouth daily.     Continuous Blood Gluc Transmit (DEXCOM G6 TRANSMITTER) MISC 1 Device by Does not apply route every 3 (three) months. 1 each 3   escitalopram (LEXAPRO) 10 MG tablet Take 1 tablet (10 mg total) by mouth daily. 30 tablet 0   insulin aspart (NOVOLOG) 100 UNIT/ML injection Use 80 units a day in the insulin pump 90 mL 3   Insulin Disposable Pump (V-GO 30) 30 UNIT/24HR KIT USE 1 A DAY 90 kit 3   JARDIANCE 25 MG TABS tablet TAKE 1 TABLET EVERY DAY BEFORE BREAKFAST 90 tablet 2   metFORMIN (GLUCOPHAGE-XR) 500 MG 24 hr tablet TAKE 4 TABLETS DAILY WITH BREAKFAST 360 tablet 3   Semaglutide, 2 MG/DOSE, 8 MG/3ML SOPN Inject 2 mg as directed once a week. 9 mL 0   No current facility-administered medications on file prior to visit.   No Known Allergies Family History  Problem Relation Age of Onset   Heart disease Mother    Other Mother        clotting disorders  Diabetes Mother    High blood pressure Mother    Kidney disease Mother    Depression Mother    Sleep apnea Mother    Obesity Mother    Other Father        sudden death/cervical and lumbar disk disease   Aneurysm Father        In his 15s smoked   Hyperlipidemia Father    High blood pressure Father    Sudden death Father    Alcoholism Father    Hypertension Other    Allergies Other    Deep vein thrombosis Other    Sleep apnea Other    Obesity Other     Objective:   Physical Exam BP 118/72 (BP Location: Left Arm, Patient Position: Sitting,  Cuff Size: Large)   Pulse 80   Ht _0  (1.803 m)   Wt 292 lb (132.5 kg)   SpO2 96%   BMI 40.73 kg/m   Wt Readings from Last 3 Encounters:  12/12/21 292 lb (132.5 kg)  10/31/21 288 lb (130.6 kg)  09/18/21 283 lb (128.4 kg)   Constitutional: overweight, in NAD Eyes: EOMI, no exophthalmos ENT: no thyromegaly, no cervical lymphadenopathy Cardiovascular: RRR, No MRG, + RLE swelling -chronic, after his DVT  Respiratory: CTA B Musculoskeletal: no deformities Skin: no rashes Neurological: + Mild tremors with outstretched hands Diabetic Foot Exam - Simple   Simple Foot Form Diabetic Foot exam was performed with the following findings: Yes 12/12/2021  9:48 AM  Visual Inspection No deformities, no ulcerations, no other skin breakdown bilaterally: Yes Sensation Testing Intact to touch and monofilament testing bilaterally: Yes Pulse Check Posterior Tibialis and Dorsalis pulse intact bilaterally: Yes Comments R knee wrapping - + edema R fot     Assessment:     1. DM2, insulin-dependent, uncontrolled, with complications - DR  2. Obesity class 3 BMI Classification: < 18.5 underweight  18.5-24.9 normal weight  25.0-29.9 overweight  30.0-34.9 class I obesity  35.0-39.9 class II obesity  ? 40.0 class III obesity   3. HL  Plan:     1. Patient with history of uncontrolled diabetes, on oral antidiabetic regimen with metformin, SGLT2 inhibitor and also weekly GLP-1 receptor agonist along with a V-Go patch pump.  His diabetes started to improve after he started to lose weight and working with a weight management clinic.  At last visit, HbA1c was 6.9%, improved.  At that time, the vast majority of his blood sugars were at goal, but overnight, blood sugars were higher, slightly in the upper range of the target interval.  It appears that the cause for the high overnight blood sugars were higher sugars after dinner.  We discussed about bolusing slightly more insulin with this meal.  We  discussed the use 8 clicks before larger meals and most of his dinners. CGM interpretation: -At today's visit, we reviewed his CGM downloads: It appears that 81% of values are in target range (goal >70%), while 19% are higher than 180 (goal <25%), and 0% are lower than 70 (goal <4%).  The calculated average blood sugar is 157.  The projected HbA1c for the next 3 months (GMI) is 7.1%. -Reviewing the CGM trends, sugars fluctuate in the upper target interval with occasional hyperglycemic spikes after lunch especially, but occasionally after dinner.  He tells me that he occasionally forgets to bolus before lunch, if he is busy at work.  He is working on bolusing before every meal.  In this case, we will not  change his insulin regimen but advised him to continue to bolus 8 units for larger meals.  We will try to increase his Ozempic to 2 mg weekly, if available at the pharmacy -He is interested in starting the Dexcom G7-prescription sent to pharmacy - I advised him to: Patient Instructions  Please continue: - Metformin XR 2000 mg at dinnertime - Jardiance 25 mg before b'fast  - [id number] 4-6 clicks per meal + use 8 clicks for larger meals  Please increase: - Ozempic 2 mg weekly  Please return in 4 months.  - we checked his HbA1c: 7.4% (higher) - advised to check sugars at different times of the day - 4x a day, rotating check times - advised for yearly eye exams >> he is UTD - return to clinic in 4 months  2. Obesity class 3 -continue SGLT 2 inhibitor and GLP-1 receptor agonist which should also help with weight loss -he is seen in the weight management clinic -[place] 4 lbs before last OV but gained 9 pounds since then  3. HL -Reviewed his latest lipid panel from 08/2020: LDL and triglycerides above target, HDL low: Lab Results  Component Value Date   CHOL 160 09/06/2020   HDL 36 (L) 09/06/2020   LDLCALC 87 09/06/2020   LDLDIRECT 125.0 12/26/2018   TRIG 220 (H) 09/06/2020   CHOLHDL 4.4  09/06/2020  -He continues on Lipitor 40 mg daily without side effects  Philemon Kingdom, MD PhD [facility]

## 2021-12-12 NOTE — Addendum Note (Signed)
Addended by: Jefferson Fuel on: 12/12/2021 10:12 AM   Modules accepted: Orders

## 2021-12-13 ENCOUNTER — Encounter: Payer: Self-pay | Admitting: Internal Medicine

## 2021-12-13 DIAGNOSIS — Z794 Long term (current) use of insulin: Secondary | ICD-10-CM

## 2021-12-15 MED ORDER — SEMAGLUTIDE (2 MG/DOSE) 8 MG/3ML ~~LOC~~ SOPN: 2.0000 mg | PEN_INJECTOR | SUBCUTANEOUS | 0 refills | Status: DC

## 2021-12-18 ENCOUNTER — Other Ambulatory Visit (HOSPITAL_COMMUNITY): Payer: Self-pay

## 2021-12-19 ENCOUNTER — Encounter (INDEPENDENT_AMBULATORY_CARE_PROVIDER_SITE_OTHER): Payer: Self-pay | Admitting: Family Medicine

## 2021-12-19 ENCOUNTER — Ambulatory Visit (INDEPENDENT_AMBULATORY_CARE_PROVIDER_SITE_OTHER): Payer: BC Managed Care – PPO | Admitting: Family Medicine

## 2021-12-19 VITALS — BP 102/66 | HR 98 | Temp 98.2°F | Ht 71.0 in | Wt 287.0 lb

## 2021-12-19 DIAGNOSIS — E669 Obesity, unspecified: Secondary | ICD-10-CM

## 2021-12-19 DIAGNOSIS — Z7984 Long term (current) use of oral hypoglycemic drugs: Secondary | ICD-10-CM

## 2021-12-19 DIAGNOSIS — F3289 Other specified depressive episodes: Secondary | ICD-10-CM

## 2021-12-19 DIAGNOSIS — E118 Type 2 diabetes mellitus with unspecified complications: Secondary | ICD-10-CM

## 2021-12-19 DIAGNOSIS — Z6841 Body Mass Index (BMI) 40.0 and over, adult: Secondary | ICD-10-CM

## 2021-12-19 DIAGNOSIS — Z7985 Long-term (current) use of injectable non-insulin antidiabetic drugs: Secondary | ICD-10-CM

## 2021-12-19 DIAGNOSIS — Z794 Long term (current) use of insulin: Secondary | ICD-10-CM

## 2021-12-19 MED ORDER — ESCITALOPRAM OXALATE 10 MG PO TABS: 10.0000 mg | ORAL_TABLET | Freq: Every day | ORAL | 0 refills | Status: DC

## 2021-12-20 ENCOUNTER — Encounter (INDEPENDENT_AMBULATORY_CARE_PROVIDER_SITE_OTHER): Payer: Self-pay | Admitting: Family Medicine

## 2021-12-26 NOTE — Progress Notes (Signed)
Chief Complaint:   OBESITY Troy [name] is here to discuss his progress with his obesity treatment plan along with follow-up of his obesity related diagnoses. Troy [name] is on the Category 3 Plan and states he is following his eating plan approximately 90% of the time. Troy [name] states he is walking for 15-20 minutes 2-3 times per week.  Today's visit was #: 26 Starting weight: 293 lbs Starting date: 09/07/2019 Today's weight: 287 lbs Today's date: 12/19/2021 Total lbs lost to date: 6 Total lbs lost since last in-office visit: 1  Interim History: Troy [name] has maintained his weight loss following his recent illness.  He is still feeling a little fatigued.  He is drinking 150-200 ounces of fluid daily.  He is going to celebrate his anniversary tonight.  Subjective:   1. Type 2 diabetes mellitus with complication, with long-term current use of insulin ([name]) Troy [name]'s fasting blood sugars range between 108-110.  He is taking insulin, metformin, Jardiance, and Ozempic.  He is unable to get Ozempic 2 mg, as yet due to availability.  He is seeing endocrinology regularly.  He continues to work on his weight loss, diet, and exercise.  He took his last Ozempic 1 mg dose on Saturday.  He denies side effects.  2. Other depression, with emotional eating behaviors Troy [name] is taking Lexapro with no side effects noted.  He notes it is helping to control his irritability.  Assessment/Plan:   1. Type 2 diabetes mellitus with complication, with long-term current use of insulin ([name]) Troy [name] will continue his medications and will let us know if he has any issues with getting increased dose of Ozempic.  He will continue with his diet and exercise.  2. Other depression, with emotional eating behaviors Troy [name] will continue Lexapro 10 mg once daily, and we will refill for 1 month.   - escitalopram (LEXAPRO) 10 MG tablet; Take 1 tablet (10 mg total) by mouth daily.  Dispense: 30 tablet; Refill: 0  3. Obesity,  Current BMI 40.0 Troy [name] is currently in the action stage of change. As such, his goal is to continue with weight loss efforts. He has agreed to the Category 3 Plan.   Halloween strategies were discussed, and handout was provided.  Exercise goals: As is.   Behavioral modification strategies: increasing lean protein intake, decreasing simple carbohydrates, increasing water intake, emotional eating strategies, and celebration eating strategies.  Troy [name] has agreed to follow-up with our clinic in 3 to 4 weeks. He was informed of the importance of frequent follow-up visits to maximize his success with intensive lifestyle modifications for his multiple health conditions.   Objective:   Blood pressure 102/66, pulse 98, temperature 98.2 F (36.8 C), height '5\' 11"'$  (1.803 m), weight 287 lb (130.2 kg), SpO2 96 %. Body mass index is 40.03 kg/m.  General: Cooperative, alert, well developed, in no acute distress. HEENT: Conjunctivae and lids unremarkable. Cardiovascular: Regular rhythm.  Lungs: Normal work of breathing. Neurologic: No focal deficits.   Lab Results  Component Value Date   CREATININE 0.76 03/23/2021   BUN 17 03/23/2021   NA 141 03/23/2021   K 4.4 03/23/2021   CL 105 03/23/2021   CO2 24 03/23/2021   Lab Results  Component Value Date   ALT 23 03/23/2021   AST 15 03/23/2021   ALKPHOS 70 03/23/2021   BILITOT 0.7 03/23/2021   Lab Results  Component Value Date   HGBA1C 7.4 (A) 12/12/2021   HGBA1C 6.9 (A) 08/08/2021   HGBA1C 7.9 (H) 03/23/2021  HGBA1C 6.7 (A) 11/18/2020   HGBA1C 8.2 (H) 09/06/2020   Lab Results  Component Value Date   INSULIN 7.9 09/06/2020   Lab Results  Component Value Date   TSH 1.06 02/06/2021   Lab Results  Component Value Date   CHOL 160 09/06/2020   HDL 36 (L) 09/06/2020   LDLCALC 87 09/06/2020   LDLDIRECT 125.0 12/26/2018   TRIG 220 (H) 09/06/2020   CHOLHDL 4.4 09/06/2020   Lab Results  Component Value Date   VD25OH 70.2  03/23/2021   VD25OH 55.3 09/06/2020   VD25OH 63.0 06/13/2020   Lab Results  Component Value Date   WBC 9.6 10/31/2020   HGB 16.3 10/31/2020   HCT 47.1 10/31/2020   MCV 94.4 10/31/2020   PLT 248 10/31/2020   Lab Results  Component Value Date   IRON 144 02/08/2020   TIBC 358 02/08/2020   FERRITIN 96 02/08/2020   Attestation Statements:   Reviewed by clinician on day of visit: allergies, medications, problem list, medical history, surgical history, family history, social history, and previous encounter notes.   I, Trixie Dredge, am acting as transcriptionist for Dennard Nip, MD.  I have reviewed the above documentation for accuracy and completeness, and I agree with the above. -  Dennard Nip, MD

## 2022-01-02 ENCOUNTER — Other Ambulatory Visit (INDEPENDENT_AMBULATORY_CARE_PROVIDER_SITE_OTHER): Payer: Self-pay | Admitting: Family Medicine

## 2022-01-02 DIAGNOSIS — F3289 Other specified depressive episodes: Secondary | ICD-10-CM

## 2022-01-10 ENCOUNTER — Encounter: Payer: Self-pay | Admitting: Internal Medicine

## 2022-01-15 ENCOUNTER — Encounter (INDEPENDENT_AMBULATORY_CARE_PROVIDER_SITE_OTHER): Payer: Self-pay | Admitting: Family Medicine

## 2022-01-15 ENCOUNTER — Ambulatory Visit (INDEPENDENT_AMBULATORY_CARE_PROVIDER_SITE_OTHER): Payer: BC Managed Care – PPO | Admitting: Family Medicine

## 2022-01-15 VITALS — BP 111/58 | HR 72 | Temp 97.8°F | Ht 71.0 in | Wt 295.0 lb

## 2022-01-15 DIAGNOSIS — M25562 Pain in left knee: Secondary | ICD-10-CM

## 2022-01-15 DIAGNOSIS — E118 Type 2 diabetes mellitus with unspecified complications: Secondary | ICD-10-CM | POA: Diagnosis not present

## 2022-01-15 DIAGNOSIS — Z7984 Long term (current) use of oral hypoglycemic drugs: Secondary | ICD-10-CM

## 2022-01-15 DIAGNOSIS — F3289 Other specified depressive episodes: Secondary | ICD-10-CM | POA: Diagnosis not present

## 2022-01-15 DIAGNOSIS — Z794 Long term (current) use of insulin: Secondary | ICD-10-CM

## 2022-01-15 DIAGNOSIS — E669 Obesity, unspecified: Secondary | ICD-10-CM

## 2022-01-15 DIAGNOSIS — Z6841 Body Mass Index (BMI) 40.0 and over, adult: Secondary | ICD-10-CM

## 2022-01-15 DIAGNOSIS — A1801 Tuberculosis of spine: Secondary | ICD-10-CM

## 2022-01-15 MED ORDER — TIRZEPATIDE 12.5 MG/0.5ML ~~LOC~~ SOAJ: 12.5000 mg | SUBCUTANEOUS | 0 refills | Status: DC

## 2022-01-15 MED ORDER — ESCITALOPRAM OXALATE 10 MG PO TABS: 10.0000 mg | ORAL_TABLET | Freq: Every day | ORAL | 0 refills | Status: DC

## 2022-01-23 NOTE — Progress Notes (Signed)
Chief Complaint:   OBESITY Troy [name] is here to discuss his progress with his obesity treatment plan along with follow-up of his obesity related diagnoses. [name] is on the Category 3 Plan and states he is following his eating plan approximately 95% of the time. [name] states he is walking for 15 minutes 3 times per week.  Today's visit was #: 30 Starting weight: 293 lbs Starting date: 09/07/2019 Today's weight: 295 lbs Today's date: 01/15/2022 Total lbs lost to date: 0 Total lbs lost since last in-office visit: 0  Interim History: Troy [name] did some celebration eating over the weekend.  He went to [name] [address] and walked a great deal.  He is having more problems with his left knee.  Subjective:   1. Type 2 diabetes mellitus with complication, with long-term current use of insulin ([name]) Kline is taking metformin and using an insulin pump.  He denies side effects.  He was unable to obtain Ozempic for the past 4 weeks due to unavailability.  His fasting CBGs are 180 today.  2. Left knee pain, unspecified chronicity [name] has a new diagnosis. He notes pain and popping, and he feels his knee will give way on him at times.  3. Pott's disease [name] notes increased lethargy lately.  He notes some shortness of breath with fatigue after increased activity.  He likes working in the yard more than previously.  4. Other depression, with emotional eating behaviors Chaden reports that he feels he may be more fatigued lately and he was concerned it was related to Lexapro, but he feels may be this started before the Lexapro was started.   Assessment/Plan:   1. Type 2 diabetes mellitus with complication, with long-term current use of insulin ([name]) Troy [name] agreed to change to another GLP-1.  He agreed to start Mounjaro 12.5 mg once weekly with no refills.  - tirzepatide (MOUNJARO) 12.5 MG/0.5ML Pen; Inject 12.5 mg into the skin once a week.  Dispense: 2 mL; Refill: 0  2. Left knee pain,  unspecified chronicity Troy [name] was referred to Dr. [name] of sports medicine for evaluation.  - Ambulatory referral to Sports Medicine  3. Pott's disease Troy [name] was advised to follow-up with his PCP for further evaluation.  He does have an appointment scheduled for 01/31/2022, but he was advised to call today.  4. Other depression, with emotional eating behaviors Troy [name] will continue with his Lexapro 10 mg once daily, and we will refill for 1 month.  - escitalopram (LEXAPRO) 10 MG tablet; Take 1 tablet (10 mg total) by mouth daily.  Dispense: 30 tablet; Refill: 0  5. Obesity, Current BMI 41.2 Troy [name] is currently in the action stage of change. As such, his goal is to continue with weight loss efforts. He has agreed to the Category 3 Plan.   Exercise goals: As is.   Behavioral modification strategies: increasing lean protein intake, decreasing simple carbohydrates, meal planning and cooking strategies, emotional eating strategies, and planning for success.  [name] has agreed to follow-up with our clinic in 3 to 4 weeks. He was informed of the importance of frequent follow-up visits to maximize his success with intensive lifestyle modifications for his multiple health conditions.   Objective:   Blood pressure (!) 111/58, pulse 72, temperature 97.8 F (36.6 C), height '5\' 11"'$  (1.803 m), weight 295 lb (133.8 kg), SpO2 97 %. Body mass index is 41.14 kg/m.  General: Cooperative, alert, well developed, in no acute distress. HEENT: Conjunctivae and lids unremarkable. Cardiovascular: Regular rhythm.  Lungs: Normal work of breathing. Neurologic: No focal deficits.   Lab Results  Component Value Date   CREATININE 0.76 03/23/2021   BUN 17 03/23/2021   NA 141 03/23/2021   K 4.4 03/23/2021   CL 105 03/23/2021   CO2 24 03/23/2021   Lab Results  Component Value Date   ALT 23 03/23/2021   AST 15 03/23/2021   ALKPHOS 70 03/23/2021   BILITOT 0.7 03/23/2021   Lab Results   Component Value Date   HGBA1C 7.4 (A) 12/12/2021   HGBA1C 6.9 (A) 08/08/2021   HGBA1C 7.9 (H) 03/23/2021   HGBA1C 6.7 (A) 11/18/2020   HGBA1C 8.2 (H) 09/06/2020   Lab Results  Component Value Date   INSULIN 7.9 09/06/2020   Lab Results  Component Value Date   TSH 1.06 02/06/2021   Lab Results  Component Value Date   CHOL 160 09/06/2020   HDL 36 (L) 09/06/2020   LDLCALC 87 09/06/2020   LDLDIRECT 125.0 12/26/2018   TRIG 220 (H) 09/06/2020   CHOLHDL 4.4 09/06/2020   Lab Results  Component Value Date   VD25OH 70.2 03/23/2021   VD25OH 55.3 09/06/2020   VD25OH 63.0 06/13/2020   Lab Results  Component Value Date   WBC 9.6 10/31/2020   HGB 16.3 10/31/2020   HCT 47.1 10/31/2020   MCV 94.4 10/31/2020   PLT 248 10/31/2020   Lab Results  Component Value Date   IRON 144 02/08/2020   TIBC 358 02/08/2020   FERRITIN 96 02/08/2020   Attestation Statements:   Reviewed by clinician on day of visit: allergies, medications, problem list, medical history, surgical history, family history, social history, and previous encounter notes.  I have personally spent 44 minutes total time today in preparation, patient care, and documentation for this visit, including the following: review of clinical lab tests; review of medical tests/procedures/services.  I, Trixie Dredge, am acting as transcriptionist for Dennard Nip, MD.  I have reviewed the above documentation for accuracy and completeness, and I agree with the above. -  Dennard Nip, MD

## 2022-01-30 NOTE — Progress Notes (Signed)
Chief Complaint  Patient presents with   Annual Exam    Pt brought [place] form with [name] to fill out by provider and states he had it filled out before. Also want to discuss to Dr. Regis Bill if he is qualify for disability placard.     HPI: Patient  Troy [name]  51 y.o. comes in today for Preventive Health Care visit  and fu med conditions  Has accomodation form to be completed to continue  for health conditions.  Dm mounjaro   just changed had gained wieght when off semiglutied from  manufacturer shortage. Weight had gotten down to  265 and now 293 + also on jardiacne and metformin  and insulin  Chornic cvt  the same compression stockings  right le  On lexapro and has helped with  moodiness and irritability  ? Shoingrix vaccine  Seems more sob  not sure if related to weight gain or other conditions. No syncope cp  tachy   sleep apnea  Had carde val for near syncoep and orthostatic dizziness  neg card event monitor and  echo nafindings Anticoagulation  no bleeding  no new edema  Saw Dr ALva 2022  [facility]   Health Maintenance  Topic Date Due   Hepatitis C Screening  Never done   Zoster Vaccines- Shingrix (1 of 2) Never done   COVID-19 Vaccine (4 - 2023-24 season) 11/10/2021   HEMOGLOBIN A1C  06/13/2022   OPHTHALMOLOGY EXAM  11/01/2022   FOOT EXAM  12/13/2022   Diabetic kidney evaluation - GFR measurement  02/01/2023   Diabetic kidney evaluation - Urine ACR  02/01/2023   COLONOSCOPY (Pts 45-75yr Insurance coverage will need to be confirmed)  04/15/2023   INFLUENZA VACCINE  Completed   HIV Screening  Completed   HPV VACCINES  Aged Out   Health Maintenance Review LIFESTYLE:  Exercise:    trying to walk   had popping left knee   to dr   cGeorgina Snell  Tobacco/ETS:n Alcohol:   n Sugar beverages: n Sleep: 6-7   osa   on cpap  Drug use: no HH of 3 2 c 2 d  Work: ft      ROS:  REST of 12 system review negative except as per HPI   Past Medical History:  Diagnosis  Date   Arm weakness    right    Asthma    B12 deficiency    Back pain    Complication of anesthesia    "as a teenager , as they were waken up, I was shaking all over- they called it convulsions -it has never happen again"   Diabetes insipidus ([facility]    Diabetes mellitus without complication ([facility]    TYPE II   DVT (deep venous thrombosis) ([facility]    right leg last time 2020   Leg edema, right    Male infertility    Neck injury 12/22/2013   Neuropathy    Obesity    Obesity    OSA (obstructive sleep apnea)    CPAP    Past Surgical History:  Procedure Laterality Date   ANTERIOR CERVICAL DECOMP/DISCECTOMY FUSION N/A 05/25/2020   Procedure: ANTERIOR CERVICAL DECOMPRESSION/DISCECTOMY FUSION, INTERBODY PROSTHESIS, PLATE/SCREWS CERVICAL FIVE- CERVICAL SIX;  Surgeon: JNewman Pies MD;  [facility]  Service: Neurosurgery;  Laterality: N/A;   BACK SURGERY     CERVICAL DISC SURGERY     C6/C7 2009   cubital tunnel left arm     2003  ELBOW SURGERY     FIBULA FRACTURE SURGERY     plate & pin removed due to infection [place]      Family History  Problem Relation Age of Onset   Heart disease Mother    Other Mother        clotting disorders   Diabetes Mother    High blood pressure Mother    Kidney disease Mother    Depression Mother    Sleep apnea Mother    Obesity Mother    Other Father        sudden death/cervical and lumbar disk disease   Aneurysm Father        In his 23s smoked   Hyperlipidemia Father    High blood pressure Father    Sudden death Father    Alcoholism Father    Hypertension Other    Allergies Other    Deep vein thrombosis Other    Sleep apnea Other    Obesity Other     Social History   Socioeconomic History   Marital status: Married    Spouse name: Juliann Pulse   Number of children: 1   Years of education: Not on file   Highest education level: Bachelor's degree (e.g., BA, AB, BS)  Occupational History   Occupation: Customer  Service Lead  Tobacco Use   Smoking status: Never   Smokeless tobacco: Never  Vaping Use   Vaping Use: Never used  Substance and Sexual Activity   Alcohol use: Yes    Comment: 1 - 2 a year   Drug use: No   Sexual activity: Yes  Other Topics Concern   Not on file  Social History Narrative   Occupation:  days [name]   Worked 11- 8 am  Now changed job and shift to [employer] through Thursday    Working  Ft 40 hours mostly   Married _0 Wife s/p bariatric surgery pt   Regular exercise- yes   Pt does have children   Father died suddenly 05-24-08   Mom passes 2017  Acute rep disease after acute renal failure   Daily caffeine use one a day   2 dogs and cat.       Right Handed             Social Determinants of Health   Financial Resource Strain: Medium Risk (06/05/2021)   Overall Financial Resource Strain (CARDIA)    Difficulty of Paying Living Expenses: Somewhat hard  Food Insecurity: Food Insecurity Present (06/05/2021)   Hunger Vital Sign    Worried About Running Out of Food in the Last Year: Sometimes true    Ran Out of Food in the Last Year: Never true  Transportation Needs: No Transportation Needs (06/05/2021)   PRAPARE - Hydrologist (Medical): No    Lack of Transportation (Non-Medical): No  Physical Activity: Insufficiently Active (06/05/2021)   Exercise Vital Sign    Days of Exercise per Week: 3 days    Minutes of Exercise per Session: 40 min  Stress: No Stress Concern Present (06/05/2021)   [place]    Feeling of Stress : Only a little  Social Connections: Unknown (06/05/2021)   Social Connection and Isolation Panel [NHANES]    Frequency of Communication with Friends and Family: Twice a week    Frequency of Social Gatherings with Friends and Family: Not on file  Attends Religious Services: Never    Active Member of Clubs or Organizations: No    Attends Programme researcher, broadcasting/film/video: Not on file    Marital Status: Married    Outpatient Medications Prior to Visit  Medication Sig Dispense Refill   albuterol (PROVENTIL HFA;VENTOLIN HFA) 108 (90 Base) MCG/ACT inhaler Inhale 2 puffs into the lungs every 6 (six) hours as needed. For wheezing 1 Inhaler 2   apixaban (ELIQUIS) 5 MG TABS tablet TAKE 1 TABLET(5 MG) BY MOUTH TWICE DAILY 180 tablet 2   atorvastatin (LIPITOR) 40 MG tablet TAKE 1 TABLET DAILY 90 tablet 2   cholecalciferol (VITAMIN D3) 25 MCG (1000 UNIT) tablet Take 1,000 Units by mouth daily.     Continuous Blood Gluc Sensor (DEXCOM G7 SENSOR) MISC 3 each by Does not apply route every 30 (thirty) days. Apply 1 sensor every 10 days 9 each 4   escitalopram (LEXAPRO) 10 MG tablet Take 1 tablet (10 mg total) by mouth daily. 30 tablet 0   insulin aspart (NOVOLOG) 100 UNIT/ML injection Use 80 units a day in the insulin pump 90 mL 3   Insulin Disposable Pump (V-GO 30) 30 UNIT/24HR KIT Inject 1 each into the skin daily. 90 kit 3   JARDIANCE 25 MG TABS tablet TAKE 1 TABLET EVERY DAY BEFORE BREAKFAST 90 tablet 2   metFORMIN (GLUCOPHAGE-XR) 500 MG 24 hr tablet TAKE 4 TABLETS DAILY WITH BREAKFAST 360 tablet 3   tirzepatide (MOUNJARO) 12.5 MG/0.5ML Pen Inject 12.5 mg into the skin once a week. 2 mL 0   No facility-administered medications prior to visit.     EXAM:  BP 114/70 (BP Location: Right Arm, Patient Position: Sitting, Cuff Size: Large)   Pulse 83   Temp 97.7 F (36.5 C) (Oral)   Ht 5' 10.5" (1.791 m)   Wt 297 lb 12.8 oz (135.1 kg)   SpO2 98%   BMI 42.13 kg/m   Body mass index is 42.13 kg/m. Wt Readings from Last 3 Encounters:  01/31/22 297 lb 12.8 oz (135.1 kg)  01/15/22 295 lb (133.8 kg)  12/19/21 287 lb (130.2 kg)    Physical Exam: Vital signs reviewed QMG:NOIB is a well-developed well-nourished alert cooperative    who appearsr stated age in no acute distress.  HEENT: normocephalic atraumatic , Eyes: PERRL EOM's full,  conjunctiva clear, Nares: paten,t no deformity discharge or tenderness., Ears: no deformity EAC's clear TMs with normal landmarks. Mouth: clear OP, no lesions, edema.  Moist mucous membranes. Dentition in adequate repair. NECK:without masses, thyromegaly or bruits. CHEST/PULM:  Clear to auscultation and percussion breath sounds equal no wheeze , rales or rhonchi. No chest wall deformities or tenderness. CV: PMI is nondisplaced, S1 S2 no gallops, murmurs, rubs. Peripheral pulses are full without delay.No JVD .  ABDOMEN: Bowel sounds normal nontender  No guard or rebound, no hepato splenomegal no CVA tenderness.  No hernia. Extremtities:  No clubbing cyanosis or edema, no acute joint swelling or redness rue atrophy no change  RLE no sig edema after wrap removed  NEURO:  Oriented x3, cranial nerves 3-12 appear to be intact, no obvious focal weakness,gait within normal limits no abnormal reflexes or asymmetrical  weak rle  SKIN: No acute rashes normal turgor, color, no bruising or petechiae. PSYCH: Oriented, good eye contact, no obvious depression anxiety, cognition and judgment appear normal. LN: no cervical axillary adenopathy    Lab Results  Component Value Date   WBC 8.6 01/31/2022   HGB 16.7 01/31/2022  HCT 49.1 01/31/2022   PLT 239.0 01/31/2022   GLUCOSE 163 (H) 01/31/2022   CHOL 148 01/31/2022   TRIG 191.0 (H) 01/31/2022   HDL 39.20 01/31/2022   LDLDIRECT 125.0 12/26/2018   LDLCALC 71 01/31/2022   ALT 23 01/31/2022   AST 15 01/31/2022   NA 137 01/31/2022   K 4.4 01/31/2022   CL 101 01/31/2022   CREATININE 0.82 01/31/2022   BUN 14 01/31/2022   CO2 27 01/31/2022   TSH 0.82 01/31/2022   HGBA1C 7.4 (A) 12/12/2021   MICROALBUR <0.7 01/31/2022    BP Readings from Last 3 Encounters:  01/31/22 114/70  01/15/22 (!) 111/58  12/19/21 102/66    Lab rplan labs x A1c  reviewed with patient   ASSESSMENT AND PLAN:  Discussed the following assessment and plan:    ICD-10-CM   1.  Visit for preventive health examination  E99.37 Basic metabolic panel    CBC with Differential/Platelet    Hepatic function panel    Lipid panel    TSH    Microalbumin / creatinine urine ratio    Basic metabolic panel    CBC with Differential/Platelet    Hepatic function panel    Lipid panel    TSH    Microalbumin / creatinine urine ratio    2. Medication management  J69.678 Basic metabolic panel    CBC with Differential/Platelet    Hepatic function panel    Lipid panel    TSH    Microalbumin / creatinine urine ratio    Brain Natriuretic Peptide    Basic metabolic panel    CBC with Differential/Platelet    Hepatic function panel    Lipid panel    TSH    Microalbumin / creatinine urine ratio    Brain Natriuretic Peptide    3. Mixed hyperlipidemia  L38.1 Basic metabolic panel    CBC with Differential/Platelet    Hepatic function panel    Lipid panel    TSH    Microalbumin / creatinine urine ratio    Basic metabolic panel    CBC with Differential/Platelet    Hepatic function panel    Lipid panel    TSH    Microalbumin / creatinine urine ratio    4. Chronic deep vein thrombosis (DVT) of right lower extremity, unspecified vein ([name])  O17.510 Basic metabolic panel    CBC with Differential/Platelet    Hepatic function panel    Lipid panel    TSH    Microalbumin / creatinine urine ratio    Basic metabolic panel    CBC with Differential/Platelet    Hepatic function panel    Lipid panel    TSH    Microalbumin / creatinine urine ratio    5. Cervical disc disorder  C58.52 Basic metabolic panel    CBC with Differential/Platelet    Hepatic function panel    Lipid panel    TSH    Microalbumin / creatinine urine ratio    Basic metabolic panel    CBC with Differential/Platelet    Hepatic function panel    Lipid panel    TSH    Microalbumin / creatinine urine ratio    6. Chronic anticoagulation  D78.24 Basic metabolic panel    CBC with Differential/Platelet     Hepatic function panel    Lipid panel    TSH    Microalbumin / creatinine urine ratio    Brain Natriuretic Peptide    Basic metabolic panel  CBC with Differential/Platelet    Hepatic function panel    Lipid panel    TSH    Microalbumin / creatinine urine ratio    Brain Natriuretic Peptide    7. Morbid obesity ([name])  M60.04 Basic metabolic panel    CBC with Differential/Platelet    Hepatic function panel    Lipid panel    TSH    Microalbumin / creatinine urine ratio    Brain Natriuretic Peptide    Basic metabolic panel    CBC with Differential/Platelet    Hepatic function panel    Lipid panel    TSH    Microalbumin / creatinine urine ratio    Brain Natriuretic Peptide    8. Shortness of breath  R06.02 Brain Natriuretic Peptide    Brain Natriuretic Peptide    9. OSA (obstructive sleep apnea)  G47.33     Record review    has seen dr Elsworth Soho  2022  Dr. Tamala Julian 2022  Will support continuing   accomodation   .   To do form  May need plan to see  pulmonary team.  Return for depending on results and plan s.  Patient Care Team: Jaire Pinkham, [name], MD as PCP - General (Internal Medicine) Philemon Kingdom, MD as Consulting Physician (Internal Medicine) Alda Berthold, DO as Consulting Physician (Neurology) Patient Instructions  Lab today  Will do record review and consider seeing  pulmonary for  shortness of breath but   the increase weight may  make a difference.   Fmla form  TBD.  Hopefully the mounjaro will help get the weight back down.   Singles vaccine at pharmacy .  When convenient   [employer]. Lowen Barringer M.D.

## 2022-01-31 ENCOUNTER — Encounter: Payer: Self-pay | Admitting: Internal Medicine

## 2022-01-31 ENCOUNTER — Ambulatory Visit (INDEPENDENT_AMBULATORY_CARE_PROVIDER_SITE_OTHER): Payer: BC Managed Care – PPO | Admitting: Internal Medicine

## 2022-01-31 VITALS — BP 114/70 | HR 83 | Temp 97.7°F | Ht 70.5 in | Wt 297.8 lb

## 2022-01-31 DIAGNOSIS — M5090 Cervical disc disorder, unspecified, unspecified cervical region: Secondary | ICD-10-CM | POA: Diagnosis not present

## 2022-01-31 DIAGNOSIS — E782 Mixed hyperlipidemia: Secondary | ICD-10-CM

## 2022-01-31 DIAGNOSIS — R0602 Shortness of breath: Secondary | ICD-10-CM

## 2022-01-31 DIAGNOSIS — Z7901 Long term (current) use of anticoagulants: Secondary | ICD-10-CM

## 2022-01-31 DIAGNOSIS — Z0000 Encounter for general adult medical examination without abnormal findings: Secondary | ICD-10-CM

## 2022-01-31 DIAGNOSIS — Z79899 Other long term (current) drug therapy: Secondary | ICD-10-CM

## 2022-01-31 DIAGNOSIS — I82501 Chronic embolism and thrombosis of unspecified deep veins of right lower extremity: Secondary | ICD-10-CM | POA: Diagnosis not present

## 2022-01-31 DIAGNOSIS — G4733 Obstructive sleep apnea (adult) (pediatric): Secondary | ICD-10-CM

## 2022-01-31 LAB — MICROALBUMIN / CREATININE URINE RATIO
Creatinine,U: 69.6 mg/dL
Microalb Creat Ratio: 1 mg/g (ref 0.0–30.0)
Microalb, Ur: <0.7 mg/dL (ref 0.0–1.9)

## 2022-01-31 LAB — TSH: TSH: 0.82 u[IU]/mL (ref 0.35–5.50)

## 2022-01-31 LAB — CBC WITH DIFFERENTIAL/PLATELET
Basophils Absolute: 0 10*3/uL (ref 0.0–0.1)
Basophils Relative: 0.4 % (ref 0.0–3.0)
Eosinophils Absolute: 0 10*3/uL (ref 0.0–0.7)
Eosinophils Relative: 0.5 % (ref 0.0–5.0)
HCT: 49.1 % (ref 39.0–52.0)
Hemoglobin: 16.7 g/dL (ref 13.0–17.0)
Lymphocytes Relative: 28.7 % (ref 12.0–46.0)
Lymphs Abs: 2.5 10*3/uL (ref 0.7–4.0)
MCHC: 34 g/dL (ref 30.0–36.0)
MCV: 97.5 fl (ref 78.0–100.0)
Monocytes Absolute: 0.5 10*3/uL (ref 0.1–1.0)
Monocytes Relative: 6.2 % (ref 3.0–12.0)
Neutro Abs: 5.5 10*3/uL (ref 1.4–7.7)
Neutrophils Relative %: 64.2 % (ref 43.0–77.0)
Platelets: 239 10*3/uL (ref 150.0–400.0)
RBC: 5.03 Mil/uL (ref 4.22–5.81)
RDW: 12.9 % (ref 11.5–15.5)
WBC: 8.6 10*3/uL (ref 4.0–10.5)

## 2022-01-31 LAB — BASIC METABOLIC PANEL
BUN: 14 mg/dL (ref 6–23)
CO2: 27 mEq/L (ref 19–32)
Calcium: 9 mg/dL (ref 8.4–10.5)
Chloride: 101 mEq/L (ref 96–112)
Creatinine, Ser: 0.82 mg/dL (ref 0.40–1.50)
GFR: 101.73 mL/min (ref >60.00)
Glucose, Bld: 163 mg/dL — ABNORMAL HIGH (ref 70–99)
Potassium: 4.4 mEq/L (ref 3.5–5.1)
Sodium: 137 mEq/L (ref 135–145)

## 2022-01-31 LAB — HEPATIC FUNCTION PANEL
ALT: 23 U/L (ref 0–53)
AST: 15 U/L (ref 0–37)
Albumin: 4.4 g/dL (ref 3.5–5.2)
Alkaline Phosphatase: 59 U/L (ref 39–117)
Bilirubin, Direct: 0.2 mg/dL (ref 0.0–0.3)
Total Bilirubin: 0.8 mg/dL (ref 0.2–1.2)
Total Protein: 6.9 g/dL (ref 6.0–8.3)

## 2022-01-31 LAB — LIPID PANEL
Cholesterol: 148 mg/dL (ref 0–200)
HDL: 39.2 mg/dL (ref >39.00)
LDL Cholesterol: 71 mg/dL (ref 0–99)
NonHDL: 108.84
Total CHOL/HDL Ratio: 4
Triglycerides: 191 mg/dL — ABNORMAL HIGH (ref 0.0–149.0)
VLDL: 38.2 mg/dL (ref 0.0–40.0)

## 2022-01-31 LAB — BRAIN NATRIURETIC PEPTIDE: Pro B Natriuretic peptide (BNP): 11 pg/mL (ref 0.0–100.0)

## 2022-01-31 NOTE — Patient Instructions (Signed)
Lab today  Will do record review and consider seeing  pulmonary for  shortness of breath but   the increase weight may  make a difference.   Fmla form  TBD.  Hopefully the mounjaro will help get the weight back down.   Singles vaccine at pharmacy .  When convenient

## 2022-02-03 ENCOUNTER — Encounter: Payer: Self-pay | Admitting: Internal Medicine

## 2022-02-03 NOTE — Progress Notes (Signed)
Results normal or stable x cbg is 163   suggest see pulmonary team if shortness of breath continues   could be from  the weight gain

## 2022-02-06 DIAGNOSIS — R6883 Chills (without fever): Secondary | ICD-10-CM | POA: Diagnosis not present

## 2022-02-06 DIAGNOSIS — Z1152 Encounter for screening for COVID-19: Secondary | ICD-10-CM | POA: Diagnosis not present

## 2022-02-12 ENCOUNTER — Ambulatory Visit: Payer: Self-pay

## 2022-02-12 ENCOUNTER — Ambulatory Visit (INDEPENDENT_AMBULATORY_CARE_PROVIDER_SITE_OTHER): Payer: BC Managed Care – PPO | Admitting: Family Medicine

## 2022-02-12 ENCOUNTER — Ambulatory Visit (INDEPENDENT_AMBULATORY_CARE_PROVIDER_SITE_OTHER): Payer: BC Managed Care – PPO

## 2022-02-12 ENCOUNTER — Encounter (INDEPENDENT_AMBULATORY_CARE_PROVIDER_SITE_OTHER): Payer: Self-pay | Admitting: Family Medicine

## 2022-02-12 VITALS — BP 106/64 | HR 85 | Temp 97.7°F | Ht 71.0 in | Wt 285.0 lb

## 2022-02-12 VITALS — BP 126/78 | HR 82 | Ht 71.0 in | Wt 292.0 lb

## 2022-02-12 DIAGNOSIS — G8929 Other chronic pain: Secondary | ICD-10-CM

## 2022-02-12 DIAGNOSIS — M25562 Pain in left knee: Secondary | ICD-10-CM

## 2022-02-12 DIAGNOSIS — E669 Obesity, unspecified: Secondary | ICD-10-CM

## 2022-02-12 DIAGNOSIS — F3289 Other specified depressive episodes: Secondary | ICD-10-CM

## 2022-02-12 DIAGNOSIS — E118 Type 2 diabetes mellitus with unspecified complications: Secondary | ICD-10-CM | POA: Diagnosis not present

## 2022-02-12 DIAGNOSIS — Z6839 Body mass index (BMI) 39.0-39.9, adult: Secondary | ICD-10-CM

## 2022-02-12 DIAGNOSIS — Z794 Long term (current) use of insulin: Secondary | ICD-10-CM

## 2022-02-12 DIAGNOSIS — Z7985 Long-term (current) use of injectable non-insulin antidiabetic drugs: Secondary | ICD-10-CM

## 2022-02-12 MED ORDER — TIRZEPATIDE 12.5 MG/0.5ML ~~LOC~~ SOAJ: 12.5000 mg | SUBCUTANEOUS | 0 refills | Status: DC

## 2022-02-12 MED ORDER — ESCITALOPRAM OXALATE 10 MG PO TABS: 10.0000 mg | ORAL_TABLET | Freq: Every day | ORAL | 0 refills | Status: DC

## 2022-02-12 NOTE — Patient Instructions (Signed)
Thank you for coming in today.   Call or go to the ER if you develop a large red swollen joint with extreme pain or oozing puss.    Recheck in about 6 weeks.  I've referred you to Physical Therapy.  Let us know if you don't hear from them in one week.

## 2022-02-12 NOTE — Progress Notes (Unsigned)
I, [name], LAT, ATC acting as a scribe for [name], MD.  Troy [name] is a 51 y.o. male who presents to [facility] today for L knee pain. Pt was previously seen by Dr. [name] on 04/08/20 for cervical radiculitis. Today, pt c/o L knee pain x 2 months. Pt was going down the stairs and heard a loud "pop." Pt c/o L knee feeling like it may give out on him. Pt locates pain to the L knee.  L Knee swelling: no Mechanical symptoms: yes Aggravates: being weight, stairs, transitioning to stand Treatments tried: modify stairs, strengthening, ice, Tylenol   Pertinent review of systems: No fevers or chills  Relevant historical information: Recurrent DVT right leg causing lymphedema.   Exam:  BP 126/78   Pulse 82   Ht '5\' 11"'$  (1.803 m)   Wt 292 lb (132.5 kg)   SpO2 97%   BMI 40.73 kg/m  General: Well Developed, well nourished, and in no acute distress.   MSK: Left knee mild effusion.  Decreased quadricep bulk compared to right leg.   Normal knee motion with crepitation Stable ligamentous exam. Intact strength.    Lab and Radiology Results  Procedure: Real-time Ultrasound Guided Injection of left knee superior lateral patellar space Device: Philips Affiniti 50G Images permanently stored and available for review in PACS Ultrasound evaluation prior to injection reveals small joint effusion. Verbal informed consent obtained.  Discussed risks and benefits of procedure. Warned about infection, bleeding, hyperglycemia damage to structures among others. Patient expresses understanding and agreement Time-out conducted.   Noted no overlying erythema, induration, or other signs of local infection.   Skin prepped in a sterile fashion.   Local anesthesia: Topical Ethyl chloride.   With sterile technique and under real time ultrasound guidance: 40 mg of Kenalog and 2 mL of Marcaine injected into knee joint. Fluid seen entering the joint capsule.    Completed without difficulty   Pain immediately resolved suggesting accurate placement of the medication.   Advised to call if fevers/chills, erythema, induration, drainage, or persistent bleeding.   Images permanently stored and available for review in the ultrasound unit.  Impression: Technically successful ultrasound guided injection.    X-ray images left knee obtained today personally and independently interpreted Mild lateral compartment DJD.  No acute fractures. Await formal radiology review     Assessment and Plan: 51 y.o. 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  There may also be some patellofemoral pain syndrome related quad weakness ongoing.  Plan to treat with injection as above as well as physical therapy referral.  Check back in about 6 weeks.   PDMP not reviewed this encounter. Orders Placed This Encounter  Procedures   DG Knee AP/LAT W/Sun[place] Left    Standing Status:   Future    Number of Occurrences:   1    Standing Expiration Date:   02/13/2023    Order Specific Question:   Reason for Exam (SYMPTOM  OR DIAGNOSIS REQUIRED)    Answer:   eval left knee    Order Specific Question:   Preferred imaging location?    Answer:   [facility]   [name] LIMITED JOINT SPACE STRUCTURES LOW LEFT(NO LINKED CHARGES)    Order Specific Question:   Reason for Exam (SYMPTOM  OR DIAGNOSIS REQUIRED)    Answer:   left knee pain    Order Specific Question:   Preferred imaging location?  Answer:   [place] [facility]   Ambulatory referral to Physical Therapy    Referral Priority:   Routine    Referral Type:   Physical Medicine    Referral Reason:   Specialty Services Required    Requested Specialty:   Physical Therapy    Number of Visits Requested:   1   No orders of the defined types were placed in this encounter.    Discussed warning signs or symptoms. Please see discharge instructions. Patient expresses  understanding.   The above documentation has been reviewed and is accurate and complete [name], M.D.

## 2022-02-14 NOTE — Progress Notes (Signed)
Left knee x-ray looks normal to radiology

## 2022-02-19 ENCOUNTER — Encounter: Payer: Self-pay | Admitting: Emergency Medicine

## 2022-02-19 ENCOUNTER — Ambulatory Visit: Payer: BC Managed Care – PPO | Attending: Family Medicine | Admitting: Physical Therapy

## 2022-02-19 ENCOUNTER — Ambulatory Visit
Admission: EM | Admit: 2022-02-19 | Discharge: 2022-02-19 | Disposition: A | Discharge status: Home / Self Care | Payer: BC Managed Care – PPO | Attending: Family Medicine | Admitting: Family Medicine

## 2022-02-19 ENCOUNTER — Other Ambulatory Visit: Payer: Self-pay

## 2022-02-19 DIAGNOSIS — G8929 Other chronic pain: Secondary | ICD-10-CM | POA: Insufficient documentation

## 2022-02-19 DIAGNOSIS — R2689 Other abnormalities of gait and mobility: Secondary | ICD-10-CM | POA: Diagnosis not present

## 2022-02-19 DIAGNOSIS — M25562 Pain in left knee: Secondary | ICD-10-CM | POA: Insufficient documentation

## 2022-02-19 DIAGNOSIS — R0789 Other chest pain: Secondary | ICD-10-CM | POA: Diagnosis not present

## 2022-02-19 DIAGNOSIS — S61011A Laceration without foreign body of right thumb without damage to nail, initial encounter: Secondary | ICD-10-CM

## 2022-02-19 DIAGNOSIS — M6281 Muscle weakness (generalized): Secondary | ICD-10-CM | POA: Diagnosis not present

## 2022-02-19 DIAGNOSIS — R079 Chest pain, unspecified: Secondary | ICD-10-CM | POA: Diagnosis not present

## 2022-02-19 MED ORDER — AMOXICILLIN-POT CLAVULANATE 875-125 MG PO TABS: 1.0000 | ORAL_TABLET | Freq: Two times a day (BID) | ORAL | 0 refills | Status: DC

## 2022-02-19 NOTE — Discharge Instructions (Addendum)
Advised patient to leave wound area open to heal by secondary intention/scab formation.  Advised patient to take medication as directed with food to completion.  Encouraged patient to increase daily water intake to 64 ounces per day while taking this medication.  Advised if symptoms worsen and/or unresolved please follow-up with PCP, wound care, or here for further evaluation.
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
## 2022-02-19 NOTE — ED Provider Notes (Signed)
[name]    CSN: [id number] Arrival date & time: 02/19/22  0813      History   Chief Complaint Chief Complaint  Patient presents with   Extremity Laceration    Troy [name] Troy [name] is a 51 y.o. male.   Troy [name] 51 year old male presents with right thumb laceration sustained 3 days ago when using a mandolin to slice cucumbers.  Patient is now concerned that wound is not healing and is concerned with an infection.  PMH significant for morbid obesity, T2DM, and OSA. Patient reports using OTC liquid bandage initially after wound occurred.  Past Medical History:  Diagnosis Date   Arm weakness    right    Asthma    B12 deficiency    Back pain    Complication of anesthesia    "as a teenager , as they were waken up, I was shaking all over- they called it convulsions -it has never happen again"   Diabetes insipidus ([place])    Diabetes mellitus without complication ([place])    TYPE II   DVT (deep venous thrombosis) ([name])    right leg last time 2020   Leg edema, right    Male infertility    Neck injury 12/22/2013   Neuropathy    Obesity    Obesity    OSA (obstructive sleep apnea)    CPAP    Patient Active Problem List   Diagnosis Date Noted   Left knee pain 01/15/2022   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   Class 2 severe obesity with serious comorbidity and body mass index (BMI) of 38.0 to 38.9 in adult ([place]) 12/19/2021   Viral URI 11/30/2021   Depression 10/31/2021   Syncope 03/23/2021   Elevated hemoglobin ([name]) 01/16/2021   Orthostatic dizzinesshypotension 11/24/2020   Mandible pain 07/13/2020   Diarrhea 05/16/2020   Cervical radiculitis 04/12/2020   Constipation 03/31/2020   Cervicalgia 02/29/2020   Radiculopathy 01/25/2020   Hyperlipidemia associated with type 2 diabetes mellitus ([place]) 12/22/2019   Vitamin D deficiency 09/08/2019   Low HDL (under 40) 09/08/2019   Pancreatic insufficiency 03/28/2018   Type 2 diabetes mellitus with complication, with long-term  current use of insulin ([place]) 09/16/2017   DVT (deep venous thrombosis), right 04/28/2015   Acute deep vein thrombosis (DVT) of femoral vein of right lower extremity ([place]) 04/28/2015   Hemorrhoid 03/23/2013   Varicose vein of leg right 03/23/2013   Visit for preventive health examination 03/23/2013   Diastasis recti 12/22/2012   Tendinitis of right shoulder 09/27/2011   Right shoulder pain 09/27/2011   Allergic rhinitis, cause unspecified 05/23/2011   Sleep disturbance, unspecified 02/21/2011   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   Preventative health care 12/05/2010   Chronic diarrhea of unknown origin pt says not from metformin 12/05/2010   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   URINARY URGENCY 01/21/2009   Hyperlipidemia 01/23/2008   Obstructive sleep apnea 12/22/2007   VARICOSE VEINS, LOWER EXTREMITIES 10/03/2007   ASTHMA 09/19/2007   Mild intermittent asthma 09/19/2007   PLANTAR FASCIITIS, LEFT 06/23/2007   RASH AND OTHER NONSPECIFIC SKIN ERUPTION 05/26/2007   EDEMA 05/26/2007   Class 2 severe obesity with serious comorbidity and body mass index (BMI) of 39.0 to 39.9 in adult ([place]) 02/13/2007   DVT, HX OF 01/15/2007   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    Past Surgical History:  Procedure Laterality Date   ANTERIOR CERVICAL DECOMP/DISCECTOMY FUSION N/A 05/25/2020   Procedure: ANTERIOR CERVICAL DECOMPRESSION/DISCECTOMY FUSION, INTERBODY PROSTHESIS, PLATE/SCREWS CERVICAL FIVE- CERVICAL SIX;  Surgeon: Newman Pies, MD;  Location: [place];  Service: Neurosurgery;  Laterality: N/A;   BACK SURGERY     CERVICAL DISC SURGERY     C6/C7 2009   cubital tunnel left arm     2003   ELBOW SURGERY     FIBULA FRACTURE SURGERY     plate & pin removed due to infection [place] Medications    Prior to Admission medications   Medication Sig Start Date End Date Taking? Authorizing Provider  amoxicillin-clavulanate (AUGMENTIN) 875-125 MG tablet Take 1  tablet by mouth every 12 (twelve) hours. 02/19/22  Yes Eliezer Lofts, FNP  albuterol (PROVENTIL HFA;VENTOLIN HFA) 108 (90 Base) MCG/ACT inhaler Inhale 2 puffs into the lungs every 6 (six) hours as needed. For wheezing 07/22/17   [name], [name], MD  apixaban (ELIQUIS) 5 MG TABS tablet TAKE 1 TABLET(5 MG) BY MOUTH TWICE DAILY 10/04/21   [name], [name], MD  atorvastatin (LIPITOR) 40 MG tablet TAKE 1 TABLET DAILY 10/04/21   [name], [name], MD  cholecalciferol (VITAMIN D3) 25 MCG (1000 UNIT) tablet Take 1,000 Units by mouth daily.    [provider]  Continuous Blood Gluc Sensor (DEXCOM G7 SENSOR) MISC 3 each by Does not apply route every 30 (thirty) days. Apply 1 sensor every 10 days 12/12/21   Philemon Kingdom, MD  escitalopram (LEXAPRO) 10 MG tablet Take 1 tablet (10 mg total) by mouth daily. 02/12/22   Dennard Nip D, MD  insulin aspart (NOVOLOG) 100 UNIT/ML injection Use 80 units a day in the insulin pump 12/12/21   Philemon Kingdom, MD  Insulin Disposable Pump (V-GO 30) 30 UNIT/24HR KIT Inject 1 each into the skin daily. 12/12/21   Philemon Kingdom, MD  JARDIANCE 25 MG TABS tablet TAKE 1 TABLET EVERY DAY BEFORE BREAKFAST 11/07/21   Philemon Kingdom, MD  metFORMIN (GLUCOPHAGE-XR) 500 MG 24 hr tablet TAKE 4 TABLETS DAILY WITH BREAKFAST 12/12/21   Philemon Kingdom, MD  tirzepatide [facility]) 12.5 MG/0.5ML Pen Inject 12.5 mg into the skin once a week. 02/12/22   Starlyn Skeans, MD    Family History Family History  Problem Relation Age of Onset   Heart disease Mother    Other Mother        clotting disorders   Diabetes Mother    High blood pressure Mother    Kidney disease Mother    Depression Mother    Sleep apnea Mother    Obesity Mother    Other Father        sudden death/cervical and lumbar disk disease   Aneurysm Father        In his 52s smoked   Hyperlipidemia Father    High blood pressure Father    Sudden death Father    Alcoholism Father    Hypertension Other     Allergies Other    Deep vein thrombosis Other    Sleep apnea Other    Obesity Other     Social History Social History   Tobacco Use   Smoking status: Never   Smokeless tobacco: Never  Vaping Use   Vaping Use: Never used  Substance Use Topics   Alcohol use: Yes    Comment: 1 - 2 a year   Drug use: No     Allergies   Patient has no known allergies.   Review of Systems Review of Systems  Skin:  Positive for wound.  Physical Exam Triage Vital Signs ED Triage Vitals  Enc Vitals Group     BP 02/19/22 0852 117/79     Pulse Rate 02/19/22 0852 97     Resp 02/19/22 0852 16     Temp 02/19/22 0852 98.7 F (37.1 C)     Temp Source 02/19/22 0852 Oral     SpO2 02/19/22 0852 97 %     Weight 02/19/22 0853 286 lb (129.7 kg)     Height 02/19/22 0853 _0  (1.778 m)     Head Circumference --      Peak Flow --      Pain Score 02/19/22 0852 1     Pain Loc --      Pain Edu? --      Excl. in [place]? --    No data found.  Updated Vital Signs BP 117/79 (BP Location: Left Arm)   Pulse 97   Temp 98.7 F (37.1 C) (Oral)   Resp 16   Ht _1  (1.778 m)   Wt 286 lb (129.7 kg)   SpO2 97%   BMI 41.04 kg/m    Physical Exam Vitals and nursing note reviewed.  Constitutional:      Appearance: Normal appearance. He is obese. He is not ill-appearing.  HENT:     Head: Normocephalic and atraumatic.     Mouth/Throat:     Mouth: Mucous membranes are moist.     Pharynx: Oropharynx is clear.  Eyes:     Extraocular Movements: Extraocular movements intact.     Conjunctiva/sclera: Conjunctivae normal.     Pupils: Pupils are equal, round, and reactive to light.  Cardiovascular:     Rate and Rhythm: Normal rate and regular rhythm.     Pulses: Normal pulses.     Heart sounds: Normal heart sounds. No murmur heard. Pulmonary:     Effort: Pulmonary effort is normal.     Breath sounds: Normal breath sounds. No wheezing, rhonchi or rales.  Musculoskeletal:        General: Normal range  of motion.     Cervical back: Normal range of motion and neck supple.  Skin:    General: Skin is warm and dry.     Comments: Right thumb (volar aspect of DIP): Mildly erythematous, healing laceration-please see image below  Neurological:     General: No focal deficit present.     Mental Status: He is alert and oriented to person, place, and time.      UC Treatments / Results  Labs (all labs ordered are listed, but only abnormal results are displayed) Labs Reviewed - No data to display  EKG   Radiology No results found.  Procedures Procedures (including critical care time)  Medications Ordered in UC Medications - No data to display  Initial Impression / Assessment and Plan / UC Course  I have reviewed the triage vital signs and the nursing notes.  Pertinent labs & imaging results that were available during my care of the patient were reviewed by me and considered in my medical decision making (see chart for details).     MDM: 1.  Laceration of right thumb without foreign body without damage to nail, initial encounter-Rx'd Augmentin. Advised patient to leave wound area open to heal by secondary intention/scab formation.  Advised patient to take medication as directed with food to completion.  Encouraged patient to increase daily water intake to 64 ounces per day while taking this medication.  Advised if symptoms worsen and/or unresolved  please follow-up with PCP, wound care, or here for further evaluation.  Final Clinical Impressions(s) / UC Diagnoses   Final diagnoses:  Laceration of right thumb without foreign body without damage to nail, initial encounter     Discharge Instructions      Advised patient to leave wound area open to heal by secondary intention/scab formation.  Advised patient to take medication as directed with food to completion.  Encouraged patient to increase daily water intake to 64 ounces per day while taking this medication.  Advised if symptoms  worsen and/or unresolved please follow-up with PCP, wound care, or here for further evaluation.     ED Prescriptions     Medication Sig Dispense Auth. Provider   amoxicillin-clavulanate (AUGMENTIN) 875-125 MG tablet Take 1 tablet by mouth every 12 (twelve) hours. 14 tablet Eliezer Lofts, FNP      PDMP not reviewed this encounter.   [place], [place], [place] 02/19/22 [phone]

## 2022-02-19 NOTE — ED Triage Notes (Signed)
Rt thumb laceration x 3 days ago using a mandolin to slice cucumbers and got too close to the blade clipping the end of of rt thumb. Bleeding controlled.

## 2022-02-20 ENCOUNTER — Telehealth: Payer: Self-pay

## 2022-02-20 ENCOUNTER — Encounter: Payer: Self-pay | Admitting: Internal Medicine

## 2022-02-20 DIAGNOSIS — R079 Chest pain, unspecified: Secondary | ICD-10-CM | POA: Diagnosis not present

## 2022-02-20 NOTE — Telephone Encounter (Signed)
TCT pt to f/u from recent visit. HIPAA compliant vm left for return call. 

## 2022-02-21 ENCOUNTER — Ambulatory Visit: Payer: BC Managed Care – PPO | Admitting: Internal Medicine

## 2022-02-21 ENCOUNTER — Encounter: Payer: Self-pay | Admitting: Internal Medicine

## 2022-02-21 VITALS — BP 148/81 | HR 95 | Ht 70.0 in | Wt 285.0 lb

## 2022-02-21 DIAGNOSIS — E118 Type 2 diabetes mellitus with unspecified complications: Secondary | ICD-10-CM

## 2022-02-21 DIAGNOSIS — E782 Mixed hyperlipidemia: Secondary | ICD-10-CM

## 2022-02-21 DIAGNOSIS — R079 Chest pain, unspecified: Secondary | ICD-10-CM | POA: Diagnosis not present

## 2022-02-21 DIAGNOSIS — I10 Essential (primary) hypertension: Secondary | ICD-10-CM

## 2022-02-21 NOTE — Progress Notes (Signed)
Primary Physician/Referring:  Burnis Medin, MD  Patient ID: Troy [name], male    DOB: 1970/07/06, 51 y.o.   MRN: [id number]  Chief Complaint  Patient presents with   Chest Pain   Follow-up   HPI:    Troy [name]  is a 51 y.o. male with past medical history significant for hypertension, hyperlipidemia, and diabetes who is here to establish care with cardiology.  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  Since then he has been on Eliquis and he will remain on it forever.  Patient is here due to chest pain and dyspnea on exertion that has been going on for a couple of months now.  This is usually not normal for him.  Patient is beginning to get concerned as the shortness of breath is getting worse when he is doing things.  He then has to stop and rest.  He has not tried sublingual nitro or anything to alleviate this.  Patient denies palpitations, diaphoresis, syncope, edema, orthopnea, PND, claudication.  Past Medical History:  Diagnosis Date   Arm weakness    right    Asthma    B12 deficiency    Back pain    Complication of anesthesia    "as a teenager , as they were waken up, I was shaking all over- they called it convulsions -it has never happen again"   Diabetes insipidus ([place])    Diabetes mellitus without complication ([place])    TYPE II   DVT (deep venous thrombosis) ([name])    right leg last time 2020   Leg edema, right    Male infertility    Neck injury 12/22/2013   Neuropathy    Obesity    Obesity    OSA (obstructive sleep apnea)    CPAP   Past Surgical History:  Procedure Laterality Date   ANTERIOR CERVICAL DECOMP/DISCECTOMY FUSION N/A 05/25/2020   Procedure: ANTERIOR CERVICAL DECOMPRESSION/DISCECTOMY FUSION, INTERBODY PROSTHESIS, PLATE/SCREWS CERVICAL FIVE- CERVICAL SIX;  Surgeon: Newman Pies, MD;  Location: [place];  Service: Neurosurgery;  Laterality: N/A;   BACK SURGERY     CERVICAL DISC SURGERY      C6/C7 2009   cubital tunnel left arm     2003   ELBOW SURGERY     FIBULA FRACTURE SURGERY     plate & pin removed due to infection [place]     Family History  Problem Relation Age of Onset   Heart disease Mother    Other Mother        clotting disorders   Diabetes Mother    High blood pressure Mother    Kidney disease Mother    Depression Mother    Sleep apnea Mother    Obesity Mother    Other Father        sudden death/cervical and lumbar disk disease   Aneurysm Father        In his 60s smoked   Hyperlipidemia Father    High blood pressure Father    Sudden death Father    Alcoholism Father    Hypertension Other    Allergies Other    Deep vein thrombosis Other    Sleep apnea Other    Obesity Other     Social History   Tobacco Use   Smoking status: Never   Smokeless tobacco: Never  Substance Use Topics   Alcohol use:  Yes    Comment: 1 - 2 a year   Marital Status: Married  ROS  Review of Systems  Cardiovascular:  Positive for chest pain and dyspnea on exertion.  Respiratory:  Positive for shortness of breath.    Objective  Blood pressure (!) 148/81, pulse 95, height _0  (1.778 m), weight 285 lb (129.3 kg), SpO2 98 %. Body mass index is 40.89 kg/m.     02/21/2022    9:15 AM 02/19/2022    8:53 AM 02/19/2022    8:52 AM  Vitals with BMI  Height _1  _2    Weight 285 lbs 286 lbs   BMI 60.10 93.23   Systolic 557  322  Diastolic 81  79  Pulse 95  97     Physical Exam Vitals reviewed.  HENT:     Head: Normocephalic and atraumatic.  Cardiovascular:     Rate and Rhythm: Normal rate and regular rhythm.     Pulses: Normal pulses.     Heart sounds: Normal heart sounds. No murmur heard. Pulmonary:     Effort: Pulmonary effort is normal.     Breath sounds: Normal breath sounds.  Abdominal:     General: Bowel sounds are normal.  Musculoskeletal:     Right lower leg: No edema.     Left lower leg: No edema.  Skin:    General:  Skin is warm and dry.  Neurological:     Mental Status: He is alert.     Medications and allergies  No Known Allergies   Medication list after today's encounter   Current Outpatient Medications:    albuterol (PROVENTIL HFA;VENTOLIN HFA) 108 (90 Base) MCG/ACT inhaler, Inhale 2 puffs into the lungs every 6 (six) hours as needed. For wheezing, Disp: 1 Inhaler, Rfl: 2   amoxicillin-clavulanate (AUGMENTIN) 875-125 MG tablet, Take 1 tablet by mouth every 12 (twelve) hours., Disp: 14 tablet, Rfl: 0   apixaban (ELIQUIS) 5 MG TABS tablet, TAKE 1 TABLET(5 MG) BY MOUTH TWICE DAILY, Disp: 180 tablet, Rfl: 2   atorvastatin (LIPITOR) 40 MG tablet, TAKE 1 TABLET DAILY, Disp: 90 tablet, Rfl: 2   cholecalciferol (VITAMIN D3) 25 MCG (1000 UNIT) tablet, Take 1,000 Units by mouth daily., Disp: , Rfl:    escitalopram (LEXAPRO) 10 MG tablet, Take 1 tablet (10 mg total) by mouth daily., Disp: 90 tablet, Rfl: 0   insulin aspart (NOVOLOG) 100 UNIT/ML injection, Use 80 units a day in the insulin pump, Disp: 90 mL, Rfl: 3   Insulin Disposable Pump (V-GO 30) 30 UNIT/24HR KIT, Inject 1 each into the skin daily., Disp: 90 kit, Rfl: 3   JARDIANCE 25 MG TABS tablet, TAKE 1 TABLET EVERY DAY BEFORE BREAKFAST, Disp: 90 tablet, Rfl: 2   metFORMIN (GLUCOPHAGE-XR) 500 MG 24 hr tablet, TAKE 4 TABLETS DAILY WITH BREAKFAST, Disp: 360 tablet, Rfl: 3   tirzepatide (MOUNJARO) 12.5 MG/0.5ML Pen, Inject 12.5 mg into the skin once a week., Disp: 2 mL, Rfl: 0   Continuous Blood Gluc Sensor (DEXCOM G7 SENSOR) MISC, 3 each by Does not apply route every 30 (thirty) days. Apply 1 sensor every 10 days, Disp: 9 each, Rfl: 4  Laboratory examination:   Lab Results  Component Value Date   NA 137 01/31/2022   K 4.4 01/31/2022   CO2 27 01/31/2022   GLUCOSE 163 (H) 01/31/2022   BUN 14 01/31/2022   CREATININE 0.82 01/31/2022   CALCIUM 9.0 01/31/2022   EGFR 109 03/23/2021   GFRNONAA >60 10/31/2020  Latest Ref Rng & Units 01/31/2022    10:42 AM 03/23/2021    8:02 AM 10/31/2020    3:06 PM  CMP  Glucose 70 - 99 mg/dL 163  115  151   BUN 6 - 23 mg/dL _0 Creatinine 0.40 - 1.50 mg/dL 0.82  0.76  0.94   Sodium 135 - 145 mEq/L 137  141  138   Potassium 3.5 - 5.1 mEq/L 4.4  4.4  4.1   Chloride 96 - 112 mEq/L 101  105  99   CO2 19 - 32 mEq/L _1 Calcium 8.4 - 10.5 mg/dL 9.0  9.4  9.4   Total Protein 6.0 - 8.3 g/dL 6.9  6.8    Total Bilirubin 0.2 - 1.2 mg/dL 0.8  0.7    Alkaline Phos 39 - 117 U/L 59  70    AST 0 - 37 U/L 15  15    ALT 0 - 53 U/L 23  23        Latest Ref Rng & Units 01/31/2022   10:42 AM 10/31/2020    3:06 PM 05/25/2020   12:40 PM  CBC  WBC 4.0 - 10.5 K/uL 8.6  9.6  10.3   Hemoglobin 13.0 - 17.0 g/dL 16.7  16.3  16.5   Hematocrit 39.0 - 52.0 % 49.1  47.1  48.3   Platelets 150.0 - 400.0 K/uL 239.0  248  208     Lipid Panel Recent Labs    01/31/22 1042  CHOL 148  TRIG 191.0*  LDLCALC 71  VLDL 38.2  HDL 39.20  CHOLHDL 4    HEMOGLOBIN A1C Lab Results  Component Value Date   HGBA1C 7.4 (A) 12/12/2021   MPG 145.59 05/25/2020   TSH Recent Labs    01/31/22 1042  TSH 0.82    External labs:     Radiology:    Cardiac Studies:     EKG:   02/21/2022: normal sinus, normal R wave progression, no evidence of ischemia  Assessment     ICD-10-CM   1. Chest pain, typical angina  R07.9 EKG 12-Lead    PCV ECHOCARDIOGRAM COMPLETE    PCV MYOCARDIAL PERFUSION WO LEXISCAN    2. Essential hypertension  I10 PCV ECHOCARDIOGRAM COMPLETE    PCV MYOCARDIAL PERFUSION WO LEXISCAN    3. Mixed hyperlipidemia  E78.2 PCV ECHOCARDIOGRAM COMPLETE    PCV MYOCARDIAL PERFUSION WO LEXISCAN    4. Type 2 diabetes mellitus with complication, with long-term current use of insulin ([name])  E11.8 PCV ECHOCARDIOGRAM COMPLETE   Z79.4 PCV MYOCARDIAL PERFUSION WO LEXISCAN       Orders Placed This Encounter  Procedures   PCV MYOCARDIAL PERFUSION WO LEXISCAN    Standing Status:   Future     Standing Expiration Date:   04/24/2022   EKG 12-Lead   PCV ECHOCARDIOGRAM COMPLETE    Standing Status:   Future    Standing Expiration Date:   02/22/2023    No orders of the defined types were placed in this encounter.   There are no discontinued medications.   Recommendations:   Troy [name] is a 51 y.o.  male with HTN, HLD, and DM2 presenting with chest pain  Chest pain, typical angina Stress test and echocardiogram ordered Sublingual nitro sent, use PRN Patient instructed not to do heavy lifting, heavy exertional activity, swimming until evaluation is complete.  Patient instructed to call if symptoms worse or to  go to the ED for further evaluation.   Essential hypertension Will initiate anti-hypertensive next visit if BP still high Patient to keep BP log until next visit Encourage low-sodium diet, less than 2000 mg daily. Follow-up in 2 months or sooner if needed.   Mixed hyperlipidemia Continue lipitor 45m daily   Type 2 diabetes mellitus with complication, with long-term current use of insulin ([facility] Managed by primary Continue current regiment     SFloydene Flock DO, [facility] 02/28/2022, 9:49 AM Office: [phone]: [phone]

## 2022-02-22 NOTE — Progress Notes (Signed)
Chief Complaint:   OBESITY Troy [name] is here to discuss his progress with his obesity treatment plan along with follow-up of his obesity related diagnoses. Troy [name] is on the Category 3 Plan and states he is following his eating plan approximately 90-95% of the time. Troy [name] states he is doing 0 minutes 0 times per week.  Today's visit was #: 98 Starting weight: 293 lbs Starting date: 09/07/2019 Today's weight: 285 lbs Today's date: 02/12/2022 Total lbs lost to date: 8 Total lbs lost since last in-office visit: 10  Interim History: Troy [name] has done well with his weight loss. He is doing better with portion control especially with sugars and sweets.   Subjective:   1. Type 2 diabetes mellitus with complication, with long-term current use of insulin ([place]) Troy [name] is on [place] and he notes some initial nausea, but this has resolved. No other side effects were noted.   2. Chronic pain of left knee Troy [name]'s left knee has been popping and his pain has increased. He denies recent falls or injury. He sees Dr. [name] this afternoon.   3. Other depression, with emotional eating behaviors Troy [name] is stable on his medications, and he is doing well with decreasing emotional eating behaviors. No side effects were noted.   Assessment/Plan:   1. Type 2 diabetes mellitus with complication, with long-term current use of insulin ([place]) Troy [name] will continue Mounjaro 12.5 mg once weekly, and we will refill for 1 month.   - tirzepatide (MOUNJARO) 12.5 MG/0.5ML Pen; Inject 12.5 mg into the skin once a week.  Dispense: 2 mL; Refill: 0  2. Chronic pain of left knee Troy [name] will hold off on his exercise until Dr. [name] assess.   3. Other depression, with emotional eating behaviors Troy [name] will continue Lexapro 10 mg once daily, and we will refill for 90 days.   - escitalopram (LEXAPRO) 10 MG tablet; Take 1 tablet (10 mg total) by mouth daily.  Dispense: 90 tablet; Refill: 0  4. Obesity, Current  BMI 39.9 Troy [name] is currently in the action stage of change. As such, his goal is to continue with weight loss efforts. He has agreed to the Category 3 Plan.   Behavioral modification strategies: increasing lean protein intake, meal planning and cooking strategies, and holiday eating strategies .  Troy [name] has agreed to follow-up with our clinic in 4 weeks. He was informed of the importance of frequent follow-up visits to maximize his success with intensive lifestyle modifications for his multiple health conditions.   Objective:   Blood pressure 106/64, pulse 85, temperature 97.7 F (36.5 C), height '5\' 11"'$  (1.803 m), weight 285 lb (129.3 kg), SpO2 96 %. Body mass index is 39.75 kg/m.  General: Cooperative, alert, well developed, in no acute distress. HEENT: Conjunctivae and lids unremarkable. Cardiovascular: Regular rhythm.  Lungs: Normal work of breathing. Neurologic: No focal deficits.   Lab Results  Component Value Date   CREATININE 0.82 01/31/2022   BUN 14 01/31/2022   NA 137 01/31/2022   K 4.4 01/31/2022   CL 101 01/31/2022   CO2 27 01/31/2022   Lab Results  Component Value Date   ALT 23 01/31/2022   AST 15 01/31/2022   ALKPHOS 59 01/31/2022   BILITOT 0.8 01/31/2022   Lab Results  Component Value Date   HGBA1C 7.4 (A) 12/12/2021   HGBA1C 6.9 (A) 08/08/2021   HGBA1C 7.9 (H) 03/23/2021   HGBA1C 6.7 (A) 11/18/2020   HGBA1C 8.2 (H) 09/06/2020   Lab Results  Component Value  Date   INSULIN 7.9 09/06/2020   Lab Results  Component Value Date   TSH 0.82 01/31/2022   Lab Results  Component Value Date   CHOL 148 01/31/2022   HDL 39.20 01/31/2022   LDLCALC 71 01/31/2022   LDLDIRECT 125.0 12/26/2018   TRIG 191.0 (H) 01/31/2022   CHOLHDL 4 01/31/2022   Lab Results  Component Value Date   VD25OH 70.2 03/23/2021   VD25OH 55.3 09/06/2020   VD25OH 63.0 06/13/2020   Lab Results  Component Value Date   WBC 8.6 01/31/2022   HGB 16.7 01/31/2022   HCT 49.1  01/31/2022   MCV 97.5 01/31/2022   PLT 239.0 01/31/2022   Lab Results  Component Value Date   IRON 144 02/08/2020   TIBC 358 02/08/2020   FERRITIN 96 02/08/2020   Attestation Statements:   Reviewed by clinician on day of visit: allergies, medications, problem list, medical history, surgical history, family history, social history, and previous encounter notes.   I, Trixie Dredge, am acting as transcriptionist for Dennard Nip, MD.  I have reviewed the above documentation for accuracy and completeness, and I agree with the above. -  Dennard Nip, MD

## 2022-02-26 ENCOUNTER — Encounter: Payer: Self-pay | Admitting: Physical Therapy

## 2022-02-26 ENCOUNTER — Ambulatory Visit: Payer: BC Managed Care – PPO | Admitting: Physical Therapy

## 2022-02-26 DIAGNOSIS — R2689 Other abnormalities of gait and mobility: Secondary | ICD-10-CM

## 2022-02-26 DIAGNOSIS — M25562 Pain in left knee: Secondary | ICD-10-CM | POA: Diagnosis not present

## 2022-02-26 DIAGNOSIS — G8929 Other chronic pain: Secondary | ICD-10-CM | POA: Diagnosis not present

## 2022-02-26 DIAGNOSIS — M6281 Muscle weakness (generalized): Secondary | ICD-10-CM
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
## 2022-02-27 DIAGNOSIS — G901 Familial dysautonomia [Riley-Day]: Secondary | ICD-10-CM | POA: Diagnosis not present

## 2022-02-28 ENCOUNTER — Encounter: Payer: Self-pay | Admitting: Internal Medicine

## 2022-02-28 DIAGNOSIS — Z6841 Body Mass Index (BMI) 40.0 and over, adult: Secondary | ICD-10-CM

## 2022-02-28 DIAGNOSIS — E118 Type 2 diabetes mellitus with unspecified complications: Secondary | ICD-10-CM

## 2022-02-28 DIAGNOSIS — R079 Chest pain, unspecified: Secondary | ICD-10-CM | POA: Insufficient documentation

## 2022-02-28 DIAGNOSIS — Z794 Long term (current) use of insulin: Secondary | ICD-10-CM

## 2022-02-28 MED ORDER — NITROGLYCERIN 0.4 MG SL SUBL: 0.4000 mg | SUBLINGUAL_TABLET | SUBLINGUAL | 3 refills | Status: DC | PRN

## 2022-03-07 MED ORDER — ZEPBOUND 12.5 MG/0.5ML ~~LOC~~ SOAJ: 12.5000 mg | SUBCUTANEOUS | 5 refills | Status: DC

## 2022-03-08 ENCOUNTER — Ambulatory Visit: Payer: BC Managed Care – PPO

## 2022-03-08 DIAGNOSIS — R2689 Other abnormalities of gait and mobility: Secondary | ICD-10-CM | POA: Diagnosis not present

## 2022-03-08 DIAGNOSIS — G8929 Other chronic pain: Secondary | ICD-10-CM | POA: Diagnosis not present

## 2022-03-08 DIAGNOSIS — M25562 Pain in left knee: Secondary | ICD-10-CM | POA: Diagnosis not present

## 2022-03-08 DIAGNOSIS — M6281 Muscle weakness (generalized): Secondary | ICD-10-CM
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
## 2022-03-13 ENCOUNTER — Ambulatory Visit: Payer: BC Managed Care – PPO | Attending: Family Medicine

## 2022-03-13 DIAGNOSIS — M25562 Pain in left knee: Secondary | ICD-10-CM | POA: Diagnosis not present

## 2022-03-13 DIAGNOSIS — R2689 Other abnormalities of gait and mobility: Secondary | ICD-10-CM | POA: Diagnosis not present

## 2022-03-13 DIAGNOSIS — G8929 Other chronic pain: Secondary | ICD-10-CM | POA: Diagnosis not present

## 2022-03-13 DIAGNOSIS — M6281 Muscle weakness (generalized): Secondary | ICD-10-CM | POA: Insufficient documentation
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
## 2022-03-14 DIAGNOSIS — G901 Familial dysautonomia [Riley-Day]: Secondary | ICD-10-CM | POA: Diagnosis not present

## 2022-03-14 DIAGNOSIS — E1142 Type 2 diabetes mellitus with diabetic polyneuropathy: Secondary | ICD-10-CM | POA: Diagnosis not present

## 2022-03-14 DIAGNOSIS — E785 Hyperlipidemia, unspecified: Secondary | ICD-10-CM | POA: Diagnosis not present

## 2022-03-14 DIAGNOSIS — R55 Syncope and collapse: Secondary | ICD-10-CM | POA: Diagnosis not present

## 2022-03-19 ENCOUNTER — Ambulatory Visit: Payer: BC Managed Care – PPO | Admitting: Physical Therapy

## 2022-03-19 ENCOUNTER — Encounter: Payer: Self-pay | Admitting: Physical Therapy

## 2022-03-19 DIAGNOSIS — G8929 Other chronic pain: Secondary | ICD-10-CM | POA: Diagnosis not present

## 2022-03-19 DIAGNOSIS — M6281 Muscle weakness (generalized): Secondary | ICD-10-CM

## 2022-03-19 DIAGNOSIS — M25562 Pain in left knee: Secondary | ICD-10-CM | POA: Diagnosis not present

## 2022-03-19 DIAGNOSIS — R2689 Other abnormalities of gait and mobility: Secondary | ICD-10-CM | POA: Diagnosis not present
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
## 2022-03-20 ENCOUNTER — Encounter (INDEPENDENT_AMBULATORY_CARE_PROVIDER_SITE_OTHER): Payer: Self-pay | Admitting: Family Medicine

## 2022-03-20 ENCOUNTER — Ambulatory Visit: Payer: BC Managed Care – PPO

## 2022-03-20 ENCOUNTER — Ambulatory Visit (INDEPENDENT_AMBULATORY_CARE_PROVIDER_SITE_OTHER): Payer: BC Managed Care – PPO | Admitting: Family Medicine

## 2022-03-20 VITALS — BP 116/67 | HR 85 | Temp 97.7°F | Ht 71.0 in | Wt 289.0 lb

## 2022-03-20 DIAGNOSIS — Z6841 Body Mass Index (BMI) 40.0 and over, adult: Secondary | ICD-10-CM | POA: Diagnosis not present

## 2022-03-20 DIAGNOSIS — Z794 Long term (current) use of insulin: Secondary | ICD-10-CM | POA: Diagnosis not present

## 2022-03-20 DIAGNOSIS — Z7985 Long-term (current) use of injectable non-insulin antidiabetic drugs: Secondary | ICD-10-CM

## 2022-03-20 DIAGNOSIS — R079 Chest pain, unspecified: Secondary | ICD-10-CM

## 2022-03-20 DIAGNOSIS — E782 Mixed hyperlipidemia: Secondary | ICD-10-CM

## 2022-03-20 DIAGNOSIS — E669 Obesity, unspecified: Secondary | ICD-10-CM | POA: Diagnosis not present

## 2022-03-20 DIAGNOSIS — Z7984 Long term (current) use of oral hypoglycemic drugs: Secondary | ICD-10-CM

## 2022-03-20 DIAGNOSIS — E118 Type 2 diabetes mellitus with unspecified complications: Secondary | ICD-10-CM

## 2022-03-20 DIAGNOSIS — I10 Essential (primary) hypertension: Secondary | ICD-10-CM | POA: Diagnosis not present

## 2022-03-20 LAB — PCV MYOCARDIAL PERFUSION WO LEXISCAN
Angina Index: 0
ST Depression (mm): 0 mm

## 2022-03-20 MED ORDER — TIRZEPATIDE 12.5 MG/0.5ML ~~LOC~~ SOAJ: 12.5000 mg | SUBCUTANEOUS | 0 refills | Status: DC

## 2022-03-21 ENCOUNTER — Ambulatory Visit (INDEPENDENT_AMBULATORY_CARE_PROVIDER_SITE_OTHER): Payer: BC Managed Care – PPO | Admitting: Family Medicine

## 2022-03-21 NOTE — Progress Notes (Signed)
normal

## 2022-03-21 NOTE — Progress Notes (Signed)
Called patient no answer left a vm

## 2022-03-23 NOTE — Progress Notes (Signed)
Left vm for pt to call bacl

## 2022-03-23 NOTE — Progress Notes (Signed)
Left VM for pt to call back.

## 2022-03-26 ENCOUNTER — Encounter: Payer: Self-pay | Admitting: Physical Therapy

## 2022-03-26 ENCOUNTER — Ambulatory Visit: Payer: BC Managed Care – PPO | Admitting: Physical Therapy

## 2022-03-26 DIAGNOSIS — G8929 Other chronic pain: Secondary | ICD-10-CM

## 2022-03-26 DIAGNOSIS — R2689 Other abnormalities of gait and mobility: Secondary | ICD-10-CM

## 2022-03-26 DIAGNOSIS — M6281 Muscle weakness (generalized): Secondary | ICD-10-CM

## 2022-03-26 DIAGNOSIS — M25562 Pain in left knee: Secondary | ICD-10-CM | POA: Diagnosis not present
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
## 2022-03-26 NOTE — Progress Notes (Unsigned)
   I, [name], LAT, ATC acting as a scribe for Troy Leader, MD.  Troy [name] is a 52 y.o. male who presents to [place] at [facility] today for f/u chronic L knee pain. Pt was last seen by Dr. [name] on 02/12/22 and was given a L knee steroid injection and was referred to PT, completing 6 visits. Today, pt reports  Dx imaging: 02/12/22 L knee XR  Pertinent review of systems: ***  Relevant historical information: ***   Exam:  There were no vitals taken for this visit. General: Well Developed, well nourished, and in no acute distress.   MSK: ***    Lab and Radiology Results No results found for this or any previous visit (from the past 72 hour(s)). No results found.     Assessment and Plan: 52 y.o. male with ***   PDMP not reviewed this encounter. No orders of the defined types were placed in this encounter.  No orders of the defined types were placed in this encounter.    Discussed warning signs or symptoms. Please see discharge instructions. Patient expresses understanding.   ***

## 2022-03-27 ENCOUNTER — Ambulatory Visit (INDEPENDENT_AMBULATORY_CARE_PROVIDER_SITE_OTHER): Payer: BC Managed Care – PPO | Admitting: Family Medicine

## 2022-03-27 VITALS — BP 130/82 | HR 85 | Ht 71.0 in | Wt 292.0 lb

## 2022-03-27 DIAGNOSIS — G8929 Other chronic pain: Secondary | ICD-10-CM

## 2022-03-27 DIAGNOSIS — M25562 Pain in left knee: Secondary | ICD-10-CM

## 2022-03-27 NOTE — Patient Instructions (Signed)
Thank you for coming in today.   Recheck as needed.   Continue exercises.   Let me know if you need anything.

## 2022-03-27 NOTE — Progress Notes (Signed)
Called patient no answer left a vm

## 2022-03-29 NOTE — Progress Notes (Signed)
Called patient no answer ed a vm to call back

## 2022-03-29 NOTE — Progress Notes (Signed)
Called patient, NA, LMAM

## 2022-03-30 NOTE — Progress Notes (Signed)
Several attempts to contact patient. I left echocardiogram results on his voicemail.

## 2022-04-02 ENCOUNTER — Encounter: Payer: Self-pay | Admitting: Rehabilitative and Restorative Service Providers"

## 2022-04-02 ENCOUNTER — Ambulatory Visit: Payer: BC Managed Care – PPO | Admitting: Rehabilitative and Restorative Service Providers"

## 2022-04-02 DIAGNOSIS — M6281 Muscle weakness (generalized): Secondary | ICD-10-CM | POA: Diagnosis not present

## 2022-04-02 DIAGNOSIS — R2689 Other abnormalities of gait and mobility: Secondary | ICD-10-CM | POA: Diagnosis not present

## 2022-04-02 DIAGNOSIS — G8929 Other chronic pain: Secondary | ICD-10-CM

## 2022-04-02 DIAGNOSIS — M25562 Pain in left knee: Secondary | ICD-10-CM | POA: Diagnosis not present
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
## 2022-04-02 NOTE — Progress Notes (Signed)
Chief Complaint:   OBESITY Troy [name] is here to discuss his progress with his obesity treatment plan along with follow-up of his obesity related diagnoses. Troy [name] is on the Category 3 Plan and states he is following his eating plan approximately 80-90% of the time. [name] states he is walking for 15-30 minutes 3 times per week.  Today's visit was #: 28 Starting weight: 293 lbs Starting date: 09/07/2019 Today's weight: 289 lbs Today's date: 03/20/2022 Total lbs lost to date: 4 Total lbs lost since last in-office visit: 0  Interim History: Troy [name] continues to work his weight loss, but he is retaining some fluid. He has increased Na+ per his Cardiologist due to orthostatic hypotension.  Subjective:   1. Type 2 diabetes mellitus with complication, with long-term current use of insulin ([name]) Troy [name] is working on his diet and weight loss. He is on [place], Jardiance, metformin, and Novolog in his pump.   Assessment/Plan:   1. Type 2 diabetes mellitus with complication, with long-term current use of insulin ([name]) We will refill Mounjaro for 1 month. Troy [name] will continue his other medications, and we will continue to work with him on his diet and weight loss.   - tirzepatide (MOUNJARO) 12.5 MG/0.5ML Pen; Inject 12.5 mg into the skin once a week.  Dispense: 2 mL; Refill: 0  2. Obesity, Current BMI 40.0 Troy [name] is currently in the action stage of change. As such, his goal is to continue with weight loss efforts. He has agreed to the Category 3 Plan.   Exercise goals: As is.   Behavioral modification strategies: increasing lean protein intake.  Troy [name] has agreed to follow-up with our clinic in 4 to 6 weeks. He was informed of the importance of frequent follow-up visits to maximize his success with intensive lifestyle modifications for his multiple health conditions.   Objective:   Blood pressure 116/67, pulse 85, temperature 97.7 F (36.5 C), height '5\' 11"'$  (1.803 m), weight 289  lb (131.1 kg), SpO2 95 %. Body mass index is 40.31 kg/m.  General: Cooperative, alert, well developed, in no acute distress. HEENT: Conjunctivae and lids unremarkable. Cardiovascular: Regular rhythm.  Lungs: Normal work of breathing. Neurologic: No focal deficits.   Lab Results  Component Value Date   CREATININE 0.82 01/31/2022   BUN 14 01/31/2022   NA 137 01/31/2022   K 4.4 01/31/2022   CL 101 01/31/2022   CO2 27 01/31/2022   Lab Results  Component Value Date   ALT 23 01/31/2022   AST 15 01/31/2022   ALKPHOS 59 01/31/2022   BILITOT 0.8 01/31/2022   Lab Results  Component Value Date   HGBA1C 7.4 (A) 12/12/2021   HGBA1C 6.9 (A) 08/08/2021   HGBA1C 7.9 (H) 03/23/2021   HGBA1C 6.7 (A) 11/18/2020   HGBA1C 8.2 (H) 09/06/2020   Lab Results  Component Value Date   INSULIN 7.9 09/06/2020   Lab Results  Component Value Date   TSH 0.82 01/31/2022   Lab Results  Component Value Date   CHOL 148 01/31/2022   HDL 39.20 01/31/2022   LDLCALC 71 01/31/2022   LDLDIRECT 125.0 12/26/2018   TRIG 191.0 (H) 01/31/2022   CHOLHDL 4 01/31/2022   Lab Results  Component Value Date   VD25OH 70.2 03/23/2021   VD25OH 55.3 09/06/2020   VD25OH 63.0 06/13/2020   Lab Results  Component Value Date   WBC 8.6 01/31/2022   HGB 16.7 01/31/2022   HCT 49.1 01/31/2022   MCV 97.5 01/31/2022   PLT  239.0 01/31/2022   Lab Results  Component Value Date   IRON 144 02/08/2020   TIBC 358 02/08/2020   FERRITIN 96 02/08/2020   Attestation Statements:   Reviewed by clinician on day of visit: allergies, medications, problem list, medical history, surgical history, family history, social history, and previous encounter notes.  Time spent on visit including pre-visit chart review and post-visit care and charting was 35 minutes.   I, Trixie Dredge, am acting as transcriptionist for Dennard Nip, MD.  I have reviewed the above documentation for accuracy and completeness, and I agree with the  above. -  Dennard Nip, MD

## 2022-04-08 IMAGING — DX DG KNEE AP/LAT W/ SUNRISE*R*
3 series · 3 of 3 positions shown · non-contrast
Comparison: 09/17/2019

CLINICAL DATA: Right knee pain

EXAM:
RIGHT KNEE 3 VIEWS

[knee ap]
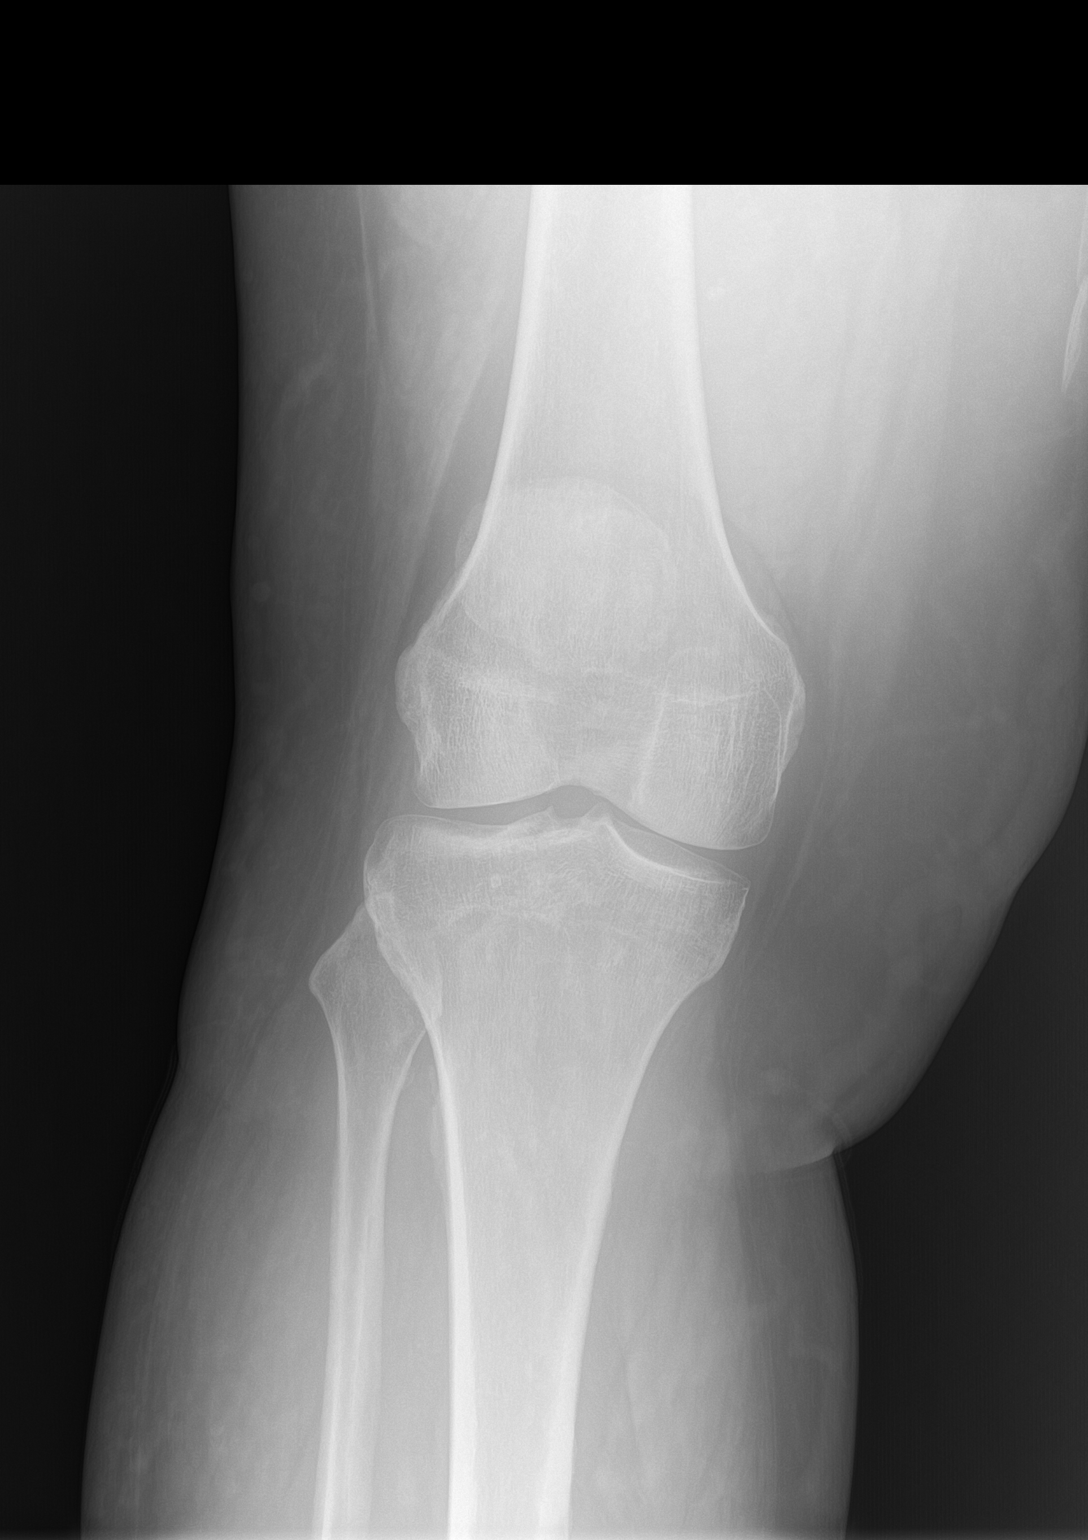

[knee lat]
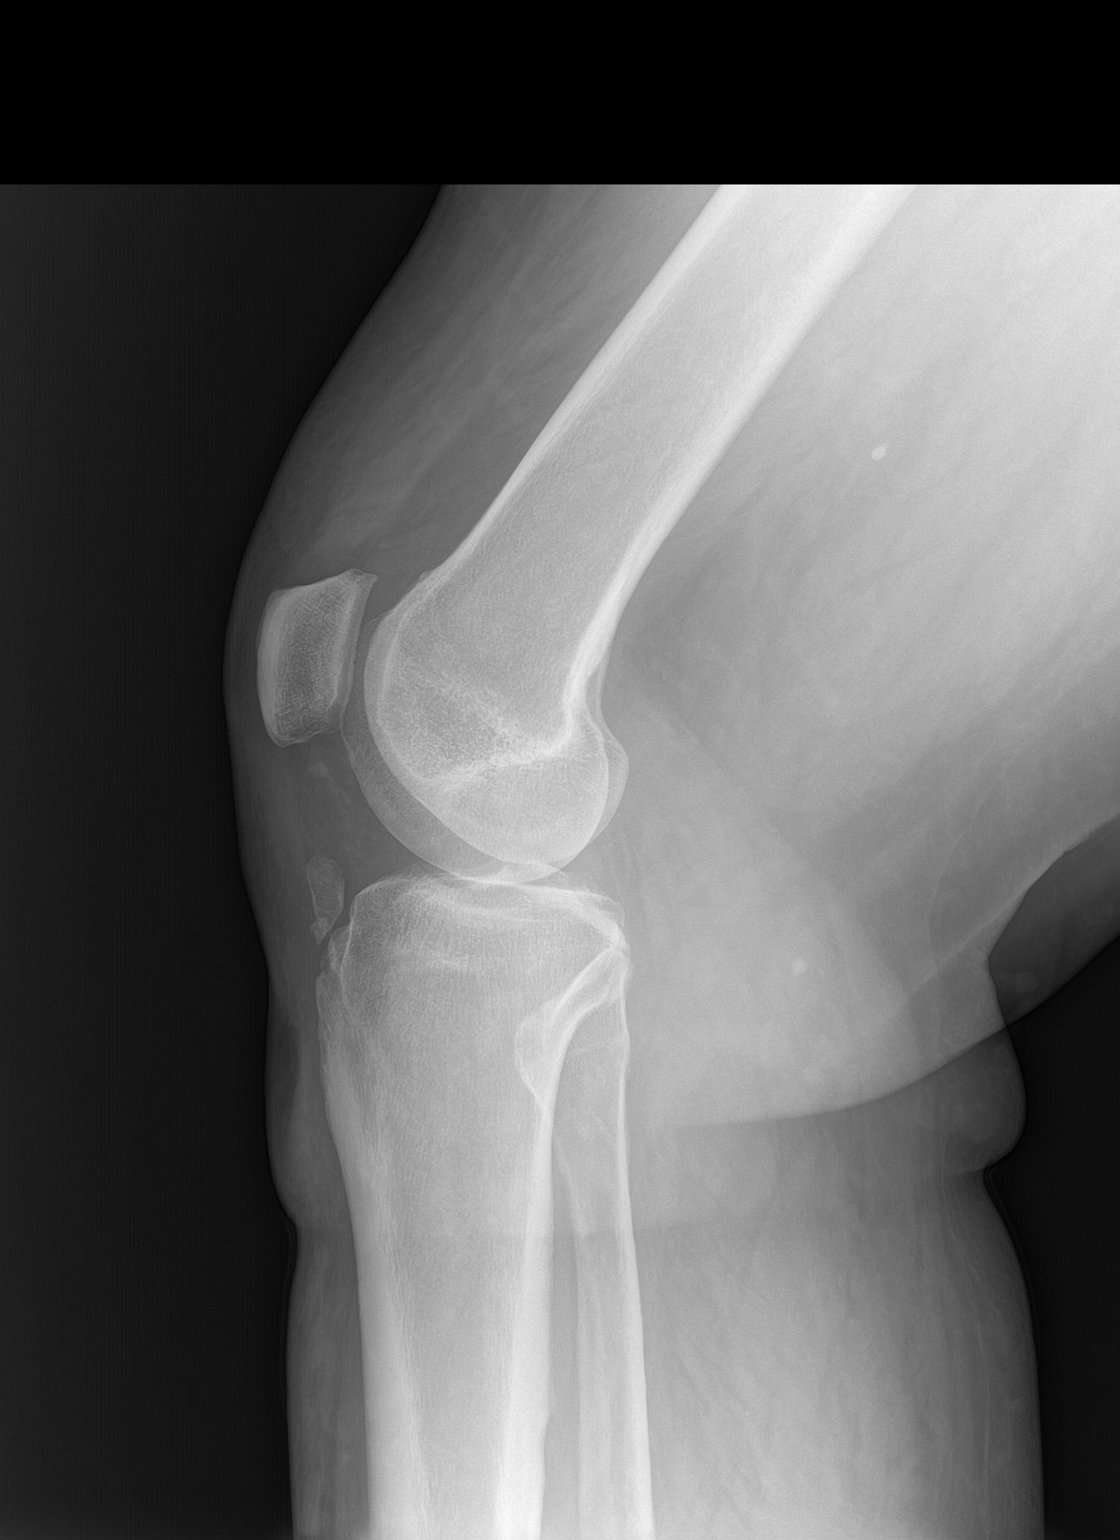

[patella]
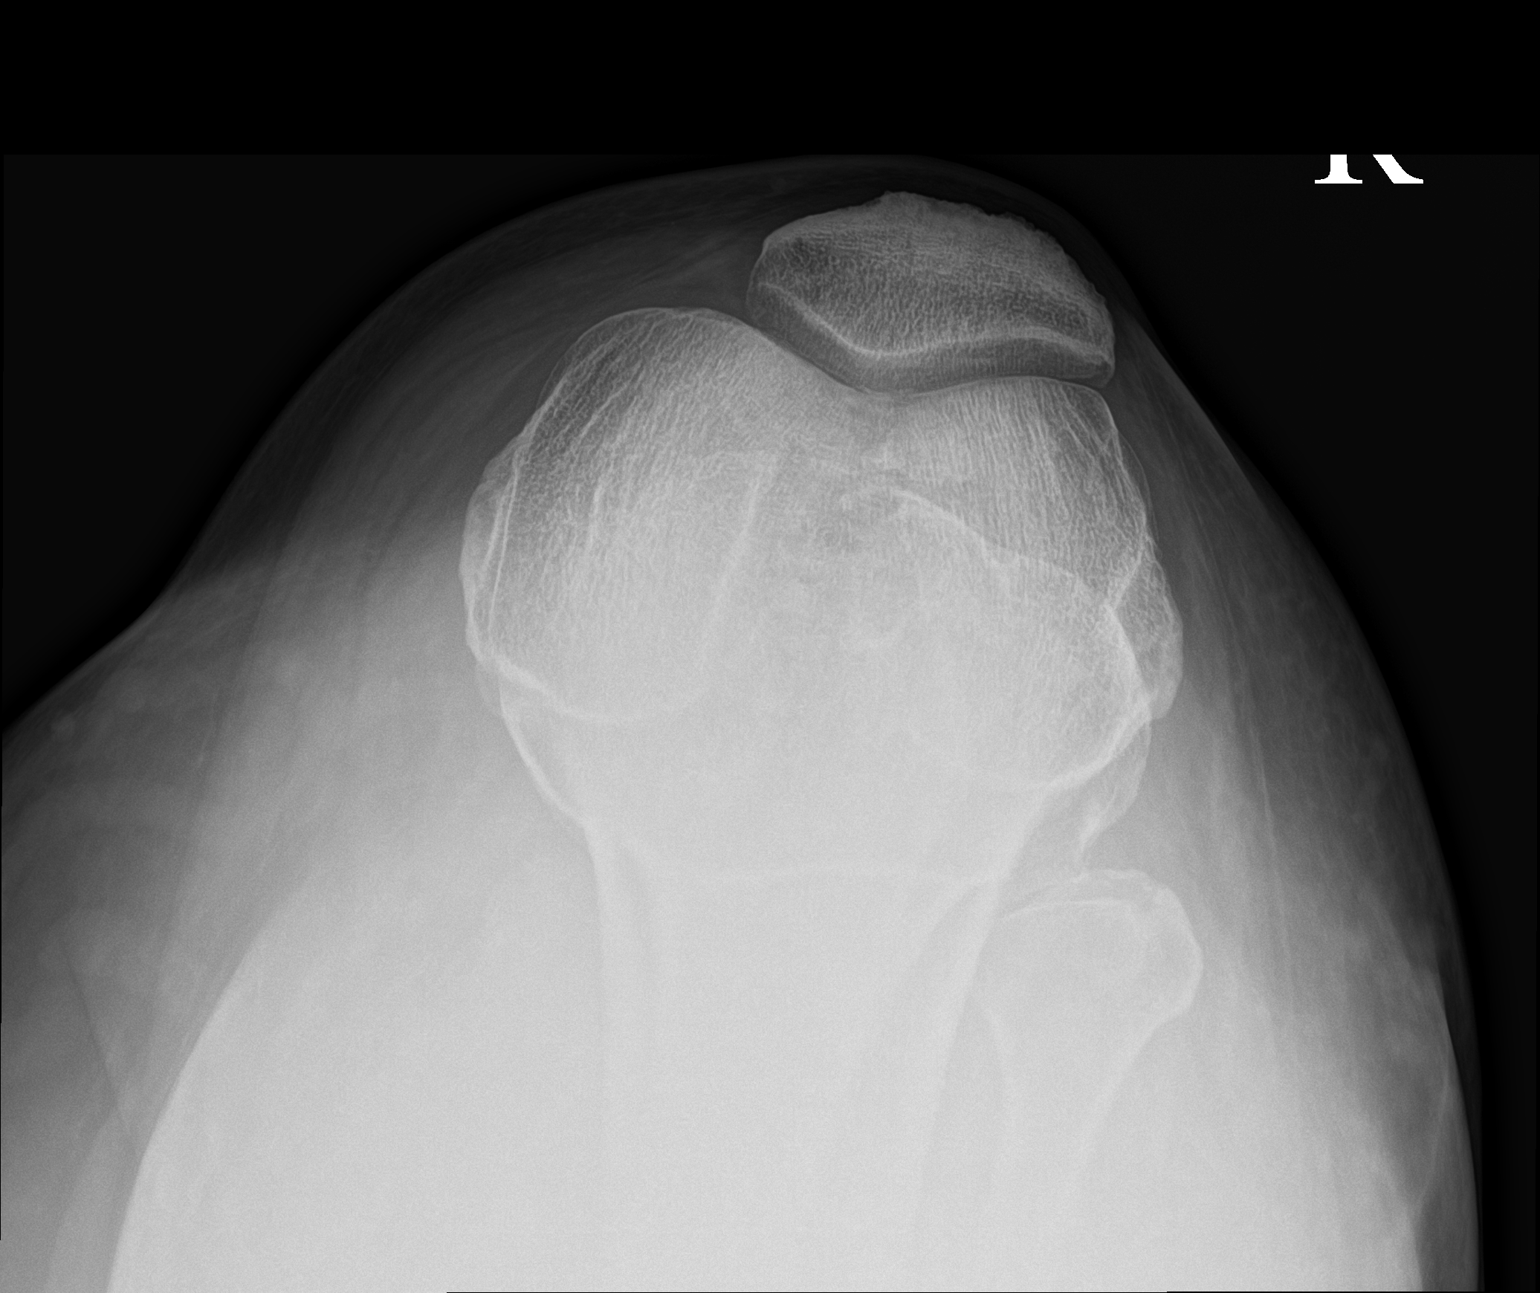

[3 of 3 positions shown; findings below may reference images not displayed]

FINDINGS: No evidence of fracture, dislocation, or joint effusion.
Calcification again noted adjacent to the tibial tuberosity. No
evidence of arthropathy or other focal bone abnormality. Soft
tissues are unremarkable.
IMPRESSION: No acute osseous abnormality or significant arthropathy of the right
knee.

## 2022-04-09 ENCOUNTER — Ambulatory Visit: Payer: BC Managed Care – PPO

## 2022-04-09 DIAGNOSIS — M6281 Muscle weakness (generalized): Secondary | ICD-10-CM | POA: Diagnosis not present

## 2022-04-09 DIAGNOSIS — G8929 Other chronic pain: Secondary | ICD-10-CM | POA: Diagnosis not present

## 2022-04-09 DIAGNOSIS — R2689 Other abnormalities of gait and mobility: Secondary | ICD-10-CM | POA: Diagnosis not present

## 2022-04-09 DIAGNOSIS — M25562 Pain in left knee: Secondary | ICD-10-CM | POA: Diagnosis not present
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
## 2022-04-10 ENCOUNTER — Other Ambulatory Visit: Payer: Self-pay | Admitting: Internal Medicine

## 2022-04-16 ENCOUNTER — Ambulatory Visit: Payer: BC Managed Care – PPO | Attending: Family Medicine | Admitting: Rehabilitative and Restorative Service Providers"

## 2022-04-16 ENCOUNTER — Encounter: Payer: Self-pay | Admitting: Rehabilitative and Restorative Service Providers"

## 2022-04-16 DIAGNOSIS — M25562 Pain in left knee: Secondary | ICD-10-CM | POA: Insufficient documentation

## 2022-04-16 DIAGNOSIS — G8929 Other chronic pain: Secondary | ICD-10-CM

## 2022-04-16 DIAGNOSIS — R2689 Other abnormalities of gait and mobility: Secondary | ICD-10-CM | POA: Diagnosis not present

## 2022-04-16 DIAGNOSIS — M6281 Muscle weakness (generalized): Secondary | ICD-10-CM | POA: Diagnosis not present

## 2022-04-16 DIAGNOSIS — G901 Familial dysautonomia [Riley-Day]: Secondary | ICD-10-CM | POA: Diagnosis not present
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
## 2022-04-17 ENCOUNTER — Encounter: Payer: Self-pay | Admitting: Internal Medicine

## 2022-04-17 ENCOUNTER — Ambulatory Visit (INDEPENDENT_AMBULATORY_CARE_PROVIDER_SITE_OTHER): Payer: BC Managed Care – PPO | Admitting: Internal Medicine

## 2022-04-17 VITALS — BP 128/72 | HR 85 | Ht 71.0 in | Wt 293.6 lb

## 2022-04-17 DIAGNOSIS — E66813 Obesity, class 3: Secondary | ICD-10-CM

## 2022-04-17 DIAGNOSIS — Z794 Long term (current) use of insulin: Secondary | ICD-10-CM | POA: Diagnosis not present

## 2022-04-17 DIAGNOSIS — E118 Type 2 diabetes mellitus with unspecified complications: Secondary | ICD-10-CM | POA: Diagnosis not present

## 2022-04-17 DIAGNOSIS — E785 Hyperlipidemia, unspecified: Secondary | ICD-10-CM | POA: Diagnosis not present

## 2022-04-17 DIAGNOSIS — Z6841 Body Mass Index (BMI) 40.0 and over, adult: Secondary | ICD-10-CM

## 2022-04-17 LAB — POCT GLYCOSYLATED HEMOGLOBIN (HGB A1C): Hemoglobin A1C: 7 % — AB (ref 4.0–5.6)

## 2022-04-17 MED ORDER — OZEMPIC (2 MG/DOSE) 8 MG/3ML ~~LOC~~ SOPN: 2.0000 mg | PEN_INJECTOR | SUBCUTANEOUS | 3 refills | Status: DC

## 2022-04-17 NOTE — Patient Instructions (Addendum)
Please continue: - Metformin XR 2000 mg at dinnertime - Jardiance 25 mg before b'fast  - [id number] 4-6 clicks per meal + use 8 clicks for larger meals  Try to change from Mounjaro: - Ozempic 2 mg weekly   Please return in 4 months.

## 2022-04-17 NOTE — Progress Notes (Signed)
Today number Subjective:     Patient ID: Troy [name], male   DOB: 1970-08-13, 52 y.o.   MRN: [id number]  HPI Mr. Goytia is a pleasant 52 y.o. man, returning for management of DM2, dx 2012, insulin-dependent, with complications (DR), uncontrolled.  Last visit 4 months ago.  Interim history: He continues to work with the weight management clinic.  He continues to have leg weakness. He was dx'ed with dysautonomia before  last OV - sees specialist in [place].  He is on a high salt diet, medication, and compression garment. He was started on Citalopram for presumed dx. Of andropause - feels much better - his irritability resolved.  He had chest pain since last visit-went to the emergency room.  He ruled out for an acute cardiac event.  Pain was considered to be due to pleurisy.  Reviewed HbA1c levels: Lab Results  Component Value Date   HGBA1C 7.4 (A) 12/12/2021   HGBA1C 6.9 (A) 08/08/2021   HGBA1C 7.9 (H) 03/23/2021   He is on: - Metformin XR 2000 mg in a.m. - Invokana 100 mg >> Jardiance 25 mg before breakfast - VGo 40 >> 30 >> 20 >>VGo30 (ran out of WLS93) with 4-6 clicks per meal, but 8 units before a larger meal - Ozempic 0.5 mg weekly-started 06/2017 >> 1 >> 2 mg weekly >> Mounjaro 12.5 mg weekly >> out of both "for weeks" due to lack of availability at the pharmacy  He was on Victoza before >> no SEs.   He checks his sugars more than 4 times a day with his CGM:  Previously:  Previously:   Lowest: 49 >> 60. Highest: 302 -  bolusing, [employer] >> 300s.  B'fast: meat or shake Lunch: meat + veggies + bread Dinner: meat + veggies  -No CKD: Lab Results  Component Value Date   BUN 14 01/31/2022   BUN 17 03/23/2021   CREATININE 0.82 01/31/2022   CREATININE 0.76 03/23/2021   No MAU: Lab Results  Component Value Date   MICRALBCREAT 1.0 01/31/2022   MICRALBCREAT 6 02/08/2020   MICRALBCREAT 0.5 12/26/2018   MICRALBCREAT 1.4 07/22/2017   MICRALBCREAT 2.2  12/18/2016   MICRALBCREAT 5.8 10/18/2015   MICRALBCREAT 2.0 03/16/2013   MICRALBCREAT 1.6 11/06/2012   MICRALBCREAT 2.5 07/31/2012   MICRALBCREAT 3.6 11/26/2011  He is not on ACE inhibitor/ARB.  -+ HL: Lab Results  Component Value Date   CHOL 148 01/31/2022   HDL 39.20 01/31/2022   LDLCALC 71 01/31/2022   LDLDIRECT 125.0 12/26/2018   TRIG 191.0 (H) 01/31/2022   CHOLHDL 4 01/31/2022  On Lipitor 20.  - Last eye exam was in 10/2021: + DR - improved.  He sees a retina specialist.  - No numbness and tingling in feet - sees podiatry.  Previously on gabapentin, then on Lyrica, then off.  Last foot exam 12/12/2021 here in clinic.  He has OSA and is compliant with his CPAP. He had a DVT in R leg in 04/2015.  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  He noticed that sugars were higher after he started Eliquis. He lost 45 lbs before 2017 - mostly vegan food.  He has a history of ADD. He did see urology before and was given Viagra, but causes for ED were not explored. We checked his testosterone level.  This returned mildly low: Component     Latest Ref Rng & Units 04/27/2019  Testosterone, Serum (Total)  ng/dL 262 (L)  % Free Testosterone     % 1.9  Free Testosterone, S     pg/mL 50 (L)  Sex Hormone Binding Globulin     nmol/L 34.5  After the above results, I referred him to the weight management clinic.  Reviewed B12 levels: Lab Results  Component Value Date   VITAMINB12 >2000 (H) 09/06/2020   VITAMINB12 237 04/27/2019   VITAMINB12 236 08/19/2017  Previously on B12 1000 mcg daily, then on 5000 mcg daily, but then came off.  After an episode of syncope, we checked him for adrenal insufficiency -normal cosyntropin stimulation test: Component     Latest Ref Rng & Units 11/24/2020 11/24/2020 11/24/2020         2:08 PM  2:39 PM  3:05 PM  Cortisol, Plasma     ug/dL 9.1 21.8 28.4   He had epidural steroid injections (02/2020 and 03/2020) and  then cervical decompression surgery (diskectomy, fusion) on 05/25/2020. He experienced vertigo in 09/2020, before a diagnosis of COVID-19.  He then had a syncopal episode (CBG 160).  He had milder episodes afterwards - but not recently.  No orthostasis.  We checked him for adrenal insufficiency and investigation was negative after last visit.  He developed leg weakness and had several falls.  He had extensive investigation >> negative. He was referred to cardiology in [place] - now has a diagnosis of dysautonomia (not POTS).  Review of Systems: + See HPI  + hand tremors/no numbness/no tingling/+ vertigo and dizziness  I reviewed pt's medications, allergies, PMH, social hx, family hx, and changes were documented in the history of present illness. Otherwise, unchanged from my initial visit note.  Past Medical History:  Diagnosis Date   Arm weakness    right    Asthma    B12 deficiency    Back pain    Complication of anesthesia    "as a teenager , as they were waken up, I was shaking all over- they called it convulsions -it has never happen again"   Diabetes insipidus ([place])    Diabetes mellitus without complication ([place])    TYPE II   DVT (deep venous thrombosis) ([name])    right leg last time 2020   Leg edema, right    Male infertility    Neck injury 12/22/2013   Neuropathy    Obesity    Obesity    OSA (obstructive sleep apnea)    CPAP   Past Surgical History:  Procedure Laterality Date   ANTERIOR CERVICAL DECOMP/DISCECTOMY FUSION N/A 05/25/2020   Procedure: ANTERIOR CERVICAL DECOMPRESSION/DISCECTOMY FUSION, INTERBODY PROSTHESIS, PLATE/SCREWS CERVICAL FIVE- CERVICAL SIX;  Surgeon: Newman Pies, MD;  Location: [facility];  Service: Neurosurgery;  Laterality: N/A;   BACK SURGERY     CERVICAL DISC SURGERY     C6/C7 2009   cubital tunnel left arm     2003   ELBOW SURGERY     FIBULA FRACTURE SURGERY     plate & pin removed due to infection 1997   ULNAR NERVE REPAIR     Social History    Socioeconomic History   Marital status: Married    Spouse name: Juliann Pulse   Number of children: 1   Years of education: Not on file   Highest education level: Bachelor's degree (e.g., BA, AB, BS)  Occupational History   Occupation: Customer Service Lead  Tobacco Use   Smoking status: Never   Smokeless tobacco: Never  Vaping Use   Vaping Use: Never used  Substance and Sexual Activity   Alcohol use: Yes    Comment: 1 - 2 a year   Drug use: No   Sexual activity: Yes  Other Topics Concern   Not on file  Social History Narrative   Occupation:  days [employer] 11- 8 am  Now changed job and shift to [employer] through Thursday    Working  Ft 40 hours mostly   Married '10 10 10   '$ Wife s/p bariatric surgery pt   Regular exercise- yes   Pt does have children   Father died suddenly 2008/05/22   Mom passes 2017  Acute rep disease after acute renal failure   Daily caffeine use one a day   2 dogs and cat.       Right Handed             Social Determinants of Health   Financial Resource Strain: Medium Risk (06/05/2021)   Overall Financial Resource Strain (CARDIA)    Difficulty of Paying Living Expenses: Somewhat hard  Food Insecurity: Food Insecurity Present (06/05/2021)   Hunger Vital Sign    Worried About Running Out of Food in the Last Year: Sometimes true    Ran Out of Food in the Last Year: Never true  Transportation Needs: No Transportation Needs (06/05/2021)   PRAPARE - Hydrologist (Medical): No    Lack of Transportation (Non-Medical): No  Physical Activity: Insufficiently Active (06/05/2021)   Exercise Vital Sign    Days of Exercise per Week: 3 days    Minutes of Exercise per Session: 40 min  Stress: No Stress Concern Present (06/05/2021)   [place]    Feeling of Stress : Only a little  Social Connections: Unknown (06/05/2021)   Social Connection and Isolation Panel  [NHANES]    Frequency of Communication with Friends and Family: Twice a week    Frequency of Social Gatherings with Friends and Family: Not on file    Attends Religious Services: Never    Marine scientist or Organizations: No    Attends Music therapist: Not on file    Marital Status: Married  Human resources officer Violence: Not on file   Current Outpatient Medications on File Prior to Visit  Medication Sig Dispense Refill   albuterol (PROVENTIL HFA;VENTOLIN HFA) 108 (90 Base) MCG/ACT inhaler Inhale 2 puffs into the lungs every 6 (six) hours as needed. For wheezing 1 Inhaler 2   apixaban (ELIQUIS) 5 MG TABS tablet TAKE 1 TABLET(5 MG) BY MOUTH TWICE DAILY 180 tablet 2   atorvastatin (LIPITOR) 40 MG tablet TAKE 1 TABLET DAILY 90 tablet 2   cholecalciferol (VITAMIN D3) 25 MCG (1000 UNIT) tablet Take 1,000 Units by mouth daily.     Continuous Blood Gluc Sensor (DEXCOM G7 SENSOR) MISC 3 each by Does not apply route every 30 (thirty) days. Apply 1 sensor every 10 days 9 each 4   escitalopram (LEXAPRO) 10 MG tablet Take 1 tablet (10 mg total) by mouth daily. 90 tablet 0   insulin aspart (NOVOLOG) 100 UNIT/ML injection Use 80 units a day in the insulin pump 90 mL 3   Insulin Disposable Pump (V-GO 30) 30 UNIT/24HR KIT Inject 1 each into the skin daily. 90 kit 3   JARDIANCE 25 MG TABS tablet TAKE 1 TABLET EVERY DAY BEFORE BREAKFAST 90 tablet 2   metFORMIN (GLUCOPHAGE-XR) 500 MG 24 hr tablet TAKE 4  TABLETS DAILY WITH BREAKFAST 360 tablet 3   nitroGLYCERIN (NITROSTAT) 0.4 MG SL tablet Place 1 tablet (0.4 mg total) under the tongue every 5 (five) minutes as needed for chest pain. 90 tablet 3   tirzepatide (MOUNJARO) 12.5 MG/0.5ML Pen Inject 12.5 mg into the skin once a week. 2 mL 0   No current facility-administered medications on file prior to visit.   No Known Allergies Family History  Problem Relation Age of Onset   Heart disease Mother    Other Mother        clotting disorders    Diabetes Mother    High blood pressure Mother    Kidney disease Mother    Depression Mother    Sleep apnea Mother    Obesity Mother    Other Father        sudden death/cervical and lumbar disk disease   Aneurysm Father        In his 84s smoked   Hyperlipidemia Father    High blood pressure Father    Sudden death Father    Alcoholism Father    Hypertension Other    Allergies Other    Deep vein thrombosis Other    Sleep apnea Other    Obesity Other     Objective:   Physical Exam BP 128/72 (BP Location: Left Arm, Patient Position: Sitting, Cuff Size: Normal)   Pulse 85   Ht '5\' 11"'$  (1.803 m)   Wt 293 lb 9.6 oz (133.2 kg)   SpO2 97%   BMI 40.95 kg/m   Wt Readings from Last 3 Encounters:  04/17/22 293 lb 9.6 oz (133.2 kg)  03/27/22 292 lb (132.5 kg)  03/20/22 289 lb (131.1 kg)   Constitutional: overweight, in NAD Eyes: EOMI, no exophthalmos ENT: no thyromegaly, no cervical lymphadenopathy Cardiovascular: RRR, No MRG, + RLE swelling -chronic, after his DVT  Respiratory: CTA B Musculoskeletal: no deformities Skin: no rashes Neurological: + Mild tremors with outstretched hands  Assessment:     1. DM2, insulin-dependent, uncontrolled, with complications - DR  2. Obesity class 3 BMI Classification: < 18.5 underweight  18.5-24.9 normal weight  25.0-29.9 overweight  30.0-34.9 class I obesity  35.0-39.9 class II obesity  ? 40.0 class III obesity   3. HL  Plan:     1. Patient with history of uncontrolled diabetes, on oral antidiabetic regimen with metformin and SGLT2 inhibitor and also weekly GLP-1 receptor agonist along with a V-Go patch pump at last visit, sugars are fluctuating in the upper limit of the target interval with occasional hyperglycemic spikes after lunch and only occasionally after dinner.  He was occasionally forgetting to bolus before lunch as he was busy at work.  He was working on [place] before every meal.  Therefore, we did not change his insulin  regiment, but tried to increase the Ozempic to 2 mg weekly.   CGM interpretation: -At today's visit, we reviewed his CGM downloads: It appears that 46% of values are in target range (goal >70%), while 54% are higher than 180 (goal <25%), and 0% are lower than 70 (goal <4%).  The calculated average blood sugar is 189.  The projected HbA1c for the next 3 months (GMI) is 7.8%. -Reviewing the CGM trends, sugars are higher than before, mostly above the upper limit of the target range.  He mentions that this is most likely due to the fact that he did not have Ozempic or Mounjaro 4 weeks due to lack of availability at the pharmacy.  He felt that Ozempic 2 mg weekly worked better for him than Mounjaro 12.5 mg weekly.  He would be interested to resume Ozempic.  We discussed about trying to obtain it from the mail order pharmacy, rather than the local pharmacy.  He agrees with this.  I sent a prescription for Ozempic to [facility] -Otherwise, we can continue the same regimen. - I advised him to: Patient Instructions  Please continue: - Metformin XR 2000 mg at dinnertime - Jardiance 25 mg before b'fast  - [id number] 4-6 clicks per meal + use 8 clicks for larger meals  Try to change from Mounjaro: - Ozempic 2 mg weekly   Please return in 4 months.  - we checked his HbA1c: 7.0% (lower than expected from his CGM) - advised to check sugars at different times of the day - 4x a day, rotating check times - advised for yearly eye exams >> he is UTD - return to clinic in 4 months  2. Obesity class 3 -continue SGLT 2 inhibitor and GLP-1 or GLP1/GIP receptor agonist which should also help with weight loss -He gained 9 pounds before last visit; he gained 1 pound since last visit -He is seen in the weight management clinic.  He recently gained weight which he attributes to retaining fluid due to cardiology advising him to increase his sodium intake to help with his dysautonomia.  3. HL -Reviewed latest lipid panel  from 01/2022: Triglycerides slightly high, LDL very slightly above goal: Lab Results  Component Value Date   CHOL 148 01/31/2022   HDL 39.20 01/31/2022   LDLCALC 71 01/31/2022   LDLDIRECT 125.0 12/26/2018   TRIG 191.0 (H) 01/31/2022   CHOLHDL 4 01/31/2022  -He continues on Lipitor 40 mg daily without side effects  Philemon Kingdom, MD PhD [facility]

## 2022-04-24 ENCOUNTER — Encounter: Payer: Self-pay | Admitting: Internal Medicine

## 2022-04-24 ENCOUNTER — Ambulatory Visit: Payer: BC Managed Care – PPO | Admitting: Internal Medicine

## 2022-04-24 VITALS — BP 101/69 | HR 90 | Ht 70.0 in | Wt 294.0 lb

## 2022-04-24 DIAGNOSIS — I10 Essential (primary) hypertension: Secondary | ICD-10-CM | POA: Diagnosis not present

## 2022-04-24 DIAGNOSIS — G4733 Obstructive sleep apnea (adult) (pediatric): Secondary | ICD-10-CM | POA: Diagnosis not present

## 2022-04-24 DIAGNOSIS — E782 Mixed hyperlipidemia: Secondary | ICD-10-CM

## 2022-04-24 NOTE — Progress Notes (Signed)
Primary Physician/Referring:  Burnis Medin, MD  Patient ID: Troy [name], male    DOB: 09-Sep-1970, 52 y.o.   MRN: [id number]  Chief Complaint  Patient presents with   Chest Pain   Follow-up   Results   HPI:    Troy [name]  is a 52 y.o. male with past medical history significant for hypertension, hyperlipidemia, and diabetes who is here to establish care with cardiology.  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  Since then he has been on Eliquis and he will remain on it forever.  Patient has been doing well since our last visit. He is wearing his CPAP but the settings have not been adjusted in many years. Patient denies palpitations, diaphoresis, syncope, edema, orthopnea, PND, claudication.  Past Medical History:  Diagnosis Date   Arm weakness    right    Asthma    B12 deficiency    Back pain    Complication of anesthesia    "as a teenager , as they were waken up, I was shaking all over- they called it convulsions -it has never happen again"   Diabetes insipidus ([place])    Diabetes mellitus without complication ([place])    TYPE II   DVT (deep venous thrombosis) ([name])    right leg last time 2020   Leg edema, right    Male infertility    Neck injury 12/22/2013   Neuropathy    Obesity    Obesity    OSA (obstructive sleep apnea)    CPAP   Past Surgical History:  Procedure Laterality Date   ANTERIOR CERVICAL DECOMP/DISCECTOMY FUSION N/A 05/25/2020   Procedure: ANTERIOR CERVICAL DECOMPRESSION/DISCECTOMY FUSION, INTERBODY PROSTHESIS, PLATE/SCREWS CERVICAL FIVE- CERVICAL SIX;  Surgeon: Newman Pies, MD;  Location: [facility];  Service: Neurosurgery;  Laterality: N/A;   BACK SURGERY     CERVICAL DISC SURGERY     C6/C7 2009   cubital tunnel left arm     2003   ELBOW SURGERY     FIBULA FRACTURE SURGERY     plate & pin removed due to infection [place]     Family History  Problem Relation Age  of Onset   Heart disease Mother    Other Mother        clotting disorders   Diabetes Mother    High blood pressure Mother    Kidney disease Mother    Depression Mother    Sleep apnea Mother    Obesity Mother    Other Father        sudden death/cervical and lumbar disk disease   Aneurysm Father        In his 3s smoked   Hyperlipidemia Father    High blood pressure Father    Sudden death Father    Alcoholism Father    Hypertension Other    Allergies Other    Deep vein thrombosis Other    Sleep apnea Other    Obesity Other     Social History   Tobacco Use   Smoking status: Never   Smokeless tobacco: Never  Substance Use Topics   Alcohol use: Yes    Comment: 1 - 2 a year   Marital Status: Married  ROS  Review of Systems  Cardiovascular:  Negative for chest pain and dyspnea on exertion.  Respiratory:  Positive for shortness of breath.    Objective  Blood pressure 101/69, pulse 90, height 5' 10"$  (1.778 m), weight 294 lb (133.4 kg), SpO2 97 %. Body mass index is 42.18 kg/m.     04/24/2022   10:37 AM 04/17/2022    8:31 AM 03/27/2022    7:34 AM  Vitals with BMI  Height 5' 10"$  5' 11"$  5' 11"$   Weight 294 lbs 293 lbs 10 oz 292 lbs  BMI 42.18 [id number]  Systolic [id number]  Diastolic 69 72 82  Pulse 90 85 85     Physical Exam Vitals reviewed.  HENT:     Head: Normocephalic and atraumatic.  Cardiovascular:     Rate and Rhythm: Normal rate and regular rhythm.     Pulses: Normal pulses.     Heart sounds: Normal heart sounds. No murmur heard. Pulmonary:     Effort: Pulmonary effort is normal.     Breath sounds: Normal breath sounds.  Abdominal:     General: Bowel sounds are normal.  Musculoskeletal:     Right lower leg: No edema.     Left lower leg: No edema.  Skin:    General: Skin is warm and dry.  Neurological:     Mental Status: He is alert.     Medications and allergies  No Known Allergies   Medication list after today's encounter   Current  Outpatient Medications:    albuterol (PROVENTIL HFA;VENTOLIN HFA) 108 (90 Base) MCG/ACT inhaler, Inhale 2 puffs into the lungs every 6 (six) hours as needed. For wheezing, Disp: 1 Inhaler, Rfl: 2   apixaban (ELIQUIS) 5 MG TABS tablet, TAKE 1 TABLET(5 MG) BY MOUTH TWICE DAILY, Disp: 180 tablet, Rfl: 2   atorvastatin (LIPITOR) 40 MG tablet, TAKE 1 TABLET DAILY, Disp: 90 tablet, Rfl: 2   cholecalciferol (VITAMIN D3) 25 MCG (1000 UNIT) tablet, Take 1,000 Units by mouth daily., Disp: , Rfl:    Continuous Blood Gluc Sensor (DEXCOM G7 SENSOR) MISC, 3 each by Does not apply route every 30 (thirty) days. Apply 1 sensor every 10 days, Disp: 9 each, Rfl: 4   escitalopram (LEXAPRO) 10 MG tablet, Take 1 tablet (10 mg total) by mouth daily., Disp: 90 tablet, Rfl: 0   insulin aspart (NOVOLOG) 100 UNIT/ML injection, Use 80 units a day in the insulin pump, Disp: 90 mL, Rfl: 3   Insulin Disposable Pump (V-GO 30) 30 UNIT/24HR KIT, Inject 1 each into the skin daily., Disp: 90 kit, Rfl: 3   JARDIANCE 25 MG TABS tablet, TAKE 1 TABLET EVERY DAY BEFORE BREAKFAST, Disp: 90 tablet, Rfl: 2   metFORMIN (GLUCOPHAGE-XR) 500 MG 24 hr tablet, TAKE 4 TABLETS DAILY WITH BREAKFAST, Disp: 360 tablet, Rfl: 3   midodrine (PROAMATINE) 2.5 MG tablet, Take 2.5 mg by mouth 3 (three) times daily with meals., Disp: , Rfl:    nitroGLYCERIN (NITROSTAT) 0.4 MG SL tablet, Place 1 tablet (0.4 mg total) under the tongue every 5 (five) minutes as needed for chest pain., Disp: 90 tablet, Rfl: 3   Semaglutide, 2 MG/DOSE, (OZEMPIC, 2 MG/DOSE,) 8 MG/3ML SOPN, Inject 2 mg into the skin once a week., Disp: 9 mL, Rfl: 3  Laboratory examination:   Lab Results  Component Value Date   NA 137 01/31/2022   K 4.4 01/31/2022   CO2 27 01/31/2022   GLUCOSE 163 (H) 01/31/2022   BUN 14 01/31/2022   CREATININE 0.82 01/31/2022   CALCIUM 9.0 01/31/2022   EGFR 109 03/23/2021   GFRNONAA >60 10/31/2020       Latest  Ref Rng & Units 01/31/2022   10:42 AM  03/23/2021    8:02 AM 10/31/2020    3:06 PM  CMP  Glucose 70 - 99 mg/dL 163  115  151   BUN 6 - 23 mg/dL 14  17  19   $ Creatinine 0.40 - 1.50 mg/dL 0.82  0.76  0.94   Sodium 135 - 145 mEq/L 137  141  138   Potassium 3.5 - 5.1 mEq/L 4.4  4.4  4.1   Chloride 96 - 112 mEq/L 101  105  99   CO2 19 - 32 mEq/L 27  24  27   $ Calcium 8.4 - 10.5 mg/dL 9.0  9.4  9.4   Total Protein 6.0 - 8.3 g/dL 6.9  6.8    Total Bilirubin 0.2 - 1.2 mg/dL 0.8  0.7    Alkaline Phos 39 - 117 U/L 59  70    AST 0 - 37 U/L 15  15    ALT 0 - 53 U/L 23  23        Latest Ref Rng & Units 01/31/2022   10:42 AM 10/31/2020    3:06 PM 05/25/2020   12:40 PM  CBC  WBC 4.0 - 10.5 K/uL 8.6  9.6  10.3   Hemoglobin 13.0 - 17.0 g/dL 16.7  16.3  16.5   Hematocrit 39.0 - 52.0 % 49.1  47.1  48.3   Platelets 150.0 - 400.0 K/uL 239.0  248  208     Lipid Panel Recent Labs    01/31/22 1042  CHOL 148  TRIG 191.0*  LDLCALC 71  VLDL 38.2  HDL 39.20  CHOLHDL 4    HEMOGLOBIN A1C Lab Results  Component Value Date   HGBA1C 7.0 (A) 04/17/2022   MPG 145.59 05/25/2020   TSH Recent Labs    01/31/22 1042  TSH 0.82    External labs:     Radiology:    Cardiac Studies:   Exercise nuclear stress test 03/19/2022: Myocardial perfusion is normal. Overall LV systolic function is normal without regional wall motion abnormalities. Stress LV EF: 49%, however, visually appears to be normal.  Normal ECG stress. The patient exercised for 6 minutes and 3 seconds of a Bruce protocol, achieving approximately 7.12 MET & 87% MPHR. No chest pain. Normal BP response.  No previous exam available for comparison. Low risk.    Echocardiogram 03/20/2022: Left ventricle cavity is normal in size and wall thickness. Normal global wall motion. Normal LV systolic function with EF 59%. Normal diastolic filling pattern. No significant valvular abnormality. Normal right atrial pressure.     EKG:   02/21/2022: normal sinus, normal R wave  progression, no evidence of ischemia  Assessment     ICD-10-CM   1. OSA (obstructive sleep apnea)  G47.33 Ambulatory referral to Sleep Studies    2. Essential hypertension  I10     3. Mixed hyperlipidemia  E78.2        Orders Placed This Encounter  Procedures   Ambulatory referral to Sleep Studies    Referral Priority:   Routine    Referral Type:   Consultation    Referral Reason:   Specialty Services Required    Referred to Provider:   Dohmeier, Asencion Partridge, MD    Number of Visits Requested:   1    No orders of the defined types were placed in this encounter.   Medications Discontinued During This Encounter  Medication Reason   tirzepatide (MOUNJARO) 12.5 MG/0.5ML Pen Change in  therapy     Recommendations:   KAMEN HOSTETLER is a 52 y.o.  male with HTN, HLD, and DM2 presenting with chest pain   Essential hypertension Will initiate anti-hypertensive next visit if BP still high Patient to keep BP log until next visit Encourage low-sodium diet, less than 2000 mg daily. Follow-up in 2 months or sooner if needed.   Mixed hyperlipidemia Continue lipitor 54m daily   OSA Complaint with CPAP but settings have not been adjusted in many years     SFloydene Flock DO, [facility] 04/30/2022, 5:48 PM Office: [phone]: [phone]

## 2022-05-01 ENCOUNTER — Encounter (INDEPENDENT_AMBULATORY_CARE_PROVIDER_SITE_OTHER): Payer: Self-pay | Admitting: Family Medicine

## 2022-05-01 ENCOUNTER — Ambulatory Visit (INDEPENDENT_AMBULATORY_CARE_PROVIDER_SITE_OTHER): Payer: BC Managed Care – PPO | Admitting: Family Medicine

## 2022-05-01 VITALS — BP 117/64 | HR 80 | Temp 97.8°F | Ht 70.0 in | Wt 291.0 lb

## 2022-05-01 DIAGNOSIS — E118 Type 2 diabetes mellitus with unspecified complications: Secondary | ICD-10-CM | POA: Diagnosis not present

## 2022-05-01 DIAGNOSIS — Z7985 Long-term (current) use of injectable non-insulin antidiabetic drugs: Secondary | ICD-10-CM

## 2022-05-01 DIAGNOSIS — F3289 Other specified depressive episodes: Secondary | ICD-10-CM | POA: Diagnosis not present

## 2022-05-01 DIAGNOSIS — Z7984 Long term (current) use of oral hypoglycemic drugs: Secondary | ICD-10-CM

## 2022-05-01 DIAGNOSIS — Z6841 Body Mass Index (BMI) 40.0 and over, adult: Secondary | ICD-10-CM | POA: Diagnosis not present

## 2022-05-01 DIAGNOSIS — Z794 Long term (current) use of insulin: Secondary | ICD-10-CM

## 2022-05-01 DIAGNOSIS — E669 Obesity, unspecified: Secondary | ICD-10-CM

## 2022-05-01 MED ORDER — ESCITALOPRAM OXALATE 20 MG PO TABS: 20.0000 mg | ORAL_TABLET | Freq: Every day | ORAL | 0 refills | Status: DC

## 2022-05-14 NOTE — Progress Notes (Unsigned)
Chief Complaint:   OBESITY Troy [name] is here to discuss his progress with his obesity treatment plan along with follow-up of his obesity related diagnoses. Troy [name] is on the Category 3 Plan and states he is following his eating plan approximately 60% of the time. Troy [name] states he is doing physical therapy for 40 minutes 2-3 times per week.  Today's visit was #: 35 Starting weight: 293 lbs Starting date: 09/07/2019 Today's weight: 291 lbs Today's date: 05/01/2022 Total lbs lost to date: 2 Total lbs lost since last in-office visit: 0  Interim History: Troy [name] has been off his routine since our last visit.  He has tried to eat healthier but meal planning has been especially difficult.  He has been helping a friend clean and organize his garage, and that has increased his physical activity.  Subjective:   1. Type 2 diabetes mellitus with complication, with long-term current use of insulin ([place]) Troy [name] is on [place] and Ozempic.  His last A1c was 7.0, and he has had challenges with getting his medications with recent drug shortages.  2. Emotional Eating Behavior Troy [name] started Lexapro 10 mg and he feels it is helping him with anger issues.  He continues to work on decreasing emotional eating behaviors, and no side effects were noted.  Assessment/Plan:   1. Type 2 diabetes mellitus with complication, with long-term current use of insulin ([name]) Troy [name] is to continue his medications, and work on following his eating plan.  2. Emotional Eating Behavior Troy [name] agreed to increase Lexapro to 20 mg once daily, and we will refill for 1 month.  - escitalopram (LEXAPRO) 20 MG tablet; Take 1 tablet (20 mg total) by mouth daily.  Dispense: 30 tablet; Refill: 0  3. BMI 40.0-44.9, adult ([place])  4. Obesity, Beginning BMI 40.87 Troy [name] is currently in the action stage of change. As such, his goal is to continue with weight loss efforts. He has agreed to the Category 3 Plan.   Exercise  goals: As is.   Behavioral modification strategies: increasing lean protein intake.  Troy [name] has agreed to follow-up with our clinic in 4 weeks. He was informed of the importance of frequent follow-up visits to maximize his success with intensive lifestyle modifications for his multiple health conditions.   Objective:   Blood pressure 117/64, pulse 80, temperature 97.8 F (36.6 C), height '5\' 10"'$  (1.778 m), weight 291 lb (132 kg), SpO2 97 %. Body mass index is 41.75 kg/m.  Lab Results  Component Value Date   CREATININE 0.82 01/31/2022   BUN 14 01/31/2022   NA 137 01/31/2022   K 4.4 01/31/2022   CL 101 01/31/2022   CO2 27 01/31/2022   Lab Results  Component Value Date   ALT 23 01/31/2022   AST 15 01/31/2022   ALKPHOS 59 01/31/2022   BILITOT 0.8 01/31/2022   Lab Results  Component Value Date   HGBA1C 7.0 (A) 04/17/2022   HGBA1C 7.4 (A) 12/12/2021   HGBA1C 6.9 (A) 08/08/2021   HGBA1C 7.9 (H) 03/23/2021   HGBA1C 6.7 (A) 11/18/2020   Lab Results  Component Value Date   INSULIN 7.9 09/06/2020   Lab Results  Component Value Date   TSH 0.82 01/31/2022   Lab Results  Component Value Date   CHOL 148 01/31/2022   HDL 39.20 01/31/2022   LDLCALC 71 01/31/2022   LDLDIRECT 125.0 12/26/2018   TRIG 191.0 (H) 01/31/2022   CHOLHDL 4 01/31/2022   Lab Results  Component Value Date   VD25OH  70.2 03/23/2021   VD25OH 55.3 09/06/2020   VD25OH 63.0 06/13/2020   Lab Results  Component Value Date   WBC 8.6 01/31/2022   HGB 16.7 01/31/2022   HCT 49.1 01/31/2022   MCV 97.5 01/31/2022   PLT 239.0 01/31/2022   Lab Results  Component Value Date   IRON 144 02/08/2020   TIBC 358 02/08/2020   FERRITIN 96 02/08/2020   Attestation Statements:   Reviewed by clinician on day of visit: allergies, medications, problem list, medical history, surgical history, family history, social history, and previous encounter notes.   I, Trixie Dredge, am acting as transcriptionist for Dennard Nip, MD.  I have reviewed the above documentation for accuracy and completeness, and I agree with the above. -  Dennard Nip, MD

## 2022-05-25 ENCOUNTER — Other Ambulatory Visit: Payer: Self-pay | Admitting: Internal Medicine

## 2022-05-29 ENCOUNTER — Other Ambulatory Visit (INDEPENDENT_AMBULATORY_CARE_PROVIDER_SITE_OTHER): Payer: Self-pay | Admitting: Family Medicine

## 2022-05-29 DIAGNOSIS — F3289 Other specified depressive episodes: Secondary | ICD-10-CM

## 2022-05-30 ENCOUNTER — Ambulatory Visit (INDEPENDENT_AMBULATORY_CARE_PROVIDER_SITE_OTHER): Payer: BC Managed Care – PPO | Admitting: Family Medicine

## 2022-05-30 ENCOUNTER — Encounter (INDEPENDENT_AMBULATORY_CARE_PROVIDER_SITE_OTHER): Payer: Self-pay | Admitting: Family Medicine

## 2022-05-30 VITALS — BP 108/70 | HR 77 | Temp 97.4°F | Ht 70.0 in | Wt 290.0 lb

## 2022-05-30 DIAGNOSIS — E669 Obesity, unspecified: Secondary | ICD-10-CM | POA: Diagnosis not present

## 2022-05-30 DIAGNOSIS — E118 Type 2 diabetes mellitus with unspecified complications: Secondary | ICD-10-CM | POA: Diagnosis not present

## 2022-05-30 DIAGNOSIS — F3289 Other specified depressive episodes: Secondary | ICD-10-CM

## 2022-05-30 DIAGNOSIS — Z6841 Body Mass Index (BMI) 40.0 and over, adult: Secondary | ICD-10-CM | POA: Diagnosis not present

## 2022-05-30 DIAGNOSIS — Z7985 Long-term (current) use of injectable non-insulin antidiabetic drugs: Secondary | ICD-10-CM

## 2022-05-30 DIAGNOSIS — Z794 Long term (current) use of insulin: Secondary | ICD-10-CM

## 2022-05-30 MED ORDER — ESCITALOPRAM OXALATE 20 MG PO TABS: 20.0000 mg | ORAL_TABLET | Freq: Every day | ORAL | 0 refills | Status: DC

## 2022-06-01 ENCOUNTER — Other Ambulatory Visit (INDEPENDENT_AMBULATORY_CARE_PROVIDER_SITE_OTHER): Payer: Self-pay | Admitting: Family Medicine

## 2022-06-01 DIAGNOSIS — Z794 Long term (current) use of insulin: Secondary | ICD-10-CM

## 2022-06-04 NOTE — Progress Notes (Unsigned)
Chief Complaint:   OBESITY Troy [name] is here to discuss his progress with his obesity treatment plan along with follow-up of his obesity related diagnoses. Troy [name] is on the Category 3 Plan and states he is following his eating plan approximately 90% of the time. Troy [name] states he is doing 0 minutes 0 times per week.  Today's visit was #: 28 Starting weight: 293 lbs Starting date: 09/07/2019 Today's weight: 290 lbs Today's date: 05/30/2022 Total lbs lost to date: 3 Total lbs lost since last in-office visit: 1  Interim History: Troy [name] used to follow his category 3 plan and he has lost another pound.  He is active at times but he has to be careful with his orthostatic hypotension.  Subjective:   1. Type 2 diabetes mellitus with complication, with long-term current use of insulin [facility]) Troy [name] is followed by Dr. Cruzita Lederer.  He is working on his diet and weight loss.  He denies hypoglycemia.  2. Emotional Eating Behavior Troy [name] feels his Lexapro is helping his mood.  He notes decreased in irritability and decrease emotional eating behavior.  Assessment/Plan:   1. Type 2 diabetes mellitus with complication, with long-term current use of insulin ([place]) Troy [name] will continue Ozempic and his diet, and he will watch for hypoglycemia and we will continue to monitor his progress.  2. Emotional Eating Behavior Troy [name] will continue Lexapro, and we will refill for 90 days.  - escitalopram (LEXAPRO) 20 MG tablet; Take 1 tablet (20 mg total) by mouth daily.  Dispense: 90 tablet; Refill: 0  3. BMI 40.0-44.9, adult (Troy [name])  4. Obesity, Beginning BMI 40.87 Troy [name] is currently in the action stage of change. As such, his goal is to continue with weight loss efforts. He has agreed to the Category 3 Plan.   Exercise goals: All adults should avoid inactivity. Some physical activity is better than none, and adults who participate in any amount of physical activity gain some health  benefits.  Behavioral modification strategies: increasing lean protein intake.  Ferry has agreed to follow-up with our clinic in 4 weeks. He was informed of the importance of frequent follow-up visits to maximize his success with intensive lifestyle modifications for his multiple health conditions.   Objective:   Blood pressure 108/70, pulse 77, temperature (!) 97.4 F (36.3 C), height 5\' 10"  (1.778 m), weight 290 lb (131.5 kg), SpO2 97 %. Body mass index is 41.61 kg/m.  Lab Results  Component Value Date   CREATININE 0.82 01/31/2022   BUN 14 01/31/2022   NA 137 01/31/2022   K 4.4 01/31/2022   CL 101 01/31/2022   CO2 27 01/31/2022   Lab Results  Component Value Date   ALT 23 01/31/2022   AST 15 01/31/2022   ALKPHOS 59 01/31/2022   BILITOT 0.8 01/31/2022   Lab Results  Component Value Date   HGBA1C 7.0 (A) 04/17/2022   HGBA1C 7.4 (A) 12/12/2021   HGBA1C 6.9 (A) 08/08/2021   HGBA1C 7.9 (H) 03/23/2021   HGBA1C 6.7 (A) 11/18/2020   Lab Results  Component Value Date   INSULIN 7.9 09/06/2020   Lab Results  Component Value Date   TSH 0.82 01/31/2022   Lab Results  Component Value Date   CHOL 148 01/31/2022   HDL 39.20 01/31/2022   LDLCALC 71 01/31/2022   LDLDIRECT 125.0 12/26/2018   TRIG 191.0 (H) 01/31/2022   CHOLHDL 4 01/31/2022   Lab Results  Component Value Date   VD25OH 70.2 03/23/2021   VD25OH 55.3 09/06/2020  VD25OH 63.0 06/13/2020   Lab Results  Component Value Date   WBC 8.6 01/31/2022   HGB 16.7 01/31/2022   HCT 49.1 01/31/2022   MCV 97.5 01/31/2022   PLT 239.0 01/31/2022   Lab Results  Component Value Date   IRON 144 02/08/2020   TIBC 358 02/08/2020   FERRITIN 96 02/08/2020   Attestation Statements:   Reviewed by clinician on day of visit: allergies, medications, problem list, medical history, surgical history, family history, social history, and previous encounter notes.  I have personally spent 40 minutes total time today in  preparation, patient care, and documentation for this visit, including the following: review of clinical lab tests; review of medical tests/procedures/services.   I, Trixie Dredge, am acting as transcriptionist for Dennard Nip, MD.  I have reviewed the above documentation for accuracy and completeness, and I agree with the above. -  Dennard Nip, MD

## 2022-06-10 ENCOUNTER — Other Ambulatory Visit (INDEPENDENT_AMBULATORY_CARE_PROVIDER_SITE_OTHER): Payer: Self-pay | Admitting: Family Medicine

## 2022-06-10 DIAGNOSIS — F3289 Other specified depressive episodes: Secondary | ICD-10-CM

## 2022-06-28 ENCOUNTER — Ambulatory Visit (INDEPENDENT_AMBULATORY_CARE_PROVIDER_SITE_OTHER): Payer: BC Managed Care – PPO | Admitting: Family Medicine

## 2022-06-28 ENCOUNTER — Encounter (INDEPENDENT_AMBULATORY_CARE_PROVIDER_SITE_OTHER): Payer: Self-pay | Admitting: Family Medicine

## 2022-06-28 VITALS — BP 102/65 | HR 88 | Temp 97.7°F | Ht 70.0 in | Wt 288.0 lb

## 2022-06-28 DIAGNOSIS — E118 Type 2 diabetes mellitus with unspecified complications: Secondary | ICD-10-CM | POA: Diagnosis not present

## 2022-06-28 DIAGNOSIS — E669 Obesity, unspecified: Secondary | ICD-10-CM

## 2022-06-28 DIAGNOSIS — Z7985 Long-term (current) use of injectable non-insulin antidiabetic drugs: Secondary | ICD-10-CM

## 2022-06-28 DIAGNOSIS — Z6841 Body Mass Index (BMI) 40.0 and over, adult: Secondary | ICD-10-CM | POA: Diagnosis not present

## 2022-06-28 DIAGNOSIS — F3289 Other specified depressive episodes: Secondary | ICD-10-CM

## 2022-06-28 DIAGNOSIS — Z794 Long term (current) use of insulin: Secondary | ICD-10-CM

## 2022-07-03 NOTE — Progress Notes (Signed)
Chief Complaint:   OBESITY Troy [name] is here to discuss his progress with his obesity treatment plan along with follow-up of his obesity related diagnoses. Troy [name] is on the Category 3 Plan and states he is following his eating plan approximately 95% of the time. Troy [name] states he is doing 0 minutes 0 times per week.  Today's visit was #: 35 Starting weight: 293 lbs Starting date: 09/07/2019 Today's weight: 288 lbs Today's date: 06/28/2022 Total lbs lost to date: 5 Total lbs lost since last in-office visit: 2  Interim History: Troy [name] continues to work on his diet and increasing his protein. He is still having balance issues and falls, and exercise is limited.   Subjective:   1. Type 2 diabetes mellitus with complication, with long-term current use of insulin Troy [name]'s glucose ranges between 150-180 is followed by Dr. Elvera Lennox. He is doing well with Ozempic with no nausea or hypoglycemia.   2. Emotional Eating Behavior Troy [name] is stable on Lexapro, and his mood are appropriate and he continues to work on decreasing emotional eating behaviors. No side effects were noted.   Assessment/Plan:   1. Type 2 diabetes mellitus with complication, with long-term current use of insulin Troy [name] is to continue to work on increasing protein and decrease simple carbohydrates, and we will continue to follow and monitor his progress.   2. Emotional Eating Behavior Troy [name] will continue his Lexapro 20 mg daily, and we will continue to follow.    3. BMI 40.0-44.9, adult  4. Obesity, Beginning BMI 40.87 Troy [name] is currently in the action stage of change. As such, his goal is to continue with weight loss efforts. He has agreed to the Category 3 Plan.   Exercise goals: Core strengthening exercises were discussed.   Behavioral modification strategies: increasing lean protein intake and meal planning and cooking strategies.  Troy [name] has agreed to follow-up with our clinic in 5 to 6 weeks. He was  informed of the importance of frequent follow-up visits to maximize his success with intensive lifestyle modifications for his multiple health conditions.   Objective:   Blood pressure 102/65, pulse 88, temperature 97.7 F (36.5 C), height  (1.778 m), weight 288 lb (130.6 kg), SpO2 95 %. Body mass index is 41.32 kg/m.  Lab Results  Component Value Date   CREATININE 0.82 01/31/2022   BUN 14 01/31/2022   NA 137 01/31/2022   K 4.4 01/31/2022   CL 101 01/31/2022   CO2 27 01/31/2022   Lab Results  Component Value Date   ALT 23 01/31/2022   AST 15 01/31/2022   ALKPHOS 59 01/31/2022   BILITOT 0.8 01/31/2022   Lab Results  Component Value Date   HGBA1C 7.0 (A) 04/17/2022   HGBA1C 7.4 (A) 12/12/2021   HGBA1C 6.9 (A) 08/08/2021   HGBA1C 7.9 (H) 03/23/2021   HGBA1C 6.7 (A) 11/18/2020   Lab Results  Component Value Date   INSULIN 7.9 09/06/2020   Lab Results  Component Value Date   TSH 0.82 01/31/2022   Lab Results  Component Value Date   CHOL 148 01/31/2022   HDL 39.20 01/31/2022   LDLCALC 71 01/31/2022   LDLDIRECT 125.0 12/26/2018   TRIG 191.0 (H) 01/31/2022   CHOLHDL 4 01/31/2022   Lab Results  Component Value Date   VD25OH 70.2 03/23/2021   VD25OH 55.3 09/06/2020   VD25OH 63.0 06/13/2020   Lab Results  Component Value Date   WBC 8.6 01/31/2022   HGB 16.7 01/31/2022   HCT 49.1  01/31/2022   MCV 97.5 01/31/2022   PLT 239.0 01/31/2022   Lab Results  Component Value Date   IRON 144 02/08/2020   TIBC 358 02/08/2020   FERRITIN 96 02/08/2020   Attestation Statements:   Reviewed by clinician on day of visit: allergies, medications, problem list, medical history, surgical history, family history, social history, and previous encounter notes.  Time spent on visit including pre-visit chart review and post-visit care and charting was 30 minutes.   I, Burt Knack, am acting as transcriptionist for Quillian Quince, MD.  I have reviewed the above  documentation for accuracy and completeness, and I agree with the above. -  Quillian Quince, MD

## 2022-07-16 DIAGNOSIS — G901 Familial dysautonomia [Riley-Day]: Secondary | ICD-10-CM | POA: Insufficient documentation

## 2022-07-16 HISTORY — DX: Familial dysautonomia (riley-day): G90.1

## 2022-07-26 ENCOUNTER — Ambulatory Visit (INDEPENDENT_AMBULATORY_CARE_PROVIDER_SITE_OTHER): Payer: BC Managed Care – PPO | Admitting: Family Medicine

## 2022-08-01 ENCOUNTER — Other Ambulatory Visit: Payer: Self-pay | Admitting: Internal Medicine

## 2022-08-07 ENCOUNTER — Other Ambulatory Visit (INDEPENDENT_AMBULATORY_CARE_PROVIDER_SITE_OTHER): Payer: Self-pay | Admitting: Family Medicine

## 2022-08-07 DIAGNOSIS — Z794 Long term (current) use of insulin: Secondary | ICD-10-CM

## 2022-08-08 ENCOUNTER — Ambulatory Visit (INDEPENDENT_AMBULATORY_CARE_PROVIDER_SITE_OTHER): Payer: BC Managed Care – PPO | Admitting: Family Medicine

## 2022-08-08 ENCOUNTER — Encounter (INDEPENDENT_AMBULATORY_CARE_PROVIDER_SITE_OTHER): Payer: Self-pay | Admitting: Family Medicine

## 2022-08-08 VITALS — BP 99/66 | HR 85 | Temp 97.9°F | Ht 70.0 in | Wt 291.0 lb

## 2022-08-08 DIAGNOSIS — E669 Obesity, unspecified: Secondary | ICD-10-CM

## 2022-08-08 DIAGNOSIS — F3289 Other specified depressive episodes: Secondary | ICD-10-CM | POA: Diagnosis not present

## 2022-08-08 DIAGNOSIS — E559 Vitamin D deficiency, unspecified: Secondary | ICD-10-CM

## 2022-08-08 DIAGNOSIS — Z6841 Body Mass Index (BMI) 40.0 and over, adult: Secondary | ICD-10-CM

## 2022-08-08 MED ORDER — ESCITALOPRAM OXALATE 20 MG PO TABS: 20.0000 mg | ORAL_TABLET | Freq: Every day | ORAL | 0 refills | Status: DC

## 2022-08-13 NOTE — Progress Notes (Unsigned)
Chief Complaint:   OBESITY Troy [name] is here to discuss his progress with his obesity treatment plan along with follow-up of his obesity related diagnoses. Demtrius is on the Category 3 Plan and states he is following his eating plan approximately 70% of the time. [place] states he is walking for 15 minutes 3 times per week.  Today's visit was #: 36 Starting weight: 293 lbs Starting date: 09/07/2019 Today's weight: 291 lbs Today's date: 08/08/2022 Total lbs lost to date: 2 Total lbs lost since last in-office visit: 0  Interim History: Patient is some celebration eating while on a cruise since his last visit.  He was mindful of his choices and he tried to eat healthier.  He is ready to get back on track with his category 3 plan.  Subjective:   1. Vitamin D deficiency Patient is on OTC vitamin D 1000 units daily, and he is due to have his labs rechecked in 1 to 2 months.  2. Emotional Eating Behavior Patient is doing well on Lexapro.  He continues to work on decreasing emotional eating behavior.  No side effects were noted.  Assessment/Plan:   1. Vitamin D deficiency Patient will continue OTC vitamin D, and we will continue to follow.  2. Emotional Eating Behavior Patient will continue Lexapro 20 mg, and we will refill for 90 days.  - escitalopram (LEXAPRO) 20 MG tablet; Take 1 tablet (20 mg total) by mouth daily.  Dispense: 90 tablet; Refill: 0  3. BMI 40.0-44.9, adult ([name])  4. Obesity, Beginning BMI 40.87 Shahzeb is currently in the action stage of change. As such, his goal is to continue with weight loss efforts. He has agreed to the Category 3 Plan.   Exercise goals: For substantial health benefits, adults should do at least 150 minutes (2 hours and 30 minutes) a week of moderate-intensity, or 75 minutes (1 hour and 15 minutes) a week of vigorous-intensity aerobic physical activity, or an equivalent combination of moderate- and vigorous-intensity aerobic activity. Aerobic  activity should be performed in episodes of at least 10 minutes, and preferably, it should be spread throughout the week.  Behavioral modification strategies: increasing lean protein intake and meal planning and cooking strategies.  Theresa has agreed to follow-up with our clinic in 4 weeks. He was informed of the importance of frequent follow-up visits to maximize his success with intensive lifestyle modifications for his multiple health conditions.   Objective:   Blood pressure 99/66, pulse 85, temperature 97.9 F (36.6 C), height 5\' 10"  (1.778 m), weight 291 lb (132 kg), SpO2 98 %. Body mass index is 41.75 kg/m.  Lab Results  Component Value Date   CREATININE 0.82 01/31/2022   BUN 14 01/31/2022   NA 137 01/31/2022   K 4.4 01/31/2022   CL 101 01/31/2022   CO2 27 01/31/2022   Lab Results  Component Value Date   ALT 23 01/31/2022   AST 15 01/31/2022   ALKPHOS 59 01/31/2022   BILITOT 0.8 01/31/2022   Lab Results  Component Value Date   HGBA1C 7.0 (A) 04/17/2022   HGBA1C 7.4 (A) 12/12/2021   HGBA1C 6.9 (A) 08/08/2021   HGBA1C 7.9 (H) 03/23/2021   HGBA1C 6.7 (A) 11/18/2020   Lab Results  Component Value Date   INSULIN 7.9 09/06/2020   Lab Results  Component Value Date   TSH 0.82 01/31/2022   Lab Results  Component Value Date   CHOL 148 01/31/2022   HDL 39.20 01/31/2022   LDLCALC 71  01/31/2022   LDLDIRECT 125.0 12/26/2018   TRIG 191.0 (H) 01/31/2022   CHOLHDL 4 01/31/2022   Lab Results  Component Value Date   VD25OH 70.2 03/23/2021   VD25OH 55.3 09/06/2020   VD25OH 63.0 06/13/2020   Lab Results  Component Value Date   WBC 8.6 01/31/2022   HGB 16.7 01/31/2022   HCT 49.1 01/31/2022   MCV 97.5 01/31/2022   PLT 239.0 01/31/2022   Lab Results  Component Value Date   IRON 144 02/08/2020   TIBC 358 02/08/2020   FERRITIN 96 02/08/2020   Attestation Statements:   Reviewed by clinician on day of visit: allergies, medications, problem list, medical  history, surgical history, family history, social history, and previous encounter notes.   I, Burt Knack, am acting as transcriptionist for Quillian Quince, MD.  I have reviewed the above documentation for accuracy and completeness, and I agree with the above. -  Quillian Quince, MD

## 2022-08-30 ENCOUNTER — Telehealth: Payer: Self-pay | Admitting: Internal Medicine

## 2022-08-30 ENCOUNTER — Encounter: Payer: Self-pay | Admitting: Internal Medicine

## 2022-08-30 DIAGNOSIS — L84 Corns and callosities: Secondary | ICD-10-CM | POA: Diagnosis not present

## 2022-08-30 DIAGNOSIS — E1142 Type 2 diabetes mellitus with diabetic polyneuropathy: Secondary | ICD-10-CM | POA: Diagnosis not present

## 2022-08-30 NOTE — Telephone Encounter (Signed)
Sorry, I meant to say "paper clip" not safety pin

## 2022-08-30 NOTE — Telephone Encounter (Signed)
FMLA paperwork to be filled out--placed in Dr's folder.  Please fax to number on form and then mail a copy to the patient upon completion.   ** Pt filled out back form with information for Dr. Fabian Sharp to fill out the forms with.

## 2022-08-31 ENCOUNTER — Encounter: Payer: Self-pay | Admitting: Internal Medicine

## 2022-09-03 NOTE — Telephone Encounter (Signed)
Yes Need visit  if I am doing an fmla form

## 2022-09-04 NOTE — Progress Notes (Signed)
Today number Subjective:     Patient ID: Troy [name], male   DOB: [birth year], 52 y.o.   MRN: [id number]  HPI Troy [name] is a pleasant 52 y.o. man, returning for management of DM2, dx 2012, insulin-dependent, with complications (DR), uncontrolled.  Last visit 4 months ago.  Interim history: He continues to work with the weight management clinic.  No increased urination, blurry vision (has floaters), nausea, chest pain. In last mo, he had multiple dietary indiscretions.  Sugars have been higher. He has L elbow pain after lifting a heavy suitcase.  He was seeing sports medicine tomorrow.  Reviewed HbA1c levels: Lab Results  Component Value Date   HGBA1C 7.0 (A) 04/17/2022   HGBA1C 7.4 (A) 12/12/2021   HGBA1C 6.9 (A) 08/08/2021   He is on: - Metformin XR 2000 mg in a.m. 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 He was on Victoza before >> no SEs.   He checks his sugars more than 4 times a day with his CGM:  Previously:  Previously:   Lowest: 49 >> 60 >> 45. Highest: 302 -  bolusing, [employer] >> 300s >> 300.  B'fast: meat or shake Lunch: meat + veggies + bread Dinner: meat + veggies  -No CKD: Lab Results  Component Value Date   BUN 14 01/31/2022   BUN 17 03/23/2021   CREATININE 0.82 01/31/2022   CREATININE 0.76 03/23/2021   No MAU: Lab Results  Component Value Date   MICRALBCREAT 1.0 01/31/2022   MICRALBCREAT 6 02/08/2020   MICRALBCREAT 0.5 12/26/2018   MICRALBCREAT 1.4 07/22/2017   MICRALBCREAT 2.2 12/18/2016   MICRALBCREAT 5.8 10/18/2015   MICRALBCREAT 2.0 03/16/2013   MICRALBCREAT 1.6 11/06/2012   MICRALBCREAT 2.5 07/31/2012   MICRALBCREAT 3.6 11/26/2011  He is not on ACE inhibitor/ARB.  -+ HL: Lab  Results  Component Value Date   CHOL 148 01/31/2022   HDL 39.20 01/31/2022   LDLCALC 71 01/31/2022   LDLDIRECT 125.0 12/26/2018   TRIG 191.0 (H) 01/31/2022   CHOLHDL 4 01/31/2022  On Lipitor 20.  - Last eye exam was in 10/2021: + DR - improved.  He sees a retina specialist.  - No numbness and tingling in feet - sees podiatry.  Previously on gabapentin, then on Lyrica, then off.  Last foot exam 08/30/2022 by Dr. Flossie Dibble.  He has OSA and is compliant with his CPAP. He had a DVT in R leg in 04/2015.  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  He noticed that sugars were higher after he started Eliquis. He lost 45 lbs before 2017 - mostly vegan food.  He has a history of ADD. He did see urology before and was given Viagra, but causes for ED were not explored. We checked his testosterone level.  This returned mildly low: Component     Latest Ref Rng & Units 04/27/2019  Testosterone, Serum (Total)     ng/dL 784 (L)  % Free Testosterone     % 1.9  Free Testosterone, S     pg/mL 50 (L)  Sex Hormone Binding Globulin     nmol/L 34.5  After the above results,  I referred him to the weight management clinic. He was started on Citalopram for presumed dx. Of andropause - feels much better - his irritability resolved.   After an episode of syncope, we checked him for adrenal insufficiency -normal cosyntropin stimulation test: Component     Latest Ref Rng & Units 11/24/2020 11/24/2020 11/24/2020         2:08 PM  2:39 PM  3:05 PM  Cortisol, Plasma     ug/dL 9.1 65.7 84.6   He had epidural steroid injections (02/2020 and 03/2020) and then cervical decompression surgery (diskectomy, fusion) on 05/25/2020. He experienced vertigo in 09/2020, before a diagnosis of COVID-19.  He then had a syncopal episode (CBG 160).  He had milder episodes afterwards - but not recently.  No orthostasis.  We checked him for adrenal insufficiency and investigation was negative  after last visit.  He developed leg weakness and had several falls.  He had extensive investigation >> negative. He was referred to cardiology in [place] - now has a diagnosis of dysautonomia (not POTS). He is on a high salt diet, medication, and compression garment.   Review of Systems: + See HPI  + hand tremors/no numbness/no tingling/+ dizziness  I reviewed pt's medications, allergies, PMH, social hx, family hx, and changes were documented in the history of present illness. Otherwise, unchanged from my initial visit note.  Past Medical History:  Diagnosis Date   Arm weakness    right    Asthma    B12 deficiency    Back pain    Complication of anesthesia    "as a teenager , as they were waken up, I was shaking all over- they called it convulsions -it has never happen again"   Diabetes insipidus ([name])    Diabetes mellitus without complication ([name])    TYPE II   DVT (deep venous thrombosis) ([name])    right leg last time 2020   Leg edema, right    Male infertility    Neck injury 12/22/2013   Neuropathy    Obesity    Obesity    OSA (obstructive sleep apnea)    CPAP   Past Surgical History:  Procedure Laterality Date   ANTERIOR CERVICAL DECOMP/DISCECTOMY FUSION N/A 05/25/2020   Procedure: ANTERIOR CERVICAL DECOMPRESSION/DISCECTOMY FUSION, INTERBODY PROSTHESIS, PLATE/SCREWS CERVICAL FIVE- CERVICAL SIX;  Surgeon: Tressie Stalker, MD;  Location: [facility] OR;  Service: Neurosurgery;  Laterality: N/A;   BACK SURGERY     CERVICAL DISC SURGERY     C6/C7 2009   cubital tunnel left arm     2003   ELBOW SURGERY     FIBULA FRACTURE SURGERY     plate & pin removed due to infection 1997   ULNAR NERVE REPAIR     Social History   Socioeconomic History   Marital status: Married    Spouse name: [name]   Number of children: 1   Years of education: Not on file   Highest education level: Bachelor's degree (e.g., BA, AB, BS)  Occupational History   Occupation: Customer Service Lead  Tobacco Use    Smoking status: Never   Smokeless tobacco: Never  Vaping Use   Vaping Use: Never used  Substance and Sexual Activity   Alcohol use: Yes    Comment: 1 - 2 a year   Drug use: No   Sexual activity: Yes  Other Topics Concern   Not on file  Social History Narrative   Occupation:  days [name]   Worked 11-  8 am  Now changed job and shift to 9-8 Mon through Thursday    Working  Ft 40 hours mostly   Married 10 10 10    Wife s/p bariatric surgery pt   Regular exercise- yes   Pt does have children   Father died suddenly May 09, 2008   Mom passes 2017  Acute rep disease after acute renal failure   Daily caffeine use one a day   2 dogs and cat.       Right Handed             Social Determinants of Health   Financial Resource Strain: Medium Risk (06/05/2021)   Overall Financial Resource Strain (CARDIA)    Difficulty of Paying Living Expenses: Somewhat hard  Food Insecurity: Food Insecurity Present (06/05/2021)   Hunger Vital Sign    Worried About Running Out of Food in the Last Year: Sometimes true    Ran Out of Food in the Last Year: Never true  Transportation Needs: No Transportation Needs (06/05/2021)   PRAPARE - Administrator, Civil Service (Medical): No    Lack of Transportation (Non-Medical): No  Physical Activity: Insufficiently Active (06/05/2021)   Exercise Vital Sign    Days of Exercise per Week: 3 days    Minutes of Exercise per Session: 40 min  Stress: No Stress Concern Present (06/05/2021)   [employer] of Occupational Health - Occupational Stress Questionnaire    Feeling of Stress : Only a little  Social Connections: Unknown (06/05/2021)   Social Connection and Isolation Panel [NHANES]    Frequency of Communication with Friends and Family: Twice a week    Frequency of Social Gatherings with Friends and Family: Not on file    Attends Religious Services: Never    Database administrator or Organizations: No    Attends Engineer, structural: Not on  file    Marital Status: Married  Catering manager Violence: Not on file   Current Outpatient Medications on File Prior to Visit  Medication Sig Dispense Refill   albuterol (PROVENTIL HFA;VENTOLIN HFA) 108 (90 Base) MCG/ACT inhaler Inhale 2 puffs into the lungs every 6 (six) hours as needed. For wheezing 1 Inhaler 2   apixaban (ELIQUIS) 5 MG TABS tablet TAKE 1 TABLET(5 MG) BY MOUTH TWICE DAILY 180 tablet 2   atorvastatin (LIPITOR) 40 MG tablet TAKE 1 TABLET DAILY 90 tablet 2   cholecalciferol (VITAMIN D3) 25 MCG (1000 UNIT) tablet Take 1,000 Units by mouth daily.     Continuous Blood Gluc Sensor (DEXCOM G7 SENSOR) MISC 3 each by Does not apply route every 30 (thirty) days. Apply 1 sensor every 10 days 9 each 4   empagliflozin (JARDIANCE) 25 MG TABS tablet TAKE 1 TABLET BY MOUTH EVERY DAY BEFORE BREAKFAST 90 tablet 0   escitalopram (LEXAPRO) 20 MG tablet Take 1 tablet (20 mg total) by mouth daily. 90 tablet 0   insulin aspart (NOVOLOG) 100 UNIT/ML injection Use 80 units a day in the insulin pump 90 mL 3   Insulin Disposable Pump (V-GO 30) 30 UNIT/24HR KIT Inject 1 each into the skin daily. 90 kit 3   metFORMIN (GLUCOPHAGE-XR) 500 MG 24 hr tablet TAKE 4 TABLETS DAILY WITH BREAKFAST 360 tablet 3   midodrine (PROAMATINE) 2.5 MG tablet Take 2.5 mg by mouth 3 (three) times daily with meals.     nitroGLYCERIN (NITROSTAT) 0.4 MG SL tablet Place 1 tablet (0.4 mg total) under the tongue every 5 (five) minutes as  needed for chest pain. 90 tablet 3   Semaglutide, 2 MG/DOSE, (OZEMPIC, 2 MG/DOSE,) 8 MG/3ML SOPN Inject 2 mg into the skin once a week. 9 mL 3   No current facility-administered medications on file prior to visit.   No Known Allergies Family History  Problem Relation Age of Onset   Heart disease Mother    Other Mother        clotting disorders   Diabetes Mother    High blood pressure Mother    Kidney disease Mother    Depression Mother    Sleep apnea Mother    Obesity Mother    Other  Father        sudden death/cervical and lumbar disk disease   Aneurysm Father        In his 68s smoked   Hyperlipidemia Father    High blood pressure Father    Sudden death Father    Alcoholism Father    Hypertension Other    Allergies Other    Deep vein thrombosis Other    Sleep apnea Other    Obesity Other     Objective:   Physical Exam BP 120/78   Pulse 80   Ht 5\' 10"  (1.778 m)   Wt 300 lb (136.1 kg)   SpO2 96%   BMI 43.05 kg/m   Wt Readings from Last 3 Encounters:  09/05/22 300 lb (136.1 kg)  09/05/22 294 lb (133.4 kg)  08/08/22 291 lb (132 kg)   Constitutional: overweight, in NAD Eyes: EOMI, no exophthalmos ENT: no thyromegaly, no cervical lymphadenopathy Cardiovascular: RRR, No MRG, + RLE swelling -chronic, after his DVT  Respiratory: CTA B Musculoskeletal: no deformities Skin: no rashes Neurological: + Mild tremors with outstretched hands  Assessment:     1. DM2, insulin-dependent, uncontrolled, with complications - DR  2. Obesity class 3  3. HL  Plan:     1. Patient with history of uncontrolled diabetes, on oral antidiabetic regimen with metformin and SGLT2 inhibitor and also weekly GLP-1 receptor agonist along with a V-Go patch pump, with suboptimal control.  At last visit, he was on [facility] but was interested to switch back to [facility] which he felt was working better for him.  I suggested to start 2 mg weekly.  I called this in for him.  We did not change the rest of the regimen. CGM interpretation: -At today's visit, we reviewed his CGM downloads: It appears that 38% of values are in target range (goal >70%), while 62% are higher than 180 (goal <25%), and 0% are lower than 70 (goal <4%).  The calculated average blood sugar is 205.  The projected HbA1c for the next 3 months (GMI) is 8.2%. -Reviewing the CGM trends, sugars have been quite high, increased after every meal.  Upon questioning, he is not taking the higher dose of insulin recommended for  larger meals as he mentions that he is not eating larger meals.  We discussed that a larger meal is not necessarily a large volume meal, but a meal that would increase his blood sugars above 180 so he needs to use more clicks for such meals.  However, in the last 3 days, after he started to improve his diet, sugars have been mostly at goal.  Therefore, at today's visit we discussed about continuing the same regimen.  If sugars are still elevated at next visit, we may need to take him off the VGo and start insulin pen injections or switch back to [facility].  He would like to avoid this for now because of more side effects than Ozempic. - I advised him to: Patient Instructions  Please continue: - Metformin XR 2000 mg at dinnertime - Jardiance 25 mg before b'fast  - VGo30 4-6 clicks per meal + use 8 clicks for larger meals (use this for meals that increase your blood sugar >50 mg/dl) - Ozempic 2 mg weekly   Please return in 4 months.  - we checked his HbA1c: 7.7% (higher) - advised to check sugars at different times of the day - 4x a day, rotating check times - advised for yearly eye exams >> he is UTD - return to clinic in 4 months  2. Obesity class 3 -continue SGLT 2 inhibitor and GLP-1 receptor agonist which should also help with weight loss -He is seen in the weight management clinic.  Before last visit, he gained weight which he attributes to retaining fluid due to cardiology advising him to increase his sodium intake to help with his dysautonomia. -At this visit, he returns after a month of dietary indiscretions -he gained approximately 6 pounds since last visit  3. HL -Reviewed latest lipid panel from 01/2022: Triglycerides slightly elevated, otherwise fractions at goal: Lab Results  Component Value Date   CHOL 148 01/31/2022   HDL 39.20 01/31/2022   LDLCALC 71 01/31/2022   LDLDIRECT 125.0 12/26/2018   TRIG 191.0 (H) 01/31/2022   CHOLHDL 4 01/31/2022  -He continues Lipitor 40 mg  daily without side effects  Carlus Pavlov, MD PhD [facility] Endocrinology

## 2022-09-05 ENCOUNTER — Encounter (INDEPENDENT_AMBULATORY_CARE_PROVIDER_SITE_OTHER): Payer: Self-pay | Admitting: Family Medicine

## 2022-09-05 ENCOUNTER — Telehealth (INDEPENDENT_AMBULATORY_CARE_PROVIDER_SITE_OTHER): Payer: BC Managed Care – PPO | Admitting: Internal Medicine

## 2022-09-05 ENCOUNTER — Encounter: Payer: Self-pay | Admitting: Internal Medicine

## 2022-09-05 ENCOUNTER — Ambulatory Visit (INDEPENDENT_AMBULATORY_CARE_PROVIDER_SITE_OTHER): Payer: BC Managed Care – PPO | Admitting: Internal Medicine

## 2022-09-05 ENCOUNTER — Ambulatory Visit (INDEPENDENT_AMBULATORY_CARE_PROVIDER_SITE_OTHER): Payer: BC Managed Care – PPO | Admitting: Family Medicine

## 2022-09-05 VITALS — BP 111/72 | HR 80 | Temp 97.6°F | Ht 71.0 in | Wt 294.0 lb

## 2022-09-05 VITALS — BP 111/71 | HR 97 | Temp 97.6°F | Ht 70.0 in | Wt 294.0 lb

## 2022-09-05 VITALS — BP 120/78 | HR 80 | Ht 70.0 in | Wt 300.0 lb

## 2022-09-05 DIAGNOSIS — Z7985 Long-term (current) use of injectable non-insulin antidiabetic drugs: Secondary | ICD-10-CM

## 2022-09-05 DIAGNOSIS — Z794 Long term (current) use of insulin: Secondary | ICD-10-CM

## 2022-09-05 DIAGNOSIS — Z6841 Body Mass Index (BMI) 40.0 and over, adult: Secondary | ICD-10-CM

## 2022-09-05 DIAGNOSIS — I82501 Chronic embolism and thrombosis of unspecified deep veins of right lower extremity: Secondary | ICD-10-CM

## 2022-09-05 DIAGNOSIS — E118 Type 2 diabetes mellitus with unspecified complications: Secondary | ICD-10-CM

## 2022-09-05 DIAGNOSIS — E669 Obesity, unspecified: Secondary | ICD-10-CM

## 2022-09-05 DIAGNOSIS — M5090 Cervical disc disorder, unspecified, unspecified cervical region: Secondary | ICD-10-CM

## 2022-09-05 DIAGNOSIS — M25522 Pain in left elbow: Secondary | ICD-10-CM | POA: Diagnosis not present

## 2022-09-05 DIAGNOSIS — G901 Familial dysautonomia [Riley-Day]: Secondary | ICD-10-CM

## 2022-09-05 DIAGNOSIS — Z7984 Long term (current) use of oral hypoglycemic drugs: Secondary | ICD-10-CM | POA: Diagnosis not present

## 2022-09-05 DIAGNOSIS — E66813 Obesity, class 3: Secondary | ICD-10-CM

## 2022-09-05 DIAGNOSIS — E785 Hyperlipidemia, unspecified: Secondary | ICD-10-CM | POA: Diagnosis not present

## 2022-09-05 DIAGNOSIS — Z6838 Body mass index (BMI) 38.0-38.9, adult: Secondary | ICD-10-CM

## 2022-09-05 DIAGNOSIS — E119 Type 2 diabetes mellitus without complications: Secondary | ICD-10-CM | POA: Diagnosis not present

## 2022-09-05 LAB — POCT GLYCOSYLATED HEMOGLOBIN (HGB A1C): Hemoglobin A1C: 7.7 % — AB (ref 4.0–5.6)

## 2022-09-05 NOTE — Patient Instructions (Addendum)
Please continue: - Metformin XR 2000 mg at dinnertime - Jardiance 25 mg before b'fast  - VGo30 4-6 clicks per meal + use 8 clicks for larger meals (use this for meals that increase your blood sugar >50 mg/dl) - Ozempic 2 mg weekly   Please return in 4 months.

## 2022-09-05 NOTE — Progress Notes (Signed)
Virtual Visit via Video Note  I connected with Troy [name] on 09/05/22 at  4:00 PM EDT by a video enabled telemedicine application and verified that I am speaking with the correct person using two identifiers. Location patient: home Location provider:work  office Persons participating in the virtual visit: patient, provider   Patient aware  of the limitations of evaluation and management by telemedicine and  availability of in person appointments. and agreed to proceed.   HPI: Troy [name] presents for video visit  requesting updated fmla form updated  for next 6 mos related to  dx of dysautonomia  Last visi twith me  fall 2023  Undercare:  Dr [name] endo for dm  Dr Dalbert Garnet Weight management and  in last year had eval for dysautonomia bp low   Eval for cp with cards 12 23 Last visit with me PV 11 23  He has been able to work ok with accomodation  ( for neck and hx cervical radicular)  but with low bp management may have a spell that  makes his miss work for hours  at a time multiple days and also requirement to see providers on regular basis . CV every 6 mos , weight management and endo more reg .  Venous insufficiency helped some buyt the compression rx for  dysautonomia    Rx includes compression belly belt and 4 gram of salt per day with midodrine .  See Sm for knee issue and recently an elbow st injury  Has seen acupuncture for neck with  help.  ROS: See pertinent positives and negatives per HPI.  Past Medical History:  Diagnosis Date   Arm weakness    right    Asthma    B12 deficiency    Back pain    Complication of anesthesia    "as a teenager , as they were waken up, I was shaking all over- they called it convulsions -it has never happen again"   Diabetes insipidus ([name])    Diabetes mellitus without complication ([name])    TYPE II   DVT (deep venous thrombosis) ([name])    right leg last time 2020   Leg edema, right    Male infertility    Neck injury 12/22/2013    Neuropathy    Obesity    Obesity    OSA (obstructive sleep apnea)    CPAP    Past Surgical History:  Procedure Laterality Date   ANTERIOR CERVICAL DECOMP/DISCECTOMY FUSION N/A 05/25/2020   Procedure: ANTERIOR CERVICAL DECOMPRESSION/DISCECTOMY FUSION, INTERBODY PROSTHESIS, PLATE/SCREWS CERVICAL FIVE- CERVICAL SIX;  Surgeon: Tressie Stalker, MD;  Location: [facility] OR;  Service: Neurosurgery;  Laterality: N/A;   BACK SURGERY     CERVICAL DISC SURGERY     C6/C7 2009   cubital tunnel left arm     2003   ELBOW SURGERY     FIBULA FRACTURE SURGERY     plate & pin removed due to infection 1997   ULNAR NERVE REPAIR      Family History  Problem Relation Age of Onset   Heart disease Mother    Other Mother        clotting disorders   Diabetes Mother    High blood pressure Mother    Kidney disease Mother    Depression Mother    Sleep apnea Mother    Obesity Mother    Other Father        sudden death/cervical and lumbar disk disease   Aneurysm Father  In his 20s smoked   Hyperlipidemia Father    High blood pressure Father    Sudden death Father    Alcoholism Father    Hypertension Other    Allergies Other    Deep vein thrombosis Other    Sleep apnea Other    Obesity Other     Social History   Tobacco Use   Smoking status: Never   Smokeless tobacco: Never  Vaping Use   Vaping Use: Never used  Substance Use Topics   Alcohol use: Yes    Comment: 1 - 2 a year   Drug use: No      Current Outpatient Medications:    albuterol (PROVENTIL HFA;VENTOLIN HFA) 108 (90 Base) MCG/ACT inhaler, Inhale 2 puffs into the lungs every 6 (six) hours as needed. For wheezing, Disp: 1 Inhaler, Rfl: 2   apixaban (ELIQUIS) 5 MG TABS tablet, TAKE 1 TABLET(5 MG) BY MOUTH TWICE DAILY, Disp: 180 tablet, Rfl: 2   atorvastatin (LIPITOR) 40 MG tablet, TAKE 1 TABLET DAILY, Disp: 90 tablet, Rfl: 2   cholecalciferol (VITAMIN D3) 25 MCG (1000 UNIT) tablet, Take 1,000 Units by mouth daily., Disp: ,  Rfl:    Continuous Blood Gluc Sensor (DEXCOM G7 SENSOR) MISC, 3 each by Does not apply route every 30 (thirty) days. Apply 1 sensor every 10 days, Disp: 9 each, Rfl: 4   empagliflozin (JARDIANCE) 25 MG TABS tablet, TAKE 1 TABLET BY MOUTH EVERY DAY BEFORE BREAKFAST, Disp: 90 tablet, Rfl: 0   escitalopram (LEXAPRO) 20 MG tablet, Take 1 tablet (20 mg total) by mouth daily., Disp: 90 tablet, Rfl: 0   insulin aspart (NOVOLOG) 100 UNIT/ML injection, Use 80 units a day in the insulin pump, Disp: 90 mL, Rfl: 3   Insulin Disposable Pump (V-GO 30) 30 UNIT/24HR KIT, Inject 1 each into the skin daily., Disp: 90 kit, Rfl: 3   metFORMIN (GLUCOPHAGE-XR) 500 MG 24 hr tablet, TAKE 4 TABLETS DAILY WITH BREAKFAST, Disp: 360 tablet, Rfl: 3   midodrine (PROAMATINE) 5 MG tablet, Take 5 mg by mouth 2 (two) times daily., Disp: , Rfl:    Semaglutide, 2 MG/DOSE, (OZEMPIC, 2 MG/DOSE,) 8 MG/3ML SOPN, Inject 2 mg into the skin once a week., Disp: 9 mL, Rfl: 3   SODIUM CHLORIDE PO, Take 4 g by mouth., Disp: , Rfl:    nitroGLYCERIN (NITROSTAT) 0.4 MG SL tablet, Place 1 tablet (0.4 mg total) under the tongue every 5 (five) minutes as needed for chest pain., Disp: 90 tablet, Rfl: 3  EXAM: BP Readings from Last 3 Encounters:  09/05/22 111/72  09/05/22 120/78  09/05/22 111/71    VITALS per patient if applicable:  GENERAL: alert, oriented, appears well and in no acute distress  HEENT: atraumatic, conjunttiva clear, no obvious abnormalities on inspection of external nose and ears  NECK: normal movements of the head and neck  LUNGS: on inspection no signs of respiratory distress, breathing rate appears normal, no obvious gross SOB, gasping or wheezing  CV: no obvious cyanosis   PSYCH/NEURO: pleasant and cooperative, no obvious depression or anxiety, speech and thought processing grossly intact Lab Results  Component Value Date   WBC 8.6 01/31/2022   HGB 16.7 01/31/2022   HCT 49.1 01/31/2022   PLT 239.0 01/31/2022    GLUCOSE 163 (H) 01/31/2022   CHOL 148 01/31/2022   TRIG 191.0 (H) 01/31/2022   HDL 39.20 01/31/2022   LDLDIRECT 125.0 12/26/2018   LDLCALC 71 01/31/2022   ALT 23 01/31/2022  AST 15 01/31/2022   NA 137 01/31/2022   K 4.4 01/31/2022   CL 101 01/31/2022   CREATININE 0.82 01/31/2022   BUN 14 01/31/2022   CO2 27 01/31/2022   TSH 0.82 01/31/2022   HGBA1C 7.0 (A) 04/17/2022   MICROALBUR <0.7 01/31/2022    ASSESSMENT AND PLAN:  Discussed the following assessment and plan:    ICD-10-CM   1. Dysautonomia ([name])  G90.1    w orthostatic  hypotension    2. Obesity, Current BMI 40.0  E66.01    Z68.38     3. Chronic deep vein thrombosis (DVT) of right lower extremity, unspecified vein ([name])  I82.501     4. Cervical disc disorder  M50.90     5. Type 2 diabetes mellitus with complication, with long-term current use of insulin ([name])  E11.8    Z79.4     Reviewed   under rx plan felt to be neuro based poss underlying  dm and covid triggered  Form to be completed  for fmla  corthostatic hypotension condition Counseled.   Expectant management and discussion of plan and treatment with opportunity to ask questions and all were answered. The patient agreed with the plan and demonstrated an understanding of the instructions.   Advised to call back or seek an in-person evaluation if worsening  or having  further concerns  in interim. Return for when planned.    Berniece Andreas, MD

## 2022-09-06 ENCOUNTER — Encounter: Payer: Self-pay | Admitting: Family Medicine

## 2022-09-06 ENCOUNTER — Ambulatory Visit (INDEPENDENT_AMBULATORY_CARE_PROVIDER_SITE_OTHER): Payer: BC Managed Care – PPO | Admitting: Family Medicine

## 2022-09-06 ENCOUNTER — Ambulatory Visit (INDEPENDENT_AMBULATORY_CARE_PROVIDER_SITE_OTHER): Payer: BC Managed Care – PPO

## 2022-09-06 VITALS — BP 120/78 | HR 92 | Ht 71.0 in | Wt 300.0 lb

## 2022-09-06 DIAGNOSIS — G4733 Obstructive sleep apnea (adult) (pediatric): Secondary | ICD-10-CM

## 2022-09-06 DIAGNOSIS — M25522 Pain in left elbow: Secondary | ICD-10-CM

## 2022-09-06 DIAGNOSIS — M19022 Primary osteoarthritis, left elbow: Secondary | ICD-10-CM | POA: Diagnosis not present

## 2022-09-06 DIAGNOSIS — M7712 Lateral epicondylitis, left elbow: Secondary | ICD-10-CM

## 2022-09-06 NOTE — Progress Notes (Signed)
I, Stevenson Clinch, CMA acting as a scribe for Clementeen Graham, MD.  Troy [name] is a 52 y.o. male who presents to [facility] at [facility] today for L elbow pain. Pt was previously seen by Dr. Denyse Amass on 03/27/22 for his L knee pain and prior in 2022-21 for cervical radiculitis.  Today, pt c/o L elbow pain that started after coming back from a cruise May 16th. Notes lifting an extremely heavy back into the car after travel, feels that this causes exacerbation of sx.. Pain is localized to the elbow. Unable to cold a drink in the left hand without pain. Feels like the elbow is strained.   Radiates: localized Paresthesia: no Grip strength: normal Aggravates:lifting, full extension Treatments tried: eval with Dr. Dalbert Garnet    Of note he mentions that his cardiologist wanted him to be rechecked for sleep apnea.  Cardiology referred to neurology and that referral was lost to follow-up in February.  He is currently being treated with sleep apnea with a CPAP machine which she tolerates well.  Pertinent review of systems: No fevers or chills  Relevant historical information: History of DVT.  History of right arm nerve injury. Sleep apnea  Exam:  BP 120/78   Pulse 92   Ht 5\' 11"  (1.803 m)   Wt 300 lb (136.1 kg)   SpO2 97%   BMI 41.84 kg/m  General: Well Developed, well nourished, and in no acute distress.   MSK: Left elbow normal appearing. Normal elbow motion. Intact strength flexion and extension. Tender palpation medial epicondyle.  Pain with resisted wrist extension and grip are present.    Lab and Radiology Results  X-ray images left elbow obtained today personally and independently interpreted Bone spur and some mild changes medial aspect of elbow.  Otherwise normal.  No acute fractures are present. Await formal radiology review   Assessment and Plan: 52 y.o. male with left elbow pain thought to be lateral epicondylitis.  Plan to treat with home exercise  program taught in clinic today.  Also referred to occupational therapy.  He had a history of an extensive nerve injury in the right arm and I think he well benefit from the professional expertise of a good hand therapist.   To expedite sleep medicine referral will refer to pulmonology.  They should be able to get him in pretty soon to continue to manage his sleep apnea.  PDMP not reviewed this encounter. Orders Placed This Encounter  Procedures   DG ELBOW COMPLETE LEFT (3+VIEW)    Standing Status:   Future    Number of Occurrences:   1    Standing Expiration Date:   09/06/2023    Order Specific Question:   Reason for Exam (SYMPTOM  OR DIAGNOSIS REQUIRED)    Answer:   eval left elbnow pain    Order Specific Question:   Preferred imaging location?    Answer:   [facility]   Ambulatory referral to Occupational Therapy    Referral Priority:   Routine    Referral Type:   Occupational Therapy    Referral Reason:   Specialty Services Required    Requested Specialty:   Occupational Therapy    Number of Visits Requested:   1   Ambulatory referral to Pulmonology    Referral Priority:   Routine    Referral Type:   Consultation    Referral Reason:   Specialty Services Required    Requested Specialty:   Pulmonary Disease  Number of Visits Requested:   1   No orders of the defined types were placed in this encounter.    Discussed warning signs or symptoms. Please see discharge instructions. Patient expresses understanding.   The above documentation has been reviewed and is accurate and complete Clementeen Graham, M.D.

## 2022-09-06 NOTE — Telephone Encounter (Signed)
Form was faxed and received a confirmation.  

## 2022-09-06 NOTE — Progress Notes (Signed)
Chief Complaint:   OBESITY Troy [name] is here to discuss his progress with his obesity treatment plan along with follow-up of his obesity related diagnoses. Troy [name] is on the Category 3 Plan and states he is following his eating plan approximately 50% of the time. Troy [name] states he is doing 0 minutes 0 times per week.  Today's visit was #: 37 Starting weight: 293 lbs Starting date: 09/07/2019 Today's weight: 294 lbs Today's date: 09/05/2022 Total lbs lost to date: 0 Total lbs lost since last in-office visit: 0  Interim History: Patient notes a lot of celebrations this month and he was not able to follow his eating plan as well.  He has more celebrations this month as well.  He is doing well with drinking his water, and getting approximately 150 ounces per day.  Subjective:   1. Left elbow pain Patient strained his elbow after lifting a heavy suitcase last month.  Assessment/Plan:   1. Left elbow pain We will refer the patient to Dr. Denyse Amass, sports medicine for evaluation.  2. BMI 40.0-44.9, adult ([name])  3. Obesity, Beginning BMI 40.87 Troy [name] is currently in the action stage of change. As such, his goal is to continue with weight loss efforts. He has agreed to the Category 3 Plan.   Patient's goal is to keep meeting protein goals and work on avoiding weight gain next month.  Behavioral modification strategies: increasing lean protein intake and meal planning and cooking strategies.  Troy [name] has agreed to follow-up with our clinic in 4 weeks. He was informed of the importance of frequent follow-up visits to maximize his success with intensive lifestyle modifications for his multiple health conditions.   Objective:   Blood pressure 111/71, pulse 97, temperature 97.6 F (36.4 C), height 5\' 10"  (1.778 m), weight 294 lb (133.4 kg), SpO2 97 %. Body mass index is 42.18 kg/m.  Lab Results  Component Value Date   CREATININE 0.82 01/31/2022   BUN 14 01/31/2022   NA 137  01/31/2022   K 4.4 01/31/2022   CL 101 01/31/2022   CO2 27 01/31/2022   Lab Results  Component Value Date   ALT 23 01/31/2022   AST 15 01/31/2022   ALKPHOS 59 01/31/2022   BILITOT 0.8 01/31/2022   Lab Results  Component Value Date   HGBA1C 7.0 (A) 04/17/2022   HGBA1C 7.4 (A) 12/12/2021   HGBA1C 6.9 (A) 08/08/2021   HGBA1C 7.9 (H) 03/23/2021   HGBA1C 6.7 (A) 11/18/2020   Lab Results  Component Value Date   INSULIN 7.9 09/06/2020   Lab Results  Component Value Date   TSH 0.82 01/31/2022   Lab Results  Component Value Date   CHOL 148 01/31/2022   HDL 39.20 01/31/2022   LDLCALC 71 01/31/2022   LDLDIRECT 125.0 12/26/2018   TRIG 191.0 (H) 01/31/2022   CHOLHDL 4 01/31/2022   Lab Results  Component Value Date   VD25OH 70.2 03/23/2021   VD25OH 55.3 09/06/2020   VD25OH 63.0 06/13/2020   Lab Results  Component Value Date   WBC 8.6 01/31/2022   HGB 16.7 01/31/2022   HCT 49.1 01/31/2022   MCV 97.5 01/31/2022   PLT 239.0 01/31/2022   Lab Results  Component Value Date   IRON 144 02/08/2020   TIBC 358 02/08/2020   FERRITIN 96 02/08/2020   Attestation Statements:   Reviewed by clinician on day of visit: allergies, medications, problem list, medical history, surgical history, family history, social history, and previous encounter notes.  Time  spent on visit including pre-visit chart review and post-visit care and charting was 30 minutes.   I, Trixie Dredge, am acting as transcriptionist for Dennard Nip, MD.  I have reviewed the above documentation for accuracy and completeness, and I agree with the above. -  Dennard Nip, MD

## 2022-09-06 NOTE — Telephone Encounter (Signed)
So placed updated form on your desk . To send the completed [id number]..... form (  not the A24 form)

## 2022-09-06 NOTE — Patient Instructions (Addendum)
Thank you for coming in today.   Please get an Xray today before you leave   Please complete the exercises that the athletic trainer went over with you:  View at [email or web address] using code: [id number]  I've referred you to Occupational Therapy.  Let us know if you don't hear from them in one week.   You should hear about the pulmonaologist.

## 2022-09-06 NOTE — Telephone Encounter (Signed)
Spoke to pt and pt is aware that form was sent.

## 2022-09-12 NOTE — Progress Notes (Signed)
Left elbow x-ray shows a little bit of arthritis.  No fractures are present.

## 2022-09-19 NOTE — Therapy (Signed)
OUTPATIENT OCCUPATIONAL THERAPY ORTHO EVALUATION  Patient Name: Troy [name] MRN: [id number] DOB:[birth year], 52 y.o., male Today's Date: 09/20/2022  PCP: Berniece Andreas, MD REFERRING PROVIDER:  Rodolph Bong, MD    END OF SESSION:  OT End of Session - 09/20/22 1429     Visit Number 1    Number of Visits 8    Date for OT Re-Evaluation 10/19/22    Authorization Type [employer]    OT Start Time 1430    OT Stop Time 1521    OT Time Calculation (min) 51 min    Activity Tolerance Patient tolerated treatment well;Patient limited by pain;Patient limited by fatigue;Patient limited by lethargy    Behavior During Therapy [facility] for tasks assessed/performed             Past Medical History:  Diagnosis Date   Arm weakness    right    Asthma    B12 deficiency    Back pain    Complication of anesthesia    "as a teenager , as they were waken up, I was shaking all over- they called it convulsions -it has never happen again"   Diabetes insipidus ([name])    Diabetes mellitus without complication ([name])    TYPE II   DVT (deep venous thrombosis) ([name])    right leg last time 2020   Leg edema, right    Male infertility    Neck injury 12/22/2013   Neuropathy    Obesity    Obesity    OSA (obstructive sleep apnea)    CPAP   Past Surgical History:  Procedure Laterality Date   ANTERIOR CERVICAL DECOMP/DISCECTOMY FUSION N/A 05/25/2020   Procedure: ANTERIOR CERVICAL DECOMPRESSION/DISCECTOMY FUSION, INTERBODY PROSTHESIS, PLATE/SCREWS CERVICAL FIVE- CERVICAL SIX;  Surgeon: Tressie Stalker, MD;  Location: [facility] OR;  Service: Neurosurgery;  Laterality: N/A;   BACK SURGERY     CERVICAL DISC SURGERY     C6/C7 2009   cubital tunnel left arm     2003   ELBOW SURGERY     FIBULA FRACTURE SURGERY     plate & pin removed due to infection 1997   ULNAR NERVE REPAIR     Patient Active Problem List   Diagnosis Date Noted   Left elbow pain 09/05/2022   BMI 40.0-44.9, adult ([name]) 05/01/2022   Obesity,  Beginning BMI 40.87 05/01/2022   Chest pain, typical angina 02/28/2022   Left knee pain 01/15/2022   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   Class 2 severe obesity with serious comorbidity and body mass index (BMI) of 38.0 to 38.9 in adult [facility]) 12/19/2021   Viral URI 11/30/2021   Depression 10/31/2021   Syncope 03/23/2021   Elevated hemoglobin ([name]) 01/16/2021   Orthostatic dizzinesshypotension 11/24/2020   Mandible pain 07/13/2020   Diarrhea 05/16/2020   Cervical radiculitis 04/12/2020   Constipation 03/31/2020   Cervicalgia 02/29/2020   Radiculopathy 01/25/2020   Hyperlipidemia associated with type 2 diabetes mellitus ([name]) 12/22/2019   Vitamin D deficiency 09/08/2019   Low HDL (under 40) 09/08/2019   Pancreatic insufficiency 03/28/2018   Type 2 diabetes mellitus with complication, with long-term current use of insulin ([name]) 09/16/2017   DVT (deep venous thrombosis), right 04/28/2015   Acute deep vein thrombosis (DVT) of femoral vein of right lower extremity ([name]) 04/28/2015   Hemorrhoid 03/23/2013   Varicose vein of leg right 03/23/2013   Visit for preventive health examination 03/23/2013   Diastasis recti 12/22/2012   Tendinitis of right shoulder 09/27/2011   Right shoulder pain  09/27/2011   Allergic rhinitis, cause unspecified 05/23/2011   Sleep disturbance, unspecified 02/21/2011   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   Preventative health care 12/05/2010   Chronic diarrhea of unknown origin pt says not from metformin 12/05/2010   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   URINARY URGENCY 01/21/2009   Hyperlipidemia 01/23/2008   Obstructive sleep apnea 12/22/2007   VARICOSE VEINS, LOWER EXTREMITIES 10/03/2007   ASTHMA 09/19/2007   Mild intermittent asthma 09/19/2007   PLANTAR FASCIITIS, LEFT 06/23/2007   RASH AND OTHER NONSPECIFIC SKIN ERUPTION 05/26/2007   EDEMA 05/26/2007   Class 2 severe obesity with serious comorbidity and body mass index (BMI) of 39.0 to 39.9 in adult ([name])  02/13/2007   DVT, HX OF 01/15/2007   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    ONSET DATE: approx 2 months ago  REFERRING DIAG:  M25.522 (ICD-10-CM) - Left elbow pain  M77.12 (ICD-10-CM) - Lateral epicondylitis of left elbow    THERAPY DIAG:  Pain in left elbow  Stiffness of left wrist, not elsewhere classified  Other lack of coordination  Muscle weakness (generalized)  Stiffness of right hand, not elsewhere classified  Rationale for Evaluation and Treatment: Rehabilitation  SUBJECTIVE:   SUBJECTIVE STATEMENT: He states his main concern today is left elbow pain, mostly in the lateral and dorsal aspects of his elbow.  He states this started after lifting up heavy bags in a pronated position when on vacation.  He does state long and chronic history of pain and problems throughout his entire body including issues with orthostatic hypotension from diabetes, difficulty moving from seated to standing position, pain and problems in his back, respiratory issues, severe traumatic injury to the right arm about 20 years ago that involved tendon transfers and nerve damage going to the right dominant hand and arm.  Due to many severe, chronic comorbidities, a large portion of evaluation time was spent on discussion   PERTINENT HISTORY: Per MD notes: " pt c/o L elbow pain that started after coming back from a cruise May 16th. Notes lifting an extremely heavy back into the car after travel, feels that this causes exacerbation of sx.. Pain is localized to the elbow. Unable to cold a drink in the left hand without pain. Feels like the elbow is strained. "   PRECAUTIONS: Other: caution for orthostatic hypotension and hx of DVTs   RED FLAGS: None   WEIGHT BEARING RESTRICTIONS: No  PAIN:  Are you having pain? Yes: NPRS scale: 1/10 at rest and at worst (with lifting) up to 6-8//10 Pain location: Lt elbow, lateral and dorsal near triceps insertion Pain description: sharp at times, aching at others   Aggravating factors: Lifting with pronated position Relieving factors: Rest and thermal modalities.  FALLS: Has patient fallen in last 6 months? No  LIVING ENVIRONMENT: Lives with: lives with their spouse   PLOF: Independent with modifications as needed  PATIENT GOALS: To decrease pain in left arm and regain ability functional activities.  He would also like some recommendations for managing chronic issues with the right hand after polytrauma and several surgeries.   OBJECTIVE: (All objective assessments below are from initial evaluation on: 09/20/22 unless otherwise specified.)   HAND DOMINANCE: old Rt hand injury forces mostly ambidextrous use   ADLs: Overall ADLs: States decreased ability to grab, hold household objects, pain and inability to open containers  FUNCTIONAL OUTCOME MEASURES: Eval: Patient Specific Functional Scale: 4.3 (picking up tablet, drink from glass, phone)  (Higher Score  =  Better Ability  for the Selected Tasks)      UPPER EXTREMITY ROM     Shoulder to Wrist AROM Right eval Left eval  Shoulder flexion  161  Shoulder abduction    Shoulder extension  53  Shoulder internal rotation  40  Shoulder external rotation  80  Elbow flexion  145 pain  Elbow extension  (-10)  Forearm supination 74 77  Forearm pronation  80 85  Wrist flexion 60 70  Wrist extension 62 70  Wrist ulnar deviation    Wrist radial deviation    Functional dart thrower's motion (F-DTM) in ulnar flexion    F-DTM in radial extension     (Blank rows = not tested)   Hand AROM Right eval Left eval  Full Fist Ability (or Gap to Distal Palmar Crease) Full (some clawing with extended fingers) full  (Blank rows = not tested)   UPPER EXTREMITY MMT:     MMT Left 09/20/22  Shoulder flexion   Shoulder abduction   Shoulder adduction   Shoulder extension   Shoulder internal rotation 5/5  Shoulder external rotation 4/5  Middle trapezius   Lower trapezius   Elbow flexion 4/5 tender  to elbow  Elbow extension 4+/5 non-tender  Forearm supination 5/5  Forearm pronation 5/5  Wrist flexion 5/5  Wrist extension 4+/5  Wrist ulnar deviation   Wrist radial deviation   (Blank rows = not tested)  HAND FUNCTION: Eval: Observed weakness in affected Rt and Lt hands.  Details to be determined Grip strength Right: TBD lbs, Left: TBD lbs   COORDINATION: Eval: Observed coordination impairments with affected Rt and Lt hands.  Details to be determined as needed  SENSATION: Eval:  Light touch intact today in Lt hand, areas of absent sensation in right hand chronically.  Some complaints of nervelike shooting pain to the posterior Lt humerus and elbow.  EDEMA:   Eval: Perhaps very mildly swollen around left olecranon today.  COGNITION: Eval: Overall cognitive status: WFL for evaluation today  OBSERVATIONS:   Eval: He presents fairly oddly today, in terms of left lateral epicondylitis.  Negative pain with resisted wrist extension or middle finger extension.  At times, however he does state some soreness to the elbow with wrist motion.  Biceps flexion resisted was more tender at the elbow then tricep extension, though tricep was mildly tender to palpation around the insertion sites.  Lateral epicondyle was not tender to palpation today nor was the extensor wad, however triceps and anconeus were somewhat tender.  OT feels that his presentation today is due to healing since his initial injury and also the fact that he started on a home exercise program that was initially assigned by his doctor.  OT feels that he is in the process of healing but still has some things to work out.  Ultimately, he does present like a strain to the triceps and possibly supinator with the pain that wraps around the elbow towards the anconeus muscle.  He also does seem to have some irritation to the radial nerve distribution and tightness in the left shoulder.  For the right hand, he has these chronic issues that  may benefit from a "revamped" home exercise program and possibly an anticlaw hand-based orthosis.   TODAY'S TREATMENT:  Post-evaluation treatment:   Due to length of evaluation, OT is able to provide some suggestions in a home exercise program but unable to completely review these with him today.  This is as listed below.  This should be  reviewed in detail in the next session.  He does state understanding that if any of these bother him he should stop and wait until they are thoroughly explained in therapy.  Additionally he is educated to avoid lifting with the palm down, try lifting with palm up and not push or pull anything that is painful to him at this time.  Exercises - Sleeper Stretch  - 2-3 x daily - 3-5 reps - 15 hold - Tricep Stretch- DO SEATED BY TABLE  - 2-3 x daily - 2-3 reps - 15 hold - Forearm Pronation Stretch  - 2-3 x daily - 3-5 reps - 15 sec hold - Wrist Prayer Stretch  - 2-3 x daily - 3-5 reps - 15 sec hold - Seated Wrist Flexion Stretch  - 2-3 x daily - 2-3 reps - 15 second  hold - Standing Radial Nerve Glide  - 2-3 x daily - 1 sets - 5-10 reps  Additionally, for right hand clawing management, he was educated on composite finger extension stretches and the potential benefit of a anticlaw orthosis.  He demonstrates stretches back feeling no pain and stretch through the PIP joints.   PATIENT EDUCATION: Education details: See tx section above for details  Person educated: Patient Education method: Verbal Instruction, Teach back, Handouts  Education comprehension: States and demonstrates understanding, Additional Education required    HOME EXERCISE PROGRAM: Access Code: [email or web address] URL: [email or web address] Date: 09/20/2022 Prepared by: Fannie Knee   GOALS: Goals reviewed with patient? Yes   SHORT TERM GOALS: (STG required if POC>30 days) Target Date: 10/05/22  Pt will obtain protective, custom orthotic. Goal status: TBD/PRN  2.  Pt will  demo/state understanding of initial HEP to improve pain levels and prerequisite motion. Goal status: INITIAL   LONG TERM GOALS: Target Date: 10/19/22  Pt will improve functional ability by decreased impairment per PSFS assessment from 4.3 to 7.5 or better, for better quality of life. Goal status: INITIAL  2.  Pt will improve grip strength in Lt hand to at least 45lbs for functional use at home and in IADLs. Goal status: INITIAL- TBD baseline next session  3.  Pt will improve A/ROM in Lt sh IR from 40* to at least 55*, to have functional motion for tasks like reach and grasp.  Goal status: INITIAL  4.  Pt will improve strength in Lt elbow flexion from tender 4/5 MMT to at least non-tender 5/5 MMT to have increased functional ability to carry out selfcare and higher-level homecare tasks with no difficulty. Goal status: INITIAL  5. Pt will decrease pain at worst from 6-8/10 to 2-3/10 or better to have better sleep and occupational participation in daily roles. Goal status: INITIAL  ASSESSMENT:  CLINICAL IMPRESSION: Patient is a 52 y.o. male who was seen today for occupational therapy evaluation for Lt elbow pain and decreased fnl ability.  He also has some chronic right arm and hand deficits from old polytrauma and several subsequent surgeries for which he may benefit occupational therapy treatment and recommendations as well.  Overall he will benefit from outpatient occupational therapy to increase quality of life and decrease negative symptoms.[name]   PERFORMANCE DEFICITS: in functional skills including ADLs, IADLs, dexterity, sensation, tone, ROM, strength, pain, flexibility, Fine motor control, Gross motor control, body mechanics, endurance, and UE functional use, cognitive skills including problem solving, and psychosocial skills including habits and routines and behaviors.   IMPAIRMENTS: are limiting patient from ADLs, IADLs, work, and leisure.   COMORBIDITIES: has co-morbidities such  as  OSA, morbid obesity, orthostatic hypotension, HTN, DM type II, radiculopathy and cervicalgia, depression ,syncope, hx DVT, and more   that affects occupational performance. Patient will benefit from skilled OT to address above impairments and improve overall function.  MODIFICATION OR ASSISTANCE TO COMPLETE EVALUATION: Min-Moderate modification of tasks or assist with assess necessary to complete an evaluation.  OT OCCUPATIONAL PROFILE AND HISTORY: Detailed assessment: Review of records and additional review of physical, cognitive, psychosocial history related to current functional performance.  CLINICAL DECISION MAKING: Moderate - several treatment options, min-mod task modification necessary  REHAB POTENTIAL: Excellent  EVALUATION COMPLEXITY: Moderate      PLAN:  OT FREQUENCY: 1-2x/week  OT DURATION: 4 weeks  PLANNED INTERVENTIONS: self care/ADL training, therapeutic exercise, therapeutic activity, neuromuscular re-education, manual therapy, scar mobilization, passive range of motion, splinting, electrical stimulation, ultrasound, compression bandaging, moist heat, cryotherapy, contrast bath, patient/family education, energy conservation, coping strategies training, DME and/or AE instructions, Re-evaluation, and Dry needling  RECOMMENDED OTHER SERVICES: none now   CONSULTED AND AGREED WITH PLAN OF CARE: Patient  PLAN FOR NEXT SESSION:   Go over home exercise program in detail, consider grip testing.  Work toward eccentric strengthening, and consider adapting his current "flex bar" strengthening from the doctor to include this.  Attempt manual therapy and modalities as helpful.    Fannie Knee, OTR/L  09/20/2022, 6:26 PM

## 2022-09-20 ENCOUNTER — Encounter: Payer: Self-pay | Admitting: Rehabilitative and Restorative Service Providers"

## 2022-09-20 ENCOUNTER — Ambulatory Visit (INDEPENDENT_AMBULATORY_CARE_PROVIDER_SITE_OTHER): Payer: BC Managed Care – PPO | Admitting: Rehabilitative and Restorative Service Providers"

## 2022-09-20 ENCOUNTER — Other Ambulatory Visit: Payer: Self-pay

## 2022-09-20 DIAGNOSIS — M6281 Muscle weakness (generalized): Secondary | ICD-10-CM | POA: Diagnosis not present

## 2022-09-20 DIAGNOSIS — R278 Other lack of coordination: Secondary | ICD-10-CM | POA: Diagnosis not present

## 2022-09-20 DIAGNOSIS — M25522 Pain in left elbow: Secondary | ICD-10-CM

## 2022-09-20 DIAGNOSIS — M25641 Stiffness of right hand, not elsewhere classified: Secondary | ICD-10-CM

## 2022-09-20 DIAGNOSIS — M25632 Stiffness of left wrist, not elsewhere classified: Secondary | ICD-10-CM | POA: Diagnosis not present

## 2022-09-27 NOTE — Therapy (Signed)
OUTPATIENT OCCUPATIONAL THERAPY TREATMENT NOTE  Patient Name: Troy [name] MRN: [id number] DOB:1970/05/22, 52 y.o., male Today's Date: 10/02/2022  PCP: Berniece Andreas, MD REFERRING PROVIDER:  Rodolph Bong, MD    END OF SESSION:  OT End of Session - 10/02/22 1436     Visit Number 2    Number of Visits 8    Date for OT Re-Evaluation 10/19/22    Authorization Type [employer]    OT Start Time 1435    OT Stop Time 1520    OT Time Calculation (min) 45 min    Equipment Utilized During Treatment [id number]    Activity Tolerance Patient tolerated treatment well;Patient limited by pain;Patient limited by fatigue;Patient limited by lethargy;No increased pain    Behavior During Therapy WFL for tasks assessed/performed             Past Medical History:  Diagnosis Date   Arm weakness    right    Asthma    B12 deficiency    Back pain    Complication of anesthesia    "as a teenager , as they were waken up, I was shaking all over- they called it convulsions -it has never happen again"   Diabetes insipidus ([name])    Diabetes mellitus without complication ([name])    TYPE II   DVT (deep venous thrombosis) ([name])    right leg last time 2020   Leg edema, right    Male infertility    Neck injury 12/22/2013   Neuropathy    Obesity    Obesity    OSA (obstructive sleep apnea)    CPAP   Past Surgical History:  Procedure Laterality Date   ANTERIOR CERVICAL DECOMP/DISCECTOMY FUSION N/A 05/25/2020   Procedure: ANTERIOR CERVICAL DECOMPRESSION/DISCECTOMY FUSION, INTERBODY PROSTHESIS, PLATE/SCREWS CERVICAL FIVE- CERVICAL SIX;  Surgeon: Tressie Stalker, MD;  Location: [facility] OR;  Service: Neurosurgery;  Laterality: N/A;   BACK SURGERY     CERVICAL DISC SURGERY     C6/C7 2009   cubital tunnel left arm     2003   ELBOW SURGERY     FIBULA FRACTURE SURGERY     plate & pin removed due to infection 1997   ULNAR NERVE REPAIR     Patient Active Problem List   Diagnosis Date Noted   Left elbow pain  09/05/2022   BMI 40.0-44.9, adult ([name]) 05/01/2022   Obesity, Beginning BMI 40.87 05/01/2022   Chest pain, typical angina 02/28/2022   Left knee pain 01/15/2022   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   Class 2 severe obesity with serious comorbidity and body mass index (BMI) of 38.0 to 38.9 in adult [facility]) 12/19/2021   Viral URI 11/30/2021   Depression 10/31/2021   Syncope 03/23/2021   Elevated hemoglobin ([name]) 01/16/2021   Orthostatic dizzinesshypotension 11/24/2020   Mandible pain 07/13/2020   Diarrhea 05/16/2020   Cervical radiculitis 04/12/2020   Constipation 03/31/2020   Cervicalgia 02/29/2020   Radiculopathy 01/25/2020   Hyperlipidemia associated with type 2 diabetes mellitus ([name]) 12/22/2019   Vitamin D deficiency 09/08/2019   Low HDL (under 40) 09/08/2019   Pancreatic insufficiency 03/28/2018   Type 2 diabetes mellitus with complication, with long-term current use of insulin ([name]) 09/16/2017   DVT (deep venous thrombosis), right 04/28/2015   Acute deep vein thrombosis (DVT) of femoral vein of right lower extremity ([name]) 04/28/2015   Hemorrhoid 03/23/2013   Varicose vein of leg right 03/23/2013   Visit for preventive health examination 03/23/2013   Diastasis recti 12/22/2012  Tendinitis of right shoulder 09/27/2011   Right shoulder pain 09/27/2011   Allergic rhinitis, cause unspecified 05/23/2011   Sleep disturbance, unspecified 02/21/2011   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   Preventative health care 12/05/2010   Chronic diarrhea of unknown origin pt says not from metformin 12/05/2010   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   URINARY URGENCY 01/21/2009   Hyperlipidemia 01/23/2008   Obstructive sleep apnea 12/22/2007   VARICOSE VEINS, LOWER EXTREMITIES 10/03/2007   ASTHMA 09/19/2007   Mild intermittent asthma 09/19/2007   PLANTAR FASCIITIS, LEFT 06/23/2007   RASH AND OTHER NONSPECIFIC SKIN ERUPTION 05/26/2007   EDEMA 05/26/2007   Class 2 severe obesity with serious  comorbidity and body mass index (BMI) of 39.0 to 39.9 in adult ([name]) 02/13/2007   DVT, HX OF 01/15/2007   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    ONSET DATE: approx 2 months ago  REFERRING DIAG:  M25.522 (ICD-10-CM) - Left elbow pain  M77.12 (ICD-10-CM) - Lateral epicondylitis of left elbow    THERAPY DIAG:  Pain in left elbow  Stiffness of left wrist, not elsewhere classified  Other lack of coordination  Muscle weakness (generalized)  Stiffness of right hand, not elsewhere classified  Rationale for Evaluation and Treatment: Rehabilitation   PERTINENT HISTORY: Per MD notes: " pt c/o L elbow pain that started after coming back from a cruise May 16th. Notes lifting an extremely heavy back into the car after travel, feels that this causes exacerbation of sx.. Pain is localized to the elbow. Unable to cold a drink in the left hand without pain. Feels like the elbow is strained. "  He states his main concern today is left elbow pain, mostly in the lateral and dorsal aspects of his elbow.  He states this started after lifting up heavy bags in a pronated position when on vacation.  He does state long and chronic history of pain and problems throughout his entire body including issues with orthostatic hypotension from diabetes, difficulty moving from seated to standing position, pain and problems in his back, respiratory issues, severe traumatic injury to the right arm about 20 years ago that involved tendon transfers and nerve damage going to the right dominant hand and arm.  Due to many severe, chronic comorbidities, a large portion of evaluation time was spent on discussion  PRECAUTIONS: Other: caution for orthostatic hypotension and hx of DVTs   RED FLAGS: None   WEIGHT BEARING RESTRICTIONS: No   SUBJECTIVE:   SUBJECTIVE STATEMENT: He states he was doing very well until 2 days ago when he lifted a case of water, and now he has increased pain with activities and HEP.    PAIN:   Are you having pain?  Yes: NPRS scale: 1-2/10 at rest and at worst up to 8-9/10 after the case of water  Pain location: Lt elbow, lateral and dorsal near triceps insertion Pain description: sharp at times, aching at others  Aggravating factors: Lifting with pronated position Relieving factors: Rest and thermal modalities.   PATIENT GOALS: To decrease pain in left arm and regain ability functional activities.  He would also like some recommendations for managing chronic issues with the right hand after polytrauma and several surgeries.   OBJECTIVE: (All objective assessments below are from initial evaluation on: 09/20/22 unless otherwise specified.)   HAND DOMINANCE: old Rt hand injury forces mostly ambidextrous use   ADLs: Overall ADLs: States decreased ability to grab, hold household objects, pain and inability to open containers  FUNCTIONAL OUTCOME MEASURES: Eval:  Patient Specific Functional Scale: 4.3 (picking up tablet, drink from glass, phone)  (Higher Score  =  Better Ability for the Selected Tasks)      UPPER EXTREMITY ROM     Shoulder to Wrist AROM Right eval Left eval  Shoulder flexion  161  Shoulder abduction    Shoulder extension  53  Shoulder internal rotation  40  Shoulder external rotation  80  Elbow flexion  145 pain  Elbow extension  (-10)  Forearm supination 74 77  Forearm pronation  80 85  Wrist flexion 60 70  Wrist extension 62 70  Wrist ulnar deviation    Wrist radial deviation    Functional dart thrower's motion (F-DTM) in ulnar flexion    F-DTM in radial extension     (Blank rows = not tested)   Hand AROM Right eval Left eval  Full Fist Ability (or Gap to Distal Palmar Crease) Full (some clawing with extended fingers) full  (Blank rows = not tested)   UPPER EXTREMITY MMT:     MMT Left 09/20/22  Shoulder flexion   Shoulder abduction   Shoulder adduction   Shoulder extension   Shoulder internal rotation 5/5  Shoulder external rotation  4/5  Middle trapezius   Lower trapezius   Elbow flexion 4/5 tender to elbow  Elbow extension 4+/5 non-tender  Forearm supination 5/5  Forearm pronation 5/5  Wrist flexion 5/5  Wrist extension 4+/5  Wrist ulnar deviation   Wrist radial deviation   (Blank rows = not tested)  HAND FUNCTION: 10/02/22: Grip Rt: 61#(old injury), Lt: 68# (not painful- done after therapy today)  Eval: Observed weakness in affected Rt and Lt hands.  Details to be determined Grip strength Right: TBD lbs, Left: TBD lbs   COORDINATION: Eval: Observed coordination impairments with affected Rt and Lt hands.  Details to be determined as needed  SENSATION: Eval:  Light touch intact today in Lt hand, areas of absent sensation in right hand chronically.  Some complaints of nervelike shooting pain to the posterior Lt humerus and elbow.  EDEMA:   Eval: Perhaps very mildly swollen around left olecranon today.  OBSERVATIONS:   Eval: He presents fairly oddly today, in terms of left lateral epicondylitis.  Negative pain with resisted wrist extension or middle finger extension.  At times, however he does state some soreness to the elbow with wrist motion.  Biceps flexion resisted was more tender at the elbow then tricep extension, though tricep was mildly tender to palpation around the insertion sites.  Lateral epicondyle was not tender to palpation today nor was the extensor wad, however triceps and anconeus were somewhat tender.  OT feels that his presentation today is due to healing since his initial injury and also the fact that he started on a home exercise program that was initially assigned by his doctor.  OT feels that he is in the process of healing but still has some things to work out.  Ultimately, he does present like a strain to the triceps and possibly supinator with the pain that wraps around the elbow towards the anconeus muscle.  He also does seem to have some irritation to the radial nerve distribution and  tightness in the left shoulder.  For the right hand, he has these chronic issues that may benefit from a "revamped" home exercise program and possibly an anticlaw hand-based orthosis.   TODAY'S TREATMENT:  10/02/22: He does not appear to have done significant damage from lifting a case of water,  but does have palpable muscle spasms and trigger points in the left tricep and lateral shoulder area.  With his permission and consent OT performs manual therapy dry needling with a 0.30 x 50mm needle inserted partly into the left triceps and deltoid areas trying to achieve a local twitch response.  Despite 5 attempts, no significant twitch responses found, though to palpation the shoulder feels to be less in spasm afterwards.  OT then does manual therapy IASTM to the lateral shoulder area, triceps, lateral epicondyle area and through the extensor wad of the left forearm.  He states having no significant pain with these but feeling some tenderness that relieves after this is completed.  Next, to apply some external support and cueing, OT applies K tape along the radial nerve distribution from the triceps to the supinator area.  He states this feels beneficial to reduce strain and stress to the elbow and it will "help him remember" to not do anything heavy with the left arm.  After that, OT does some manual stretches with him with verbal reviews of HEP stretches which she states he is very comfortable with and doing well with.  OT does some combination stretches of shoulder internal rotation with forearm pronation as well as wrist flexion with forearm pronation.  Lastly, he is educated that he could perform light isometric strengthening with a wand or a stick or such and pro and supination of the forearm as this is less aggressive than using the flex bar actively.  He leaves stating less pain now and feeling looser and also understanding the directions.   PATIENT EDUCATION: Education details: See tx section above for  details  Person educated: Patient Education method: Verbal Instruction, Teach back, Handouts  Education comprehension: States and demonstrates understanding, Additional Education required    HOME EXERCISE PROGRAM: Access Code: [email or web address] URL: [email or web address] Date: 09/20/2022 Prepared by: Fannie Knee   GOALS: Goals reviewed with patient? Yes   SHORT TERM GOALS: (STG required if POC>30 days) Target Date: 10/05/22  Pt will obtain protective, custom orthotic. Goal status: TBD/PRN  2.  Pt will demo/state understanding of initial HEP to improve pain levels and prerequisite motion. Goal status: 10/02/22: MET   LONG TERM GOALS: Target Date: 10/19/22  Pt will improve functional ability by decreased impairment per PSFS assessment from 4.3 to 7.5 or better, for better quality of life. Goal status: INITIAL  2.  Pt will improve grip strength in Lt hand to at least 45lbs for functional use at home and in IADLs. Goal status: INITIAL- TBD baseline next session  3.  Pt will improve A/ROM in Lt sh IR from 40* to at least 55*, to have functional motion for tasks like reach and grasp.  Goal status: INITIAL  4.  Pt will improve strength in Lt elbow flexion from tender 4/5 MMT to at least non-tender 5/5 MMT to have increased functional ability to carry out selfcare and higher-level homecare tasks with no difficulty. Goal status: INITIAL  5. Pt will decrease pain at worst from 6-8/10 to 2-3/10 or better to have better sleep and occupational participation in daily roles. Goal status: INITIAL  ASSESSMENT:  CLINICAL IMPRESSION: 10/02/22: He states that he was doing excellent before having an exacerbation the other day.  Today he does present to be rigid and tight through the upper shoulder, through the radial nerve distribution down the arm and into the lateral epicondyle.  OT did much manual therapy to help address this which she states was beneficial, and  pain was reduced as  well as tightness.  Hopefully his exacerbation resolves and he can progress towards more PRE and active stretching.    PLAN:  OT FREQUENCY: 1-2x/week  OT DURATION: 4 weeks  PLANNED INTERVENTIONS: self care/ADL training, therapeutic exercise, therapeutic activity, neuromuscular re-education, manual therapy, scar mobilization, passive range of motion, splinting, electrical stimulation, ultrasound, compression bandaging, moist heat, cryotherapy, contrast bath, patient/family education, energy conservation, coping strategies training, DME and/or AE instructions, Re-evaluation, and Dry needling  CONSULTED AND AGREED WITH PLAN OF CARE: Patient  PLAN FOR NEXT SESSION:   Check motion and tenderness, check tolerance to light isometrics as taught last session with a hammer/spatula.  Check on his status after dry needling and K tape.  As tolerated advance to light eccentric exercises at the wrist and elbow level.   Fannie Knee, OTR/L  10/02/2022, 5:55 PM

## 2022-10-02 ENCOUNTER — Ambulatory Visit (INDEPENDENT_AMBULATORY_CARE_PROVIDER_SITE_OTHER): Payer: BC Managed Care – PPO | Admitting: Rehabilitative and Restorative Service Providers"

## 2022-10-02 ENCOUNTER — Encounter: Payer: Self-pay | Admitting: Rehabilitative and Restorative Service Providers"

## 2022-10-02 DIAGNOSIS — M6281 Muscle weakness (generalized): Secondary | ICD-10-CM

## 2022-10-02 DIAGNOSIS — M25522 Pain in left elbow: Secondary | ICD-10-CM

## 2022-10-02 DIAGNOSIS — R278 Other lack of coordination: Secondary | ICD-10-CM

## 2022-10-02 DIAGNOSIS — M25632 Stiffness of left wrist, not elsewhere classified: Secondary | ICD-10-CM

## 2022-10-02 DIAGNOSIS — M25641 Stiffness of right hand, not elsewhere classified: Secondary | ICD-10-CM

## 2022-10-03 ENCOUNTER — Ambulatory Visit (INDEPENDENT_AMBULATORY_CARE_PROVIDER_SITE_OTHER): Payer: BC Managed Care – PPO | Admitting: Family Medicine

## 2022-10-03 ENCOUNTER — Encounter (INDEPENDENT_AMBULATORY_CARE_PROVIDER_SITE_OTHER): Payer: Self-pay | Admitting: Family Medicine

## 2022-10-03 VITALS — BP 107/69 | HR 85 | Temp 98.0°F | Ht 70.0 in | Wt 293.0 lb

## 2022-10-03 DIAGNOSIS — E1169 Type 2 diabetes mellitus with other specified complication: Secondary | ICD-10-CM

## 2022-10-03 DIAGNOSIS — Z6841 Body Mass Index (BMI) 40.0 and over, adult: Secondary | ICD-10-CM

## 2022-10-03 DIAGNOSIS — E785 Hyperlipidemia, unspecified: Secondary | ICD-10-CM | POA: Diagnosis not present

## 2022-10-03 DIAGNOSIS — I951 Orthostatic hypotension: Secondary | ICD-10-CM | POA: Diagnosis not present

## 2022-10-03 DIAGNOSIS — F3289 Other specified depressive episodes: Secondary | ICD-10-CM

## 2022-10-03 DIAGNOSIS — Z7985 Long-term (current) use of injectable non-insulin antidiabetic drugs: Secondary | ICD-10-CM

## 2022-10-03 DIAGNOSIS — R5383 Other fatigue: Secondary | ICD-10-CM | POA: Insufficient documentation

## 2022-10-03 DIAGNOSIS — E669 Obesity, unspecified: Secondary | ICD-10-CM

## 2022-10-03 DIAGNOSIS — Z794 Long term (current) use of insulin: Secondary | ICD-10-CM | POA: Diagnosis not present

## 2022-10-03 DIAGNOSIS — E118 Type 2 diabetes mellitus with unspecified complications: Secondary | ICD-10-CM | POA: Diagnosis not present

## 2022-10-03 DIAGNOSIS — E559 Vitamin D deficiency, unspecified: Secondary | ICD-10-CM | POA: Diagnosis not present

## 2022-10-03 MED ORDER — ESCITALOPRAM OXALATE 20 MG PO TABS: 20.0000 mg | ORAL_TABLET | Freq: Every day | ORAL | 0 refills | Status: DC

## 2022-10-03 NOTE — Progress Notes (Signed)
.[facility]  Office: [phone]  /  Fax: [phone]  WEIGHT SUMMARY AND BIOMETRICS  Anthropometric Measurements Height: 5\' 10"  (1.778 m) Weight: 293 lb (132.9 kg) BMI (Calculated): 42.04 Weight at Last Visit: 294 lb Weight Lost Since Last Visit: 1 lb Weight Gained Since Last Visit: 0 Starting Weight: 293 lb Total Weight Loss (lbs): 0 lb (0 kg) Peak Weight: 405 lb   Body Composition  Body Fat %: 40.9 % Fat Mass (lbs): 120 lbs Muscle Mass (lbs): 165 lbs Total Body Water (lbs): 128.6 lbs Visceral Fat Rating : 25   Other Clinical Data Fasting: Yes Labs: No Today's Visit #: 93 Starting Date: 09/07/22    Chief Complaint: OBESITY     History of Present Illness   The patient is a 52 year old male with a history of obesity, type 2 diabetes, and emotional eating behaviors, who presents today with concerns about orthostatic hypertension. He reports that the condition has been particularly severe recently, with symptoms exacerbated by heat. The patient has been advised to increase his salt intake to 7-10 grams per day, but he finds this challenging due to the taste and the high carbohydrate and calorie content of salty foods. He also reports difficulty with physical activities due to the risk of falling, and has missed work due to air conditioning issues in his workplace.  The patient is currently on amitadrine and has been doing leg exercises and wearing pressure stockings to manage his condition, but he reports that these interventions have not been particularly helpful. He also reports chronic diarrhea for the past 20 years, which he describes as a normal bowel movement for him would be constipation for most people.  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 He reports frustration with family members' refusal to seek medical help and their resistance to mental health treatment.  The patient is  currently on a category 3 diet plan for his obesity and diabetes, which he reports adhering to about 80% of the time. He has lost one pound in the last month. He is also on Lexapro for his depression, which he reports has helped him manage his emotions better at work. He has not been exercising due to concerns about his orthostatic hypertension.  The patient also reports a recent injury from lifting a heavy suitcase, for which he is seeing an occupational therapist. He has been wearing Kinesio tape for the injury. He also reports occasional low blood sugars, with readings in the 40s and 50s, which he can feel. He is on NovoLog, metformin, and Ozempic for his diabetes, and atorvastatin for his cholesterol. He is also on vitamin D supplementation. His last A1c in February was 7.0, down from 7.4.          PHYSICAL EXAM:  Blood pressure 107/69, pulse 85, temperature 98 F (36.7 C), height 5\' 10"  (1.778 m), weight 293 lb (132.9 kg), SpO2 94%. Body mass index is 42.04 kg/m.  DIAGNOSTIC DATA REVIEWED:  BMET    Component Value Date/Time   NA 137 01/31/2022 1042   NA 141 03/23/2021 0802   K 4.4 01/31/2022 1042   CL 101 01/31/2022 1042   CO2 27 01/31/2022 1042   GLUCOSE 163 (H) 01/31/2022 1042   BUN 14 01/31/2022 1042   BUN 17 03/23/2021 0802   CREATININE 0.82 01/31/2022 1042   CREATININE 0.79 12/26/2018 1052   CALCIUM 9.0 01/31/2022 1042   GFRNONAA >60 10/31/2020 1506  GFRNONAA 106 12/26/2018 1052   GFRAA 125 12/21/2019 1433   GFRAA 123 12/26/2018 1052   Lab Results  Component Value Date   HGBA1C 7.0 (A) 04/17/2022   HGBA1C 7.3 (H) 05/19/2007   Lab Results  Component Value Date   INSULIN 7.9 09/06/2020   Lab Results  Component Value Date   TSH 0.82 01/31/2022   CBC    Component Value Date/Time   WBC 8.6 01/31/2022 1042   RBC 5.03 01/31/2022 1042   HGB 16.7 01/31/2022 1042   HCT 49.1 01/31/2022 1042   PLT 239.0 01/31/2022 1042   MCV 97.5 01/31/2022 1042   MCH 32.7  10/31/2020 1506   MCHC 34.0 01/31/2022 1042   RDW 12.9 01/31/2022 1042   Iron Studies    Component Value Date/Time   IRON 144 02/08/2020 1102   TIBC 358 02/08/2020 1102   FERRITIN 96 02/08/2020 1102   IRONPCTSAT 40 02/08/2020 1102   Lipid Panel     Component Value Date/Time   CHOL 148 01/31/2022 1042   CHOL 160 09/06/2020 0744   TRIG 191.0 (H) 01/31/2022 1042   HDL 39.20 01/31/2022 1042   HDL 36 (L) 09/06/2020 0744   CHOLHDL 4 01/31/2022 1042   VLDL 38.2 01/31/2022 1042   LDLCALC 71 01/31/2022 1042   LDLCALC 87 09/06/2020 0744   LDLCALC 74 02/08/2020 1102   LDLDIRECT 125.0 12/26/2018 1052   Hepatic Function Panel     Component Value Date/Time   PROT 6.9 01/31/2022 1042   PROT 6.8 03/23/2021 0802   ALBUMIN 4.4 01/31/2022 1042   ALBUMIN 4.6 03/23/2021 0802   AST 15 01/31/2022 1042   ALT 23 01/31/2022 1042   ALKPHOS 59 01/31/2022 1042   BILITOT 0.8 01/31/2022 1042   BILITOT 0.7 03/23/2021 0802   BILIDIR 0.2 01/31/2022 1042      Component Value Date/Time   TSH 0.82 01/31/2022 1042   Nutritional Lab Results  Component Value Date   VD25OH 70.2 03/23/2021   VD25OH 55.3 09/06/2020   VD25OH 63.0 06/13/2020     Assessment and Plan    Orthostatic Hypotension: Worsening symptoms, particularly in heat. Current management includes increased salt intake, amitadrine, leg exercises, pressure stockings, and increased fluid intake. -Continue current management strategies.  Obesity and Type 2 Diabetes: Adherence to category three plan 80% of the time. Minimal weight loss over the past month. Some high blood sugars noted when forgetting to apply Viggo. -Continue with category three plan. -Continue current medications: NovoLog, Metformin, Ozempic. -Check blood sugars regularly and ensure consistent use of Viggo. -Order labs to check A1c, kidney function, liver function, electrolytes, and cholesterol.  EEB: On Lexapro with noted improvement in emotional reserve and work  performance. -Continue Lexapro.  Hyperlipidemia: On Atorvastatin. -Continue Atorvastatin. -Check cholesterol levels in labs.  Vitamin D Deficiency: On Vitamin D supplementation. -Continue Vitamin D supplementation. -Check Vitamin D levels in labs.  General Health Maintenance: -Attempt to incorporate safe, seated exercises into routine. -Consider strategies for managing caregiver fatigue. -Plan for urine microalbumin test.         I have personally spent 42 minutes total time today in preparation, patient care, and documentation for this visit, including the following: review of clinical lab tests; review of medical tests/procedures/services.    He was informed of the importance of frequent follow up visits to maximize his success with intensive lifestyle modifications for his multiple health conditions.    Quillian Quince, MD

## 2022-10-04 LAB — CMP14+EGFR
ALT: 20 IU/L (ref 0–44)
AST: 15 IU/L (ref 0–40)
Albumin: 4.3 g/dL (ref 3.8–4.9)
Alkaline Phosphatase: 69 IU/L (ref 44–121)
BUN/Creatinine Ratio: 17 (ref 9–20)
BUN: 13 mg/dL (ref 6–24)
Bilirubin Total: 0.4 mg/dL (ref 0.0–1.2)
CO2: 24 mmol/L (ref 20–29)
Calcium: 9.3 mg/dL (ref 8.7–10.2)
Chloride: 103 mmol/L (ref 96–106)
Creatinine, Ser: 0.78 mg/dL (ref 0.76–1.27)
Globulin, Total: 2.5 g/dL (ref 1.5–4.5)
Glucose: 188 mg/dL — ABNORMAL HIGH (ref 70–99)
Potassium: 4.8 mmol/L (ref 3.5–5.2)
Sodium: 142 mmol/L (ref 134–144)
eGFR: 107 mL/min/{1.73_m2} (ref >59)

## 2022-10-04 LAB — LIPID PANEL WITH LDL/HDL RATIO
Cholesterol, Total: 208 mg/dL — ABNORMAL HIGH (ref 100–199)
HDL: 34 mg/dL — ABNORMAL LOW (ref >39)
LDL Chol Calc (NIH): 134 mg/dL — ABNORMAL HIGH (ref 0–99)
LDL/HDL Ratio: 3.9 ratio — ABNORMAL HIGH (ref 0.0–3.6)
Triglycerides: 223 mg/dL — ABNORMAL HIGH (ref 0–149)
VLDL Cholesterol Cal: 40 mg/dL (ref 5–40)

## 2022-10-04 LAB — MICROALBUMIN / CREATININE URINE RATIO
Creatinine, Urine: 85.4 mg/dL
Microalbumin, Urine: <3 ug/mL

## 2022-10-04 LAB — VITAMIN B12

## 2022-10-04 LAB — HEMOGLOBIN A1C
Est. average glucose Bld gHb Est-mCnc: 169 mg/dL
Hgb A1c MFr Bld: 7.5 % — ABNORMAL HIGH (ref 4.8–5.6)

## 2022-10-04 LAB — VITAMIN D 25 HYDROXY (VIT D DEFICIENCY, FRACTURES): Vit D, 25-Hydroxy: 32.8 ng/mL (ref 30.0–100.0)

## 2022-10-09 ENCOUNTER — Encounter: Payer: Self-pay | Admitting: Rehabilitative and Restorative Service Providers"

## 2022-10-09 ENCOUNTER — Ambulatory Visit (INDEPENDENT_AMBULATORY_CARE_PROVIDER_SITE_OTHER): Payer: BC Managed Care – PPO | Admitting: Rehabilitative and Restorative Service Providers"

## 2022-10-09 DIAGNOSIS — R278 Other lack of coordination: Secondary | ICD-10-CM | POA: Diagnosis not present

## 2022-10-09 DIAGNOSIS — M25522 Pain in left elbow: Secondary | ICD-10-CM | POA: Diagnosis not present

## 2022-10-09 DIAGNOSIS — M25632 Stiffness of left wrist, not elsewhere classified: Secondary | ICD-10-CM | POA: Diagnosis not present

## 2022-10-09 DIAGNOSIS — M6281 Muscle weakness (generalized): Secondary | ICD-10-CM

## 2022-10-09 DIAGNOSIS — M25641 Stiffness of right hand, not elsewhere classified: Secondary | ICD-10-CM

## 2022-10-09 NOTE — Therapy (Signed)
OUTPATIENT OCCUPATIONAL THERAPY TREATMENT NOTE  Patient Name: Troy [name] MRN: [id number] DOB:July 08, 1970, 52 y.o., male Today's Date: 10/09/2022  PCP: Berniece Andreas, MD REFERRING PROVIDER:  Rodolph Bong, MD    END OF SESSION:  OT End of Session - 10/09/22 1427     Visit Number 3    Number of Visits 8    Date for OT Re-Evaluation 10/19/22    Authorization Type [employer]    OT Start Time 1428    OT Stop Time 1524    OT Time Calculation (min) 56 min    Equipment Utilized During Treatment --    Activity Tolerance Patient tolerated treatment well;Patient limited by pain;Patient limited by fatigue;Patient limited by lethargy;No increased pain    Behavior During Therapy WFL for tasks assessed/performed             Past Medical History:  Diagnosis Date   Arm weakness    right    Asthma    B12 deficiency    Back pain    Complication of anesthesia    "as a teenager , as they were waken up, I was shaking all over- they called it convulsions -it has never happen again"   Diabetes insipidus ([name])    Diabetes mellitus without complication ([name])    TYPE II   DVT (deep venous thrombosis) ([name])    right leg last time 2020   Leg edema, right    Male infertility    Neck injury 12/22/2013   Neuropathy    Obesity    Obesity    OSA (obstructive sleep apnea)    CPAP   Past Surgical History:  Procedure Laterality Date   ANTERIOR CERVICAL DECOMP/DISCECTOMY FUSION N/A 05/25/2020   Procedure: ANTERIOR CERVICAL DECOMPRESSION/DISCECTOMY FUSION, INTERBODY PROSTHESIS, PLATE/SCREWS CERVICAL FIVE- CERVICAL SIX;  Surgeon: Tressie Stalker, MD;  Location: [facility] OR;  Service: Neurosurgery;  Laterality: N/A;   BACK SURGERY     CERVICAL DISC SURGERY     C6/C7 2009   cubital tunnel left arm     2003   ELBOW SURGERY     FIBULA FRACTURE SURGERY     plate & pin removed due to infection 1997   ULNAR NERVE REPAIR     Patient Active Problem List   Diagnosis Date Noted   Caregiver with fatigue  10/03/2022   Left elbow pain 09/05/2022   BMI 40.0-44.9, adult ([name]) 05/01/2022   Obesity, Beginning BMI 40.87 05/01/2022   Chest pain, typical angina 02/28/2022   Left knee pain 01/15/2022   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   Class 2 severe obesity with serious comorbidity and body mass index (BMI) of 38.0 to 38.9 in adult [facility]) 12/19/2021   Viral URI 11/30/2021   Depression 10/31/2021   Syncope 03/23/2021   Elevated hemoglobin ([name]) 01/16/2021   Orthostatic dizzinesshypotension 11/24/2020   Mandible pain 07/13/2020   Diarrhea 05/16/2020   Cervical radiculitis 04/12/2020   Constipation 03/31/2020   Cervicalgia 02/29/2020   Radiculopathy 01/25/2020   Hyperlipidemia associated with type 2 diabetes mellitus ([name]) 12/22/2019   Vitamin D deficiency 09/08/2019   Low HDL (under 40) 09/08/2019   Pancreatic insufficiency 03/28/2018   Type 2 diabetes mellitus with complication, with long-term current use of insulin ([name]) 09/16/2017   DVT (deep venous thrombosis), right 04/28/2015   Acute deep vein thrombosis (DVT) of femoral vein of right lower extremity ([name]) 04/28/2015   Hemorrhoid 03/23/2013   Varicose vein of leg right 03/23/2013   Visit for preventive health examination 03/23/2013  Diastasis recti 12/22/2012   Tendinitis of right shoulder 09/27/2011   Right shoulder pain 09/27/2011   Allergic rhinitis, cause unspecified 05/23/2011   Sleep disturbance, unspecified 02/21/2011   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   Preventative health care 12/05/2010   Chronic diarrhea of unknown origin pt says not from metformin 12/05/2010   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   URINARY URGENCY 01/21/2009   Hyperlipidemia 01/23/2008   Obstructive sleep apnea 12/22/2007   VARICOSE VEINS, LOWER EXTREMITIES 10/03/2007   ASTHMA 09/19/2007   Mild intermittent asthma 09/19/2007   PLANTAR FASCIITIS, LEFT 06/23/2007   RASH AND OTHER NONSPECIFIC SKIN ERUPTION 05/26/2007   EDEMA 05/26/2007   Class 2  severe obesity with serious comorbidity and body mass index (BMI) of 39.0 to 39.9 in adult ([name]) 02/13/2007   DVT, HX OF 01/15/2007   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    ONSET DATE: approx 2 months ago  REFERRING DIAG:  M25.522 (ICD-10-CM) - Left elbow pain  M77.12 (ICD-10-CM) - Lateral epicondylitis of left elbow    THERAPY DIAG:  Pain in left elbow  Stiffness of left wrist, not elsewhere classified  Muscle weakness (generalized)  Other lack of coordination  Stiffness of right hand, not elsewhere classified  Rationale for Evaluation and Treatment: Rehabilitation   PERTINENT HISTORY: Per MD notes: " pt c/o L elbow pain that started after coming back from a cruise May 16th. Notes lifting an extremely heavy back into the car after travel, feels that this causes exacerbation of sx.. Pain is localized to the elbow. Unable to cold a drink in the left hand without pain. Feels like the elbow is strained. "  He states his main concern today is left elbow pain, mostly in the lateral and dorsal aspects of his elbow.  He states this started after lifting up heavy bags in a pronated position when on vacation.  He does state long and chronic history of pain and problems throughout his entire body including issues with orthostatic hypotension from diabetes, difficulty moving from seated to standing position, pain and problems in his back, respiratory issues, severe traumatic injury to the right arm about 20 years ago that involved tendon transfers and nerve damage going to the right dominant hand and arm.  Due to many severe, chronic comorbidities, a large portion of evaluation time was spent on discussion  PRECAUTIONS: Other: caution for orthostatic hypotension and hx of DVTs   RED FLAGS: None   WEIGHT BEARING RESTRICTIONS: No   SUBJECTIVE:   SUBJECTIVE STATEMENT: He states K-tape seemed to help, pain is low, exercises are tolerated    PAIN:  Are you having pain?   Yes: NPRS scale:  1/10 at rest and at worst up to 5-6/10 Pain location: Lt elbow, lateral and dorsal near triceps insertion Pain description: sharp at times, aching at others  Aggravating factors: Lifting with pronated position Relieving factors: Rest and thermal modalities.   PATIENT GOALS: To decrease pain in left arm and regain ability functional activities.  He would also like some recommendations for managing chronic issues with the right hand after polytrauma and several surgeries.   OBJECTIVE: (All objective assessments below are from initial evaluation on: 09/20/22 unless otherwise specified.)   HAND DOMINANCE: old Rt hand injury forces mostly ambidextrous use   ADLs: Overall ADLs: States decreased ability to grab, hold household objects, pain and inability to open containers  FUNCTIONAL OUTCOME MEASURES: Eval: Patient Specific Functional Scale: 4.3 (picking up tablet, drink from glass, phone)  (Higher Score  =  Better Ability for the Selected Tasks)      UPPER EXTREMITY ROM     Shoulder to Wrist AROM Right eval Left eval Lt 10/09/22  Shoulder flexion  161   Shoulder abduction     Shoulder extension  53 66  Shoulder internal rotation  40 56  Shoulder external rotation  80 73  Elbow flexion  145 pain 149  Elbow extension  (-10) (-10)  Forearm supination 74 77 70  Forearm pronation  80 85 100  Wrist flexion 60 70 80 (73* Rt)  Wrist extension 62 70 75 (75* Rt)   Wrist ulnar deviation     Wrist radial deviation     Functional dart thrower's motion (F-DTM) in ulnar flexion     F-DTM in radial extension      (Blank rows = not tested)   Hand AROM Right eval Left eval  Full Fist Ability (or Gap to Distal Palmar Crease) Full (some clawing with extended fingers) full  (Blank rows = not tested)   UPPER EXTREMITY MMT:     MMT Left 09/20/22 Lt 10/09/22  Shoulder flexion    Shoulder abduction    Shoulder adduction    Shoulder extension    Shoulder internal rotation 5/5   Shoulder  external rotation 4/5 5/5  Middle trapezius    Lower trapezius    Elbow flexion 4/5 tender to elbow 5/5  Elbow extension 4+/5 non-tender 5/5  Forearm supination 5/5   Forearm pronation 5/5   Wrist flexion 5/5   Wrist extension 4+/5 4+/5 tender to elbow  (Blank rows = not tested)  HAND FUNCTION: 10/02/22: Grip Rt: 61#(old injury), Lt: 68# (not painful- done after therapy today)  Eval: Observed weakness in affected Rt and Lt hands.  Details to be determined Grip strength Right: TBD lbs, Left: TBD lbs   COORDINATION: Eval: Observed coordination impairments with affected Rt and Lt hands.  Details to be determined as needed  SENSATION: Eval:  Light touch intact today in Lt hand, areas of absent sensation in right hand chronically.  Some complaints of nervelike shooting pain to the posterior Lt humerus and elbow.  EDEMA:   Eval: Perhaps very mildly swollen around left olecranon today.  OBSERVATIONS:   Eval: He presents fairly oddly today, in terms of left lateral epicondylitis.  Negative pain with resisted wrist extension or middle finger extension.  At times, however he does state some soreness to the elbow with wrist motion.  Biceps flexion resisted was more tender at the elbow then tricep extension, though tricep was mildly tender to palpation around the insertion sites.  Lateral epicondyle was not tender to palpation today nor was the extensor wad, however triceps and anconeus were somewhat tender.  OT feels that his presentation today is due to healing since his initial injury and also the fact that he started on a home exercise program that was initially assigned by his doctor.  OT feels that he is in the process of healing but still has some things to work out.  Ultimately, he does present like a strain to the triceps and possibly supinator with the pain that wraps around the elbow towards the anconeus muscle.  He also does seem to have some irritation to the radial nerve distribution  and tightness in the left shoulder.  For the right hand, he has these chronic issues that may benefit from a "revamped" home exercise program and possibly an anticlaw hand-based orthosis.   TODAY'S TREATMENT:  10/09/22: Due to  continued complaints about soreness and pulling pain in the left posterior elbow and triceps area, he agrees to perform manual therapy dry needling again today.  OT uses one 0.30 x 40mm needle inserted only partly near the distal triceps muscle belly near the tendinous junction to the elbow achieving excellent twitch response and almost instant relief.  He did have some mild bleeding (2 to 3 drops of blood) afterwards showing that a small vessel may have been punctured and he did have a very small (less than 1 cm) bruise and mild swelling due to that as well.  There were no more palpable muscle spasms noted in the tricep after this today.  He was unable to replicate the pain at his elbow with active finger flexion extension or wrist motion after needling.  Next, OT quickly reviews his stretch program with him which she states is helpful and he has no significant pain while doing including neuromuscular nerve gliding activity.  After that, OT educates on new strengthening to perform with his flex bar at home and forearm pronation, supination as well as wrist flexion and extension.  He was shown to do this with isometric holds of 5 seconds on a nonpainful tension and perform 5 times each of the above.  Next we also did tricep "push downs" and after 3-4 reps he was getting tender and sore to the tricep area near the needled site (site of former spasm).  OT advises him that if he ever feels a "pricking" pain or burning or has not on tolerable exercise he should rest, ice, relax for a while and consider lightly stretching again.  He should not do any strengthening before he is "warmed up and stretched out."  He is a bit sore after today's session but generally has less pain and more  difficulty achieving his typical painful response.  Exercises - Sleeper Stretch  - 2-3 x daily - 3-5 reps - 15 hold - Tricep Stretch- DO SEATED BY TABLE  - 2-3 x daily - 2-3 reps - 15 hold - Forearm Pronation Stretch  - 2-3 x daily - 3-5 reps - 15 sec hold - Wrist Prayer Stretch  - 2-3 x daily - 3-5 reps - 15 sec hold - Seated Wrist Flexion Stretch  - 2-3 x daily - 2-3 reps - 15 second  hold - Standing Radial Nerve Glide  - 2-3 x daily - 1 sets - 5-10 reps - FA pronation/supination with green Resistance Bar  - 1-3 x daily - 4-5 x weekly - 1 sets - 5 reps - 5 sec hold of each - Wrist Extension and flexion with green Resistance Bar  - 1-3 x daily - 4-5 x weekly - 1 sets - 5 reps - 5 sec hold of each - Tricep Pressdowns  - 1-3 x daily - 4-5 x weekly - 1 sets - 5-10 reps - 5 sec  hold    10/02/22: He does not appear to have done significant damage from lifting a case of water, but does have palpable muscle spasms and trigger points in the left tricep and lateral shoulder area.  With his permission and consent OT performs manual therapy dry needling with a 0.30 x 50mm needle inserted partly into the left triceps and deltoid areas trying to achieve a local twitch response.  Despite 5 attempts, no significant twitch responses found, though to palpation the shoulder feels to be less in spasm afterwards.  OT then does manual therapy IASTM to the lateral shoulder  area, triceps, lateral epicondyle area and through the extensor wad of the left forearm.  He states having no significant pain with these but feeling some tenderness that relieves after this is completed.  Next, to apply some external support and cueing, OT applies K tape along the radial nerve distribution from the triceps to the supinator area.  He states this feels beneficial to reduce strain and stress to the elbow and it will "help him remember" to not do anything heavy with the left arm.  After that, OT does some manual stretches with him with  verbal reviews of HEP stretches which she states he is very comfortable with and doing well with.  OT does some combination stretches of shoulder internal rotation with forearm pronation as well as wrist flexion with forearm pronation.  Lastly, he is educated that he could perform light isometric strengthening with a wand or a stick or such and pro and supination of the forearm as this is less aggressive than using the flex bar actively.  He leaves stating less pain now and feeling looser and also understanding the directions.   PATIENT EDUCATION: Education details: See tx section above for details  Person educated: Patient Education method: Verbal Instruction, Teach back, Handouts  Education comprehension: States and demonstrates understanding, Additional Education required    HOME EXERCISE PROGRAM: Access Code: [email or web address] URL: [email or web address]    GOALS: Goals reviewed with patient? Yes   SHORT TERM GOALS: (STG required if POC>30 days) Target Date: 10/05/22  Pt will obtain protective, custom orthotic. Goal status: TBD/PRN  2.  Pt will demo/state understanding of initial HEP to improve pain levels and prerequisite motion. Goal status: 10/02/22: MET   LONG TERM GOALS: Target Date: 10/19/22  Pt will improve functional ability by decreased impairment per PSFS assessment from 4.3 to 7.5 or better, for better quality of life. Goal status: INITIAL  2.  Pt will improve grip strength in Lt hand to at least 45lbs for functional use at home and in IADLs. Goal status: INITIAL- TBD baseline next session  3.  Pt will improve A/ROM in Lt sh IR from 40* to at least 55*, to have functional motion for tasks like reach and grasp.  Goal status: INITIAL  4.  Pt will improve strength in Lt elbow flexion from tender 4/5 MMT to at least non-tender 5/5 MMT to have increased functional ability to carry out selfcare and higher-level homecare tasks with no difficulty. Goal status:  INITIAL  5. Pt will decrease pain at worst from 6-8/10 to 2-3/10 or better to have better sleep and occupational participation in daily roles. Goal status: INITIAL  ASSESSMENT:  CLINICAL IMPRESSION: 10/09/22: Today needling seem very effective to reduce all his pain at the left elbow/triceps with any hand and wrist motion in the left arm.  He did get sore by the end of the session after multiple exercises were performed so he was advised to do progressively and cautiously stopping if he ever got tender and using ice to rest at the end if necessary.  Carry on   PLAN:  OT FREQUENCY: 1-2x/week  OT DURATION: 4 weeks  PLANNED INTERVENTIONS: self care/ADL training, therapeutic exercise, therapeutic activity, neuromuscular re-education, manual therapy, scar mobilization, passive range of motion, splinting, electrical stimulation, ultrasound, compression bandaging, moist heat, cryotherapy, contrast bath, patient/family education, energy conservation, coping strategies training, DME and/or AE instructions, Re-evaluation, and Dry needling  CONSULTED AND AGREED WITH PLAN OF CARE: Patient  PLAN FOR NEXT SESSION:   Check on  symptoms and pain as well as motion and strength and review new strengthening at the forearm, wrist, tricep.  Make this more progressive as he can tolerate but be aware of his many comorbidities and issues that could be limiting factors.  Fannie Knee, OTR/L  10/09/2022, 6:26 PM

## 2022-10-16 ENCOUNTER — Encounter: Payer: Self-pay | Admitting: Rehabilitative and Restorative Service Providers"

## 2022-10-16 ENCOUNTER — Ambulatory Visit: Payer: BC Managed Care – PPO | Admitting: Rehabilitative and Restorative Service Providers"

## 2022-10-16 DIAGNOSIS — R278 Other lack of coordination: Secondary | ICD-10-CM

## 2022-10-16 DIAGNOSIS — M25522 Pain in left elbow: Secondary | ICD-10-CM

## 2022-10-16 DIAGNOSIS — M25641 Stiffness of right hand, not elsewhere classified: Secondary | ICD-10-CM | POA: Diagnosis not present

## 2022-10-16 DIAGNOSIS — M25632 Stiffness of left wrist, not elsewhere classified: Secondary | ICD-10-CM

## 2022-10-16 DIAGNOSIS — M6281 Muscle weakness (generalized): Secondary | ICD-10-CM | POA: Diagnosis not present

## 2022-10-16 NOTE — Therapy (Signed)
OUTPATIENT OCCUPATIONAL THERAPY TREATMENT & DISCHARGE NOTE  Patient Name: Troy [name] MRN: [id number] DOB:07-27-1970, 52 y.o., male Today's Date: 10/16/2022  PCP: Berniece Andreas, MD REFERRING PROVIDER:  Rodolph Bong, MD      OCCUPATIONAL THERAPY DISCHARGE SUMMARY  Visits from Start of Care: 4  Current functional level related to goals / functional outcomes: Pt has met all goals and is pleased with outcomes.   Remaining deficits: Pt has no more significant functional deficits or pain.   Education / Equipment: Pt has all needed materials and education. Pt understands how to continue on with self-management. See tx notes for more details.   Patient agrees to discharge due to max benefits received from outpatient occupational therapy / hand therapy at this time.   Fannie Knee, OTR/L, CHT 10/16/22     END OF SESSION:  OT End of Session - 10/16/22 1603     Visit Number 4    Number of Visits 8    Date for OT Re-Evaluation 10/19/22    Authorization Type [employer]    OT Start Time 1603    OT Stop Time 1636    OT Time Calculation (min) 33 min    Activity Tolerance Patient tolerated treatment well;No increased pain    Behavior During Therapy WFL for tasks assessed/performed              Past Medical History:  Diagnosis Date   Arm weakness    right    Asthma    B12 deficiency    Back pain    Complication of anesthesia    "as a teenager , as they were waken up, I was shaking all over- they called it convulsions -it has never happen again"   Diabetes insipidus ([name])    Diabetes mellitus without complication ([name])    TYPE II   DVT (deep venous thrombosis) ([name])    right leg last time 2020   Leg edema, right    Male infertility    Neck injury 12/22/2013   Neuropathy    Obesity    Obesity    OSA (obstructive sleep apnea)    CPAP   Past Surgical History:  Procedure Laterality Date   ANTERIOR CERVICAL DECOMP/DISCECTOMY FUSION N/A 05/25/2020   Procedure:  ANTERIOR CERVICAL DECOMPRESSION/DISCECTOMY FUSION, INTERBODY PROSTHESIS, PLATE/SCREWS CERVICAL FIVE- CERVICAL SIX;  Surgeon: Tressie Stalker, MD;  Location: [facility] OR;  Service: Neurosurgery;  Laterality: N/A;   BACK SURGERY     CERVICAL DISC SURGERY     C6/C7 2009   cubital tunnel left arm     2003   ELBOW SURGERY     FIBULA FRACTURE SURGERY     plate & pin removed due to infection 1997   ULNAR NERVE REPAIR     Patient Active Problem List   Diagnosis Date Noted   Caregiver with fatigue 10/03/2022   Left elbow pain 09/05/2022   BMI 40.0-44.9, adult ([name]) 05/01/2022   Obesity, Beginning BMI 40.87 05/01/2022   Chest pain, typical angina 02/28/2022   Left knee pain 01/15/2022   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   Class 2 severe obesity with serious comorbidity and body mass index (BMI) of 38.0 to 38.9 in adult [facility]) 12/19/2021   Viral URI 11/30/2021   Depression 10/31/2021   Syncope 03/23/2021   Elevated hemoglobin ([name]) 01/16/2021   Orthostatic dizzinesshypotension 11/24/2020   Mandible pain 07/13/2020   Diarrhea 05/16/2020   Cervical radiculitis 04/12/2020   Constipation 03/31/2020   Cervicalgia 02/29/2020   Radiculopathy 01/25/2020  Hyperlipidemia associated with type 2 diabetes mellitus ([name]) 12/22/2019   Vitamin D deficiency 09/08/2019   Low HDL (under 40) 09/08/2019   Pancreatic insufficiency 03/28/2018   Type 2 diabetes mellitus with complication, with long-term current use of insulin ([name]) 09/16/2017   DVT (deep venous thrombosis), right 04/28/2015   Acute deep vein thrombosis (DVT) of femoral vein of right lower extremity ([name]) 04/28/2015   Hemorrhoid 03/23/2013   Varicose vein of leg right 03/23/2013   Visit for preventive health examination 03/23/2013   Diastasis recti 12/22/2012   Tendinitis of right shoulder 09/27/2011   Right shoulder pain 09/27/2011   Allergic rhinitis, cause unspecified 05/23/2011   Sleep disturbance, unspecified 02/21/2011   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   Preventative health care 12/05/2010   Chronic diarrhea of unknown origin pt says not from metformin 12/05/2010   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   URINARY URGENCY 01/21/2009   Hyperlipidemia 01/23/2008   Obstructive sleep apnea 12/22/2007   VARICOSE VEINS, LOWER EXTREMITIES 10/03/2007   ASTHMA 09/19/2007   Mild intermittent asthma 09/19/2007   PLANTAR FASCIITIS, LEFT 06/23/2007   RASH AND OTHER NONSPECIFIC SKIN ERUPTION 05/26/2007   EDEMA 05/26/2007   Class 2 severe obesity with serious comorbidity and body mass index (BMI) of 39.0 to 39.9 in adult ([name]) 02/13/2007   DVT, HX OF 01/15/2007   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    ONSET DATE: approx 2 months ago  REFERRING DIAG:  M25.522 (ICD-10-CM) - Left elbow pain  M77.12 (ICD-10-CM) - Lateral epicondylitis of left elbow    THERAPY DIAG:  Stiffness of left wrist, not elsewhere classified  Muscle weakness (generalized)  Stiffness of right hand, not elsewhere classified  Pain in left elbow  Other lack of coordination  Rationale for Evaluation and Treatment: Rehabilitation   PERTINENT HISTORY: Per MD notes: " pt c/o L elbow pain that started after coming back from a cruise May 16th. Notes lifting an extremely heavy back into the car after travel, feels that this causes exacerbation of sx.. Pain is localized to the elbow. Unable to cold a drink in the left hand without pain. Feels like the elbow is strained. "  He states his main concern today is left elbow pain, mostly in the lateral and dorsal aspects of his elbow.  He states this started after lifting up heavy bags in a pronated position when on vacation.  He does state long and chronic history of pain and problems throughout his entire body including issues with orthostatic hypotension from diabetes, difficulty moving from seated to standing position, pain and problems in his back, respiratory issues, severe traumatic injury to the right arm about 20  years ago that involved tendon transfers and nerve damage going to the right dominant hand and arm.  Due to many severe, chronic comorbidities, a large portion of evaluation time was spent on discussion  PRECAUTIONS: Other: caution for orthostatic hypotension and hx of DVTs   RED FLAGS: None   WEIGHT BEARING RESTRICTIONS: No   SUBJECTIVE:   SUBJECTIVE STATEMENT: He states having no new pain or problems, feels like he is meeting all of his goals and doing much better now.    PAIN:  Are you having pain?   Yes: NPRS scale: 1/10 at rest and at worst up to 2-3/10 Pain location: Lt elbow, lateral and dorsal near triceps insertion Pain description: sharp at times, aching at others  Aggravating factors: Lifting with pronated position Relieving factors: Rest and thermal modalities.   PATIENT GOALS:  To decrease pain in left arm and regain ability functional activities.  He would also like some recommendations for managing chronic issues with the right hand after polytrauma and several surgeries.   OBJECTIVE: (All objective assessments below are from initial evaluation on: 09/20/22 unless otherwise specified.)   HAND DOMINANCE: old Rt hand injury forces mostly ambidextrous use   ADLs: Overall ADLs: States no significant ADL issues at this point  FUNCTIONAL OUTCOME MEASURES: 10/16/22: PSFS: 9.6 today   Eval: Patient Specific Functional Scale: 4.3 (picking up tablet, drink from glass, phone)  (Higher Score  =  Better Ability for the Selected Tasks)      UPPER EXTREMITY ROM     Shoulder to Wrist AROM Right eval Left eval Lt 10/09/22 Lt 10/16/22  Shoulder flexion  161    Shoulder abduction      Shoulder extension  53 66 61  Shoulder internal rotation  40 56 52  Shoulder external rotation  80 73 90  Elbow flexion  145 pain 149 145  Elbow extension  (-10) (-10) (-4)  Forearm supination 74 77 70 78  Forearm pronation  80 85 100 85  Wrist flexion 60 70 80 (73* Rt) 75  Wrist extension  62 70 75 (75* Rt)  65  (Blank rows = not tested)   Hand AROM Right eval Left eval  Full Fist Ability (or Gap to Distal Palmar Crease) Full (some clawing with extended fingers) full  (Blank rows = not tested)   UPPER EXTREMITY MMT:     MMT Left 09/20/22 Lt 10/09/22 Lt 10/16/22  Shoulder flexion     Shoulder abduction     Shoulder adduction     Shoulder extension     Shoulder internal rotation 5/5    Shoulder external rotation 4/5 5/5   Middle trapezius     Lower trapezius     Elbow flexion 4/5 tender to elbow 5/5   Elbow extension 4+/5 non-tender 5/5   Forearm supination 5/5    Forearm pronation 5/5    Wrist flexion 5/5    Wrist extension 4+/5 4+/5 tender to elbow 5/5 MMT  (Blank rows = not tested)  HAND FUNCTION: 10/16/22: Grip Rt: 66#(presence of old injury), Lt: 77#   10/02/22: Grip Rt: 61#(old injury), Lt: 68# (not painful- done after therapy today)  Eval: Observed weakness in affected Rt and Lt hands.  Details to be determined Grip strength Right: TBD lbs, Left: TBD lbs   COORDINATION: 10/16/22- no issues now   OBSERVATIONS:   10/16/22: No significant tenderness, pain, swelling, etc. to the left elbow or triceps area.  Only mildly tight through the tricep muscle now compared to previously.  TODAY'S TREATMENT:  10/16/22: He performs active range of motion, gripping, review of home and functional activities with OT for exercises and activities as well as new measures of progress today.  He rates his abilities as nearly 100% better, OT discusses with him how to wean from his home exercise program into a more manageable format, as long as he is not having any exacerbations.  All of these exercises are reviewed with him today.  New strengthening with the flex bar has not been irritating either.  He was cautioned that if he quits his exercises "cold [place]" his symptoms are likely to return and linger.   "OT Final Recommendations  on 10/16/2022 Continue home exercises 2-3 x day. in  1-2 weeks, ween down to 1-2xday.. Then try to ween to as needed (this may be  1x day or 3-5 x week, etc.) If symptoms start to return- you aren't "balancing" well.  Use these "tools" as needed to improve your life and symptoms- as they have been working."    Exercises Reviewed  - Engineer, building services  - 2-3 x daily - 3-5 reps - 15 hold - Tricep Stretch- DO SEATED BY TABLE  - 2-3 x daily - 2-3 reps - 15 hold - Forearm Pronation Stretch  - 2-3 x daily - 3-5 reps - 15 sec hold - Wrist Prayer Stretch  - 2-3 x daily - 3-5 reps - 15 sec hold - Seated Wrist Flexion Stretch  - 2-3 x daily - 2-3 reps - 15 second  hold - Standing Radial Nerve Glide  - 2-3 x daily - 1 sets - 5-10 reps - FA pronation/supination with green Resistance Bar  - 1-3 x daily - 4-5 x weekly - 1 sets - 5 reps - 5 sec hold of each - Wrist Extension and flexion with green Resistance Bar  - 1-3 x daily - 4-5 x weekly - 1 sets - 5 reps - 5 sec hold of each - Tricep Pressdowns  - 1-3 x daily - 4-5 x weekly - 1 sets - 5-10 reps - 5 sec  hold    PATIENT EDUCATION: Education details: See tx section above for details  Person educated: Patient Education method: Verbal Instruction, Teach back, Handouts  Education comprehension: States and demonstrates understanding   HOME EXERCISE PROGRAM: Access Code: [email or web address] URL: [email or web address]    GOALS: Goals reviewed with patient? Yes   SHORT TERM GOALS: (STG required if POC>30 days) Target Date: 10/05/22  Pt will obtain protective, custom orthotic. Goal status: D/C  2.  Pt will demo/state understanding of initial HEP to improve pain levels and prerequisite motion. Goal status: 10/02/22: MET   LONG TERM GOALS: Target Date: 10/19/22  Pt will improve functional ability by decreased impairment per PSFS assessment from 4.3 to 7.5 or better, for better quality of life. Goal status: 10/16/22: MET! >9 today  2.  Pt will improve grip strength in Lt hand to at least 45lbs for  functional use at home and in IADLs. Goal status: 10/16/22: MET now 77#  3.  Pt will improve A/ROM in Lt sh IR from 40* to at least 55*, to have functional motion for tasks like reach and grasp.  Goal status: 10/16/22: Previous met @ 56*, today 52* and considered MET   4.  Pt will improve strength in Lt elbow flexion from tender 4/5 MMT to at least non-tender 5/5 MMT to have increased functional ability to carry out selfcare and higher-level homecare tasks with no difficulty. Goal status: 10/16/22: 5/5 Met in Lt arm  5. Pt will decrease pain at worst from 6-8/10 to 2-3/10 or better to have better sleep and occupational participation in daily roles. Goal status: 10/16/22: MET 2-3/10 at worst now!  ASSESSMENT:  CLINICAL IMPRESSION: 10/16/22: No more significant pain or functional deficits, all goals are met, he states understanding how to continue on with self-management and will discharge today.   PLAN:  OT FREQUENCY & OT DURATION: D/C  PLANNED INTERVENTIONS: self care/ADL training, therapeutic exercise, therapeutic activity, neuromuscular re-education, manual therapy, scar mobilization, passive range of motion, splinting, electrical stimulation, ultrasound, compression bandaging, moist heat, cryotherapy, contrast bath, patient/family education, energy conservation, coping strategies training, DME and/or AE instructions, Re-evaluation, and Dry needling  CONSULTED AND AGREED WITH PLAN OF CARE: Patient  PLAN FOR NEXT SESSION:   D/C  now   Fannie Knee, OTR/L  10/16/2022, 5:03 PM

## 2022-10-18 DIAGNOSIS — H40013 Open angle with borderline findings, low risk, bilateral: Secondary | ICD-10-CM | POA: Diagnosis not present

## 2022-10-23 ENCOUNTER — Other Ambulatory Visit: Payer: Self-pay | Admitting: Internal Medicine

## 2022-10-23 ENCOUNTER — Encounter: Payer: Self-pay | Admitting: Cardiology

## 2022-10-23 ENCOUNTER — Ambulatory Visit: Payer: BC Managed Care – PPO | Admitting: Cardiology

## 2022-10-23 VITALS — BP 132/82 | HR 80 | Resp 16 | Ht 70.0 in | Wt 299.4 lb

## 2022-10-23 DIAGNOSIS — E7800 Pure hypercholesterolemia, unspecified: Secondary | ICD-10-CM

## 2022-10-23 DIAGNOSIS — E781 Pure hyperglyceridemia: Secondary | ICD-10-CM

## 2022-10-23 DIAGNOSIS — I951 Orthostatic hypotension: Secondary | ICD-10-CM | POA: Diagnosis not present

## 2022-10-23 DIAGNOSIS — Z794 Long term (current) use of insulin: Secondary | ICD-10-CM

## 2022-10-23 DIAGNOSIS — G4733 Obstructive sleep apnea (adult) (pediatric): Secondary | ICD-10-CM | POA: Diagnosis not present

## 2022-10-23 DIAGNOSIS — R072 Precordial pain: Secondary | ICD-10-CM | POA: Diagnosis not present

## 2022-10-23 NOTE — Progress Notes (Signed)
ID:  Troy [name], DOB [birth year], MRN [id number]  PCP:  Madelin Headings, MD  Cardiologist:  Tessa Lerner, DO, [facility] (established care 10/23/22) Former Cardiology Providers: Dr. Clotilde Dieter  Date: 10/23/22 Last Office Visit: April 24, 2022  Chief Complaint  Patient presents with   Follow-up    73-month follow-up for reevaluation of chest pain.    HPI  Troy [name] is a 52 y.o. Caucasian male whose past medical history and cardiovascular risk factors include: Orthostatic Hypotension, hyperlipidemia, IDDM Type II, recurrent DVTs, sleep apnea on CPAP.   Patient presents today for 57-month follow-up visit for reevaluation of chest pain.  He has some nonspecific anterior precordial discomfort and right-sided pain that comes intermittently.  Has been told that he has pleurisy.  The pain is not brought on by effort related activities, does not resolve with rest.  His pain is much less in intensity compared to in January 2024 when he had a stress test.  Overall intensity frequency and duration has not worsened since last office visit.  Given his orthostatic hypotension he follows up with a dysautonomia specialist in [place] and has an appointment with the provider in November 2024.  He continues to wear lymphedema brace on the right lower extremity.  And tries his best to remember wearing the compression stocking on the left lower extremity.  His lipids are not well-controlled.  Patient states that his atorvastatin was not refilled and he just restarted the medication.  FUNCTIONAL STATUS: No structured exercise program or daily routine.   CARDIAC DATABASE: EKG: 10/23/2022: Sinus rhythm, 75 bpm, without underlying ischemia injury pattern.  Echocardiogram: 03/20/2022: Left ventricle cavity is normal in size and wall thickness. Normal global wall motion. Normal LV systolic function with EF 59%. Normal diastolic filling pattern. No significant valvular abnormality. Normal right atrial  pressure.   Stress Testing: Exercise nuclear stress test 03/19/2022: Myocardial perfusion is normal. Overall LV systolic function is normal without regional wall motion abnormalities. Stress LV EF: 49%, however, visually appears to be normal. Normal ECG stress. The patient exercised for 6 minutes and 3 seconds of a Bruce protocol, achieving approximately 7.12 MET & 87% MPHR. No chest pain. Normal BP response. No previous exam available for comparison. Low risk.    ALLERGIES: No Known Allergies  MEDICATION LIST PRIOR TO VISIT: Current Meds  Medication Sig   albuterol (PROVENTIL HFA;VENTOLIN HFA) 108 (90 Base) MCG/ACT inhaler Inhale 2 puffs into the lungs every 6 (six) hours as needed. For wheezing   apixaban (ELIQUIS) 5 MG TABS tablet TAKE 1 TABLET(5 MG) BY MOUTH TWICE DAILY   atorvastatin (LIPITOR) 40 MG tablet TAKE 1 TABLET DAILY   cholecalciferol (VITAMIN D3) 25 MCG (1000 UNIT) tablet Take 1,000 Units by mouth daily.   Continuous Blood Gluc Sensor (DEXCOM G7 SENSOR) MISC 3 each by Does not apply route every 30 (thirty) days. Apply 1 sensor every 10 days   empagliflozin (JARDIANCE) 25 MG TABS tablet TAKE 1 TABLET BY MOUTH EVERY DAY BEFORE BREAKFAST   escitalopram (LEXAPRO) 20 MG tablet Take 1 tablet (20 mg total) by mouth daily.   insulin aspart (NOVOLOG) 100 UNIT/ML injection Use 80 units a day in the insulin pump   Insulin Disposable Pump (V-GO 30) 30 UNIT/24HR KIT Inject 1 each into the skin daily.   metFORMIN (GLUCOPHAGE-XR) 500 MG 24 hr tablet TAKE 4 TABLETS DAILY WITH BREAKFAST   midodrine (PROAMATINE) 5 MG tablet Take 5 mg by mouth 2 (two) times daily.  nitroGLYCERIN (NITROSTAT) 0.4 MG SL tablet Place 1 tablet (0.4 mg total) under the tongue every 5 (five) minutes as needed for chest pain.   Semaglutide, 2 MG/DOSE, (OZEMPIC, 2 MG/DOSE,) 8 MG/3ML SOPN Inject 2 mg into the skin once a week.   SODIUM CHLORIDE PO Take 4 g by mouth.     PAST MEDICAL HISTORY: Past Medical  History:  Diagnosis Date   Arm weakness    right    Asthma    B12 deficiency    Back pain    Complication of anesthesia    "as a teenager , as they were waken up, I was shaking all over- they called it convulsions -it has never happen again"   Diabetes insipidus ([name])    Diabetes mellitus without complication ([name])    TYPE II   DVT (deep venous thrombosis) ([name])    right leg last time 2020   Leg edema, right    Male infertility    Neck injury 12/22/2013   Neuropathy    Obesity    Obesity    OSA (obstructive sleep apnea)    CPAP    PAST SURGICAL HISTORY: Past Surgical History:  Procedure Laterality Date   ANTERIOR CERVICAL DECOMP/DISCECTOMY FUSION N/A 05/25/2020   Procedure: ANTERIOR CERVICAL DECOMPRESSION/DISCECTOMY FUSION, INTERBODY PROSTHESIS, PLATE/SCREWS CERVICAL FIVE- CERVICAL SIX;  Surgeon: Tressie Stalker, MD;  Location: [facility] OR;  Service: Neurosurgery;  Laterality: N/A;   BACK SURGERY     CERVICAL DISC SURGERY     C6/C7 2009   cubital tunnel left arm     2003   ELBOW SURGERY     FIBULA FRACTURE SURGERY     plate & pin removed due to infection 1997   ULNAR NERVE REPAIR      FAMILY HISTORY: The patient family history includes Alcoholism in his father; Allergies in an other family member; Aneurysm in his father; Deep vein thrombosis in an other family member; Depression in his mother; Diabetes in his mother; Heart disease in his mother; High blood pressure in his father and mother; Hyperlipidemia in his father; Hypertension in an other family member; Kidney disease in his mother; Obesity in his mother and another family member; Other in his father and mother; Sleep apnea in his mother and another family member; Sudden death in his father.  SOCIAL HISTORY:  The patient  reports that he has never smoked. He has never used smokeless tobacco. He reports current alcohol use. He reports that he does not use drugs.  REVIEW OF SYSTEMS: Review of Systems  Cardiovascular:   Positive for chest pain (see HPI) and leg swelling. Negative for claudication, dyspnea on exertion, irregular heartbeat, near-syncope, orthopnea, palpitations, paroxysmal nocturnal dyspnea and syncope.  Respiratory:  Negative for shortness of breath.   Hematologic/Lymphatic: Negative for bleeding problem.  Musculoskeletal:  Negative for muscle cramps and myalgias.  Neurological:  Negative for dizziness and light-headedness.    PHYSICAL EXAM:    10/23/2022    9:16 AM 10/03/2022    7:00 AM 09/06/2022    2:58 PM  Vitals with BMI  Height 5\' 10"  5\' 10"  5\' 11"   Weight 299 lbs 6 oz 293 lbs 300 lbs  BMI 42.96 42.04 41.86  Systolic 132 107 161  Diastolic 82 69 78  Pulse 80 85 92   Orthostatic VS for the past 72 hrs (Last 3 readings):  Orthostatic BP Patient Position BP Location Cuff Size Orthostatic Pulse  10/23/22 0928 98/60 Standing Left Arm Large 90  10/23/22 0927  108/72 Sitting Left Arm Normal 89  10/23/22 0926 124/77 Supine Left Arm Large 77   Physical Exam  Constitutional: No distress.  Age appropriate, hemodynamically stable.   Neck: No JVD present.  Cardiovascular: Normal rate, regular rhythm, S1 normal, S2 normal, intact distal pulses and normal pulses. Exam reveals no gallop, no S3 and no S4.  No murmur heard. Pulmonary/Chest: Effort normal and breath sounds normal. No stridor. He has no wheezes. He has no rales.  Abdominal: Soft. Bowel sounds are normal. He exhibits no distension. There is no abdominal tenderness.  Musculoskeletal:        General: Edema (Lymphedema stocking in the right lower extremity) present.     Cervical back: Neck supple.  Neurological: He is alert and oriented to person, place, and time. He has intact cranial nerves (2-12).  Skin: Skin is warm and moist.    LABORATORY DATA:    Latest Ref Rng & Units 01/31/2022   10:42 AM 10/31/2020    3:06 PM 05/25/2020   12:40 PM  CBC  WBC 4.0 - 10.5 K/uL 8.6  9.6  10.3   Hemoglobin 13.0 - 17.0 g/dL 60.4  54.0   98.1   Hematocrit 39.0 - 52.0 % 49.1  47.1  48.3   Platelets 150.0 - 400.0 K/uL 239.0  248  208        Latest Ref Rng & Units 10/03/2022    7:50 AM 01/31/2022   10:42 AM 03/23/2021    8:02 AM  CMP  Glucose 70 - 99 mg/dL 191  478  295   BUN 6 - 24 mg/dL 13  14  17    Creatinine 0.76 - 1.27 mg/dL 6.21  3.08  6.57   Sodium 134 - 144 mmol/L 142  137  141   Potassium 3.5 - 5.2 mmol/L 4.8  4.4  4.4   Chloride 96 - 106 mmol/L 103  101  105   CO2 20 - 29 mmol/L 24  27  24    Calcium 8.7 - 10.2 mg/dL 9.3  9.0  9.4   Total Protein 6.0 - 8.5 g/dL 6.8  6.9  6.8   Total Bilirubin 0.0 - 1.2 mg/dL 0.4  0.8  0.7   Alkaline Phos 44 - 121 IU/L 69  59  70   AST 0 - 40 IU/L 15  15  15    ALT 0 - 44 IU/L 20  23  23      Lab Results  Component Value Date   CHOL 208 (H) 10/03/2022   HDL 34 (L) 10/03/2022   LDLCALC 134 (H) 10/03/2022   LDLDIRECT 125.0 12/26/2018   TRIG 223 (H) 10/03/2022   CHOLHDL 4 01/31/2022    No components found for: "NTPROBNP" Recent Labs    01/31/22 1042  PROBNP 11.0   Recent Labs    01/31/22 1042  TSH 0.82    BMP Recent Labs    01/31/22 1042 10/03/22 0750  NA 137 142  K 4.4 4.8  CL 101 103  CO2 27 24  GLUCOSE 163* 188*  BUN 14 13  CREATININE 0.82 0.78  CALCIUM 9.0 9.3    HEMOGLOBIN A1C Lab Results  Component Value Date   HGBA1C 7.5 (H) 10/03/2022   MPG 145.59 05/25/2020    IMPRESSION:    ICD-10-CM   1. Precordial pain  R07.2     2. OSA (obstructive sleep apnea)  G47.33 EKG 12-Lead    3. Orthostatic hypotension  I95.1     4. Pure hypercholesterolemia  E78.00     5. Pure hypertriglyceridemia  E78.1     6. Type 2 diabetes mellitus with complication, with long-term current use of insulin ([name])  E11.8    Z79.4     7. Class 3 severe obesity due to excess calories with serious comorbidity and body mass index (BMI) of 40.0 to 44.9 in adult [facility])  E66.01    Z68.41        RECOMMENDATIONS: Troy [name] is a 52 y.o. Caucasian male whose  past medical history and cardiac risk factors include: Orthostatic Hypotension, hyperlipidemia, IDDM Type II, recurrent DVTs, sleep apnea on CPAP.   Precordial pain Predominately noncardiac based on symptoms. EKG is nonischemic Overall intensity frequency and duration have improved since January 2024 No use of sublingual nitroglycerin tablets since the last office visit. Prior echo and stress test results reviewed. Reemphasized importance of improving his modifiable cardiovascular risk factors.  Orthostatic hypotension Sees a dysautonomia specialist in [facility]. Currently on salt tablets as well as midodrine. Orthostatic vital signs are positive Reemphasized importance of fall precautions.  Pure hypercholesterolemia Pure hypertriglyceridemia Most recent lipid profile notes uncontrolled hyperlipidemia and hypertriglyceridemia. Reemphasized importance of better lipid management. Recommend a goal LDL of at least <70 mg/dL. Recommend a goal triglycerides of at least <149 mg/dL Patient states that this is likely secondary to pharmacy not refilling his atorvastatin. Will have a repeat lipids with PCP.  Type 2 diabetes mellitus with complication, with long-term current use of insulin ([name]) Currently on insulin therapy. Not on ACE inhibitors or ARB secondary to orthostatic hypotension. Continue statin therapy. Currently on Ozempic.  Class 3 severe obesity due to excess calories with serious comorbidity and body mass index (BMI) of 40.0 to 44.9 in adult [facility]) Body mass index is 42.96 kg/m. I reviewed with him importance of diet, regular physical activity/exercise, weight loss.   Patient is educated on the importance of increasing physical activity gradually as tolerated with a goal of moderate intensity exercise for 30 minutes a day 5 days a week.  FINAL MEDICATION LIST END OF ENCOUNTER: No orders of the defined types were placed in this encounter.   There are no  discontinued medications.   Current Outpatient Medications:    albuterol (PROVENTIL HFA;VENTOLIN HFA) 108 (90 Base) MCG/ACT inhaler, Inhale 2 puffs into the lungs every 6 (six) hours as needed. For wheezing, Disp: 1 Inhaler, Rfl: 2   apixaban (ELIQUIS) 5 MG TABS tablet, TAKE 1 TABLET(5 MG) BY MOUTH TWICE DAILY, Disp: 180 tablet, Rfl: 2   atorvastatin (LIPITOR) 40 MG tablet, TAKE 1 TABLET DAILY, Disp: 90 tablet, Rfl: 2   cholecalciferol (VITAMIN D3) 25 MCG (1000 UNIT) tablet, Take 1,000 Units by mouth daily., Disp: , Rfl:    Continuous Blood Gluc Sensor (DEXCOM G7 SENSOR) MISC, 3 each by Does not apply route every 30 (thirty) days. Apply 1 sensor every 10 days, Disp: 9 each, Rfl: 4   empagliflozin (JARDIANCE) 25 MG TABS tablet, TAKE 1 TABLET BY MOUTH EVERY DAY BEFORE BREAKFAST, Disp: 90 tablet, Rfl: 0   escitalopram (LEXAPRO) 20 MG tablet, Take 1 tablet (20 mg total) by mouth daily., Disp: 90 tablet, Rfl: 0   insulin aspart (NOVOLOG) 100 UNIT/ML injection, Use 80 units a day in the insulin pump, Disp: 90 mL, Rfl: 3   Insulin Disposable Pump (V-GO 30) 30 UNIT/24HR KIT, Inject 1 each into the skin daily., Disp: 90 kit, Rfl: 3   metFORMIN (GLUCOPHAGE-XR) 500 MG 24 hr tablet, TAKE 4 TABLETS  DAILY WITH BREAKFAST, Disp: 360 tablet, Rfl: 3   midodrine (PROAMATINE) 5 MG tablet, Take 5 mg by mouth 2 (two) times daily., Disp: , Rfl:    nitroGLYCERIN (NITROSTAT) 0.4 MG SL tablet, Place 1 tablet (0.4 mg total) under the tongue every 5 (five) minutes as needed for chest pain., Disp: 90 tablet, Rfl: 3   Semaglutide, 2 MG/DOSE, (OZEMPIC, 2 MG/DOSE,) 8 MG/3ML SOPN, Inject 2 mg into the skin once a week., Disp: 9 mL, Rfl: 3   SODIUM CHLORIDE PO, Take 4 g by mouth., Disp: , Rfl:   Orders Placed This Encounter  Procedures   EKG 12-Lead    There are no Patient Instructions on file for this visit.   --Continue cardiac medications as reconciled in final medication list. --Return in about 6 months (around  04/25/2023) for Follow up. or sooner if needed. --Continue follow-up with your primary care physician regarding the management of your other chronic comorbid conditions.  Patient's questions and concerns were addressed to his satisfaction. He voices understanding of the instructions provided during this encounter.   This note was created using a voice recognition software as a result there may be grammatical errors inadvertently enclosed that do not reflect the nature of this encounter. Every attempt is made to correct such errors.  Tessa Lerner, [place], [facility]  Pager:  [phone] Office: [phone]

## 2022-10-24 ENCOUNTER — Encounter (INDEPENDENT_AMBULATORY_CARE_PROVIDER_SITE_OTHER): Payer: Self-pay | Admitting: Family Medicine

## 2022-10-24 ENCOUNTER — Ambulatory Visit (INDEPENDENT_AMBULATORY_CARE_PROVIDER_SITE_OTHER): Payer: BC Managed Care – PPO | Admitting: Family Medicine

## 2022-10-24 VITALS — BP 100/66 | HR 97 | Temp 97.6°F | Ht 70.0 in | Wt 290.0 lb

## 2022-10-24 DIAGNOSIS — Z6841 Body Mass Index (BMI) 40.0 and over, adult: Secondary | ICD-10-CM | POA: Diagnosis not present

## 2022-10-24 DIAGNOSIS — G4733 Obstructive sleep apnea (adult) (pediatric): Secondary | ICD-10-CM

## 2022-10-24 DIAGNOSIS — E118 Type 2 diabetes mellitus with unspecified complications: Secondary | ICD-10-CM

## 2022-10-24 DIAGNOSIS — Z7985 Long-term (current) use of injectable non-insulin antidiabetic drugs: Secondary | ICD-10-CM

## 2022-10-24 DIAGNOSIS — E669 Obesity, unspecified: Secondary | ICD-10-CM | POA: Diagnosis not present

## 2022-10-24 DIAGNOSIS — Z794 Long term (current) use of insulin: Secondary | ICD-10-CM

## 2022-10-29 NOTE — Progress Notes (Unsigned)
Chief Complaint:   OBESITY Troy [name] is here to discuss his progress with his obesity treatment plan along with follow-up of his obesity related diagnoses. Troy [name] is on the Category 3 Plan and states he is following his eating plan approximately 60% of the time. Troy [name] states he is doing 0 minutes 0 times per week.  Today's visit was #: 39 Starting weight: 293 lbs Starting date: 09/07/2019 Today's weight: 290 lbs Today's date: 10/24/2022 Total lbs lost to date: 3 Total lbs lost since last in-office visit: 3  Interim History: Patient has done well with his weight loss. He has had other health issues but he has tried to increase his protein and water. He is doing physical therapy at home. His exercise is limited.   Subjective:   1. Type 2 diabetes mellitus with complication, with long-term current use of insulin ([name]) Patient is on Ozempic, insulin (Novolog), and Jardiance. We have prescribed Ozempic. His glucose is elevated today.   2. OSA (obstructive sleep apnea) Patient is on CPAP very reliably. He continues to work on his weight loss to improve his obstructive sleep apnea.   Assessment/Plan:   1. Type 2 diabetes mellitus with complication, with long-term current use of insulin ([name]) Patient will continue Ozempic 2 mg and he will continue his diet. We will continue to monitor.   2. OSA (obstructive sleep apnea) Patient is to concentrate on his weight loss as obstructive sleep apnea treatment, and we will continue to follow closely.   3. BMI 40.0-44.9, adult ([name])  4. Obesity, Beginning BMI 40.87 Troy [name] is currently in the action stage of change. As such, his goal is to continue with weight loss efforts. He has agreed to the Category 3 Plan.   Exercise goals: As is.   Behavioral modification strategies: increasing lean protein intake.  Troy [name] has agreed to follow-up with our clinic in 4 weeks. He was informed of the importance of frequent follow-up visits to maximize  his success with intensive lifestyle modifications for his multiple health conditions.   Objective:   Blood pressure 100/66, pulse 97, temperature 97.6 F (36.4 C), height 5\' 10"  (1.778 m), weight 290 lb (131.5 kg), SpO2 96%. Body mass index is 41.61 kg/m.  Lab Results  Component Value Date   CREATININE 0.78 10/03/2022   BUN 13 10/03/2022   NA 142 10/03/2022   K 4.8 10/03/2022   CL 103 10/03/2022   CO2 24 10/03/2022   Lab Results  Component Value Date   ALT 20 10/03/2022   AST 15 10/03/2022   ALKPHOS 69 10/03/2022   BILITOT 0.4 10/03/2022   Lab Results  Component Value Date   HGBA1C 7.5 (H) 10/03/2022   HGBA1C 7.0 (A) 04/17/2022   HGBA1C 7.4 (A) 12/12/2021   HGBA1C 6.9 (A) 08/08/2021   HGBA1C 7.9 (H) 03/23/2021   Lab Results  Component Value Date   INSULIN 7.9 09/06/2020   Lab Results  Component Value Date   TSH 0.82 01/31/2022   Lab Results  Component Value Date   CHOL 208 (H) 10/03/2022   HDL 34 (L) 10/03/2022   LDLCALC 134 (H) 10/03/2022   LDLDIRECT 125.0 12/26/2018   TRIG 223 (H) 10/03/2022   CHOLHDL 4 01/31/2022   Lab Results  Component Value Date   VD25OH 32.8 10/03/2022   VD25OH 70.2 03/23/2021   VD25OH 55.3 09/06/2020   Lab Results  Component Value Date   WBC 8.6 01/31/2022   HGB 16.7 01/31/2022   HCT 49.1 01/31/2022  MCV 97.5 01/31/2022   PLT 239.0 01/31/2022   Lab Results  Component Value Date   IRON 144 02/08/2020   TIBC 358 02/08/2020   FERRITIN 96 02/08/2020   Attestation Statements:   Reviewed by clinician on day of visit: allergies, medications, problem list, medical history, surgical history, family history, social history, and previous encounter notes.  Time spent on visit including pre-visit chart review and post-visit care and charting was 20 minutes.   I, Burt Knack, am acting as transcriptionist for Quillian Quince, MD.  I have reviewed the above documentation for accuracy and completeness, and I agree with the  above. -  Quillian Quince, MD

## 2022-10-30 IMAGING — CR DG CERVICAL SPINE 2 OR 3 VIEWS
3 series · 3 of 3 positions shown · non-contrast
Comparison: December 14, 2019

CLINICAL DATA: Anterior cervical decompression/discectomy fusion.

EXAM:
CERVICAL SPINE - 2-3 VIEW

[AP (1 of 3)]
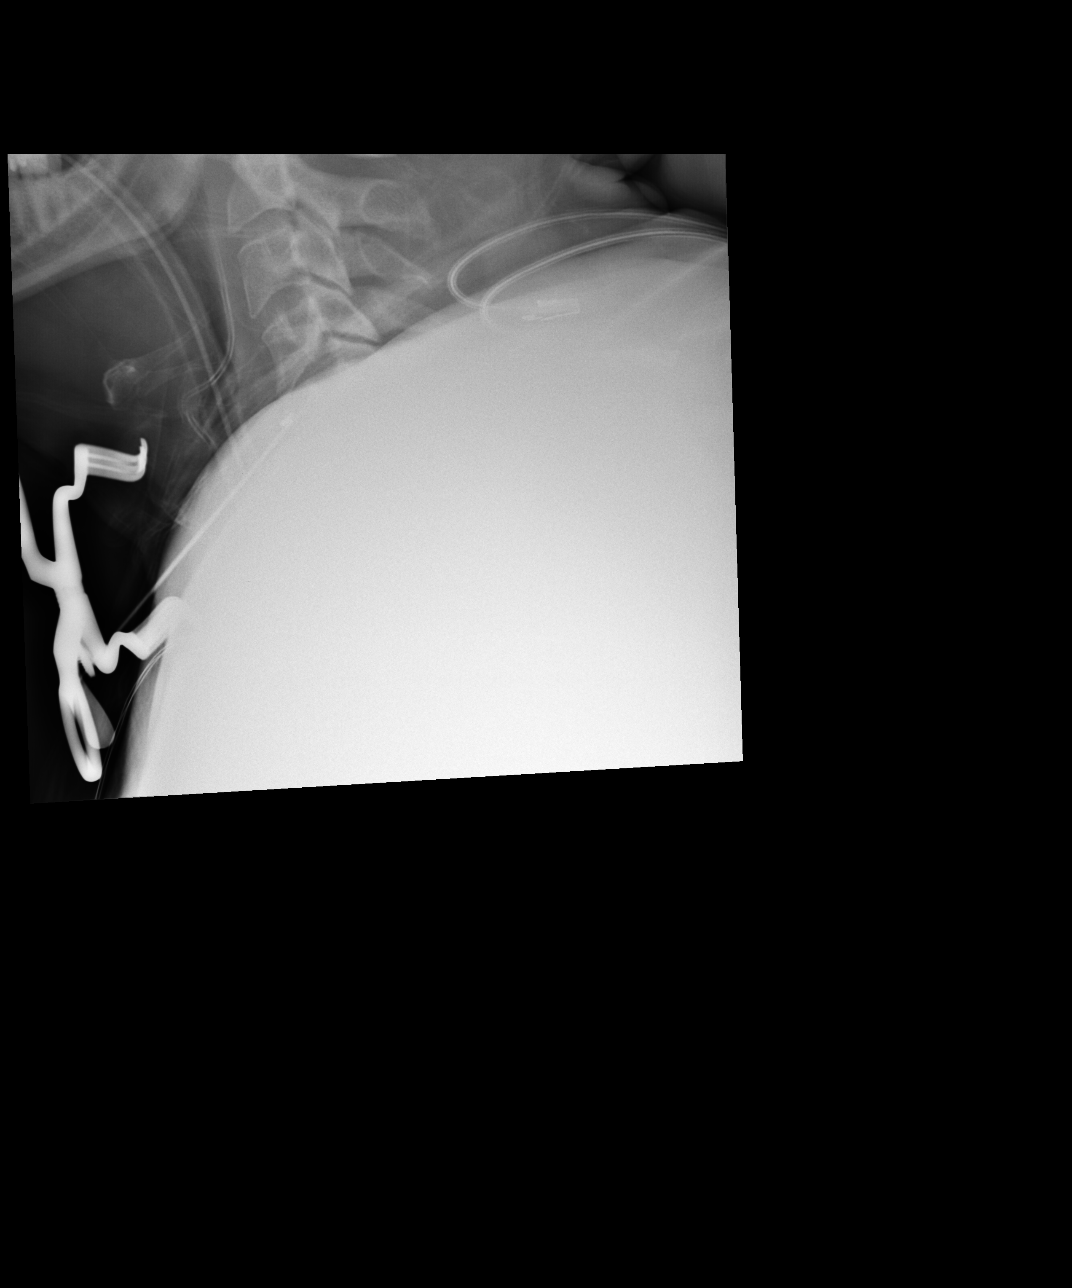

[AP (2 of 3)]
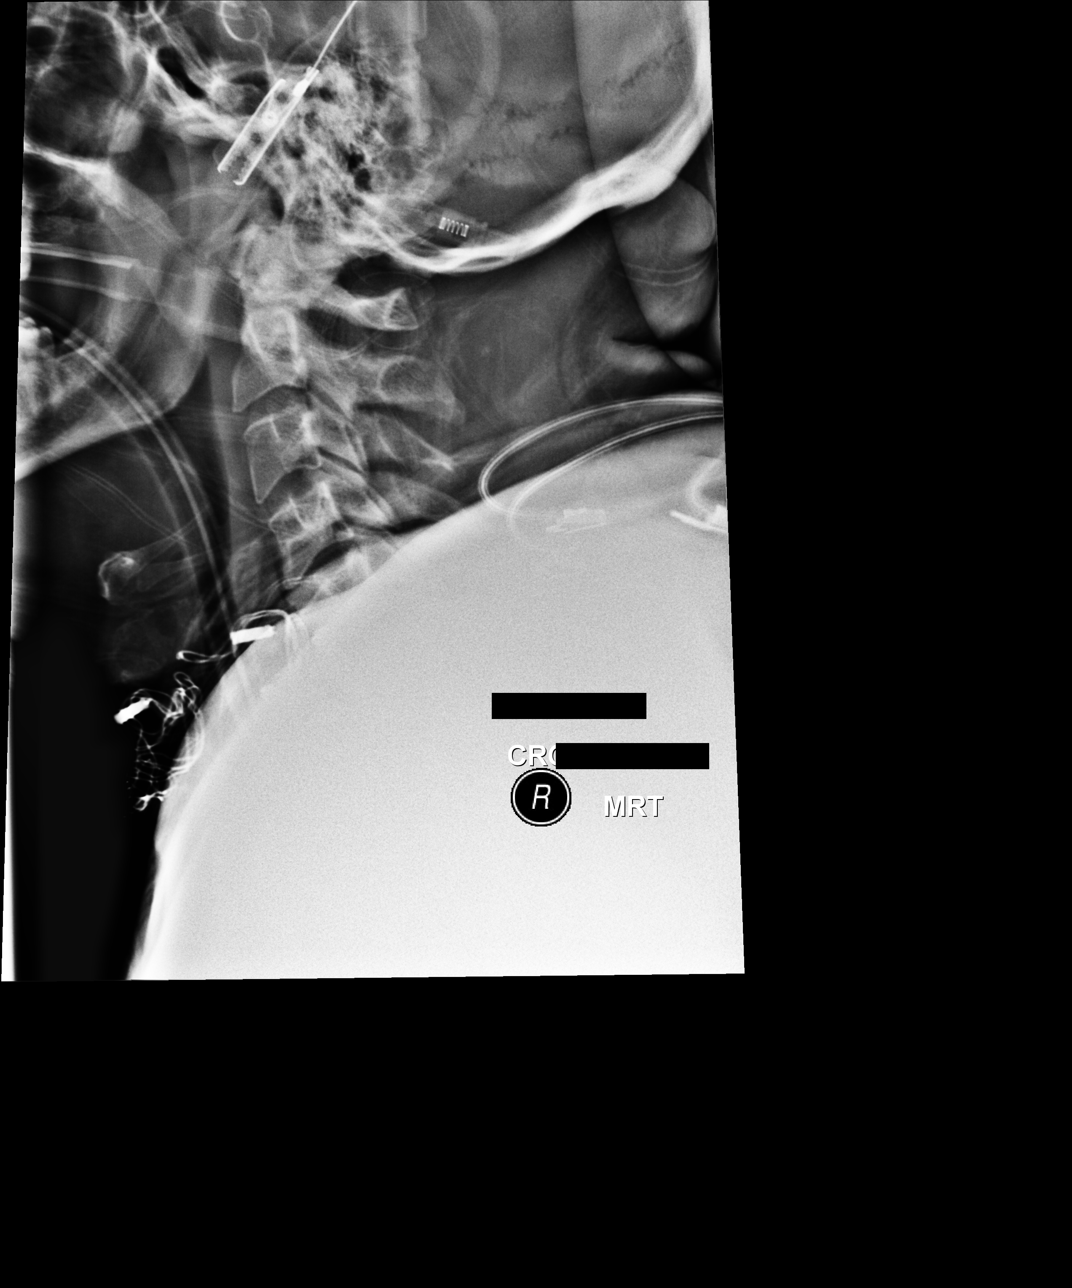

[AP (3 of 3)]
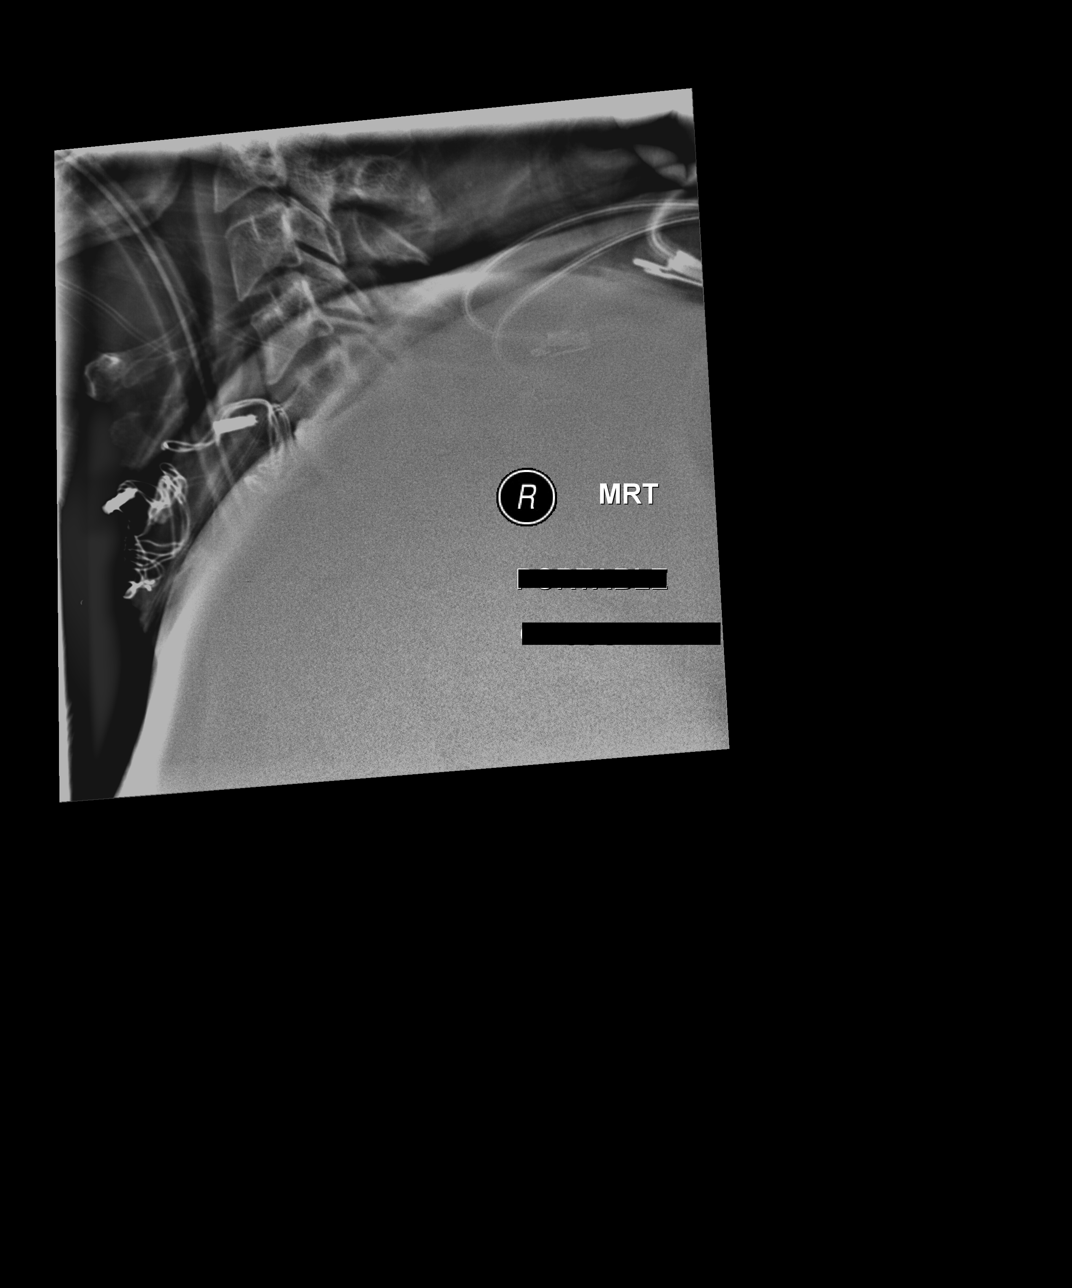

[3 of 3 positions shown; findings below may reference images not displayed]

FINDINGS: Limited study secondary to difficult patient positioning. As a
result, markedly limited visualization of the C6 and C7 vertebral
bodies is noted. There is no evidence of cervical spine fracture or
prevertebral soft tissue swelling. Alignment is normal. A radiopaque
fusion plate and screws are seen at the approximate level of the C5
vertebral body.
IMPRESSION: 1. Limited study secondary to difficult patient positioning.
2. Partially visualized radiopaque fusion plate and screws at the
approximate level of the C5 vertebral body.

## 2022-11-01 ENCOUNTER — Encounter (HOSPITAL_BASED_OUTPATIENT_CLINIC_OR_DEPARTMENT_OTHER): Payer: Self-pay | Admitting: Pulmonary Disease

## 2022-11-01 ENCOUNTER — Ambulatory Visit (INDEPENDENT_AMBULATORY_CARE_PROVIDER_SITE_OTHER): Payer: BC Managed Care – PPO | Admitting: Pulmonary Disease

## 2022-11-01 VITALS — BP 132/74 | HR 94 | Ht 70.0 in | Wt 299.0 lb

## 2022-11-01 DIAGNOSIS — R071 Chest pain on breathing: Secondary | ICD-10-CM

## 2022-11-01 DIAGNOSIS — G4733 Obstructive sleep apnea (adult) (pediatric): Secondary | ICD-10-CM | POA: Diagnosis not present

## 2022-11-01 NOTE — Patient Instructions (Addendum)
CPAP is working well No pulmonary cause  of your symptoms

## 2022-11-01 NOTE — Progress Notes (Signed)
   Subjective:    Patient ID: Troy [name], male    DOB: 24-Jun-1970, 52 y.o.   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 Since his last visit, he has been diagnosed with dysautonomia.  He is seeing a Barrister's clerk at [employer]  He had an ED visit for substernal chest pain radiating to both sides.  This was labeled as pleurisy.  Chest x-ray 02/2022 was clear. Echo 03/2022 shows normal LV function no evidence of pulm hypertension. His pain is still persistent after 9 months and he was told to investigate for pulmonary causes. He has been maintained on anticoagulation since 2019. He has gained 20 pounds over the last 2 years. CPAP is working well and he denies any problems with mask or pressure. He is very compliant   Significant tests/ events reviewed   NPSG 03/2004 -moderate OSA,  AHI of 28 , lowest 75%, wt 350 lbs   Review of Systems neg for any significant sore throat, dysphagia, itching, sneezing, nasal congestion or excess/ purulent secretions, fever, chills, sweats, unintended wt loss, pleuritic or exertional cp, hempoptysis, orthopnea pnd or change in chronic leg swelling. Also denies presyncope, palpitations, heartburn, abdominal pain, nausea, vomiting, diarrhea or change in bowel or urinary habits, dysuria,hematuria, rash, arthralgias, visual complaints, headache, numbness weakness or ataxia.      Objective:   Physical Exam  Gen. Pleasant, obese, in no distress ENT - no lesions, no post nasal drip Neck: No JVD, no thyromegaly, no carotid bruits Lungs: no use of accessory muscles, no dullness to percussion, decreased without rales or rhonchi  Cardiovascular: Rhythm regular, heart sounds  normal, no murmurs or gallops, 1+ peripheral edema Musculoskeletal: No deformities, no cyanosis or  clubbing , no tremors       Assessment & Plan:   He has unexplained chest pain radiating to the right and left shoulders.  He is being evaluated by a cardiac specialist in [facility] for dysautonomia and pain is considered noncardiac and has been attributed to "pleurisy".  I do not think we have to be concerned about pulm embolism since he has been maintained on anticoagulation and reports good compliance with Eliquis.  Pain has been ongoing for 9 months now so less likely to be musculoskeletal.  We will investigate with CT chest without contrast

## 2022-11-01 NOTE — Assessment & Plan Note (Signed)
CPAP download was reviewed which shows excellent control of events on auto settings 10 to 14 cm with average pressure close to 14 cm.  He is very compliant.  CPAP is only helped improve his daytime somnolence and fatigue. CPAP supplies will be renewed for a year Weight loss encouraged, compliance with goal of at least 4-6 hrs every night is the expectation. Advised against medications with sedative side effects Cautioned against driving when sleepy - understanding that sleepiness will vary on a day to day basis

## 2022-11-09 ENCOUNTER — Other Ambulatory Visit: Payer: Self-pay | Admitting: Internal Medicine

## 2022-11-13 ENCOUNTER — Encounter: Payer: BC Managed Care – PPO | Admitting: Rehabilitative and Restorative Service Providers"

## 2022-11-16 NOTE — Addendum Note (Signed)
Addended by: Pollie Meyer on: 11/16/2022 11:33 AM   Modules accepted: Orders

## 2022-11-20 ENCOUNTER — Ambulatory Visit
Admission: RE | Admit: 2022-11-20 | Discharge: 2022-11-20 | Disposition: A | Discharge status: Home / Self Care | Payer: BC Managed Care – PPO | Source: Ambulatory Visit | Attending: Pulmonary Disease | Admitting: Pulmonary Disease

## 2022-11-20 DIAGNOSIS — R0789 Other chest pain: Secondary | ICD-10-CM | POA: Diagnosis not present

## 2022-11-20 DIAGNOSIS — I2510 Atherosclerotic heart disease of native coronary artery without angina pectoris: Secondary | ICD-10-CM | POA: Diagnosis not present

## 2022-11-20 DIAGNOSIS — R071 Chest pain on breathing: Secondary | ICD-10-CM

## 2022-11-21 ENCOUNTER — Ambulatory Visit (INDEPENDENT_AMBULATORY_CARE_PROVIDER_SITE_OTHER): Payer: BC Managed Care – PPO | Admitting: Family Medicine

## 2022-11-21 ENCOUNTER — Encounter (INDEPENDENT_AMBULATORY_CARE_PROVIDER_SITE_OTHER): Payer: Self-pay | Admitting: Family Medicine

## 2022-11-21 VITALS — BP 99/65 | HR 89 | Temp 98.0°F | Ht 70.0 in | Wt 290.0 lb

## 2022-11-21 DIAGNOSIS — F3289 Other specified depressive episodes: Secondary | ICD-10-CM

## 2022-11-21 DIAGNOSIS — E118 Type 2 diabetes mellitus with unspecified complications: Secondary | ICD-10-CM

## 2022-11-21 DIAGNOSIS — Z6841 Body Mass Index (BMI) 40.0 and over, adult: Secondary | ICD-10-CM

## 2022-11-21 DIAGNOSIS — Z7985 Long-term (current) use of injectable non-insulin antidiabetic drugs: Secondary | ICD-10-CM

## 2022-11-21 DIAGNOSIS — Z7984 Long term (current) use of oral hypoglycemic drugs: Secondary | ICD-10-CM

## 2022-11-21 DIAGNOSIS — E669 Obesity, unspecified: Secondary | ICD-10-CM | POA: Diagnosis not present

## 2022-11-21 DIAGNOSIS — F5089 Other specified eating disorder: Secondary | ICD-10-CM

## 2022-11-21 DIAGNOSIS — Z794 Long term (current) use of insulin: Secondary | ICD-10-CM

## 2022-11-21 MED ORDER — BUPROPION HCL ER (SR) 150 MG PO TB12: 150.0000 mg | ORAL_TABLET | Freq: Every day | ORAL | 0 refills | Status: DC

## 2022-11-21 MED ORDER — ESCITALOPRAM OXALATE 20 MG PO TABS
20.0000 mg | ORAL_TABLET | Freq: Every day | ORAL | 0 refills | Status: DC
Start: 2022-11-21 — End: 2022-12-19

## 2022-11-22 ENCOUNTER — Encounter: Payer: Self-pay | Admitting: Internal Medicine

## 2022-11-22 DIAGNOSIS — E113313 Type 2 diabetes mellitus with moderate nonproliferative diabetic retinopathy with macular edema, bilateral: Secondary | ICD-10-CM | POA: Diagnosis not present

## 2022-11-22 LAB — HM DIABETES EYE EXAM

## 2022-11-26 ENCOUNTER — Telehealth: Payer: Self-pay

## 2022-11-26 NOTE — Telephone Encounter (Signed)
Pt request FMLA form to be fill out.   A virtual video appt is scheduled on 12/04/2022.   Form placed in provider's red folder.

## 2022-11-27 DIAGNOSIS — G4733 Obstructive sleep apnea (adult) (pediatric): Secondary | ICD-10-CM | POA: Diagnosis not present

## 2022-11-28 NOTE — Progress Notes (Unsigned)
Chief Complaint:   OBESITY Troy [name] is here to discuss his progress with his obesity treatment plan along with follow-up of his obesity related diagnoses. Troy [name] is on the Category 3 Plan and states he is following his eating plan approximately 90% of the time. Troy [name] states he is doing 0 minutes 0 times per week.  Today's visit was #: 40 Starting weight: 293 lbs Starting date: 09/07/2019 Today's weight: 290 lbs Today's date: 11/21/2022 Total lbs lost to date: 3 Total lbs lost since last in-office visit: 0  Interim History: Patient has done well with maintaining his weight loss.  He is working on following his category 3 plan with some substitutions.  He has not been exercising as his orthostatics hypotension has caused multiple falls recently.  Subjective:   1. Type 2 diabetes mellitus with complication, with long-term current use of insulin ([name]) Patient's recent A1c was 5.7 which is above goal, but improving with his diet, exercise, and weight loss as well as Ozempic, Jardiance, metformin, and NovoLog.  He had been on Mounjaro in the past with more GI upset.  He notes his blood sugars range between 100-260, but mostly 140-160.  2. Emotional Eating Behavior Patient notes increased emotional eating behavior especially when he is off work and has more time at home.  He has no problems with Lexapro, and he denies a history of seizure disorder.  Assessment/Plan:   1. Type 2 diabetes mellitus with complication, with long-term current use of insulin ([name]) Patient will continue to work on his diet with no change in medications.  We will continue to monitor for hypoglycemia with controlled weight loss.  2. Emotional Eating Behavior Patient agreed to start Wellbutrin SR 150 mg every morning with no refills; and we will refill Lexapro 20 mg once daily for 1 month.  - escitalopram (LEXAPRO) 20 MG tablet; Take 1 tablet (20 mg total) by mouth daily.  Dispense: 30 tablet; Refill: 0 -  buPROPion (WELLBUTRIN SR) 150 MG 12 hr tablet; Take 1 tablet (150 mg total) by mouth daily.  Dispense: 30 tablet; Refill: 0  3. BMI 40.0-44.9, adult ([name])  4. Obesity, Beginning BMI 40.87 Troy [name] is currently in the action stage of change. As such, his goal is to continue with weight loss efforts. He has agreed to the Category 3 Plan.   Exercise goals: No exercise has been prescribed at this time, until falls have stopped.  Behavioral modification strategies: no skipping meals.  Troy [name] has agreed to follow-up with our clinic in 4 weeks. He was informed of the importance of frequent follow-up visits to maximize his success with intensive lifestyle modifications for his multiple health conditions.   Objective:   Blood pressure 99/65, pulse 89, temperature 98 F (36.7 C), height 5\' 10"  (1.778 m), weight 290 lb (131.5 kg), SpO2 97%. Body mass index is 41.61 kg/m.  Lab Results  Component Value Date   CREATININE 0.78 10/03/2022   BUN 13 10/03/2022   NA 142 10/03/2022   K 4.8 10/03/2022   CL 103 10/03/2022   CO2 24 10/03/2022   Lab Results  Component Value Date   ALT 20 10/03/2022   AST 15 10/03/2022   ALKPHOS 69 10/03/2022   BILITOT 0.4 10/03/2022   Lab Results  Component Value Date   HGBA1C 7.5 (H) 10/03/2022   HGBA1C 7.7 (A) 09/05/2022   HGBA1C 7.0 (A) 04/17/2022   HGBA1C 7.4 (A) 12/12/2021   HGBA1C 6.9 (A) 08/08/2021   Lab Results  Component  Value Date   INSULIN 7.9 09/06/2020   Lab Results  Component Value Date   TSH 0.82 01/31/2022   Lab Results  Component Value Date   CHOL 208 (H) 10/03/2022   HDL 34 (L) 10/03/2022   LDLCALC 134 (H) 10/03/2022   LDLDIRECT 125.0 12/26/2018   TRIG 223 (H) 10/03/2022   CHOLHDL 4 01/31/2022   Lab Results  Component Value Date   VD25OH 32.8 10/03/2022   VD25OH 70.2 03/23/2021   VD25OH 55.3 09/06/2020   Lab Results  Component Value Date   WBC 8.6 01/31/2022   HGB 16.7 01/31/2022   HCT 49.1 01/31/2022   MCV 97.5  01/31/2022   PLT 239.0 01/31/2022   Lab Results  Component Value Date   IRON 144 02/08/2020   TIBC 358 02/08/2020   FERRITIN 96 02/08/2020   Attestation Statements:   Reviewed by clinician on day of visit: allergies, medications, problem list, medical history, surgical history, family history, social history, and previous encounter notes.   I, Burt Knack, am acting as transcriptionist for Quillian Quince, MD.  I have reviewed the above documentation for accuracy and completeness, and I agree with the above. -  Quillian Quince, MD

## 2022-12-03 NOTE — Progress Notes (Unsigned)
Virtual Visit via Video Note  I connected with Troy [name] on 12/05/22 at  9:00 AM EDT by a video enabled telemedicine application and verified that I am speaking with the correct person using two identifiers. Location patient: work  [employer]  office Persons participating in the virtual visit: patient, provider  Patient aware  of the limitations of evaluation and management by telemedicine and  availability of in person appointments. and agreed to proceed.   HPI: Troy [name] presents for video visit   for new  work form   Is followed and managed , pulm osa,  weight  management Dr Malena Edman   endocrine  and cards eval for  cp and orthostasis . Poss pots  Last visit with me 6 24  He has had accomodation at work for work station  with past hx of c spine surgery   RUE changes from injury , and recurrent dvt and venous insufficiency  Has dysautonomia  and takes high salt   to maintain bp and decrease sx. Recently dx if diabetic retinopathy and edema left eye. To begin injections 3 d per week for 12 - 18 mos  . Needs fmla  time updated for rx and needed time in between .Has some dots vision missing   right more than  left .   ROS: See pertinent positives and negatives per HPI. And previous notes   Past Medical History:  Diagnosis Date   Arm weakness    right    Asthma    B12 deficiency    Back pain    Complication of anesthesia    "as a teenager , as they were waken up, I was shaking all over- they called it convulsions -it has never happen again"   Diabetes insipidus ([name])    Diabetes mellitus without complication ([name])    TYPE II   DVT (deep venous thrombosis) ([name])    right leg last time 2020   Leg edema, right    Male infertility    Neck injury 12/22/2013   Neuropathy    Obesity    Obesity    OSA (obstructive sleep apnea)    CPAP    Past Surgical History:  Procedure Laterality Date   ANTERIOR CERVICAL DECOMP/DISCECTOMY FUSION N/A 05/25/2020    Procedure: ANTERIOR CERVICAL DECOMPRESSION/DISCECTOMY FUSION, INTERBODY PROSTHESIS, PLATE/SCREWS CERVICAL FIVE- CERVICAL SIX;  Surgeon: Tressie Stalker, MD;  Location: [facility] OR;  Service: Neurosurgery;  Laterality: N/A;   BACK SURGERY     CERVICAL DISC SURGERY     C6/C7 2009   cubital tunnel left arm     2003   ELBOW SURGERY     FIBULA FRACTURE SURGERY     plate & pin removed due to infection 1997   ULNAR NERVE REPAIR      Family History  Problem Relation Age of Onset   Heart disease Mother    Other Mother        clotting disorders   Diabetes Mother    High blood pressure Mother    Kidney disease Mother    Depression Mother    Sleep apnea Mother    Obesity Mother    Other Father        sudden death/cervical and lumbar disk disease   Aneurysm Father        In his 71s smoked   Hyperlipidemia Father    High blood pressure Father    Sudden death Father    Alcoholism Father  Hypertension Other    Allergies Other    Deep vein thrombosis Other    Sleep apnea Other    Obesity Other     Social History   Tobacco Use   Smoking status: Never   Smokeless tobacco: Never  Vaping Use   Vaping status: Never Used  Substance Use Topics   Alcohol use: Yes    Comment: 1 - 2 a year   Drug use: No      Current Outpatient Medications:    albuterol (PROVENTIL HFA;VENTOLIN HFA) 108 (90 Base) MCG/ACT inhaler, Inhale 2 puffs into the lungs every 6 (six) hours as needed. For wheezing, Disp: 1 Inhaler, Rfl: 2   atorvastatin (LIPITOR) 40 MG tablet, TAKE 1 TABLET DAILY, Disp: 90 tablet, Rfl: 2   buPROPion (WELLBUTRIN SR) 150 MG 12 hr tablet, Take 1 tablet (150 mg total) by mouth daily., Disp: 30 tablet, Rfl: 0   cholecalciferol (VITAMIN D3) 25 MCG (1000 UNIT) tablet, Take 1,000 Units by mouth daily., Disp: , Rfl:    Continuous Blood Gluc Sensor (DEXCOM G7 SENSOR) MISC, 3 each by Does not apply route every 30 (thirty) days. Apply 1 sensor every 10 days, Disp: 9 each, Rfl: 4   ELIQUIS 5 MG  TABS tablet, TAKE 1 TABLET TWICE A DAY, Disp: 180 tablet, Rfl: 2   escitalopram (LEXAPRO) 20 MG tablet, Take 1 tablet (20 mg total) by mouth daily., Disp: 30 tablet, Rfl: 0   insulin aspart (NOVOLOG) 100 UNIT/ML injection, Use 80 units a day in the insulin pump, Disp: 90 mL, Rfl: 3   Insulin Disposable Pump (V-GO 30) 30 UNIT/24HR KIT, USE AND DISCARD 1 V-GO     DEVICE DAILY, Disp: 90 kit, Rfl: 3   JARDIANCE 25 MG TABS tablet, TAKE 1 TABLET BY MOUTH EVERY DAY BEFORE BREAKFAST, Disp: 90 tablet, Rfl: 0   metFORMIN (GLUCOPHAGE-XR) 500 MG 24 hr tablet, TAKE 4 TABLETS DAILY WITH BREAKFAST, Disp: 360 tablet, Rfl: 3   midodrine (PROAMATINE) 5 MG tablet, Take 5 mg by mouth 2 (two) times daily., Disp: , Rfl:    Semaglutide, 2 MG/DOSE, (OZEMPIC, 2 MG/DOSE,) 8 MG/3ML SOPN, Inject 2 mg into the skin once a week., Disp: 9 mL, Rfl: 3   SODIUM CHLORIDE PO, Take 4 g by mouth., Disp: , Rfl:    nitroGLYCERIN (NITROSTAT) 0.4 MG SL tablet, Place 1 tablet (0.4 mg total) under the tongue every 5 (five) minutes as needed for chest pain., Disp: 90 tablet, Rfl: 3  EXAM: BP Readings from Last 3 Encounters:  11/21/22 99/65  11/01/22 132/74  10/24/22 100/66    VITALS per patient if applicable:  GENERAL: alert, oriented, appears well and in no acute distress  HEENT: atraumatic, conjunttiva clear, no obvious abnormalities on inspection of external nose and ears  LUNGS: on inspection no signs of respiratory distress, breathing rate appears normal, no obvious gross SOB, gasping or wheezing  PSYCH/NEURO: pleasant and cooperative, no obvious depression or anxiety, speech and thought processing grossly intact Lab Results  Component Value Date   WBC 8.6 01/31/2022   HGB 16.7 01/31/2022   HCT 49.1 01/31/2022   PLT 239.0 01/31/2022   GLUCOSE 188 (H) 10/03/2022   CHOL 208 (H) 10/03/2022   TRIG 223 (H) 10/03/2022   HDL 34 (L) 10/03/2022   LDLDIRECT 125.0 12/26/2018   LDLCALC 134 (H) 10/03/2022   ALT 20 10/03/2022    AST 15 10/03/2022   NA 142 10/03/2022   K 4.8 10/03/2022   CL 103  10/03/2022   CREATININE 0.78 10/03/2022   BUN 13 10/03/2022   CO2 24 10/03/2022   TSH 0.82 01/31/2022   HGBA1C 7.5 (H) 10/03/2022   MICROALBUR <0.7 01/31/2022    ASSESSMENT AND PLAN:  Discussed the following assessment and plan:    ICD-10-CM   1. Diabetic retinopathy of both eyes with macular edema associated with type 2 diabetes mellitus, unspecified retinopathy severity ([name])  E11.311    edema on right to be gin injections bilateral for 12- 18 mos some dec vision sectors    2. Dysautonomia ([name])  G90.1    with recureent dizziness vertigo and hx of fall or near fall under  care    3. Type 2 diabetes mellitus with complication, with long-term current use of insulin ([name])  E11.8    Z79.4     4. Chronic deep vein thrombosis (DVT) of right lower extremity, unspecified vein ([name])  I82.501     5. Chronic anticoagulation  Z79.01      Updated fmla   form and fu  to be faxed and mail copy to patient Advise optimize bath shower are for fall prevention  Form  RX 3 appt per week 4 hours per appt. Flares if needed 3 x per week  up to 8 hours per episode as will need to go home and rest doe light headedness other  Counseled. Fall prevention  as he is also  on anticoagulation  Expectant management and discussion of plan and treatment with opportunity to ask questions and all were answered. The patient agreed with the plan and demonstrated an understanding of the instructions.   Advised to call back or seek an in-person evaluation if worsening  or having  further concerns  in interim. Return for when planned.   Berniece Andreas, MD

## 2022-12-04 ENCOUNTER — Encounter: Payer: Self-pay | Admitting: Internal Medicine

## 2022-12-04 ENCOUNTER — Telehealth (INDEPENDENT_AMBULATORY_CARE_PROVIDER_SITE_OTHER): Payer: BC Managed Care – PPO | Admitting: Internal Medicine

## 2022-12-04 VITALS — Ht 70.0 in | Wt 287.0 lb

## 2022-12-04 DIAGNOSIS — E11311 Type 2 diabetes mellitus with unspecified diabetic retinopathy with macular edema: Secondary | ICD-10-CM

## 2022-12-04 DIAGNOSIS — G901 Familial dysautonomia [Riley-Day]: Secondary | ICD-10-CM | POA: Diagnosis not present

## 2022-12-04 DIAGNOSIS — I82501 Chronic embolism and thrombosis of unspecified deep veins of right lower extremity: Secondary | ICD-10-CM | POA: Diagnosis not present

## 2022-12-04 DIAGNOSIS — E118 Type 2 diabetes mellitus with unspecified complications: Secondary | ICD-10-CM

## 2022-12-04 DIAGNOSIS — Z794 Long term (current) use of insulin: Secondary | ICD-10-CM

## 2022-12-04 DIAGNOSIS — Z7901 Long term (current) use of anticoagulants: Secondary | ICD-10-CM

## 2022-12-06 DIAGNOSIS — E113313 Type 2 diabetes mellitus with moderate nonproliferative diabetic retinopathy with macular edema, bilateral: Secondary | ICD-10-CM | POA: Diagnosis not present

## 2022-12-06 NOTE — Telephone Encounter (Signed)
Form to you signed  please fax and also mail copy to patient

## 2022-12-06 NOTE — Telephone Encounter (Signed)
Form was faxed and received a confirmation.   A copy was dropped to be mail out.   Attempted to reach pt. Left a detail message in pt's voicemail.

## 2022-12-12 ENCOUNTER — Encounter (INDEPENDENT_AMBULATORY_CARE_PROVIDER_SITE_OTHER): Payer: Self-pay | Admitting: *Deleted

## 2022-12-14 ENCOUNTER — Other Ambulatory Visit (INDEPENDENT_AMBULATORY_CARE_PROVIDER_SITE_OTHER): Payer: Self-pay | Admitting: Family Medicine

## 2022-12-14 DIAGNOSIS — F3289 Other specified depressive episodes: Secondary | ICD-10-CM

## 2022-12-18 ENCOUNTER — Other Ambulatory Visit: Payer: Self-pay | Admitting: Family

## 2022-12-18 DIAGNOSIS — G909 Disorder of the autonomic nervous system, unspecified: Secondary | ICD-10-CM | POA: Diagnosis not present

## 2022-12-18 DIAGNOSIS — I499 Cardiac arrhythmia, unspecified: Secondary | ICD-10-CM | POA: Diagnosis not present

## 2022-12-18 DIAGNOSIS — I2510 Atherosclerotic heart disease of native coronary artery without angina pectoris: Secondary | ICD-10-CM | POA: Diagnosis not present

## 2022-12-18 DIAGNOSIS — E782 Mixed hyperlipidemia: Secondary | ICD-10-CM | POA: Diagnosis not present

## 2022-12-19 ENCOUNTER — Ambulatory Visit (INDEPENDENT_AMBULATORY_CARE_PROVIDER_SITE_OTHER): Payer: BC Managed Care – PPO | Admitting: Family Medicine

## 2022-12-19 ENCOUNTER — Encounter (INDEPENDENT_AMBULATORY_CARE_PROVIDER_SITE_OTHER): Payer: Self-pay | Admitting: Family Medicine

## 2022-12-19 VITALS — BP 102/67 | HR 83 | Temp 97.8°F | Ht 70.0 in | Wt 286.0 lb

## 2022-12-19 DIAGNOSIS — F3289 Other specified depressive episodes: Secondary | ICD-10-CM

## 2022-12-19 DIAGNOSIS — Z7985 Long-term (current) use of injectable non-insulin antidiabetic drugs: Secondary | ICD-10-CM

## 2022-12-19 DIAGNOSIS — Z6841 Body Mass Index (BMI) 40.0 and over, adult: Secondary | ICD-10-CM

## 2022-12-19 DIAGNOSIS — F5089 Other specified eating disorder: Secondary | ICD-10-CM

## 2022-12-19 DIAGNOSIS — E669 Obesity, unspecified: Secondary | ICD-10-CM | POA: Diagnosis not present

## 2022-12-19 DIAGNOSIS — E782 Mixed hyperlipidemia: Secondary | ICD-10-CM | POA: Diagnosis not present

## 2022-12-19 DIAGNOSIS — E118 Type 2 diabetes mellitus with unspecified complications: Secondary | ICD-10-CM | POA: Diagnosis not present

## 2022-12-19 DIAGNOSIS — Z794 Long term (current) use of insulin: Secondary | ICD-10-CM

## 2022-12-19 MED ORDER — ESCITALOPRAM OXALATE 20 MG PO TABS
20.0000 mg | ORAL_TABLET | Freq: Every day | ORAL | 0 refills | Status: DC
Start: 2022-12-19 — End: 2023-07-11

## 2022-12-19 MED ORDER — ATORVASTATIN CALCIUM 80 MG PO TABS
80.0000 mg | ORAL_TABLET | Freq: Every day | ORAL | 0 refills | Status: DC
Start: 2022-12-19 — End: 2023-07-11

## 2022-12-19 MED ORDER — BUPROPION HCL ER (SR) 150 MG PO TB12
150.0000 mg | ORAL_TABLET | Freq: Every day | ORAL | 0 refills | Status: DC
Start: 2022-12-19 — End: 2023-05-14

## 2022-12-19 NOTE — Progress Notes (Unsigned)
Chief Complaint:   OBESITY Troy [name] is here to discuss his progress with his obesity treatment plan along with follow-up of his obesity related diagnoses. Troy [name] is on the Category 3 Plan and states he is following his eating plan approximately 90% of the time. Troy [name] states he is doing arm exercises for 20 minutes 2-3 times per week.  Today's visit was #: 41 Starting weight: 293 lbs Starting date: 09/07/2019 Today's weight: 286 lbs Today's date: 12/19/2022 Total lbs lost to date: 7 Total lbs lost since last in-office visit: 4  Interim History: Patient continues to do well with his weight loss.  He has been working on increasing his protein and trying to decrease simple carbohydrates.  Subjective:   1. Mixed hyperlipidemia Patient is on Lipitor 40 mg but his LDL is not yet at goal.  He saw Cardiology recently who suggested an increase in his statin.  2. Type 2 diabetes mellitus with complication, with long-term current use of insulin ([name]) Patient is on Ozempic 2 mg, and he is tolerating Ozempic better than Mounjaro with decreased GI upset.  His last A1c was 7.5.  3. Emotional Eating Behavior Patient is working on decreasing emotional eating behavior, and he is doing well on his medications with no side effects noted.  Assessment/Plan:   1. Mixed hyperlipidemia Patient agreed to increase Lipitor to 80 mg once daily, and we will refill with a 90-day supply.  - atorvastatin (LIPITOR) 80 MG tablet; Take 1 tablet (80 mg total) by mouth daily.  Dispense: 90 tablet; Refill: 0  2. Type 2 diabetes mellitus with complication, with long-term current use of insulin ([name]) Patient will continue Ozempic at 2 mg, and he will continue with his diet.  We will continue to monitor his progress.  3. Emotional Eating Behavior Patient will continue his medications, and we will refill Lexapro 20 mg daily and Wellbutrin SR 150 mg daily with a 90-day supply.  - escitalopram (LEXAPRO) 20 MG  tablet; Take 1 tablet (20 mg total) by mouth daily.  Dispense: 90 tablet; Refill: 0 - buPROPion (WELLBUTRIN SR) 150 MG 12 hr tablet; Take 1 tablet (150 mg total) by mouth daily.  Dispense: 90 tablet; Refill: 0  4. BMI 40.0-44.9, adult ([name])  5. Obesity, Beginning BMI 40.87 Troy [name] is currently in the action stage of change. As such, his goal is to continue with weight loss efforts. He has agreed to the Category 3 Plan.   Exercise goals: For substantial health benefits, adults should do at least 150 minutes (2 hours and 30 minutes) a week of moderate-intensity, or 75 minutes (1 hour and 15 minutes) a week of vigorous-intensity aerobic physical activity, or an equivalent combination of moderate- and vigorous-intensity aerobic activity. Aerobic activity should be performed in episodes of at least 10 minutes, and preferably, it should be spread throughout the week.  Behavioral modification strategies: increasing lean protein intake.  [name] has agreed to follow-up with our clinic in 4 weeks. He was informed of the importance of frequent follow-up visits to maximize his success with intensive lifestyle modifications for his multiple health conditions.   Objective:   Blood pressure 102/67, pulse 83, temperature 97.8 F (36.6 C), height 5\' 10"  (1.778 m), weight 286 lb (129.7 kg), SpO2 95%. Body mass index is 41.04 kg/m.  Lab Results  Component Value Date   CREATININE 0.78 10/03/2022   BUN 13 10/03/2022   NA 142 10/03/2022   K 4.8 10/03/2022   CL 103 10/03/2022  CO2 24 10/03/2022   Lab Results  Component Value Date   ALT 20 10/03/2022   AST 15 10/03/2022   ALKPHOS 69 10/03/2022   BILITOT 0.4 10/03/2022   Lab Results  Component Value Date   HGBA1C 7.5 (H) 10/03/2022   HGBA1C 7.7 (A) 09/05/2022   HGBA1C 7.0 (A) 04/17/2022   HGBA1C 7.4 (A) 12/12/2021   HGBA1C 6.9 (A) 08/08/2021   Lab Results  Component Value Date   INSULIN 7.9 09/06/2020   Lab Results  Component Value Date    TSH 0.82 01/31/2022   Lab Results  Component Value Date   CHOL 208 (H) 10/03/2022   HDL 34 (L) 10/03/2022   LDLCALC 134 (H) 10/03/2022   LDLDIRECT 125.0 12/26/2018   TRIG 223 (H) 10/03/2022   CHOLHDL 4 01/31/2022   Lab Results  Component Value Date   VD25OH 32.8 10/03/2022   VD25OH 70.2 03/23/2021   VD25OH 55.3 09/06/2020   Lab Results  Component Value Date   WBC 8.6 01/31/2022   HGB 16.7 01/31/2022   HCT 49.1 01/31/2022   MCV 97.5 01/31/2022   PLT 239.0 01/31/2022   Lab Results  Component Value Date   IRON 144 02/08/2020   TIBC 358 02/08/2020   FERRITIN 96 02/08/2020   Attestation Statements:   Reviewed by clinician on day of visit: allergies, medications, problem list, medical history, surgical history, family history, social history, and previous encounter notes.   I, Burt Knack, am acting as transcriptionist for Quillian Quince, MD.  I have reviewed the above documentation for accuracy and completeness, and I agree with the above. -  Quillian Quince, MD

## 2022-12-21 ENCOUNTER — Ambulatory Visit: Payer: BC Managed Care – PPO | Admitting: Internal Medicine

## 2022-12-21 ENCOUNTER — Encounter: Payer: Self-pay | Admitting: Internal Medicine

## 2022-12-21 VITALS — BP 120/68 | HR 79 | Ht 70.0 in | Wt 293.2 lb

## 2022-12-21 DIAGNOSIS — E1159 Type 2 diabetes mellitus with other circulatory complications: Secondary | ICD-10-CM | POA: Diagnosis not present

## 2022-12-21 DIAGNOSIS — Z794 Long term (current) use of insulin: Secondary | ICD-10-CM | POA: Diagnosis not present

## 2022-12-21 DIAGNOSIS — E118 Type 2 diabetes mellitus with unspecified complications: Secondary | ICD-10-CM

## 2022-12-21 DIAGNOSIS — E66813 Obesity, class 3: Secondary | ICD-10-CM | POA: Diagnosis not present

## 2022-12-21 DIAGNOSIS — E785 Hyperlipidemia, unspecified: Secondary | ICD-10-CM

## 2022-12-21 DIAGNOSIS — Z6841 Body Mass Index (BMI) 40.0 and over, adult: Secondary | ICD-10-CM

## 2022-12-21 LAB — POCT GLYCOSYLATED HEMOGLOBIN (HGB A1C): Hemoglobin A1C: 6.8 % — AB (ref 4.0–5.6)

## 2022-12-21 NOTE — Progress Notes (Signed)
Today number Subjective:     Patient ID: Troy [name], male   DOB: 03/07/1971, 52 y.o.   MRN: [id number]  HPI Troy [name] is a pleasant 52 y.o. man, returning for management of DM2, dx 2012, insulin-dependent, with complications (DR), uncontrolled.  Last visit 4 months ago.  Interim history: He continues to work with the weight management clinic.  He lost 7 pounds since last visit No increased urination, blurry vision (has floaters), nausea, chest pain.  Reviewed HbA1c levels: Lab Results  Component Value Date   HGBA1C 7.5 (H) 10/03/2022   HGBA1C 7.7 (A) 09/05/2022   HGBA1C 7.0 (A) 04/17/2022   He is on: - Metformin XR 2000 mg in a.m. 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 He was on Victoza before >> no SEs.   He checks his sugars more than 4 times a day with his CGM:  Previously:  Previously:  Lowest: 60 >> 45 >> 50s x1. Highest: 300s >> 300 >> 200s.  -No CKD: Lab Results  Component Value Date   BUN 13 10/03/2022   BUN 14 01/31/2022   CREATININE 0.78 10/03/2022   CREATININE 0.82 01/31/2022   No MAU: Lab Results  Component Value Date   MICRALBCREAT <4 10/03/2022   MICRALBCREAT 1.0 01/31/2022   MICRALBCREAT 6 02/08/2020   MICRALBCREAT 0.5 12/26/2018   MICRALBCREAT 1.4 07/22/2017   MICRALBCREAT 2.2 12/18/2016   MICRALBCREAT 5.8 10/18/2015   MICRALBCREAT 2.0 03/16/2013   MICRALBCREAT 1.6 11/06/2012   MICRALBCREAT 2.5 07/31/2012  He is not on ACE inhibitor/ARB.  -+ HL: Lab Results  Component Value Date   CHOL 208 (H) 10/03/2022   HDL 34 (L) 10/03/2022   LDLCALC 134 (H) 10/03/2022   LDLDIRECT 125.0 12/26/2018   TRIG 223 (H) 10/03/2022   CHOLHDL 4 01/31/2022  On Lipitor 20 >> 40 >> 80 (increased 12/30/2022).  - Last eye exam was 11/22/2022: +  DR.  He sees a retina specialist.  He started intraocular injections - for macular edema OU.  - No numbness and tingling in feet - sees podiatry.  Previously on gabapentin, then on Lyrica, then off.  Last foot exam 08/30/2022 by Dr. Flossie Dibble.  He has OSA and is compliant with his CPAP. He had a DVT in R leg in 04/2015.  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  He noticed that sugars were higher after he started Eliquis. He lost 45 lbs before 2017 - mostly vegan food.  He has a history of ADD. He did see urology before and was given Viagra, but causes for ED were not explored. We checked his testosterone level.  This returned mildly low: Component     Latest Ref Rng & Units 04/27/2019  Testosterone, Serum (Total)     ng/dL 191 (L)  % Free Testosterone     % 1.9  Free Testosterone, S     pg/mL 50 (L)  Sex Hormone Binding Globulin     nmol/L 34.5  After the above results, I referred him to the weight management clinic. He was started on Citalopram for presumed dx. Of andropause - feels much better - his irritability resolved.  After an episode of syncope, we checked him  for adrenal insufficiency -normal cosyntropin stimulation test: Component     Latest Ref Rng & Units 11/24/2020 11/24/2020 11/24/2020         2:08 PM  2:39 PM  3:05 PM  Cortisol, Plasma     ug/dL 9.1 47.8 29.5   He had epidural steroid injections (02/2020 and 03/2020) and then cervical decompression surgery (diskectomy, fusion) on 05/25/2020. He experienced vertigo in 09/2020, before a diagnosis of COVID-19.  He then had a syncopal episode (CBG 160).  He had milder episodes afterwards - but not recently.  No orthostasis.  We checked him for adrenal insufficiency and investigation was negative after last visit.  He developed leg weakness and had several falls.  He had extensive investigation >> negative. He was referred to cardiology in [place] - now has a diagnosis of dysautonomia (not  POTS). He is on a high salt diet, medication, and compression garment.   Review of Systems: + See HPI   I reviewed pt's medications, allergies, PMH, social hx, family hx, and changes were documented in the history of present illness. Otherwise, unchanged from my initial visit note.  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   Diabetes insipidus ([name])    Diabetes mellitus without complication ([name])    TYPE II   DVT (deep venous thrombosis) ([name])    right leg last time 2020   Dysautonomia ([name]) 07/16/2022   Leg edema, right    Male infertility    Neck injury 12/22/2013   Neuropathy    Obesity    Obesity    OSA (obstructive sleep apnea)    CPAP   Osteoarthritis of spine with radiculopathy, 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   POTS (postural orthostatic tachycardia syndrome) 04/05/2021   Referred otalgia of right ear 07/13/2021   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   Past Surgical History:  Procedure Laterality Date   ANTERIOR CERVICAL DECOMP/DISCECTOMY FUSION N/A 05/25/2020   Procedure: ANTERIOR CERVICAL DECOMPRESSION/DISCECTOMY FUSION, INTERBODY PROSTHESIS, PLATE/SCREWS CERVICAL FIVE- CERVICAL SIX;  Surgeon: Tressie Stalker, MD;  Location: [facility] OR;  Service: Neurosurgery;  Laterality: N/A;   BACK SURGERY     CERVICAL DISC SURGERY     C6/C7 2009   cubital tunnel left arm     2003   ELBOW SURGERY     FIBULA FRACTURE SURGERY     plate & pin removed due to infection 1997   ULNAR NERVE REPAIR     Social History   Socioeconomic History   Marital status: Married    Spouse name: [name]    Number of children: 1   Years of education: Not on file   Highest education level: Bachelor's degree (e.g., BA, AB, BS)  Occupational History   Occupation: Customer Service Lead  Tobacco Use   Smoking status: Never   Smokeless tobacco: Never  Vaping Use   Vaping status: Never Used  Substance and Sexual Activity   Alcohol use: Yes    Comment: 1 - 2 a year   Drug use: No   Sexual  activity: Yes  Other Topics Concern   Not on file  Social History Narrative   Occupation:  days [employer] 11- 8 am  Now changed job and shift to [employer] through Thursday    Working  Ft 40 hours mostly   Married 10 10 10    Wife s/p bariatric surgery pt   Regular exercise- yes   Pt does have children   Father died suddenly May 05, 2008   Mom passes 2017  Acute rep disease after acute renal failure   Daily caffeine use one a day   2 dogs and cat.       Right Handed             Social Determinants of Health   Financial Resource Strain: Medium Risk (06/05/2021)   Overall Financial Resource Strain (CARDIA)    Difficulty of Paying Living Expenses: Somewhat hard  Food Insecurity: Food Insecurity Present (06/05/2021)   Hunger Vital Sign    Worried About Running Out of Food in the Last Year: Sometimes true    Ran Out of Food in the Last Year: Never true  Transportation Needs: No Transportation Needs (06/05/2021)   PRAPARE - Administrator, Civil Service (Medical): No    Lack of Transportation (Non-Medical): No  Physical Activity: Insufficiently Active (06/05/2021)   Exercise Vital Sign    Days of Exercise per Week: 3 days    Minutes of Exercise per Session: 40 min  Stress: No Stress Concern Present (06/05/2021)   [employer] of Occupational Health - Occupational Stress Questionnaire    Feeling of Stress : Only a little  Social Connections: Unknown (06/05/2021)   Social Connection and Isolation Panel [NHANES]    Frequency of Communication with Friends and Family: Twice a week     Frequency of Social Gatherings with Friends and Family: Not on file    Attends Religious Services: Never    Database administrator or Organizations: No    Attends Engineer, structural: Not on file    Marital Status: Married  Catering manager Violence: Not on file   Current Outpatient Medications on File Prior to Visit  Medication Sig Dispense Refill   albuterol (PROVENTIL HFA;VENTOLIN HFA) 108 (90 Base) MCG/ACT inhaler Inhale 2 puffs into the lungs every 6 (six) hours as needed. For wheezing 1 Inhaler 2   atorvastatin (LIPITOR) 80 MG tablet Take 1 tablet (80 mg total) by mouth daily. 90 tablet 0   buPROPion (WELLBUTRIN SR) 150 MG 12 hr tablet Take 1 tablet (150 mg total) by mouth daily. 90 tablet 0   cholecalciferol (VITAMIN D3) 25 MCG (1000 UNIT) tablet Take 1,000 Units by mouth daily.     Continuous Blood Gluc Sensor (DEXCOM G7 SENSOR) MISC 3 each by Does not apply route every 30 (thirty) days. Apply 1 sensor every 10 days 9 each 4   ELIQUIS 5 MG TABS tablet TAKE 1 TABLET TWICE A DAY 180 tablet 2   escitalopram (LEXAPRO) 20 MG tablet Take 1 tablet (20 mg total) by mouth daily. 90 tablet 0   insulin aspart (NOVOLOG) 100 UNIT/ML injection Use 80 units a day in the insulin pump 90 mL 3   Insulin Disposable Pump (V-GO 30) 30 UNIT/24HR KIT USE AND DISCARD 1 V-GO     DEVICE DAILY 90 kit 3   JARDIANCE 25 MG TABS tablet TAKE 1 TABLET BY MOUTH EVERY DAY BEFORE BREAKFAST 90 tablet 0   metFORMIN (GLUCOPHAGE-XR) 500 MG 24  hr tablet TAKE 4 TABLETS DAILY WITH BREAKFAST 360 tablet 3   midodrine (PROAMATINE) 5 MG tablet Take 5 mg by mouth 2 (two) times daily.     nitroGLYCERIN (NITROSTAT) 0.4 MG SL tablet Place 1 tablet (0.4 mg total) under the tongue every 5 (five) minutes as needed for chest pain. 90 tablet 3   Semaglutide, 2 MG/DOSE, (OZEMPIC, 2 MG/DOSE,) 8 MG/3ML SOPN Inject 2 mg into the skin once a week. 9 mL 3   SODIUM CHLORIDE PO Take 4 g by mouth.     No current facility-administered  medications on file prior to visit.   No Known Allergies Family History  Problem Relation Age of Onset   Heart disease Mother    Other Mother        clotting disorders   Diabetes Mother    High blood pressure Mother    Kidney disease Mother    Depression Mother    Sleep apnea Mother    Obesity Mother    Other Father        sudden death/cervical and lumbar disk disease   Aneurysm Father        In his 60s smoked   Hyperlipidemia Father    High blood pressure Father    Sudden death Father    Alcoholism Father    Hypertension Other    Allergies Other    Deep vein thrombosis Other    Sleep apnea Other    Obesity Other     Objective:   Physical Exam BP 120/68   Pulse 79   Ht 5\' 10"  (1.778 m)   Wt 293 lb 3.2 oz (133 kg)   SpO2 95%   BMI 42.07 kg/m   Wt Readings from Last 10 Encounters:  12/21/22 293 lb 3.2 oz (133 kg)  12/19/22 286 lb (129.7 kg)  12/04/22 287 lb (130.2 kg)  11/21/22 290 lb (131.5 kg)  11/01/22 299 lb (135.6 kg)  10/24/22 290 lb (131.5 kg)  10/23/22 299 lb 6.4 oz (135.8 kg)  10/03/22 293 lb (132.9 kg)  09/06/22 300 lb (136.1 kg)  09/05/22 294 lb (133.4 kg)   Constitutional: overweight, in NAD Eyes: EOMI, no exophthalmos ENT: no thyromegaly, no cervical lymphadenopathy Cardiovascular: RRR, No MRG, + RLE swelling -chronic, after his DVT  Respiratory: CTA B Musculoskeletal: no deformities Skin: no rashes Neurological: + Mild tremors with outstretched hands  Assessment:     1. DM2, insulin-dependent, uncontrolled, with complications - DR  2. Obesity class 3  3. HL  Plan:     1. Patient with history of uncontrolled diabetes, on oral antidiabetic regimen with metformin and SGLT2 inhibitor and also weekly GLP-1 receptor agonist along with a V-Go patch pump, with suboptimal control.  He was previously on Mounjaro but switched back to Ozempic as he felt that [facility] did not work well for him.   -At last visit, sugars were quite high, increasing  after every meal, after a period of dietary indiscretions.  HbA1c was also higher, at 7.7%.  He had another HbA1c obtained 1 month later, which was slightly lower, at 7.5%, still above target.  We discussed about taking a higher dose of insulin with larger meals.  We continued the rest of the regimen. CGM interpretation: -At today's visit, we reviewed his CGM downloads: It appears that 79% of values are in target range (goal >70%), while 21% are higher than 180 (goal <25%), and 0% are lower than 70 (goal <4%).  The calculated average blood sugar  is 150.  The projected HbA1c for the next 3 months (GMI) is 6.9%. -Reviewing the CGM trends, sugars appear to be much improved from before.  He continues to have occasional hyperglycemic spikes after meals, especially breakfast and lunch, mostly due to forgetting to do the V-Go clicks before meals.  We did discuss that with larger meals he will need to take more insulin (8 clicks = 16 units), but otherwise, I do not feel we need to change his regimen for now - I advised him to: Patient Instructions  Please continue: - Metformin XR 2000 mg at dinnertime - Jardiance 25 mg before b'fast  - VGo30 4-6 clicks per meal + use 8 clicks for larger meals (use this for meals that increase your blood sugar >50 mg/dl) - Ozempic 2 mg weekly   Please return in 4 months.  - we checked his HbA1c: 6.8% (lower) - advised to check sugars at different times of the day - 4x a day, rotating check times - advised for yearly eye exams >> he is UTD - return to clinic in 4 months  2. Obesity class 3 -continue SGLT 2 inhibitor and GLP-1 receptor agonist which should also help with weight loss -He is seen in the weight management clinic.  Before last visit, he gained weight which he attributes to retaining fluid due to cardiology advising him to increase his sodium intake to help with his dysautonomia. -At last visit, he had dietary indiscretions and gained 6 pounds since the  previous visit -He lost 7 pounds since then  3. HL -He was also found to have coronary artery calcifications and now sees cardiology. -Reviewed latest lipid panel from 09/2022: LDL above target, triglycerides also high, HDL low: Lab Results  Component Value Date   CHOL 208 (H) 10/03/2022   HDL 34 (L) 10/03/2022   LDLCALC 134 (H) 10/03/2022   LDLDIRECT 125.0 12/26/2018   TRIG 223 (H) 10/03/2022   CHOLHDL 4 01/31/2022  -He continues on Lipitor 80 mg daily without side effects (increased yesterday)  Carlus Pavlov, MD PhD [facility] Endocrinology

## 2022-12-21 NOTE — Addendum Note (Signed)
Addended by: Pollie Meyer on: 12/21/2022 01:20 PM   Modules accepted: Orders

## 2022-12-21 NOTE — Patient Instructions (Signed)
Please continue: - Metformin XR 2000 mg at dinnertime - Jardiance 25 mg before b'fast  - VGo30 4-6 clicks per meal + 8 clicks for larger meals (for meals that increase your blood sugar >50) - Ozempic 2 mg weekly   Please return in 4 months.

## 2023-01-03 DIAGNOSIS — E113313 Type 2 diabetes mellitus with moderate nonproliferative diabetic retinopathy with macular edema, bilateral: Secondary | ICD-10-CM | POA: Diagnosis not present

## 2023-01-14 DIAGNOSIS — G901 Familial dysautonomia [Riley-Day]: Secondary | ICD-10-CM | POA: Diagnosis not present

## 2023-01-16 ENCOUNTER — Ambulatory Visit (INDEPENDENT_AMBULATORY_CARE_PROVIDER_SITE_OTHER): Payer: BC Managed Care – PPO | Admitting: Family Medicine

## 2023-01-16 ENCOUNTER — Encounter (INDEPENDENT_AMBULATORY_CARE_PROVIDER_SITE_OTHER): Payer: Self-pay | Admitting: Family Medicine

## 2023-01-16 VITALS — BP 94/62 | HR 112 | Temp 98.0°F | Ht 70.0 in | Wt 281.0 lb

## 2023-01-16 DIAGNOSIS — E118 Type 2 diabetes mellitus with unspecified complications: Secondary | ICD-10-CM

## 2023-01-16 DIAGNOSIS — Z6841 Body Mass Index (BMI) 40.0 and over, adult: Secondary | ICD-10-CM | POA: Diagnosis not present

## 2023-01-16 DIAGNOSIS — E782 Mixed hyperlipidemia: Secondary | ICD-10-CM

## 2023-01-16 DIAGNOSIS — Z7985 Long-term (current) use of injectable non-insulin antidiabetic drugs: Secondary | ICD-10-CM

## 2023-01-16 DIAGNOSIS — E785 Hyperlipidemia, unspecified: Secondary | ICD-10-CM

## 2023-01-16 DIAGNOSIS — Z794 Long term (current) use of insulin: Secondary | ICD-10-CM

## 2023-01-16 DIAGNOSIS — Z7984 Long term (current) use of oral hypoglycemic drugs: Secondary | ICD-10-CM

## 2023-01-16 DIAGNOSIS — A1801 Tuberculosis of spine: Secondary | ICD-10-CM | POA: Diagnosis not present

## 2023-01-16 NOTE — Progress Notes (Signed)
.[facility]  Office: [phone]  /  Fax: [phone]  WEIGHT SUMMARY AND BIOMETRICS  Anthropometric Measurements Height: 5\' 10"  (1.778 m) Weight: 281 lb (127.5 kg) BMI (Calculated): 40.32 Weight at Last Visit: 286 lb Weight Lost Since Last Visit: 5 lb Weight Gained Since Last Visit: 0 Starting Weight: 293 lb Total Weight Loss (lbs): 12 lb (5.443 kg) Peak Weight: 405 lb   Body Composition  Body Fat %: 39.4 % Fat Mass (lbs): 111 lbs Muscle Mass (lbs): 162.4 lbs Total Body Water (lbs): 121.6 lbs Visceral Fat Rating : 24   Other Clinical Data Fasting: No Labs: No Today's Visit #: 42 Starting Date: 09/07/19    Chief Complaint: OBESITY  History of Present Illness   The patient, diagnosed with type 2 diabetes, obesity, and Postural Orthostatic Tachycardia Syndrome (POTS), presents for a routine follow-up. He reports a recent weight loss of five pounds over the past month, attributing this to adherence to a category three eating plan and a weekly walking regimen. Despite these positive changes, the patient's POTS symptoms have not improved, limiting his ability to work to approximately two days per pay period. He describes episodes of syncope and pre-syncope, particularly when changing positions or during prolonged standing, such as when shopping. These episodes have led to a sedentary lifestyle at home, out of fear of falling.  The patient's diabetes control appears to be good, with a recent HbA1c of 6.8 and self-reported blood glucose levels within target ranges 90-94% of the time. He is currently on NovoLog, Ozempic, Jardiance, and Metformin, with no reported side effects.  The effectiveness and tolerability of this new medication are yet to be determined.  Despite the challenges posed by his POTS symptoms, the patient remains committed to his dietary changes and weight loss efforts, expressing a positive outlook on his progress.          PHYSICAL EXAM:  Blood pressure 94/62,  pulse (!) 112, temperature 98 F (36.7 C), height 5\' 10"  (1.778 m), weight 281 lb (127.5 kg), SpO2 96%. Body mass index is 40.32 kg/m.  DIAGNOSTIC DATA REVIEWED:  BMET    Component Value Date/Time   NA 142 10/03/2022 0750   K 4.8 10/03/2022 0750   CL 103 10/03/2022 0750   CO2 24 10/03/2022 0750   GLUCOSE 188 (H) 10/03/2022 0750   GLUCOSE 163 (H) 01/31/2022 1042   BUN 13 10/03/2022 0750   CREATININE 0.78 10/03/2022 0750   CREATININE 0.79 12/26/2018 1052   CALCIUM 9.3 10/03/2022 0750   GFRNONAA >60 10/31/2020 1506   GFRNONAA 106 12/26/2018 1052   GFRAA 125 12/21/2019 1433   GFRAA 123 12/26/2018 1052   Lab Results  Component Value Date   HGBA1C 6.8 (A) 12/21/2022   HGBA1C 7.3 (H) 05/19/2007   Lab Results  Component Value Date   INSULIN 7.9 09/06/2020   Lab Results  Component Value Date   TSH 0.82 01/31/2022   CBC    Component Value Date/Time   WBC 8.6 01/31/2022 1042   RBC 5.03 01/31/2022 1042   HGB 16.7 01/31/2022 1042   HCT 49.1 01/31/2022 1042   PLT 239.0 01/31/2022 1042   MCV 97.5 01/31/2022 1042   MCH 32.7 10/31/2020 1506   MCHC 34.0 01/31/2022 1042   RDW 12.9 01/31/2022 1042   Iron Studies    Component Value Date/Time   IRON 144 02/08/2020 1102   TIBC 358 02/08/2020 1102   FERRITIN 96 02/08/2020 1102   IRONPCTSAT 40 02/08/2020 1102   Lipid Panel  Component Value Date/Time   CHOL 208 (H) 10/03/2022 0750   TRIG 223 (H) 10/03/2022 0750   HDL 34 (L) 10/03/2022 0750   CHOLHDL 4 01/31/2022 1042   VLDL 38.2 01/31/2022 1042   LDLCALC 134 (H) 10/03/2022 0750   LDLCALC 74 02/08/2020 1102   LDLDIRECT 125.0 12/26/2018 1052   Hepatic Function Panel     Component Value Date/Time   PROT 6.8 10/03/2022 0750   ALBUMIN 4.3 10/03/2022 0750   AST 15 10/03/2022 0750   ALT 20 10/03/2022 0750   ALKPHOS 69 10/03/2022 0750   BILITOT 0.4 10/03/2022 0750   BILIDIR 0.2 01/31/2022 1042      Component Value Date/Time   TSH 0.82 01/31/2022 1042    Nutritional Lab Results  Component Value Date   VD25OH 32.8 10/03/2022   VD25OH 70.2 03/23/2021   VD25OH 55.3 09/06/2020     Assessment and Plan    Type 2 Diabetes Mellitus Well controlled with recent A1c of 6.8. On NovoLog, Ozempic, Jardiance, and Metformin. Adherence to category three eating plan and exercise regimen noted. -Continue current medication regimen and will follow closely -Continue diet and exercise regimen.  Obesity Noted 5-pound weight loss since last visit. Adherence to category three eating plan and exercise regimen noted. -Continue diet and exercise regimen. -Consider chair yoga and use of wobble cushion for exercise.  Postural Orthostatic Tachycardia Syndrome (POTS) Symptoms persist despite medication. New medication to be started. -Monitor response to new medication. -Continue exercises to decrease risk of falls.  Hyperlipidemia LDL at 134, goal is below 70. On Atorvastatin 80mg . -Continue Atorvastatin 80mg . -Recheck lipid panel after the holidays.  Follow-up in 4 weeks.          He was informed of the importance of frequent follow up visits to maximize his success with intensive lifestyle modifications for his multiple health conditions.    Quillian Quince, MD

## 2023-01-31 DIAGNOSIS — E113313 Type 2 diabetes mellitus with moderate nonproliferative diabetic retinopathy with macular edema, bilateral: Secondary | ICD-10-CM | POA: Diagnosis not present

## 2023-02-04 ENCOUNTER — Encounter: Payer: BC Managed Care – PPO | Admitting: Internal Medicine

## 2023-02-05 ENCOUNTER — Ambulatory Visit: Payer: BC Managed Care – PPO | Admitting: Internal Medicine

## 2023-02-05 ENCOUNTER — Encounter: Payer: Self-pay | Admitting: Internal Medicine

## 2023-02-05 VITALS — BP 94/70 | HR 101 | Temp 97.5°F | Ht 70.0 in | Wt 286.2 lb

## 2023-02-05 DIAGNOSIS — Z125 Encounter for screening for malignant neoplasm of prostate: Secondary | ICD-10-CM

## 2023-02-05 DIAGNOSIS — Z794 Long term (current) use of insulin: Secondary | ICD-10-CM | POA: Diagnosis not present

## 2023-02-05 DIAGNOSIS — E782 Mixed hyperlipidemia: Secondary | ICD-10-CM | POA: Diagnosis not present

## 2023-02-05 DIAGNOSIS — E1169 Type 2 diabetes mellitus with other specified complication: Secondary | ICD-10-CM

## 2023-02-05 DIAGNOSIS — Z6841 Body Mass Index (BMI) 40.0 and over, adult: Secondary | ICD-10-CM

## 2023-02-05 DIAGNOSIS — G901 Familial dysautonomia [Riley-Day]: Secondary | ICD-10-CM

## 2023-02-05 DIAGNOSIS — Z7901 Long term (current) use of anticoagulants: Secondary | ICD-10-CM

## 2023-02-05 DIAGNOSIS — E118 Type 2 diabetes mellitus with unspecified complications: Secondary | ICD-10-CM

## 2023-02-05 DIAGNOSIS — Z79899 Other long term (current) drug therapy: Secondary | ICD-10-CM | POA: Diagnosis not present

## 2023-02-05 DIAGNOSIS — E785 Hyperlipidemia, unspecified: Secondary | ICD-10-CM

## 2023-02-05 DIAGNOSIS — Z23 Encounter for immunization: Secondary | ICD-10-CM | POA: Diagnosis not present

## 2023-02-05 DIAGNOSIS — Z9181 History of falling: Secondary | ICD-10-CM

## 2023-02-05 DIAGNOSIS — Z0000 Encounter for general adult medical examination without abnormal findings: Secondary | ICD-10-CM | POA: Diagnosis not present

## 2023-02-05 DIAGNOSIS — Z1159 Encounter for screening for other viral diseases: Secondary | ICD-10-CM

## 2023-02-05 LAB — COMPREHENSIVE METABOLIC PANEL
ALT: 26 U/L (ref 0–53)
AST: 19 U/L (ref 0–37)
Albumin: 4.6 g/dL (ref 3.5–5.2)
Alkaline Phosphatase: 70 U/L (ref 39–117)
BUN: 17 mg/dL (ref 6–23)
CO2: 28 meq/L (ref 19–32)
Calcium: 9.7 mg/dL (ref 8.4–10.5)
Chloride: 103 meq/L (ref 96–112)
Creatinine, Ser: 0.79 mg/dL (ref 0.40–1.50)
GFR: 102.15 mL/min (ref >60.00)
Glucose, Bld: 154 mg/dL — ABNORMAL HIGH (ref 70–99)
Potassium: 4.9 meq/L (ref 3.5–5.1)
Sodium: 140 meq/L (ref 135–145)
Total Bilirubin: 0.8 mg/dL (ref 0.2–1.2)
Total Protein: 7.4 g/dL (ref 6.0–8.3)

## 2023-02-05 LAB — CBC WITH DIFFERENTIAL/PLATELET
Basophils Absolute: 0.1 10*3/uL (ref 0.0–0.1)
Basophils Relative: 0.6 % (ref 0.0–3.0)
Eosinophils Absolute: 0.1 10*3/uL (ref 0.0–0.7)
Eosinophils Relative: 0.9 % (ref 0.0–5.0)
HCT: 51.6 % (ref 39.0–52.0)
Hemoglobin: 17.4 g/dL — ABNORMAL HIGH (ref 13.0–17.0)
Lymphocytes Relative: 31.9 % (ref 12.0–46.0)
Lymphs Abs: 3 10*3/uL (ref 0.7–4.0)
MCHC: 33.7 g/dL (ref 30.0–36.0)
MCV: 98.8 fL (ref 78.0–100.0)
Monocytes Absolute: 0.6 10*3/uL (ref 0.1–1.0)
Monocytes Relative: 6.5 % (ref 3.0–12.0)
Neutro Abs: 5.7 10*3/uL (ref 1.4–7.7)
Neutrophils Relative %: 60.1 % (ref 43.0–77.0)
Platelets: 257 10*3/uL (ref 150.0–400.0)
RBC: 5.22 Mil/uL (ref 4.22–5.81)
RDW: 13.2 % (ref 11.5–15.5)
WBC: 9.5 10*3/uL (ref 4.0–10.5)

## 2023-02-05 LAB — LIPID PANEL
Cholesterol: 160 mg/dL (ref 0–200)
HDL: 39.4 mg/dL (ref >39.00)
LDL Cholesterol: 78 mg/dL (ref 0–99)
NonHDL: 120.24
Total CHOL/HDL Ratio: 4
Triglycerides: 210 mg/dL — ABNORMAL HIGH (ref 0.0–149.0)
VLDL: 42 mg/dL — ABNORMAL HIGH (ref 0.0–40.0)

## 2023-02-05 LAB — TSH: TSH: 1.13 u[IU]/mL (ref 0.35–5.50)

## 2023-02-05 LAB — MICROALBUMIN / CREATININE URINE RATIO
Creatinine,U: 96.4 mg/dL
Microalb Creat Ratio: 1 mg/g (ref 0.0–30.0)
Microalb, Ur: 0.9 mg/dL (ref 0.0–1.9)

## 2023-02-05 LAB — PSA: PSA: 0.39 ng/mL (ref 0.10–4.00)

## 2023-02-05 NOTE — Progress Notes (Signed)
Chief Complaint  Patient presents with   Annual Exam    HPI: Patient  Troy [name]  52 y.o. comes in today for Preventive Health Care visit  And fu Chronic disease management   DM  q 4 months  check from endo seems in control  Chronic DVT   on eliquis  rle no bleeding  Obesity   weight management  ozempic  jjardiance metformin per   Dr [name]  Radiculopathy and sp surgery c spine and residual  POTS like sx  felt to be post covid / dysautonomia sees specialist in [place] . Vision  macular edema following some dec vision .  Falling  episodes  is ongoing frequent and ? Unpredictable  fut no new injury . No loc . Presumed  pots   has specialist in [place].   Effecting work hours and limited  time causing financial strain  and concern  no cp sob    not getting better over year since covid supposed trigger . [name]   Health Maintenance  Topic Date Due   Hepatitis C Screening  Never done   Colonoscopy  08/05/2023 (Originally 04/15/2023)   COVID-19 Vaccine (5 - 2023-24 season) 08/05/2023 (Originally 11/11/2022)   Zoster Vaccines- Shingrix (2 of 2) 04/02/2023   HEMOGLOBIN A1C  06/21/2023   FOOT EXAM  08/30/2023   OPHTHALMOLOGY EXAM  11/22/2023   Diabetic kidney evaluation - eGFR measurement  02/05/2024   Diabetic kidney evaluation - Urine ACR  02/05/2024   DTaP/Tdap/Td (4 - Tdap) 07/23/2027   INFLUENZA VACCINE  Completed   HIV Screening  Completed   HPV VACCINES  Aged Out   Health Maintenance Review LIFESTYLE:  Exercise:   limited cause of  medical issues  Tobacco/ETS:n Alcohol:  stopped  Sugar beverages: no Sleep: ok 6- 7  osa  Drug use: no HH of 3  lots of  dogs and cats  Work: has gone to dec 5 days this month cause of sx and falling risk .     ROS:  GEN/ HEENT: No fever, significant weight changes sweats headaches vision problems hearing changes, CV/ PULM; No chest pain shortness of breath cough, syncope,edema  change in exercise tolerance. GI /GU: No adominal pain, vomiting, change  in bowel habits. No blood in the stool. No significant GU symptoms. SKIN/HEME: ,no acute skin rashes suspicious lesions or bleeding. No lymphadenopathy, nodules, masses.  NEURO/ PSYCH:  No neurologic signs such as weakness numbness. No depression anxiety. IMM/ Allergy: No unusual infections.  Allergy .   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   Diabetes insipidus ([name])    Diabetes mellitus without complication ([name])    TYPE II   DVT (deep venous thrombosis) ([name])    right leg last time 2020   Dysautonomia ([name]) 07/16/2022   Leg edema, right    Male infertility    Neck injury 12/22/2013   Neuropathy    Obesity  Obesity    OSA (obstructive sleep apnea)    CPAP   Osteoarthritis of spine with radiculopathy, 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   POTS (postural orthostatic tachycardia syndrome) 04/05/2021   Referred otalgia of right ear 07/13/2021   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    Past Surgical History:  Procedure Laterality Date   ANTERIOR CERVICAL DECOMP/DISCECTOMY FUSION N/A 05/25/2020   Procedure: ANTERIOR CERVICAL DECOMPRESSION/DISCECTOMY FUSION, INTERBODY PROSTHESIS, PLATE/SCREWS CERVICAL FIVE- CERVICAL SIX;  Surgeon: Tressie Stalker, MD;  Location: [facility] OR;  Service: Neurosurgery;  Laterality: N/A;   BACK SURGERY     CERVICAL DISC SURGERY     C6/C7 2009   cubital tunnel left arm     2003   ELBOW SURGERY     FIBULA FRACTURE SURGERY     plate & pin removed due to infection 1997   ULNAR NERVE REPAIR      Family History   Problem Relation Age of Onset   Heart disease Mother    Other Mother        clotting disorders   Diabetes Mother    High blood pressure Mother    Kidney disease Mother    Depression Mother    Sleep apnea Mother    Obesity Mother    Other Father        sudden death/cervical and lumbar disk disease   Aneurysm Father        In his 37s smoked   Hyperlipidemia Father    High blood pressure Father    Sudden death Father    Alcoholism Father    Hypertension Other    Allergies Other    Deep vein thrombosis Other    Sleep apnea Other    Obesity Other     Social History   Socioeconomic History   Marital status: Married    Spouse name: Troy [name]   Number of children: 1   Years of education: Not on file   Highest education level: Bachelor's degree (e.g., BA, AB, BS)  Occupational History   Occupation: Customer Service Lead  Tobacco Use   Smoking status: Never   Smokeless tobacco: Never  Vaping Use   Vaping status: Never Used  Substance and Sexual Activity   Alcohol use: Yes    Comment: 1 - 2 a year   Drug use: No   Sexual activity: Yes  Other Topics Concern   Not on file  Social History Narrative   Occupation:  days [name]   Worked 11- 8 am  Now changed job and shift to [employer] through Thursday    Working  Ft 40 hours mostly   Married 10 10 10    Wife s/p bariatric surgery pt   Regular exercise- yes   Pt does have children   Father died suddenly May 04, 2008   Mom passes 2017  Acute rep disease after acute renal failure   Daily caffeine use one a day   2 dogs and cat.       Right Handed             Social Determinants of Health   Financial Resource Strain: High Risk (02/05/2023)   Overall Financial Resource Strain (CARDIA)    Difficulty of Paying Living Expenses: Hard  Food Insecurity: No Food Insecurity (02/05/2023)   Hunger Vital Sign    Worried About Running Out of Food in the Last Year: Never true    Ran Out of Food in the Last Year: Never true   Transportation Needs: No Transportation Needs (  02/05/2023)   PRAPARE - Administrator, Civil Service (Medical): No    Lack of Transportation (Non-Medical): No  Physical Activity: Insufficiently Active (02/05/2023)   Exercise Vital Sign    Days of Exercise per Week: 1 day    Minutes of Exercise per Session: 30 min  Stress: No Stress Concern Present (02/05/2023)   [employer] of Occupational Health - Occupational Stress Questionnaire    Feeling of Stress : Not at all  Social Connections: Moderately Isolated (02/05/2023)   Social Connection and Isolation Panel [NHANES]    Frequency of Communication with Friends and Family: Three times a week    Frequency of Social Gatherings with Friends and Family: Never    Attends Religious Services: Never    Database administrator or Organizations: No    Attends Banker Meetings: Never    Marital Status: Married    Outpatient Medications Prior to Visit  Medication Sig Dispense Refill   albuterol (PROVENTIL HFA;VENTOLIN HFA) 108 (90 Base) MCG/ACT inhaler Inhale 2 puffs into the lungs every 6 (six) hours as needed. For wheezing 1 Inhaler 2   atorvastatin (LIPITOR) 80 MG tablet Take 1 tablet (80 mg total) by mouth daily. 90 tablet 0   buPROPion (WELLBUTRIN SR) 150 MG 12 hr tablet Take 1 tablet (150 mg total) by mouth daily. 90 tablet 0   cholecalciferol (VITAMIN D3) 25 MCG (1000 UNIT) tablet Take 1,000 Units by mouth daily.     Continuous Blood Gluc Sensor (DEXCOM G7 SENSOR) MISC 3 each by Does not apply route every 30 (thirty) days. Apply 1 sensor every 10 days 9 each 4   ELIQUIS 5 MG TABS tablet TAKE 1 TABLET TWICE A DAY 180 tablet 2   escitalopram (LEXAPRO) 20 MG tablet Take 1 tablet (20 mg total) by mouth daily. 90 tablet 0   fludrocortisone (FLORINEF) 0.1 MG tablet Take 0.05 mg by mouth daily.     insulin aspart (NOVOLOG) 100 UNIT/ML injection Use 80 units a day in the insulin pump 90 mL 3   Insulin Disposable Pump  (V-GO 30) 30 UNIT/24HR KIT USE AND DISCARD 1 V-GO     DEVICE DAILY 90 kit 3   JARDIANCE 25 MG TABS tablet TAKE 1 TABLET BY MOUTH EVERY DAY BEFORE BREAKFAST 90 tablet 0   metFORMIN (GLUCOPHAGE-XR) 500 MG 24 hr tablet TAKE 4 TABLETS DAILY WITH BREAKFAST 360 tablet 3   Semaglutide, 2 MG/DOSE, (OZEMPIC, 2 MG/DOSE,) 8 MG/3ML SOPN Inject 2 mg into the skin once a week. 9 mL 3   SODIUM CHLORIDE PO Take 4 g by mouth.     midodrine (PROAMATINE) 2.5 MG tablet Take 2.5 mg by mouth 3 (three) times daily. (Patient not taking: Reported on 02/05/2023)     midodrine (PROAMATINE) 5 MG tablet Take 5 mg by mouth 2 (two) times daily. (Patient not taking: Reported on 01/16/2023)     nitroGLYCERIN (NITROSTAT) 0.4 MG SL tablet Place 1 tablet (0.4 mg total) under the tongue every 5 (five) minutes as needed for chest pain. (Patient not taking: Reported on 02/05/2023) 90 tablet 3   No facility-administered medications prior to visit.     EXAM:  BP 94/70 (BP Location: Right Arm, Patient Position: Sitting, Cuff Size: Large)   Pulse (!) 101   Temp (!) 97.5 F (36.4 C) (Oral)   Ht 5\' 10"  (1.778 m)   Wt 286 lb 3.2 oz (129.8 kg)   SpO2 96%   BMI 41.07 kg/m  Body mass index is 41.07 kg/m. Wt Readings from Last 3 Encounters:  02/05/23 286 lb 3.2 oz (129.8 kg)  01/16/23 281 lb (127.5 kg)  12/21/22 293 lb 3.2 oz (133 kg)    Physical Exam: Vital signs reviewed WUJ:WJXB is a well-developed well-nourished alert cooperative    who appearsr stated age in no acute distress.  HEENT: normocephalic atraumatic , Eyes: PERRL EOM's full, conjunctiva clear, Nares: paten,t no deformity discharge or tenderness., Ears: no deformity EAC's clear TMs with normal landmarks. Mouth: clear OP, no lesions, edema.  Moist mucous membranes. Dentition in adequate repair. NECK: supple without masses, thyromegaly or bruits. CHEST/PULM:  Clear to auscultation and percussion breath sounds equal no wheeze , rales or rhonchi. No chest wall  deformities or tenderness. CV: PMI is nondisplaced, S1 S2 no gallops, murmurs, rubs. Peripheral pulses are present without delay.No JVD .  ABDOMEN: Bowel sounds normal nontender  No guard or rebound, no hepato splenomegal no CVA tenderness.  . Extremtities:  No clubbing cyanosis or edema, no acute joint swelling or redness rue atrophy ( old)  rle  slightly larger than left  no acute findings  redness chornic change s Feet no lesion  NEURO:  Oriented x3, cranial nerves 3-12 appear to be intact,  mild facial asymmetry no ,gait within normal limits lue  dec muscle mass  SKIN: No acute rashes normal turgor, color, no bruising or petechiae. PSYCH: Oriented, good eye contact, no obvious depression anxiety, cognition and judgment appear normal. LN: no cervical axillary adenopathy  Lab Results  Component Value Date   WBC 9.5 02/05/2023   HGB 17.4 (H) 02/05/2023   HCT 51.6 02/05/2023   PLT 257.0 02/05/2023   GLUCOSE 154 (H) 02/05/2023   CHOL 160 02/05/2023   TRIG 210.0 (H) 02/05/2023   HDL 39.40 02/05/2023   LDLDIRECT 125.0 12/26/2018   LDLCALC 78 02/05/2023   ALT 26 02/05/2023   AST 19 02/05/2023   NA 140 02/05/2023   K 4.9 02/05/2023   CL 103 02/05/2023   CREATININE 0.79 02/05/2023   BUN 17 02/05/2023   CO2 28 02/05/2023   TSH 1.13 02/05/2023   PSA 0.39 02/05/2023   HGBA1C 6.8 (A) 12/21/2022   MICROALBUR 0.9 02/05/2023    BP Readings from Last 3 Encounters:  02/05/23 94/70  01/16/23 94/62  12/21/22 120/68    Lab plan reviewed with patient  Update   ASSESSMENT AND PLAN:  Discussed the following assessment and plan:    ICD-10-CM   1. Visit for preventive health examination  Z00.00 Flu vaccine trivalent PF, 6mos and older(Flulaval,Afluria,Fluarix,Fluzone)    Zoster Recombinant (Shingrix )    CBC with Differential/Platelet    Comprehensive metabolic panel    Lipid panel    PSA    TSH    Hep C Antibody    2. Medication management  Z79.899 CBC with Differential/Platelet     Comprehensive metabolic panel    Lipid panel    TSH    Microalbumin / creatinine urine ratio    3. Influenza vaccine needed  Z23 Flu vaccine trivalent PF, 6mos and older(Flulaval,Afluria,Fluarix,Fluzone)    4. Need for shingles vaccine  Z23 Zoster Recombinant (Shingrix )    5. Mixed hyperlipidemia  E78.2 CBC with Differential/Platelet    Comprehensive metabolic panel    Lipid panel    TSH    6. Type 2 diabetes mellitus with complication, with long-term current use of insulin ([name])  E11.8 CBC with Differential/Platelet   Z79.4 Comprehensive metabolic panel  Lipid panel    TSH    Microalbumin / creatinine urine ratio    7. BMI 40.0-44.9, adult ([name])  Z68.41 CBC with Differential/Platelet    Comprehensive metabolic panel    Lipid panel    TSH    8. Chronic anticoagulation  Z79.01 CBC with Differential/Platelet    Comprehensive metabolic panel    Lipid panel    TSH    9. Dysautonomia ([name])  G90.1 CBC with Differential/Platelet    Comprehensive metabolic panel    Lipid panel    TSH   presumed cause of falling    10. Hyperlipidemia associated with type 2 diabetes mellitus ([name])  E11.69 CBC with Differential/Platelet   E78.5 Comprehensive metabolic panel    Lipid panel    TSH    11. Need for hepatitis C screening test  Z11.59     12. Screening PSA (prostate specific antigen)  Z12.5 PSA    13. History of falling  Z91.81     Multiple conditions  unfortunately causing dec work  day s  causing financial distress  Consider having his specialists make a  updated opinion  to make sure no other reversible contributors .  Dm in current control  Immuniz  update   Return in about 6 months (around 08/05/2023) for depending on results.  Patient Care Team: Laurie Penado, Neta Mends, MD as PCP - General (Internal Medicine) Carlus Pavlov, MD as Consulting Physician (Internal Medicine) Glendale Chard, DO as Consulting Physician (Neurology) Patient Instructions  Updated lab today   Flu vaccine and shingrix.  Make sure you have recent  updated fu with  neurology and cardiology about the falling . Presumed from dysautonomia .  To have them confirm opinion that  nothing else contributing ( ie meds other conditions)  Lab today .  (? Would a 24 hours BP monitor  be helpful ? To gather updated data)  Neta Mends. Patsye Sullivant M.D.

## 2023-02-05 NOTE — Patient Instructions (Addendum)
Updated lab today  Flu vaccine and shingrix.  Make sure you have recent  updated fu with  neurology and cardiology about the falling . Presumed from dysautonomia .  To have them confirm opinion that  nothing else contributing ( ie meds other conditions)  Lab today .  (? Would a 24 hours BP monitor  be helpful ? To gather updated data)

## 2023-02-06 LAB — HEPATITIS C ANTIBODY: Hepatitis C Ab: NONREACTIVE

## 2023-02-11 NOTE — Progress Notes (Signed)
Results  stable or in range , hep c screen negative  Sharing info with dr Elvera Lennox

## 2023-02-12 DIAGNOSIS — I951 Orthostatic hypotension: Secondary | ICD-10-CM | POA: Diagnosis not present

## 2023-02-12 DIAGNOSIS — G909 Disorder of the autonomic nervous system, unspecified: Secondary | ICD-10-CM | POA: Diagnosis not present

## 2023-02-17 ENCOUNTER — Other Ambulatory Visit (INDEPENDENT_AMBULATORY_CARE_PROVIDER_SITE_OTHER): Payer: Self-pay | Admitting: Family Medicine

## 2023-02-17 DIAGNOSIS — F3289 Other specified depressive episodes: Secondary | ICD-10-CM

## 2023-02-20 ENCOUNTER — Ambulatory Visit (INDEPENDENT_AMBULATORY_CARE_PROVIDER_SITE_OTHER): Payer: BC Managed Care – PPO | Admitting: Family Medicine

## 2023-02-20 ENCOUNTER — Encounter (INDEPENDENT_AMBULATORY_CARE_PROVIDER_SITE_OTHER): Payer: Self-pay | Admitting: Family Medicine

## 2023-02-20 VITALS — BP 113/64 | HR 97 | Temp 97.8°F | Ht 70.0 in | Wt 289.0 lb

## 2023-02-20 DIAGNOSIS — G901 Familial dysautonomia [Riley-Day]: Secondary | ICD-10-CM | POA: Diagnosis not present

## 2023-02-20 DIAGNOSIS — E782 Mixed hyperlipidemia: Secondary | ICD-10-CM | POA: Diagnosis not present

## 2023-02-20 DIAGNOSIS — Z7985 Long-term (current) use of injectable non-insulin antidiabetic drugs: Secondary | ICD-10-CM

## 2023-02-20 DIAGNOSIS — E119 Type 2 diabetes mellitus without complications: Secondary | ICD-10-CM

## 2023-02-20 DIAGNOSIS — Z6841 Body Mass Index (BMI) 40.0 and over, adult: Secondary | ICD-10-CM

## 2023-02-20 DIAGNOSIS — Z794 Long term (current) use of insulin: Secondary | ICD-10-CM

## 2023-02-20 DIAGNOSIS — R42 Dizziness and giddiness: Secondary | ICD-10-CM

## 2023-02-20 DIAGNOSIS — I959 Hypotension, unspecified: Secondary | ICD-10-CM

## 2023-02-20 DIAGNOSIS — E669 Obesity, unspecified: Secondary | ICD-10-CM

## 2023-02-20 DIAGNOSIS — Z7984 Long term (current) use of oral hypoglycemic drugs: Secondary | ICD-10-CM

## 2023-02-20 NOTE — Progress Notes (Signed)
.smr  Office: [phone]  /  Fax: [phone]  WEIGHT SUMMARY AND BIOMETRICS  Anthropometric Measurements Height: 5\' 10"  (1.778 m) Weight: 289 lb (131.1 kg) BMI (Calculated): 41.47 Weight at Last Visit: 281 lb Weight Lost Since Last Visit: 0 Weight Gained Since Last Visit: 8 lb Starting Weight: 293 lb Total Weight Loss (lbs): 4 lb (1.814 kg) Peak Weight: 405 lb   Body Composition  Body Fat %: 41.1 % Fat Mass (lbs): 118.8 lbs Muscle Mass (lbs): 161.8 lbs Total Body Water (lbs): 128.4 lbs Visceral Fat Rating : 25   Other Clinical Data Fasting: Yes Labs: No Today's Visit #: 65 Starting Date: 09/07/19    Chief Complaint: OBESITY   History of Present Illness   The patient, with a history of type 2 diabetes and obesity, presents for a routine follow-up. He is currently on Ozempic 2 mg weekly, Novolog 80 units daily via an insulin pump, Jardiance 25 mg daily, and Metformin 500 mg four tablets daily with breakfast. The patient has gained eight pounds in the last month. He reports adherence to a category three eating plan about 60% of the time and is currently not exercising. His most recent hemoglobin A1c was 6.8 in October of this year. He notes an increase in temptations at home and is not getting much support from his family. He has been unable to follow his catagory 3 eating plan very well but has tried to be mindful of portions.   The patient has also been experiencing episodes of falls, which he attributes to low blood pressure and dysautonomia. He has been monitoring his blood pressure using a watch, which shows significant fluctuations throughout the day, even during sleep. He is currently on fludrocortisone 0.2 mg for this issue. He has been seeing cardiology regularly for this issue.  The patient also reports frequent feelings of thirst, consuming over 120-150 ounces of water daily. Despite efforts to control his diet, he admits to occasional indulgences due to family  circumstances. He has not experienced any low blood sugars recently. His cholesterol levels have improved on Atorvastatin, but triglycerides remain high.          PHYSICAL EXAM:  Blood pressure 113/64, pulse 97, temperature 97.8 F (36.6 C), height 5\' 10"  (1.778 m), weight 289 lb (131.1 kg), SpO2 97%. Body mass index is 41.47 kg/m.  DIAGNOSTIC DATA REVIEWED:  BMET    Component Value Date/Time   NA 140 02/05/2023 1030   NA 142 10/03/2022 0750   K 4.9 02/05/2023 1030   CL 103 02/05/2023 1030   CO2 28 02/05/2023 1030   GLUCOSE 154 (H) 02/05/2023 1030   BUN 17 02/05/2023 1030   BUN 13 10/03/2022 0750   CREATININE 0.79 02/05/2023 1030   CREATININE 0.79 12/26/2018 1052   CALCIUM 9.7 02/05/2023 1030   GFRNONAA >60 10/31/2020 1506   GFRNONAA 106 12/26/2018 1052   GFRAA 125 12/21/2019 1433   GFRAA 123 12/26/2018 1052   Lab Results  Component Value Date   HGBA1C 6.8 (A) 12/21/2022   HGBA1C 7.3 (H) 05/19/2007   Lab Results  Component Value Date   INSULIN 7.9 09/06/2020   Lab Results  Component Value Date   TSH 1.13 02/05/2023   CBC    Component Value Date/Time   WBC 9.5 02/05/2023 1030   RBC 5.22 02/05/2023 1030   HGB 17.4 (H) 02/05/2023 1030   HCT 51.6 02/05/2023 1030   PLT 257.0 02/05/2023 1030   MCV 98.8 02/05/2023 1030   MCH 32.7  10/31/2020 1506   MCHC 33.7 02/05/2023 1030   RDW 13.2 02/05/2023 1030   Iron Studies    Component Value Date/Time   IRON 144 02/08/2020 1102   TIBC 358 02/08/2020 1102   FERRITIN 96 02/08/2020 1102   IRONPCTSAT 40 02/08/2020 1102   Lipid Panel     Component Value Date/Time   CHOL 160 02/05/2023 1030   CHOL 208 (H) 10/03/2022 0750   TRIG 210.0 (H) 02/05/2023 1030   HDL 39.40 02/05/2023 1030   HDL 34 (L) 10/03/2022 0750   CHOLHDL 4 02/05/2023 1030   VLDL 42.0 (H) 02/05/2023 1030   LDLCALC 78 02/05/2023 1030   LDLCALC 134 (H) 10/03/2022 0750   LDLCALC 74 02/08/2020 1102   LDLDIRECT 125.0 12/26/2018 1052   Hepatic  Function Panel     Component Value Date/Time   PROT 7.4 02/05/2023 1030   PROT 6.8 10/03/2022 0750   ALBUMIN 4.6 02/05/2023 1030   ALBUMIN 4.3 10/03/2022 0750   AST 19 02/05/2023 1030   ALT 26 02/05/2023 1030   ALKPHOS 70 02/05/2023 1030   BILITOT 0.8 02/05/2023 1030   BILITOT 0.4 10/03/2022 0750   BILIDIR 0.2 01/31/2022 1042      Component Value Date/Time   TSH 1.13 02/05/2023 1030   Nutritional Lab Results  Component Value Date   VD25OH 32.8 10/03/2022   VD25OH 70.2 03/23/2021   VD25OH 55.3 09/06/2020     Assessment and Plan    Type 2 Diabetes Mellitus Type 2 diabetes mellitus, managed with Ozempic 2 mg weekly, Novolog 80 units daily via insulin pump, Jardiance 25 mg daily, and metformin 500 mg 4 tablets daily. Recent hemoglobin A1c was 6.8% in October. Blood sugars well-managed with no recent hypoglycemic episodes. Reports adherence to medication regimen but partial adherence to dietary recommendations and no current exercise routine. Discussed the importance of diet and exercise in managing blood sugar levels and preventing complications. - Continue Ozempic 2 mg weekly, Novolog 80 units daily via insulin pump, Jardiance 25 mg daily, metformin 500 mg 4 tablets daily - Encourage adherence to dietary recommendations and increase physical activity - Schedule follow-up in 8 weeks  Obesity Gained 8 pounds in the last month. Reports partial adherence to dietary plan and no current exercise. Discussed challenges with dietary temptations at home and during holidays. Emphasized the importance of weight loss in managing diabetes and overall health. - Encourage adherence to dietary plan and increase physical activity - Discuss strategies to manage dietary temptations at home and during holidays - Schedule follow-up in 8 weeks  Hypotension Reports episodes of low blood pressure causing dizziness and near-fainting. Currently on fludrocortisone 0.1 mg twice daily. Reports variability  in blood pressure readings, especially with physical activity. Cardiologist recommended 24-hour ambulatory blood pressure monitoring, but this has not been set up yet. Discussed the importance of monitoring and managing blood pressure to prevent falls and other complications. - Continue fludrocortisone 0.1 mg twice daily - Encourage hydration - Follow up with cardiologist's nurse practitioner in March - Schedule 24-hour ambulatory blood pressure monitoring if possible  Dysautonomia Reports symptoms consistent with dysautonomia, including dizziness and near-fainting upon standing. Symptoms exacerbated by physical activity. Discussed the importance of managing symptoms to prevent falls and improve quality of life. - Continue current management and monitor symptoms - Follow up with cardiologist's nurse practitioner in March  Hypertriglyceridemia Elevated triglycerides, common in diabetics. Currently managed with atorvastatin, which has successfully lowered cholesterol levels. Discussed the importance of dietary modifications to reduce triglycerides. -  Continue atorvastatin - Encourage dietary modifications to reduce simple carbohydrates  General Health Maintenance Discussed the importance of hydration, especially in dry heat, to manage fluid retention. Drinking over 120-150 ounces of water daily. Discussed the importance of monitoring fluid retention and adjusting sodium intake as needed. - Encourage continued hydration - Monitor fluid retention and adjust sodium intake as needed  Follow-up - Schedule follow-up appointment in 8 weeks - Contact if any issues arise before  the next appointment.         I have personally spent 35 minutes total time today in preparation, patient care, and documentation for this visit, including the following: review of clinical lab tests; review of medical tests/procedures/services.    He was informed of the importance of frequent follow up visits to maximize  his success with intensive lifestyle modifications for his multiple health conditions.    Quillian Quince, MD

## 2023-02-21 ENCOUNTER — Other Ambulatory Visit (INDEPENDENT_AMBULATORY_CARE_PROVIDER_SITE_OTHER): Payer: Self-pay | Admitting: Family Medicine

## 2023-02-21 DIAGNOSIS — F3289 Other specified depressive episodes: Secondary | ICD-10-CM

## 2023-02-26 DIAGNOSIS — G4733 Obstructive sleep apnea (adult) (pediatric): Secondary | ICD-10-CM | POA: Diagnosis not present

## 2023-02-27 ENCOUNTER — Telehealth: Payer: Self-pay | Admitting: Internal Medicine

## 2023-02-27 NOTE — Telephone Encounter (Signed)
FMLA forms to be filled out--placed in dr's folder.  Fax to [phone] upon completion.   *Attached is two sheets with how Dr. Catheryn Bacon filled out the forms last time.

## 2023-02-27 NOTE — Telephone Encounter (Signed)
Attempted to reach pt to get more info on the dates. Left a voicemail to call us back.

## 2023-02-28 DIAGNOSIS — E113313 Type 2 diabetes mellitus with moderate nonproliferative diabetic retinopathy with macular edema, bilateral: Secondary | ICD-10-CM | POA: Diagnosis not present

## 2023-02-28 NOTE — Telephone Encounter (Signed)
Attempted to reach pt. Left a detail message that a message will send to his mychart.

## 2023-02-28 NOTE — Telephone Encounter (Signed)
Copied from CRM [id number]. Topic: General - Other >> Feb 27, 2023  5:27 PM Denese Killings wrote: Reason for CRM: Patient is returning a call regarding FMLA Paperwork.  Please advise.

## 2023-03-01 ENCOUNTER — Other Ambulatory Visit (INDEPENDENT_AMBULATORY_CARE_PROVIDER_SITE_OTHER): Payer: Self-pay | Admitting: Family Medicine

## 2023-03-01 DIAGNOSIS — F3289 Other specified depressive episodes: Secondary | ICD-10-CM

## 2023-03-03 NOTE — Progress Notes (Signed)
Virtual Visit via Video Note  I connected with Troy [name] on 03/11/23 at  8:30 AM EST by a video enabled telemedicine application and verified that I am speaking with the correct person using two identifiers. Location patient: home Location provider:work office Persons participating in the virtual visit: patient, provider    Patient aware  of the limitations of evaluation and management by telemedicine and  availability of in person appointments. and agreed to proceed.   HPI: Troy [name] presents for video visit he needs an updated  fmla form  ongoing eye injections  and  uncontrolled falling  he describes as legs giving  out.   Also getting  more evaluationabout the dysautonomia cards.  Gets reg check with specialists .  To see dr Judithe Modest  in [place]. No new weakness or change in neck status   ROS: See pertinent positives and negatives per HPI.  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   Diabetes insipidus ([name])    Diabetes mellitus without complication ([name])    TYPE II   DVT (deep venous thrombosis) ([name])    right leg last time 2020   Dysautonomia ([name]) 07/16/2022   Leg edema, right    Male infertility    Neck injury 12/22/2013   Neuropathy    Obesity    Obesity    OSA (obstructive sleep apnea)    CPAP   Osteoarthritis of spine with radiculopathy, 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   POTS (postural orthostatic tachycardia syndrome) 04/05/2021   Referred otalgia of right ear 07/13/2021   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    Past Surgical History:  Procedure Laterality Date    ANTERIOR CERVICAL DECOMP/DISCECTOMY FUSION N/A 05/25/2020   Procedure: ANTERIOR CERVICAL DECOMPRESSION/DISCECTOMY FUSION, INTERBODY PROSTHESIS, PLATE/SCREWS CERVICAL FIVE- CERVICAL SIX;  Surgeon: Tressie Stalker, MD;  Location: [facility]  OR;  Service: Neurosurgery;  Laterality: N/A;   BACK SURGERY     CERVICAL DISC SURGERY     C6/C7 2009   cubital tunnel left arm     2003   ELBOW SURGERY     FIBULA FRACTURE SURGERY     plate & pin removed due to infection 1997   ULNAR NERVE REPAIR      Family History  Problem Relation Age of Onset   Heart disease Mother    Other Mother        clotting disorders   Diabetes Mother    High blood pressure Mother    Kidney disease Mother    Depression Mother    Sleep apnea Mother    Obesity Mother    Other Father        sudden death/cervical and lumbar disk disease   Aneurysm Father        In his 60s smoked   Hyperlipidemia Father    High blood pressure Father    Sudden death Father    Alcoholism Father    Hypertension Other    Allergies Other    Deep vein thrombosis Other    Sleep  apnea Other    Obesity Other     Social History   Tobacco Use   Smoking status: Never   Smokeless tobacco: Never  Vaping Use   Vaping status: Never Used  Substance Use Topics   Alcohol use: Yes    Comment: 1 - 2 a year   Drug use: No      Current Outpatient Medications:    albuterol (PROVENTIL HFA;VENTOLIN HFA) 108 (90 Base) MCG/ACT inhaler, Inhale 2 puffs into the lungs every 6 (six) hours as needed. For wheezing, Disp: 1 Inhaler, Rfl: 2   atorvastatin (LIPITOR) 80 MG tablet, Take 1 tablet (80 mg total) by mouth daily., Disp: 90 tablet, Rfl: 0   buPROPion (WELLBUTRIN SR) 150 MG 12 hr tablet, Take 1 tablet (150 mg total) by mouth daily., Disp: 90 tablet, Rfl: 0   cholecalciferol (VITAMIN D3) 25 MCG (1000 UNIT) tablet, Take 1,000 Units by mouth daily., Disp: , Rfl:    Continuous Blood Gluc Sensor (DEXCOM G7 SENSOR) MISC, 3 each by Does not apply route every  30 (thirty) days. Apply 1 sensor every 10 days, Disp: 9 each, Rfl: 4   ELIQUIS 5 MG TABS tablet, TAKE 1 TABLET TWICE A DAY, Disp: 180 tablet, Rfl: 2   empagliflozin (JARDIANCE) 25 MG TABS tablet, TAKE 1 TABLET BY MOUTH EVERY DAY BEFORE BREAKFAST, Disp: 90 tablet, Rfl: 3   escitalopram (LEXAPRO) 20 MG tablet, Take 1 tablet (20 mg total) by mouth daily., Disp: 90 tablet, Rfl: 0   fludrocortisone (FLORINEF) 0.1 MG tablet, Take 0.1 mg by mouth 2 (two) times daily., Disp: , Rfl:    insulin aspart (NOVOLOG) 100 UNIT/ML injection, Use 80 units a day in the insulin pump, Disp: 90 mL, Rfl: 3   Insulin Disposable Pump (V-GO 30) 30 UNIT/24HR KIT, USE AND DISCARD 1 V-GO     DEVICE DAILY, Disp: 90 kit, Rfl: 3   metFORMIN (GLUCOPHAGE-XR) 500 MG 24 hr tablet, TAKE 4 TABLETS DAILY WITH BREAKFAST, Disp: 360 tablet, Rfl: 3   OZEMPIC, 2 MG/DOSE, 8 MG/3ML SOPN, INJECT 2MG  SUBCUTANEOUSLY  ONCE WEEKLY (EVERY 7 DAYS), Disp: 9 mL, Rfl: 3   SODIUM CHLORIDE PO, Take 4 g by mouth., Disp: , Rfl:   EXAM: BP Readings from Last 3 Encounters:  03/05/23 108/65  02/20/23 113/64  02/05/23 94/70    VITALS per patient if applicable:  GENERAL: alert, oriented, appears well and in no acute distress  HEENT: atraumatic, conjunttiva clear, no obvious abnormalities on inspection of external nose and ears  LUNGS: on inspection no signs of respiratory distress, breathing rate appears normal, no obvious gross SOB, gasping or wheezing  CV: no obvious cyanosis  PSYCH/NEURO: pleasant and cooperative, no obvious depression or anxiety, speech and thought processing grossly intact Lab Results  Component Value Date   WBC 9.5 02/05/2023   HGB 17.4 (H) 02/05/2023   HCT 51.6 02/05/2023   PLT 257.0 02/05/2023   GLUCOSE 154 (H) 02/05/2023   CHOL 160 02/05/2023   TRIG 210.0 (H) 02/05/2023   HDL 39.40 02/05/2023   LDLDIRECT 125.0 12/26/2018   LDLCALC 78 02/05/2023   ALT 26 02/05/2023   AST 19 02/05/2023   NA 140 02/05/2023   K 4.9  02/05/2023   CL 103 02/05/2023   CREATININE 0.79 02/05/2023   BUN 17 02/05/2023   CO2 28 02/05/2023   TSH 1.13 02/05/2023   PSA 0.39 02/05/2023   HGBA1C 6.8 (A) 12/21/2022   MICROALBUR 0.9 02/05/2023    ASSESSMENT AND PLAN:  Discussed the following assessment and plan:    ICD-10-CM   1. Dysautonomia ([name])  G90.1     2. Orthostatic dizzinesshypotension  R42     3. Frequent falls  R29.6     4. Retinopathy  H35.00      Ongoing  hypotension episodes   under rx and change in florinef dose   Retinal injections.monthly Still curious if the falls with "legs giving out are from the dysautonomia or another cause.he describes as legs giving out  and unpredictable.  Will update fmal form   and get sent  Agree with seeing cards for low bp  as planned ( [place])  Counseled.  Agree with on going eval. Plan as per specilaists if needs further documentation of health limitations and prognosis.   Expectant management and discussion of plan and treatment with opportunity to ask questions and all were answered. The patient agreed with the plan and demonstrated an understanding of the instructions.   Advised to call back or seek an in-person evaluation if worsening  or having  further concerns  in interim. Return for when planned.    Berniece Andreas, MD

## 2023-03-04 ENCOUNTER — Other Ambulatory Visit: Payer: Self-pay | Admitting: Internal Medicine

## 2023-03-05 ENCOUNTER — Telehealth (INDEPENDENT_AMBULATORY_CARE_PROVIDER_SITE_OTHER): Payer: BC Managed Care – PPO | Admitting: Internal Medicine

## 2023-03-05 ENCOUNTER — Encounter: Payer: Self-pay | Admitting: Internal Medicine

## 2023-03-05 VITALS — BP 108/65 | HR 76 | Ht 70.0 in | Wt 286.0 lb

## 2023-03-05 DIAGNOSIS — G901 Familial dysautonomia [Riley-Day]: Secondary | ICD-10-CM | POA: Diagnosis not present

## 2023-03-05 DIAGNOSIS — H3500 Unspecified background retinopathy: Secondary | ICD-10-CM | POA: Diagnosis not present

## 2023-03-05 DIAGNOSIS — R42 Dizziness and giddiness: Secondary | ICD-10-CM | POA: Diagnosis not present

## 2023-03-05 DIAGNOSIS — R296 Repeated falls: Secondary | ICD-10-CM

## 2023-03-14 ENCOUNTER — Other Ambulatory Visit (INDEPENDENT_AMBULATORY_CARE_PROVIDER_SITE_OTHER): Payer: Self-pay | Admitting: Family Medicine

## 2023-03-14 DIAGNOSIS — E782 Mixed hyperlipidemia: Secondary | ICD-10-CM

## 2023-03-15 ENCOUNTER — Other Ambulatory Visit: Payer: Self-pay | Admitting: Internal Medicine

## 2023-03-15 NOTE — Telephone Encounter (Signed)
  [employer]  refill request complete

## 2023-03-28 DIAGNOSIS — H5319 Other subjective visual disturbances: Secondary | ICD-10-CM | POA: Diagnosis not present

## 2023-04-04 DIAGNOSIS — E113313 Type 2 diabetes mellitus with moderate nonproliferative diabetic retinopathy with macular edema, bilateral: Secondary | ICD-10-CM | POA: Diagnosis not present

## 2023-04-11 ENCOUNTER — Other Ambulatory Visit (INDEPENDENT_AMBULATORY_CARE_PROVIDER_SITE_OTHER): Payer: Self-pay | Admitting: Family Medicine

## 2023-04-11 DIAGNOSIS — F3289 Other specified depressive episodes: Secondary | ICD-10-CM

## 2023-04-17 ENCOUNTER — Ambulatory Visit (INDEPENDENT_AMBULATORY_CARE_PROVIDER_SITE_OTHER): Payer: BC Managed Care – PPO | Admitting: Family Medicine

## 2023-04-23 ENCOUNTER — Ambulatory Visit: Payer: BC Managed Care – PPO | Admitting: Internal Medicine

## 2023-05-13 ENCOUNTER — Ambulatory Visit: Payer: BC Managed Care – PPO | Admitting: Cardiology

## 2023-05-13 ENCOUNTER — Encounter: Payer: Self-pay | Admitting: Internal Medicine

## 2023-05-13 DIAGNOSIS — G901 Familial dysautonomia [Riley-Day]: Secondary | ICD-10-CM | POA: Diagnosis not present

## 2023-05-13 DIAGNOSIS — U071 COVID-19: Secondary | ICD-10-CM | POA: Diagnosis not present

## 2023-05-13 NOTE — Telephone Encounter (Signed)
 Did not see telehealth visit on pt's chart.   Attempt to ask pt. Left a voicemail to call us back.

## 2023-05-14 ENCOUNTER — Encounter (INDEPENDENT_AMBULATORY_CARE_PROVIDER_SITE_OTHER): Payer: Self-pay | Admitting: Family Medicine

## 2023-05-14 ENCOUNTER — Telehealth (INDEPENDENT_AMBULATORY_CARE_PROVIDER_SITE_OTHER): Payer: Self-pay | Admitting: Family Medicine

## 2023-05-14 DIAGNOSIS — F3289 Other specified depressive episodes: Secondary | ICD-10-CM

## 2023-05-14 DIAGNOSIS — U071 COVID-19: Secondary | ICD-10-CM

## 2023-05-14 DIAGNOSIS — Z7985 Long-term (current) use of injectable non-insulin antidiabetic drugs: Secondary | ICD-10-CM

## 2023-05-14 DIAGNOSIS — J45909 Unspecified asthma, uncomplicated: Secondary | ICD-10-CM

## 2023-05-14 DIAGNOSIS — E669 Obesity, unspecified: Secondary | ICD-10-CM

## 2023-05-14 DIAGNOSIS — F5089 Other specified eating disorder: Secondary | ICD-10-CM

## 2023-05-14 DIAGNOSIS — Z6841 Body Mass Index (BMI) 40.0 and over, adult: Secondary | ICD-10-CM

## 2023-05-14 DIAGNOSIS — E119 Type 2 diabetes mellitus without complications: Secondary | ICD-10-CM

## 2023-05-14 DIAGNOSIS — J4520 Mild intermittent asthma, uncomplicated: Secondary | ICD-10-CM

## 2023-05-14 DIAGNOSIS — Z7984 Long term (current) use of oral hypoglycemic drugs: Secondary | ICD-10-CM

## 2023-05-14 DIAGNOSIS — Z794 Long term (current) use of insulin: Secondary | ICD-10-CM

## 2023-05-14 MED ORDER — ALBUTEROL SULFATE HFA 108 (90 BASE) MCG/ACT IN AERS: 2.0000 | INHALATION_SPRAY | Freq: Four times a day (QID) | RESPIRATORY_TRACT | 2 refills | Status: AC | PRN

## 2023-05-14 MED ORDER — BUPROPION HCL ER (SR) 150 MG PO TB12
150.0000 mg | ORAL_TABLET | Freq: Every day | ORAL | 0 refills | Status: DC
Start: 2023-05-14 — End: 2023-07-11

## 2023-05-14 NOTE — Progress Notes (Signed)
 Office: [phone]  /  Fax: [phone]  WEIGHT SUMMARY AND BIOMETRICS  No data recorded No data recorded No data recorded  Chief Complaint: OBESITY  Virtual Visit via Telephone Note  I connected with Troy [name] on 05/14/23 at  8:20 AM EST by telephone and verified that I am speaking with the correct person using two identifiers.  Location: Patient: home Provider: clinic   I discussed the limitations, risks, security and privacy concerns of performing an evaluation and management service by telephone and the availability of in person appointments. I also discussed with the patient that there may be a patient responsible charge related to this service. The patient expressed understanding and agreed to proceed.     DHistory of Present Illness   Troy [name] "Kathlene November" is a 52 year old male with obesity and type 2 diabetes who presents to discuss his obesity treatment plan and monitor his progress.  He has gained two pounds in the last month since his last visit. He adheres to the category three plan 90% of the time and is taking Wellbutrin to help with emotional eating behaviors.  He is currently experiencing symptoms of COVID-19, including a high fever, weakness, sore throat, coughing, and a burning nose. He stays up all night due to coughing and has been taking DayQuil and NyQuil for symptom relief. It has been four days since the onset of symptoms.  He has a history of type 2 diabetes and checked his blood sugar this morning, which was 124 mg/dL fasting. Before contracting COVID-19, he was walking for about 15 minutes four times a week.  He has a history of asthma, which is exacerbated by illness. He uses an albuterol rescue inhaler during these episodes.  He also has a history of cardiovascular issues, with recent medication adjustments leading to reduced falls but ongoing dizziness and weakness. His blood pressure readings are mostly in the low 100s, occasionally  dropping below 90.          PHYSICAL EXAM:  There were no vitals taken for this visit. There is no height or weight on file to calculate BMI.  DIAGNOSTIC DATA REVIEWED:  BMET    Component Value Date/Time   NA 140 02/05/2023 1030   NA 142 10/03/2022 0750   K 4.9 02/05/2023 1030   CL 103 02/05/2023 1030   CO2 28 02/05/2023 1030   GLUCOSE 154 (H) 02/05/2023 1030   BUN 17 02/05/2023 1030   BUN 13 10/03/2022 0750   CREATININE 0.79 02/05/2023 1030   CREATININE 0.79 12/26/2018 1052   CALCIUM 9.7 02/05/2023 1030   GFRNONAA >60 10/31/2020 1506   GFRNONAA 106 12/26/2018 1052   GFRAA 125 12/21/2019 1433   GFRAA 123 12/26/2018 1052   Lab Results  Component Value Date   HGBA1C 6.8 (A) 12/21/2022   HGBA1C 7.3 (H) 05/19/2007   Lab Results  Component Value Date   INSULIN 7.9 09/06/2020   Lab Results  Component Value Date   TSH 1.13 02/05/2023   CBC    Component Value Date/Time   WBC 9.5 02/05/2023 1030   RBC 5.22 02/05/2023 1030   HGB 17.4 (H) 02/05/2023 1030   HCT 51.6 02/05/2023 1030   PLT 257.0 02/05/2023 1030   MCV 98.8 02/05/2023 1030   MCH 32.7 10/31/2020 1506   MCHC 33.7 02/05/2023 1030   RDW 13.2 02/05/2023 1030   Iron Studies    Component Value Date/Time   IRON 144 02/08/2020 1102   TIBC 358 02/08/2020  1102   FERRITIN 96 02/08/2020 1102   IRONPCTSAT 40 02/08/2020 1102   Lipid Panel     Component Value Date/Time   CHOL 160 02/05/2023 1030   CHOL 208 (H) 10/03/2022 0750   TRIG 210.0 (H) 02/05/2023 1030   HDL 39.40 02/05/2023 1030   HDL 34 (L) 10/03/2022 0750   CHOLHDL 4 02/05/2023 1030   VLDL 42.0 (H) 02/05/2023 1030   LDLCALC 78 02/05/2023 1030   LDLCALC 134 (H) 10/03/2022 0750   LDLCALC 74 02/08/2020 1102   LDLDIRECT 125.0 12/26/2018 1052   Hepatic Function Panel     Component Value Date/Time   PROT 7.4 02/05/2023 1030   PROT 6.8 10/03/2022 0750   ALBUMIN 4.6 02/05/2023 1030   ALBUMIN 4.3 10/03/2022 0750   AST 19 02/05/2023 1030   ALT  26 02/05/2023 1030   ALKPHOS 70 02/05/2023 1030   BILITOT 0.8 02/05/2023 1030   BILITOT 0.4 10/03/2022 0750   BILIDIR 0.2 01/31/2022 1042      Component Value Date/Time   TSH 1.13 02/05/2023 1030   Nutritional Lab Results  Component Value Date   VD25OH 32.8 10/03/2022   VD25OH 70.2 03/23/2021   VD25OH 55.3 09/06/2020     Assessment and Plan    COVID-19 Acute COVID-19 infection with symptoms of fever, weakness, sore throat, cough, and nasal burning, persisting for four days. Persistent cough present. Paxlovid was recommended by urgent care but is likely too late for initiation as it is most effective within five days of symptom onset. Monitoring for potential secondary pneumonia is advised. - Rest and maintain adequate hydration - Seek medical attention if symptoms worsen or if secondary pneumonia is suspected - Schedule a follow-up appointment next month  Asthma Asthma exacerbation triggered by COVID-19. Albuterol inhaler prescribed as a rescue medication. - Refill albuterol inhaler prescription - Use albuterol inhaler as needed for asthma symptoms  Type 2 Diabetes Mellitus Type 2 diabetes with recent fasting glucose of 124 mg/dL. Current focus is on COVID-19 recovery rather than strict diabetes management. - Monitor blood glucose levels as needed - Resume diabetes management plan post-COVID-19 recovery  Obesity Engaged in category three obesity management plan 90% of the time. Reports a two-pound weight gain in the last month. Previously walked 15 minutes four times a week before illness. Current focus is on COVID-19 recovery with emphasis on protein intake to maintain muscle mass. - Continue category three obesity management plan post-COVID-19 recovery - Focus on protein intake to maintain muscle mass during recovery  Emotional eating behaviors On bupropion for emotional eating behaviors. Ran out of medication a week ago due to refill issue. Advised to use MyChart for  future refill requests to ensure timely refills. - Refill bupropion prescription - Use MyChart for future medication refill requests      He was informed of the importance of frequent follow up visits to maximize his success with intensive lifestyle modifications for his multiple health conditions.    Quillian Quince, MD

## 2023-05-15 DIAGNOSIS — J180 Bronchopneumonia, unspecified organism: Secondary | ICD-10-CM | POA: Diagnosis not present

## 2023-05-27 DIAGNOSIS — G4733 Obstructive sleep apnea (adult) (pediatric): Secondary | ICD-10-CM | POA: Diagnosis not present

## 2023-05-28 ENCOUNTER — Ambulatory Visit: Payer: BC Managed Care – PPO | Admitting: Cardiology

## 2023-05-30 DIAGNOSIS — E113312 Type 2 diabetes mellitus with moderate nonproliferative diabetic retinopathy with macular edema, left eye: Secondary | ICD-10-CM | POA: Diagnosis not present

## 2023-05-30 DIAGNOSIS — H5319 Other subjective visual disturbances: Secondary | ICD-10-CM | POA: Diagnosis not present

## 2023-05-30 DIAGNOSIS — E113211 Type 2 diabetes mellitus with mild nonproliferative diabetic retinopathy with macular edema, right eye: Secondary | ICD-10-CM | POA: Diagnosis not present

## 2023-05-31 ENCOUNTER — Ambulatory Visit: Payer: BC Managed Care – PPO | Admitting: Internal Medicine

## 2023-06-05 DIAGNOSIS — E113313 Type 2 diabetes mellitus with moderate nonproliferative diabetic retinopathy with macular edema, bilateral: Secondary | ICD-10-CM | POA: Diagnosis not present

## 2023-06-16 ENCOUNTER — Encounter: Payer: Self-pay | Admitting: Internal Medicine

## 2023-06-16 DIAGNOSIS — E11319 Type 2 diabetes mellitus with unspecified diabetic retinopathy without macular edema: Secondary | ICD-10-CM

## 2023-06-17 MED ORDER — METFORMIN HCL ER 500 MG PO TB24: ORAL_TABLET | ORAL | 0 refills | Status: DC

## 2023-07-01 NOTE — Progress Notes (Deleted)
 No chief complaint on file.   HPI: Troy [name] 53 y.o. come in for Chronic disease management  Rx  as atypical pna  when had pos covid 19 resp infection ROS: See pertinent positives and negatives per HPI.  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   Diabetes insipidus ([name])    Diabetes mellitus without complication ([name])    TYPE II   DVT (deep venous thrombosis) ([name])    right leg last time 2020   Dysautonomia ([name]) 07/16/2022   Leg edema, right    Male infertility    Neck injury 12/22/2013   Neuropathy    Obesity    Obesity    OSA (obstructive sleep apnea)    CPAP   Osteoarthritis of spine with radiculopathy, 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   POTS (postural orthostatic tachycardia syndrome) 04/05/2021   Referred otalgia of right ear 07/13/2021   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    Family History  Problem Relation Age of Onset   Heart disease Mother    Other Mother        clotting disorders   Diabetes Mother    High blood pressure Mother    Kidney disease Mother    Depression Mother    Sleep apnea Mother    Obesity Mother    Other Father        sudden death/cervical and lumbar disk disease   Aneurysm Father        In his 66s smoked   Hyperlipidemia Father    High blood pressure Father    Sudden death Father    Alcoholism Father    Hypertension Other    Allergies Other    Deep vein thrombosis Other    Sleep apnea Other    Obesity Other     Social History   Socioeconomic History   Marital status: Married    Spouse name: Thersia Flax   Number of children: 1    Years of education: Not on file   Highest education level: Bachelor's degree (e.g., BA, AB, BS)  Occupational History   Occupation: Customer Service Lead  Tobacco Use   Smoking status: Never   Smokeless tobacco: Never  Vaping Use   Vaping status: Never Used  Substance and Sexual Activity   Alcohol use: Yes    Comment: 1 - 2 a year   Drug use: No   Sexual activity: Yes  Other Topics Concern   Not on file  Social History Narrative   Occupation:  days [employer]   Worked 11- 8 am  Now changed job and shift to [employer] through Thursday    Working  Ft 40 hours mostly   Married 10 10 10    Wife s/p bariatric surgery pt   Regular exercise- yes   Pt does have children   Father died suddenly 05/04/08   Mom passes 2017  Acute rep disease after acute renal failure   Daily caffeine use one a day   2 dogs and cat.  Right Handed             Social Drivers of Health   Financial Resource Strain: Low Risk  (07/01/2023)   Overall Financial Resource Strain (CARDIA)    Difficulty of Paying Living Expenses: Not hard at all  Food Insecurity: No Food Insecurity (07/01/2023)   Hunger Vital Sign    Worried About Running Out of Food in the Last Year: Never true    Ran Out of Food in the Last Year: Never true  Transportation Needs: No Transportation Needs (07/01/2023)   PRAPARE - Administrator, Civil Service (Medical): No    Lack of Transportation (Non-Medical): No  Physical Activity: Insufficiently Active (07/01/2023)   Exercise Vital Sign    Days of Exercise per Week: 2 days    Minutes of Exercise per Session: 10 min  Stress: No Stress Concern Present (07/01/2023)   [employer] of Occupational Health - Occupational Stress Questionnaire    Feeling of Stress : Only a little  Social Connections: Moderately Isolated (07/01/2023)   Social Connection and Isolation Panel [NHANES]    Frequency of Communication with Friends and Family: Twice a week    Frequency of Social  Gatherings with Friends and Family: Once a week    Attends Religious Services: Never    Database administrator or Organizations: No    Attends Banker Meetings: Never    Marital Status: Married    Outpatient Medications Prior to Visit  Medication Sig Dispense Refill   albuterol  (VENTOLIN  HFA) 108 (90 Base) MCG/ACT inhaler Inhale 2 puffs into the lungs every 6 (six) hours as needed. For wheezing 1 each 2   atorvastatin  (LIPITOR) 80 MG tablet Take 1 tablet (80 mg total) by mouth daily. 90 tablet 0   buPROPion  (WELLBUTRIN  SR) 150 MG 12 hr tablet Take 1 tablet (150 mg total) by mouth daily. 90 tablet 0   cholecalciferol  (VITAMIN D3) 25 MCG (1000 UNIT) tablet Take 1,000 Units by mouth daily.     Continuous Glucose Sensor (DEXCOM G7 SENSOR) MISC APPLY 1 SENSOR EVERY 10 DAYS 9 each 4   ELIQUIS  5 MG TABS tablet TAKE 1 TABLET TWICE A DAY 180 tablet 2   empagliflozin  (JARDIANCE ) 25 MG TABS tablet TAKE 1 TABLET BY MOUTH EVERY DAY BEFORE BREAKFAST 90 tablet 3   escitalopram  (LEXAPRO ) 20 MG tablet Take 1 tablet (20 mg total) by mouth daily. 90 tablet 0   fludrocortisone (FLORINEF) 0.1 MG tablet Take 0.1 mg by mouth 2 (two) times daily.     insulin  aspart (NOVOLOG ) 100 UNIT/ML injection Use 80 units a day in the insulin  pump 90 mL 3   Insulin  Disposable Pump (V-GO 30) 30 UNIT/24HR KIT USE AND DISCARD 1 V-GO     DEVICE DAILY 90 kit 3   metFORMIN  (GLUCOPHAGE -XR) 500 MG 24 hr tablet TAKE 4 TABLETS DAILY WITH BREAKFAST 120 tablet 0   OZEMPIC , 2 MG/DOSE, 8 MG/3ML SOPN INJECT 2MG  SUBCUTANEOUSLY  ONCE WEEKLY (EVERY 7 DAYS) 9 mL 3   SODIUM CHLORIDE  PO Take 4 g by mouth.     No facility-administered medications prior to visit.     EXAM:  There were no vitals taken for this visit.  There is no height or weight on file to calculate BMI.  GENERAL: vitals reviewed and listed above, alert, oriented, appears well hydrated and in no acute distress HEENT: atraumatic, conjunctiva  clear, no obvious  abnormalities on inspection of external nose and ears OP :  no lesion edema or exudate  NECK: no obvious masses on inspection palpation  LUNGS: clear to auscultation bilaterally, no wheezes, rales or rhonchi, good air movement CV: HRRR, no clubbing cyanosis or  peripheral edema nl cap refill  MS: moves all extremities without noticeable focal  abnormality PSYCH: pleasant and cooperative, no obvious depression or anxiety Lab Results  Component Value Date   WBC 9.5 02/05/2023   HGB 17.4 (H) 02/05/2023   HCT 51.6 02/05/2023   PLT 257.0 02/05/2023   GLUCOSE 154 (H) 02/05/2023   CHOL 160 02/05/2023   TRIG 210.0 (H) 02/05/2023   HDL 39.40 02/05/2023   LDLDIRECT 125.0 12/26/2018   LDLCALC 78 02/05/2023   ALT 26 02/05/2023   AST 19 02/05/2023   NA 140 02/05/2023   K 4.9 02/05/2023   CL 103 02/05/2023   CREATININE 0.79 02/05/2023   BUN 17 02/05/2023   CO2 28 02/05/2023   TSH 1.13 02/05/2023   PSA 0.39 02/05/2023   HGBA1C 6.8 (A) 12/21/2022   MICROALBUR 0.9 02/05/2023   BP Readings from Last 3 Encounters:  03/05/23 108/65  02/20/23 113/64  02/05/23 94/70    ASSESSMENT AND PLAN:  Discussed the following assessment and plan:  No diagnosis found.  -Patient advised to return or notify health care team  if  new concerns arise.  There are no Patient Instructions on file for this visit.   Mackayla Mullins K. Adanna Zuckerman M.D.

## 2023-07-02 ENCOUNTER — Ambulatory Visit: Payer: Self-pay | Admitting: Internal Medicine

## 2023-07-02 ENCOUNTER — Ambulatory Visit (INDEPENDENT_AMBULATORY_CARE_PROVIDER_SITE_OTHER): Payer: Self-pay

## 2023-07-02 DIAGNOSIS — Z23 Encounter for immunization: Secondary | ICD-10-CM

## 2023-07-10 ENCOUNTER — Encounter: Payer: Self-pay | Admitting: Internal Medicine

## 2023-07-10 ENCOUNTER — Ambulatory Visit (INDEPENDENT_AMBULATORY_CARE_PROVIDER_SITE_OTHER): Admitting: Internal Medicine

## 2023-07-10 VITALS — BP 120/60 | HR 93 | Ht 70.0 in | Wt 298.2 lb

## 2023-07-10 DIAGNOSIS — Z794 Long term (current) use of insulin: Secondary | ICD-10-CM

## 2023-07-10 DIAGNOSIS — E785 Hyperlipidemia, unspecified: Secondary | ICD-10-CM | POA: Diagnosis not present

## 2023-07-10 DIAGNOSIS — E66813 Obesity, class 3: Secondary | ICD-10-CM

## 2023-07-10 DIAGNOSIS — E11319 Type 2 diabetes mellitus with unspecified diabetic retinopathy without macular edema: Secondary | ICD-10-CM

## 2023-07-10 DIAGNOSIS — Z6841 Body Mass Index (BMI) 40.0 and over, adult: Secondary | ICD-10-CM

## 2023-07-10 LAB — POCT GLYCOSYLATED HEMOGLOBIN (HGB A1C): Hemoglobin A1C: 7.5 % — AB (ref 4.0–5.6)

## 2023-07-10 MED ORDER — DEXCOM G7 SENSOR MISC
4 refills | Status: AC
Start: 2023-07-10 — End: ?

## 2023-07-10 MED ORDER — INSULIN ASPART 100 UNIT/ML IJ SOLN: INTRAMUSCULAR | 3 refills | Status: DC

## 2023-07-10 MED ORDER — METFORMIN HCL ER 500 MG PO TB24: ORAL_TABLET | ORAL | 3 refills | Status: AC

## 2023-07-10 MED ORDER — TIRZEPATIDE 12.5 MG/0.5ML ~~LOC~~ SOAJ: 12.5000 mg | SUBCUTANEOUS | 3 refills | Status: AC

## 2023-07-10 NOTE — Progress Notes (Signed)
 Today number Subjective:     Patient ID: Troy [name], male   DOB: 1970/08/31, 53 y.o.   MRN: [id number]  HPI Troy [name] is a pleasant 53 y.o. man, returning for management of DM2, dx 2012, insulin -dependent, with complications (DR), uncontrolled.  Last visit 6 months ago.  Interim history: He continues to work with the weight management clinic.  He gained 5 more pounds since last visit No increased urination, blurry vision (has floaters), nausea, chest pain. He still has falls. Since last visit, he had COVID19, then bronchopneumonia last month. He was out of Metformin  (was taking expired Metformin  for few mo), then out of Ozempic  for last 1.5 mo - now on Mounjaro , which she still had at home from last year.  Reviewed HbA1c levels: Lab Results  Component Value Date   HGBA1C 6.8 (A) 12/21/2022   HGBA1C 7.5 (H) 10/03/2022   HGBA1C 7.7 (A) 09/05/2022   He is on: - Metformin  XR 2000 mg in a.m. - Invokana  100 mg >> Jardiance  25 mg before breakfast - VGo 40 >> 30 with 4-6 clicks per meal, but 8 clicks before a larger meal - Ozempic  2 mg weekly >> off >> now Mounjaro  12.5 mg weekly He was on Mounjaro  12.5 mg weekly but not available or also not working as well as Ozempic  for him. He was on Victoza  before >> no SEs.   He checks his sugars more than 4 times a day with his CGM:  Previously:  Previously:   Lowest: 60 >> 45 >> 50s x1 >> 55 x1. Highest: 300s >> 300 >> 200s>> 400.  -No CKD: Lab Results  Component Value Date   BUN 17 02/05/2023   BUN 13 10/03/2022   CREATININE 0.79 02/05/2023   CREATININE 0.78 10/03/2022   No MAU: Lab Results  Component Value Date   MICRALBCREAT 1.0 02/05/2023   MICRALBCREAT <4 10/03/2022   MICRALBCREAT 1.0 01/31/2022   MICRALBCREAT 6 02/08/2020   MICRALBCREAT 0.5 12/26/2018   MICRALBCREAT 1.4 07/22/2017   MICRALBCREAT 2.2 12/18/2016   MICRALBCREAT 5.8 10/18/2015   MICRALBCREAT 2.0 03/16/2013   MICRALBCREAT 1.6 11/06/2012  He is not on  ACE inhibitor/ARB.  -+ HL: Lab Results  Component Value Date   CHOL 160 02/05/2023   HDL 39.40 02/05/2023   LDLCALC 78 02/05/2023   LDLDIRECT 125.0 12/26/2018   TRIG 210.0 (H) 02/05/2023   CHOLHDL 4 02/05/2023  On Lipitor 20 >> 40 >> 80 (increased 12/30/2022).  - Last eye exam was 11/22/2022: + DR.  He sees a retina specialist.  He started intraocular injections - for macular edema OU.  - No numbness and tingling in feet - sees podiatry.  Previously on gabapentin , then on Lyrica , then off.  Last foot exam 08/30/2022 by Dr. Dorice Gardner. He has an appt 08/09/2023.  He has OSA and is compliant with his CPAP. He had a DVT in R leg in 04/2015.  In 2020: He had another DVT episode (his 3rd: 2003, 2017, 2020) >> on Eliquis .  He noticed that sugars were higher after he started Eliquis . He lost 45 lbs before 2017 - mostly vegan food.  He has a history of ADD. He did see urology before and was given Viagra, but causes for ED were not explored. We checked his testosterone  level.  This returned mildly low: Component     Latest Ref Rng & Units 04/27/2019  Testosterone , Serum (Total)     ng/dL 621 (L)  % Free Testosterone      %  1.9  Free Testosterone , S     pg/mL 50 (L)  Sex Hormone Binding Globulin     nmol/L 34.5  After the above results, I referred him to the weight management clinic. He was started on Citalopram for presumed dx. Of andropause - feels much better - his irritability resolved.  After an episode of syncope, we checked him for adrenal insufficiency -normal cosyntropin  stimulation test: Component     Latest Ref Rng & Units 11/24/2020 11/24/2020 11/24/2020         2:08 PM  2:39 PM  3:05 PM  Cortisol, Plasma     ug/dL 9.1 16.1 09.6   He had epidural steroid injections (02/2020 and 03/2020) and then cervical decompression surgery (diskectomy, fusion) on 05/25/2020. He experienced vertigo in 09/2020, before a diagnosis of COVID-19.  He then had a syncopal episode (CBG 160).  He had  milder episodes afterwards - but not recently.  No orthostasis.  We checked him for adrenal insufficiency and investigation was negative after last visit.  He developed leg weakness and had several falls.  He had extensive investigation >> negative. He was referred to cardiology in [place] - now has a diagnosis of dysautonomia (not POTS). He is on a high salt diet, medication, and compression garment.   Review of Systems: + See HPI   I reviewed pt's medications, allergies, PMH, social hx, family hx, and changes were documented in the history of present illness. Otherwise, unchanged from my initial visit note.  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   Diabetes insipidus ([name])    Diabetes mellitus without complication ([name])    TYPE II   DVT (deep venous thrombosis) ([name])    right leg last time 2020   Dysautonomia ([name]) 07/16/2022   Leg edema, right    Male infertility    Neck injury 12/22/2013   Neuropathy    Obesity    Obesity    OSA (obstructive sleep apnea)    CPAP   Osteoarthritis of spine with radiculopathy, 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   POTS (postural orthostatic tachycardia syndrome) 04/05/2021   Referred otalgia of right ear 07/13/2021   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   Past Surgical History:  Procedure Laterality Date   ANTERIOR CERVICAL DECOMP/DISCECTOMY FUSION N/A 05/25/2020   Procedure: ANTERIOR CERVICAL DECOMPRESSION/DISCECTOMY FUSION, INTERBODY PROSTHESIS, PLATE/SCREWS CERVICAL FIVE- CERVICAL SIX;  Surgeon: Garry Kansas, MD;  Location: [facility] OR;  Service: Neurosurgery;   Laterality: N/A;   BACK SURGERY     CERVICAL DISC SURGERY     C6/C7 2009   cubital tunnel left arm     2003   ELBOW SURGERY     FIBULA FRACTURE SURGERY     plate & pin removed due to infection 1997   ULNAR NERVE REPAIR     Social History   Socioeconomic History   Marital status: Married    Spouse name: Thersia Flax   Number of children: 1   Years of education: Not on file   Highest education level: Bachelor's  degree (e.g., BA, AB, BS)  Occupational History   Occupation: Customer Service Lead  Tobacco Use   Smoking status: Never   Smokeless tobacco: Never  Vaping Use   Vaping status: Never Used  Substance and Sexual Activity   Alcohol use: Yes    Comment: 1 - 2 a year   Drug use: No   Sexual activity: Yes  Other Topics Concern   Not on file  Social History Narrative   Occupation:  days [employer] 11- 8 am  Now changed job and shift to [employer] through Thursday    Working  Ft 40 hours mostly   Married 10 10 10    Wife s/p bariatric surgery pt   Regular exercise- yes   Pt does have children   Father died suddenly 05/01/2008   Mom passes 2017  Acute rep disease after acute renal failure   Daily caffeine use one a day   2 dogs and cat.       Right Handed             Social Drivers of Health   Financial Resource Strain: Low Risk  (07/01/2023)   Overall Financial Resource Strain (CARDIA)    Difficulty of Paying Living Expenses: Not hard at all  Food Insecurity: No Food Insecurity (07/01/2023)   Hunger Vital Sign    Worried About Running Out of Food in the Last Year: Never true    Ran Out of Food in the Last Year: Never true  Transportation Needs: No Transportation Needs (07/01/2023)   PRAPARE - Administrator, Civil Service (Medical): No    Lack of Transportation (Non-Medical): No  Physical Activity: Insufficiently Active (07/01/2023)   Exercise Vital Sign    Days of Exercise per Week: 2 days    Minutes of Exercise per Session: 10 min  Stress: No  Stress Concern Present (07/01/2023)   [employer] of Occupational Health - Occupational Stress Questionnaire    Feeling of Stress : Only a little  Social Connections: Moderately Isolated (07/01/2023)   Social Connection and Isolation Panel [NHANES]    Frequency of Communication with Friends and Family: Twice a week    Frequency of Social Gatherings with Friends and Family: Once a week    Attends Religious Services: Never    Database administrator or Organizations: No    Attends Banker Meetings: Never    Marital Status: Married  Catering manager Violence: Not At Risk (02/05/2023)   Humiliation, Afraid, Rape, and Kick questionnaire    Fear of Current or Ex-Partner: No    Emotionally Abused: No    Physically Abused: No    Sexually Abused: No   Current Outpatient Medications on File Prior to Visit  Medication Sig Dispense Refill   albuterol  (VENTOLIN  HFA) 108 (90 Base) MCG/ACT inhaler Inhale 2 puffs into the lungs every 6 (six) hours as needed. For wheezing 1 each 2   atorvastatin  (LIPITOR) 80 MG tablet Take 1 tablet (80 mg total) by mouth daily. 90 tablet 0   buPROPion  (WELLBUTRIN  SR) 150 MG 12 hr tablet Take 1 tablet (150 mg total) by mouth daily. 90 tablet 0   cholecalciferol  (VITAMIN D3) 25 MCG (1000 UNIT) tablet Take 1,000 Units by mouth daily.     Continuous Glucose Sensor (DEXCOM G7 SENSOR) MISC APPLY 1 SENSOR EVERY 10 DAYS 9 each 4   ELIQUIS  5 MG TABS tablet TAKE 1 TABLET TWICE A DAY 180 tablet 2  empagliflozin  (JARDIANCE ) 25 MG TABS tablet TAKE 1 TABLET BY MOUTH EVERY DAY BEFORE BREAKFAST 90 tablet 3   escitalopram  (LEXAPRO ) 20 MG tablet Take 1 tablet (20 mg total) by mouth daily. 90 tablet 0   fludrocortisone (FLORINEF) 0.1 MG tablet Take 0.1 mg by mouth 2 (two) times daily.     insulin  aspart (NOVOLOG ) 100 UNIT/ML injection Use 80 units a day in the insulin  pump 90 mL 3   Insulin  Disposable Pump (V-GO 30) 30 UNIT/24HR KIT USE AND DISCARD 1 V-GO     DEVICE DAILY  90 kit 3   metFORMIN  (GLUCOPHAGE -XR) 500 MG 24 hr tablet TAKE 4 TABLETS DAILY WITH BREAKFAST 120 tablet 0   OZEMPIC , 2 MG/DOSE, 8 MG/3ML SOPN INJECT 2MG  SUBCUTANEOUSLY  ONCE WEEKLY (EVERY 7 DAYS) 9 mL 3   SODIUM CHLORIDE  PO Take 4 g by mouth.     No current facility-administered medications on file prior to visit.   No Known Allergies Family History  Problem Relation Age of Onset   Heart disease Mother    Other Mother        clotting disorders   Diabetes Mother    High blood pressure Mother    Kidney disease Mother    Depression Mother    Sleep apnea Mother    Obesity Mother    Other Father        sudden death/cervical and lumbar disk disease   Aneurysm Father        In his 74s smoked   Hyperlipidemia Father    High blood pressure Father    Sudden death Father    Alcoholism Father    Hypertension Other    Allergies Other    Deep vein thrombosis Other    Sleep apnea Other    Obesity Other     Objective:   Physical Exam BP 120/60   Pulse 93   Ht 5\' 10"  (1.778 m)   Wt 298 lb 3.2 oz (135.3 kg)   SpO2 97%   BMI 42.79 kg/m   Wt Readings from Last 10 Encounters:  07/10/23 298 lb 3.2 oz (135.3 kg)  03/05/23 286 lb (129.7 kg)  02/20/23 289 lb (131.1 kg)  02/05/23 286 lb 3.2 oz (129.8 kg)  01/16/23 281 lb (127.5 kg)  12/21/22 293 lb 3.2 oz (133 kg)  12/19/22 286 lb (129.7 kg)  12/04/22 287 lb (130.2 kg)  11/21/22 290 lb (131.5 kg)  11/01/22 299 lb (135.6 kg)   Constitutional: overweight, in NAD Eyes: EOMI, no exophthalmos ENT: no thyromegaly, no cervical lymphadenopathy Cardiovascular: RRR, No MRG, + RLE swelling -chronic, after his DVT  Respiratory: CTA B Musculoskeletal: no deformities Skin: no rashes but scrapes on both knees (fell yesterday when standing in hs garage) Neurological: + tremors with outstretched hands  Assessment:     1. DM2, insulin -dependent, uncontrolled, with complications - DR  2. Obesity class 3  3. HL  Plan:     1. Patient with  history of uncontrolled diabetes, on oral antidiabetic regimen with metformin  and SGLT2 inhibitor and also weekly GLP-1 receptor agonist along with the V-Go patch pump, with improving control.  At last visit, HbA1c was 6.8%, lower.  We did not change the regimen at that time and sugars appeared to be much improved per review of his CGM tracings.  He had occasional hyperglycemic spikes after meals especially after breakfast and lunch mostly due to forgetting to do the V-Go clicks before meals. CGM interpretation: -At today's visit, we reviewed  his CGM downloads: It appears that 61% of values are in target range (goal >70%), while 39% are higher than 180 (goal <25%), and 0% are lower than 70 (goal <4%).  The calculated average blood sugar is 173.  The projected HbA1c for the next 3 months (GMI) is 7.5%. -Reviewing the CGM trends, sugars appear to be higher than before, mostly fluctuating around the upper limit of the target range for the majority of the day and slightly better at night.  He mentions that he has been using expired metformin  (from 5 years ago) without realizing and he recently.  Sugars were high, up to 300s.  He got a new prescription of metformin  and sugars started to improve.  Approximately 1.5 weeks ago he ran out of Ozempic  but he had Mounjaro  at home (12.5 mg) so he is not taking this with good tolerance.  At today's visit we discussed about continuing with an unexpired metformin  and I would prefer him to stay on Mounjaro  rather than going back on Ozempic  due to the more significant effect on sugars and weight.  I sent a new prescription to his pharmacy.  Will continue the VGo30 for now.  I refilled his insulin . - I advised him to: Patient Instructions  Please continue: - Metformin  XR 2000 mg at dinnertime - Jardiance  25 mg before b'fast  - VGo30 4-6 clicks per meal + use 8 clicks for larger meals (use this for meals that increase your blood sugar >50 mg/dl)  Try to continue: - Mounjaro   12.5 mg weekly. If this is not covered, let me know to send Ozempic .  Please return in 4 months.  - we checked his HbA1c: 7.5% (higher) - advised to check sugars at different times of the day - 4x a day, rotating check times - advised for yearly eye exams >> he is UTD - will repeat an ACR today - return to clinic in 4 months  2. Obesity class 3 -continue SGLT 2 inhibitor and switch to GLP-1/GIP receptor agonist which should also help with weight loss -He is seen in the weight management clinic.  Before last visit, he gained weight which he attributes to retaining fluid due to cardiology advising him to increase his sodium intake to help with his dysautonomia. - He lost 7 pounds before last visit, previously gained 6 - He gained 5 pounds since then.  3. HL - He was also found to have coronary artery calcifications and he sees cardiology - Lipid panel reviewed from 01/2023: LDL still above target, triglycerides high: Lab Results  Component Value Date   CHOL 160 02/05/2023   HDL 39.40 02/05/2023   LDLCALC 78 02/05/2023   LDLDIRECT 125.0 12/26/2018   TRIG 210.0 (H) 02/05/2023   CHOLHDL 4 02/05/2023  - He is on Lipitor 80 mg daily, increased 12/2022.  He tolerates this well.  Emilie Harden, MD PhD [facility] Endocrinology

## 2023-07-10 NOTE — Patient Instructions (Addendum)
 Please continue: - Metformin  XR 2000 mg at dinnertime - Jardiance  25 mg before b'fast  - VGo30 4-6 clicks per meal + use 8 clicks for larger meals (use this for meals that increase your blood sugar >50 mg/dl)  Try to continue: - Mounjaro  12.5 mg weekly. If this is not covered, let me know to send Ozempic .  Please return in 4 months.

## 2023-07-11 ENCOUNTER — Encounter (INDEPENDENT_AMBULATORY_CARE_PROVIDER_SITE_OTHER): Payer: Self-pay | Admitting: Family Medicine

## 2023-07-11 ENCOUNTER — Encounter: Payer: Self-pay | Admitting: Internal Medicine

## 2023-07-11 ENCOUNTER — Ambulatory Visit (INDEPENDENT_AMBULATORY_CARE_PROVIDER_SITE_OTHER): Admitting: Family Medicine

## 2023-07-11 ENCOUNTER — Other Ambulatory Visit (INDEPENDENT_AMBULATORY_CARE_PROVIDER_SITE_OTHER): Payer: Self-pay | Admitting: Family Medicine

## 2023-07-11 VITALS — BP 111/73 | HR 92 | Temp 98.1°F | Ht 70.0 in | Wt 290.0 lb

## 2023-07-11 DIAGNOSIS — E782 Mixed hyperlipidemia: Secondary | ICD-10-CM

## 2023-07-11 DIAGNOSIS — R296 Repeated falls: Secondary | ICD-10-CM

## 2023-07-11 DIAGNOSIS — R42 Dizziness and giddiness: Secondary | ICD-10-CM | POA: Diagnosis not present

## 2023-07-11 DIAGNOSIS — E669 Obesity, unspecified: Secondary | ICD-10-CM

## 2023-07-11 DIAGNOSIS — F3289 Other specified depressive episodes: Secondary | ICD-10-CM

## 2023-07-11 DIAGNOSIS — E119 Type 2 diabetes mellitus without complications: Secondary | ICD-10-CM | POA: Diagnosis not present

## 2023-07-11 DIAGNOSIS — I951 Orthostatic hypotension: Secondary | ICD-10-CM | POA: Diagnosis not present

## 2023-07-11 DIAGNOSIS — Z6841 Body Mass Index (BMI) 40.0 and over, adult: Secondary | ICD-10-CM

## 2023-07-11 DIAGNOSIS — F5089 Other specified eating disorder: Secondary | ICD-10-CM

## 2023-07-11 DIAGNOSIS — Z794 Long term (current) use of insulin: Secondary | ICD-10-CM

## 2023-07-11 DIAGNOSIS — E785 Hyperlipidemia, unspecified: Secondary | ICD-10-CM

## 2023-07-11 DIAGNOSIS — Z7985 Long-term (current) use of injectable non-insulin antidiabetic drugs: Secondary | ICD-10-CM

## 2023-07-11 DIAGNOSIS — Z7984 Long term (current) use of oral hypoglycemic drugs: Secondary | ICD-10-CM

## 2023-07-11 LAB — MICROALBUMIN / CREATININE URINE RATIO
Creatinine, Urine: 83 mg/dL (ref 20–320)
Microalb Creat Ratio: 7 mg/g{creat} (ref <30)
Microalb, Ur: 0.6 mg/dL

## 2023-07-11 MED ORDER — ATORVASTATIN CALCIUM 80 MG PO TABS: 80.0000 mg | ORAL_TABLET | Freq: Every day | ORAL | 0 refills | Status: DC

## 2023-07-11 MED ORDER — ESCITALOPRAM OXALATE 20 MG PO TABS: 20.0000 mg | ORAL_TABLET | Freq: Every day | ORAL | 0 refills | Status: DC

## 2023-07-11 MED ORDER — BUPROPION HCL ER (SR) 150 MG PO TB12: 150.0000 mg | ORAL_TABLET | Freq: Every day | ORAL | 0 refills | Status: DC

## 2023-07-11 NOTE — Progress Notes (Signed)
 Office: [phone]  /  Fax: [phone]  WEIGHT SUMMARY AND BIOMETRICS  Anthropometric Measurements Height: 5\' 10"  (1.778 m) Weight: 290 lb (131.5 kg) BMI (Calculated): 41.61 Weight at Last Visit: 289lb Weight Lost Since Last Visit: 0 Weight Gained Since Last Visit: 1lb Starting Weight: 293lb Total Weight Loss (lbs): 3 lb (1.361 kg)   Body Composition  Body Fat %: 39.3 % Fat Mass (lbs): 114.2 lbs Muscle Mass (lbs): 167.8 lbs Total Body Water (lbs): 126.4 lbs Visceral Fat Rating : 24   Other Clinical Data Fasting: no Labs: no Today's Visit #: 45 Starting Date: 08/18/19    Chief Complaint: OBESITY    History of Present Illness Troy [name] "Troy [name]" is a 53 year old male with obesity, emotional eating behaviors, hyperlipidemia, and type two diabetes who presents for obesity treatment plan assessment and progress evaluation.  He is adhering to a category three eating plan 90% of the time and engages in walking for about ten minutes three times a week. Despite these efforts, he has gained one pound since his last office visit five months ago. He mentions the cost of groceries as a challenge, especially since both he and his wife are on the same meal plan. He finds it more economical to incorporate vegan options like beef crumbles into his diet.  He has a history of emotional eating behaviors and is currently taking Lexapro  and Wellbutrin  to manage this condition. He also has hyperlipidemia, for which he is on Lipitor. His most recent hemoglobin A1c was elevated at 7.5, checked one day ago. His diabetes medications are monitored by another physician. He recently discovered that the metformin  he had been taking was 53 years old, which he has since replaced with a new supply two weeks ago, resulting in improved blood sugar levels.  He experienced a fall recently while gardening, resulting in injuries to his knees, chin, cheek, and possibly a bruised rib and hip. The fall  occurred after experiencing dizziness upon standing. He reports experiencing one to two falls per month, often associated with dizziness or low blood pressure. He uses a shower chair and a walker for support around the house and has a portable walker for travel. He mentions a previous incident in March where he collapsed due to COVID-19, which he was unaware of at the time.  He is currently taking fludrocortisone to help manage low blood pressure, which can worsen during the summer months. He describes episodes where he needs to sit or lean on something to prevent falling when he feels dizzy or lightheaded.      PHYSICAL EXAM:  Blood pressure 111/73, pulse 92, temperature 98.1 F (36.7 C), height 5\' 10"  (1.778 m), weight 290 lb (131.5 kg), SpO2 99%. Body mass index is 41.61 kg/m.  DIAGNOSTIC DATA REVIEWED:  BMET    Component Value Date/Time   NA 140 02/05/2023 1030   NA 142 10/03/2022 0750   K 4.9 02/05/2023 1030   CL 103 02/05/2023 1030   CO2 28 02/05/2023 1030   GLUCOSE 154 (H) 02/05/2023 1030   BUN 17 02/05/2023 1030   BUN 13 10/03/2022 0750   CREATININE 0.79 02/05/2023 1030   CREATININE 0.79 12/26/2018 1052   CALCIUM  9.7 02/05/2023 1030   GFRNONAA >60 10/31/2020 1506   GFRNONAA 106 12/26/2018 1052   GFRAA 125 12/21/2019 1433   GFRAA 123 12/26/2018 1052   Lab Results  Component Value Date   HGBA1C 7.5 (A) 07/10/2023   HGBA1C 7.3 (H) 05/19/2007  Lab Results  Component Value Date   INSULIN  7.9 09/06/2020   Lab Results  Component Value Date   TSH 1.13 02/05/2023   CBC    Component Value Date/Time   WBC 9.5 02/05/2023 1030   RBC 5.22 02/05/2023 1030   HGB 17.4 (H) 02/05/2023 1030   HCT 51.6 02/05/2023 1030   PLT 257.0 02/05/2023 1030   MCV 98.8 02/05/2023 1030   MCH 32.7 10/31/2020 1506   MCHC 33.7 02/05/2023 1030   RDW 13.2 02/05/2023 1030   Iron Studies    Component Value Date/Time   IRON 144 02/08/2020 1102   TIBC 358 02/08/2020 1102   FERRITIN 96  02/08/2020 1102   IRONPCTSAT 40 02/08/2020 1102   Lipid Panel     Component Value Date/Time   CHOL 160 02/05/2023 1030   CHOL 208 (H) 10/03/2022 0750   TRIG 210.0 (H) 02/05/2023 1030   HDL 39.40 02/05/2023 1030   HDL 34 (L) 10/03/2022 0750   CHOLHDL 4 02/05/2023 1030   VLDL 42.0 (H) 02/05/2023 1030   LDLCALC 78 02/05/2023 1030   LDLCALC 134 (H) 10/03/2022 0750   LDLCALC 74 02/08/2020 1102   LDLDIRECT 125.0 12/26/2018 1052   Hepatic Function Panel     Component Value Date/Time   PROT 7.4 02/05/2023 1030   PROT 6.8 10/03/2022 0750   ALBUMIN 4.6 02/05/2023 1030   ALBUMIN 4.3 10/03/2022 0750   AST 19 02/05/2023 1030   ALT 26 02/05/2023 1030   ALKPHOS 70 02/05/2023 1030   BILITOT 0.8 02/05/2023 1030   BILITOT 0.4 10/03/2022 0750   BILIDIR 0.2 01/31/2022 1042      Component Value Date/Time   TSH 1.13 02/05/2023 1030   Nutritional Lab Results  Component Value Date   VD25OH 32.8 10/03/2022   VD25OH 70.2 03/23/2021   VD25OH 55.3 09/06/2020     Assessment and Plan Assessment & Plan Type 2 diabetes mellitus with elevated A1c Type 2 diabetes mellitus with recent A1c of 7.5, indicating suboptimal glycemic control. Previous metformin  was expired, contributing to poor control. New metformin  started two weeks ago, with reported improvement in blood glucose levels. Mounjaro  is being restarted to further aid in glycemic control. - Restart Mounjaro  at 12.5 mg. - Continue metformin  with new prescription.  Orthostatic hypotension Orthostatic hypotension with episodes of dizziness and falls. Managed with fludrocortisone. Symptoms include vertigo and leg weakness, leading to falls. Advised caution during summer as symptoms may worsen. Uses a walker with a seat for support during episodes of dizziness. - Continue fludrocortisone. - Use walker with seat for support during episodes of dizziness.  Falls Falls related to orthostatic hypotension. Recent fall resulted in minor injuries  including bruised rib and hip. Falls occur approximately one to two times per month. Safety measures include using a shower chair and walker. - Use walker with seat for support to prevent falls.  Obesity and EEB Obesity with emotional eating behaviors. Following category three eating plan 90% of the time. Engaging in physical activity by walking 10 minutes three times a week. Recent weight gain of one pound since last visit five months ago. Visceral fat and overall fat have decreased, and muscle mass has increased since December. - Continue category three eating plan. - Encourage increased physical activity as tolerated.  Hyperlipidemia Hyperlipidemia managed with Lipitor. - Continue category three eating plan. - Encourage increased physical activity as tolerated.     He was informed of the importance of frequent follow up visits to maximize his success with  intensive lifestyle modifications for his multiple health conditions.    Jasmine Mesi, MD

## 2023-07-15 ENCOUNTER — Encounter: Payer: Self-pay | Admitting: Internal Medicine

## 2023-07-22 DIAGNOSIS — G909 Disorder of the autonomic nervous system, unspecified: Secondary | ICD-10-CM | POA: Diagnosis not present

## 2023-07-22 DIAGNOSIS — E782 Mixed hyperlipidemia: Secondary | ICD-10-CM | POA: Diagnosis not present

## 2023-07-22 DIAGNOSIS — I825Z1 Chronic embolism and thrombosis of unspecified deep veins of right distal lower extremity: Secondary | ICD-10-CM | POA: Diagnosis not present

## 2023-07-22 DIAGNOSIS — I2510 Atherosclerotic heart disease of native coronary artery without angina pectoris: Secondary | ICD-10-CM | POA: Diagnosis not present

## 2023-08-06 ENCOUNTER — Other Ambulatory Visit (INDEPENDENT_AMBULATORY_CARE_PROVIDER_SITE_OTHER): Payer: Self-pay | Admitting: Family Medicine

## 2023-08-06 ENCOUNTER — Ambulatory Visit (INDEPENDENT_AMBULATORY_CARE_PROVIDER_SITE_OTHER): Payer: BC Managed Care – PPO | Admitting: Internal Medicine

## 2023-08-06 ENCOUNTER — Ambulatory Visit: Payer: Self-pay | Admitting: Internal Medicine

## 2023-08-06 ENCOUNTER — Encounter: Payer: Self-pay | Admitting: Internal Medicine

## 2023-08-06 VITALS — BP 94/60 | HR 79 | Temp 97.8°F | Ht 70.0 in | Wt 300.4 lb

## 2023-08-06 DIAGNOSIS — Z794 Long term (current) use of insulin: Secondary | ICD-10-CM

## 2023-08-06 DIAGNOSIS — E118 Type 2 diabetes mellitus with unspecified complications: Secondary | ICD-10-CM

## 2023-08-06 DIAGNOSIS — Z6841 Body Mass Index (BMI) 40.0 and over, adult: Secondary | ICD-10-CM

## 2023-08-06 DIAGNOSIS — R251 Tremor, unspecified: Secondary | ICD-10-CM

## 2023-08-06 DIAGNOSIS — R42 Dizziness and giddiness: Secondary | ICD-10-CM

## 2023-08-06 DIAGNOSIS — E782 Mixed hyperlipidemia: Secondary | ICD-10-CM

## 2023-08-06 DIAGNOSIS — R296 Repeated falls: Secondary | ICD-10-CM

## 2023-08-06 DIAGNOSIS — M19049 Primary osteoarthritis, unspecified hand: Secondary | ICD-10-CM

## 2023-08-06 LAB — BASIC METABOLIC PANEL WITH GFR
BUN: 14 mg/dL (ref 6–23)
CO2: 25 meq/L (ref 19–32)
Calcium: 9 mg/dL (ref 8.4–10.5)
Chloride: 105 meq/L (ref 96–112)
Creatinine, Ser: 0.72 mg/dL (ref 0.40–1.50)
GFR: 104.68 mL/min (ref >60.00)
Glucose, Bld: 96 mg/dL (ref 70–99)
Potassium: 4.1 meq/L (ref 3.5–5.1)
Sodium: 139 meq/L (ref 135–145)

## 2023-08-06 LAB — CBC WITH DIFFERENTIAL/PLATELET
Basophils Absolute: 0 10*3/uL (ref 0.0–0.1)
Basophils Relative: 0.5 % (ref 0.0–3.0)
Eosinophils Absolute: 0.1 10*3/uL (ref 0.0–0.7)
Eosinophils Relative: 0.8 % (ref 0.0–5.0)
HCT: 45 % (ref 39.0–52.0)
Hemoglobin: 15 g/dL (ref 13.0–17.0)
Lymphocytes Relative: 34.5 % (ref 12.0–46.0)
Lymphs Abs: 2.6 10*3/uL (ref 0.7–4.0)
MCHC: 33.2 g/dL (ref 30.0–36.0)
MCV: 95.3 fl (ref 78.0–100.0)
Monocytes Absolute: 0.5 10*3/uL (ref 0.1–1.0)
Monocytes Relative: 6 % (ref 3.0–12.0)
Neutro Abs: 4.5 10*3/uL (ref 1.4–7.7)
Neutrophils Relative %: 58.2 % (ref 43.0–77.0)
Platelets: 223 10*3/uL (ref 150.0–400.0)
RBC: 4.72 Mil/uL (ref 4.22–5.81)
RDW: 13.6 % (ref 11.5–15.5)
WBC: 7.7 10*3/uL (ref 4.0–10.5)

## 2023-08-06 LAB — T4, FREE: Free T4: 0.79 ng/dL (ref 0.60–1.60)

## 2023-08-06 LAB — TSH: TSH: 0.91 u[IU]/mL (ref 0.35–5.50)

## 2023-08-06 LAB — VITAMIN B12: Vitamin B-12: 356 pg/mL (ref 211–911)

## 2023-08-06 LAB — MAGNESIUM: Magnesium: 1.9 mg/dL (ref 1.5–2.5)

## 2023-08-06 LAB — C-REACTIVE PROTEIN: CRP: <1 mg/dL (ref 0.5–20.0)

## 2023-08-06 NOTE — Patient Instructions (Signed)
 Good to see you today . Update metabolic to r/o  causes for left thumb tremor Suspect may be   local compression neuro sx .

## 2023-08-06 NOTE — Progress Notes (Signed)
 Results are  all in range  and don't explain the  thumb tremor.  If  persistent or progressive we could  have  neurology consult about cause

## 2023-08-06 NOTE — Progress Notes (Signed)
 Chief Complaint  Patient presents with   Medical Management of Chronic Issues    Pt is here for 6 months f/u.   Numbness    Pt c/o hands "falling asleep" and complete numbness. A lot of joint pain on hands. Developed last 2-3 wks. Pt added tremor on Left thumb.    Fall    Pt reports he had 3 falls in the last 3wks, head spinning after getting up from sitting position.     HPI: Troy [name] 53 y.o. come in for Chronic disease management  6 mos check  Wife dx with breast cancer    under consult Dm doing  ok  Dysautonomia  still sees   specialist  in [place] and on florinef   Numbness in hands   is new  when driving and  Thumb shaking. Left  uncertain why  no new neck and arm sx  .( Remote hx of cubital surgery on that  arm) Some hand pain  Fall related to  moving  but no injury  bp tends to run below 100  at times  now wearing an apple watch that resonds to fall s  ROS: See pertinent positives and negatives per HPI.  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   Diabetes insipidus ([name])    Diabetes mellitus without complication ([name])    TYPE II   DVT (deep venous thrombosis) ([name])    right leg last time 2020   Dysautonomia ([name]) 07/16/2022   Leg edema, right    Male infertility    Neck injury 12/22/2013   Neuropathy    Obesity    Obesity    OSA (obstructive sleep apnea)    CPAP   Osteoarthritis of spine with radiculopathy, 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   POTS (postural orthostatic tachycardia syndrome) 04/05/2021   Referred otalgia of right ear 07/13/2021   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    Family History  Problem Relation Age of Onset   Heart disease Mother    Other Mother        clotting disorders   Diabetes Mother    High blood pressure Mother    Kidney disease Mother    Depression Mother    Sleep apnea Mother    Obesity Mother    Other Father        sudden death/cervical and lumbar disk disease   Aneurysm Father        In his 34s smoked   Hyperlipidemia Father    High blood pressure Father    Sudden death Father    Alcoholism Father    Hypertension Other    Allergies Other    Deep vein thrombosis Other    Sleep apnea Other    Obesity Other     Social History   Socioeconomic History   Marital status: Married    Spouse name: Thersia Flax   Number of children: 1   Years of education: Not on file   Highest education level: Bachelor's degree (e.g.,  BA, AB, BS)  Occupational History   Occupation: Customer Service Lead  Tobacco Use   Smoking status: Never   Smokeless tobacco: Never  Vaping Use   Vaping status: Never Used  Substance and Sexual Activity   Alcohol use: Yes    Comment: 1 - 2 a year   Drug use: No   Sexual activity: Yes  Other Topics Concern   Not on file  Social History Narrative   Occupation:  [employer] 11- 8 am  Now changed job and shift to [employer] through Thursday    Working  Ft 40 hours mostly   Married 10 10 10    Wife s/p bariatric surgery pt   Regular exercise- yes   Pt does have children   Father died suddenly 05-May-2008   Mom passes 2017  Acute rep disease after acute renal failure   Daily caffeine use one a day   2 dogs and cat.       Right Handed             Social Drivers of Health   Financial Resource Strain: Low Risk  (07/01/2023)   Overall Financial Resource Strain (CARDIA)    Difficulty of Paying Living Expenses: Not hard at all  Food Insecurity: No Food Insecurity (07/01/2023)   Hunger Vital Sign    Worried About Running Out of Food in the Last Year: Never true    Ran Out  of Food in the Last Year: Never true  Transportation Needs: No Transportation Needs (07/01/2023)   PRAPARE - Administrator, Civil Service (Medical): No    Lack of Transportation (Non-Medical): No  Physical Activity: Insufficiently Active (07/01/2023)   Exercise Vital Sign    Days of Exercise per Week: 2 days    Minutes of Exercise per Session: 10 min  Stress: No Stress Concern Present (07/01/2023)   [employer] of Occupational Health - Occupational Stress Questionnaire    Feeling of Stress : Only a little  Social Connections: Moderately Isolated (07/01/2023)   Social Connection and Isolation Panel [NHANES]    Frequency of Communication with Friends and Family: Twice a week    Frequency of Social Gatherings with Friends and Family: Once a week    Attends Religious Services: Never    Database administrator or Organizations: No    Attends Banker Meetings: Never    Marital Status: Married    Outpatient Medications Prior to Visit  Medication Sig Dispense Refill   albuterol  (VENTOLIN  HFA) 108 (90 Base) MCG/ACT inhaler Inhale 2 puffs into the lungs every 6 (six) hours as needed. For wheezing 1 each 2   buPROPion  (WELLBUTRIN  SR) 150 MG 12 hr tablet Take 1 tablet (150 mg total) by mouth daily. 30 tablet 0   cholecalciferol  (VITAMIN D3) 25 MCG (1000 UNIT) tablet Take 1,000 Units by mouth daily.     Continuous Glucose Sensor (DEXCOM G7 SENSOR) MISC Use every 10 days 9 each 4   ELIQUIS  5 MG TABS tablet TAKE 1 TABLET TWICE A DAY 180 tablet 2   empagliflozin  (JARDIANCE ) 25 MG TABS tablet TAKE 1 TABLET BY MOUTH EVERY DAY BEFORE BREAKFAST 90 tablet 3   escitalopram  (LEXAPRO ) 20 MG tablet Take 1 tablet (20 mg total) by mouth daily. 30 tablet 0   fludrocortisone (FLORINEF) 0.1 MG tablet Take 0.1 mg by mouth 2 (two) times daily.     insulin  aspart (NOVOLOG ) 100 UNIT/ML injection Use 80 units a  day in the insulin  pump 90 mL 3   Insulin  Disposable Pump (V-GO 30) 30 UNIT/24HR  KIT USE AND DISCARD 1 V-GO     DEVICE DAILY 90 kit 3   metFORMIN  (GLUCOPHAGE -XR) 500 MG 24 hr tablet TAKE 4 TABLETS DAILY WITH BREAKFAST 360 tablet 3   SODIUM CHLORIDE  PO Take 4 g by mouth.     tirzepatide  (MOUNJARO ) 12.5 MG/0.5ML Pen Inject 12.5 mg into the skin once a week. 6 mL 3   atorvastatin  (LIPITOR) 80 MG tablet Take 1 tablet (80 mg total) by mouth daily. 30 tablet 0   No facility-administered medications prior to visit.     EXAM:  BP 94/60 (BP Location: Right Arm, Patient Position: Sitting, Cuff Size: Large)   Pulse 79   Temp 97.8 F (36.6 C) (Oral)   Ht 5\' 10"  (1.778 m)   Wt (!) 300 lb 6.4 oz (136.3 kg)   SpO2 97%   BMI 43.10 kg/m   Body mass index is 43.1 kg/m.  GENERAL: vitals reviewed and listed above, alert, oriented, appears well hydrated and in no acute distress HEENT: atraumatic, conjunctiva  clear, no obvious abnormalities on inspection of external nose and ears  NECK: no obvious masses on inspection palpation  LUNGS: clear to auscultation bilaterally, no wheezes, rales or rhonchi,  CV: HRRR, no clubbing cyanosis nl cap refill  MS: moves all extremities without noticeable rue mild asymmetry atrophy     intermittent left thumb with fine trmor  nl pulse no obv atrophy  PSYCH: pleasant and cooperative, no obvious depression or anxiety Lab Results  Component Value Date   WBC 7.7 08/06/2023   HGB 15.0 08/06/2023   HCT 45.0 08/06/2023   PLT 223.0 08/06/2023   GLUCOSE 96 08/06/2023   CHOL 160 02/05/2023   TRIG 210.0 (H) 02/05/2023   HDL 39.40 02/05/2023   LDLDIRECT 125.0 12/26/2018   LDLCALC 78 02/05/2023   ALT 26 02/05/2023   AST 19 02/05/2023   NA 139 08/06/2023   K 4.1 08/06/2023   CL 105 08/06/2023   CREATININE 0.72 08/06/2023   BUN 14 08/06/2023   CO2 25 08/06/2023   TSH 0.91 08/06/2023   PSA 0.39 02/05/2023   HGBA1C 7.5 (A) 07/10/2023   MICROALBUR 0.6 07/10/2023   BP Readings from Last 3 Encounters:  08/06/23 94/60  07/11/23 111/73   07/10/23 120/60    ASSESSMENT AND PLAN:  Discussed the following assessment and plan:  Tremor left thumb - Plan: Basic metabolic panel with GFR, Magnesium, Vitamin B12, CBC with Differential/Platelet, C-reactive protein, TSH, T4, free  Orthostatic dizzinesshypotension - Plan: Basic metabolic panel with GFR, Magnesium, Vitamin B12, CBC with Differential/Platelet, C-reactive protein, TSH, T4, free  Type 2 diabetes mellitus with complication, with long-term current use of insulin  ([name]) - Plan: Basic metabolic panel with GFR, Magnesium, Vitamin B12, CBC with Differential/Platelet, C-reactive protein, TSH, T4, free  BMI 40.0-44.9, adult ([name]) - Plan: Basic metabolic panel with GFR, Magnesium, Vitamin B12, CBC with Differential/Platelet, C-reactive protein, TSH, T4, free  Frequent falls - Plan: Basic metabolic panel with GFR, Magnesium, Vitamin B12, CBC with Differential/Platelet, C-reactive protein, TSH, T4, free  Arthritis of hand, unspecified laterality Intermittent hand numbness while driving sounds like compression phenom  but left thumb tremor shakin uncertain  cause . Hx of cubital surgery left  and althougr ahnaded uses left because of  left  arm hand condition   Update metabolic  and if  persistent or progressive consider seeing neuro or hand  specialist .  Rest of many contitions seem stable  Stress with  spouses new dx of breast cancer .  -Patient advised to return or notify health care team  if  new concerns arise.  Patient Instructions  Good to see you today . Update metabolic to r/o  causes for left thumb tremor Suspect may be   local compression neuro sx .     Rich Paprocki K. Nerea Bordenave M.D.

## 2023-08-07 DIAGNOSIS — E113313 Type 2 diabetes mellitus with moderate nonproliferative diabetic retinopathy with macular edema, bilateral: Secondary | ICD-10-CM | POA: Diagnosis not present

## 2023-08-27 DIAGNOSIS — G4733 Obstructive sleep apnea (adult) (pediatric): Secondary | ICD-10-CM | POA: Diagnosis not present

## 2023-08-29 DIAGNOSIS — R197 Diarrhea, unspecified: Secondary | ICD-10-CM | POA: Diagnosis not present

## 2023-09-03 ENCOUNTER — Encounter (INDEPENDENT_AMBULATORY_CARE_PROVIDER_SITE_OTHER): Payer: Self-pay | Admitting: Family Medicine

## 2023-09-03 ENCOUNTER — Ambulatory Visit (INDEPENDENT_AMBULATORY_CARE_PROVIDER_SITE_OTHER): Admitting: Family Medicine

## 2023-09-03 VITALS — BP 103/68 | HR 97 | Temp 97.8°F | Ht 70.0 in | Wt 285.0 lb

## 2023-09-03 DIAGNOSIS — K529 Noninfective gastroenteritis and colitis, unspecified: Secondary | ICD-10-CM

## 2023-09-03 DIAGNOSIS — R5383 Other fatigue: Secondary | ICD-10-CM | POA: Diagnosis not present

## 2023-09-03 DIAGNOSIS — E119 Type 2 diabetes mellitus without complications: Secondary | ICD-10-CM

## 2023-09-03 DIAGNOSIS — Z6841 Body Mass Index (BMI) 40.0 and over, adult: Secondary | ICD-10-CM

## 2023-09-03 DIAGNOSIS — A1801 Tuberculosis of spine: Secondary | ICD-10-CM

## 2023-09-03 DIAGNOSIS — G90A Postural orthostatic tachycardia syndrome (POTS): Secondary | ICD-10-CM

## 2023-09-03 DIAGNOSIS — E669 Obesity, unspecified: Secondary | ICD-10-CM

## 2023-09-03 DIAGNOSIS — F5089 Other specified eating disorder: Secondary | ICD-10-CM | POA: Diagnosis not present

## 2023-09-03 DIAGNOSIS — E1142 Type 2 diabetes mellitus with diabetic polyneuropathy: Secondary | ICD-10-CM | POA: Diagnosis not present

## 2023-09-03 DIAGNOSIS — F3289 Other specified depressive episodes: Secondary | ICD-10-CM

## 2023-09-03 DIAGNOSIS — G901 Familial dysautonomia [Riley-Day]: Secondary | ICD-10-CM

## 2023-09-03 DIAGNOSIS — L84 Corns and callosities: Secondary | ICD-10-CM | POA: Diagnosis not present

## 2023-09-03 DIAGNOSIS — Z794 Long term (current) use of insulin: Secondary | ICD-10-CM

## 2023-09-03 MED ORDER — ESCITALOPRAM OXALATE 20 MG PO TABS: 20.0000 mg | ORAL_TABLET | Freq: Every day | ORAL | 0 refills | Status: DC

## 2023-09-03 MED ORDER — BUPROPION HCL ER (SR) 150 MG PO TB12: 150.0000 mg | ORAL_TABLET | Freq: Every day | ORAL | 0 refills | Status: DC

## 2023-09-03 NOTE — Progress Notes (Signed)
 Office: [phone]  /  Fax: [phone]  WEIGHT SUMMARY AND BIOMETRICS  Anthropometric Measurements Height: 5' 10 (1.778 m) Weight: 285 lb (129.3 kg) BMI (Calculated): 40.89 Weight at Last Visit: 290 lb Weight Lost Since Last Visit: 5 lb Weight Gained Since Last Visit: 0 Starting Weight: 293 lb Total Weight Loss (lbs): 8 lb (3.629 kg)   Body Composition  Body Fat %: 39.5 % Fat Mass (lbs): 112.8 lbs Muscle Mass (lbs): 164.2 lbs Total Body Water (lbs): 123 lbs Visceral Fat Rating : 24   Other Clinical Data Fasting: No Labs: No Today's Visit #: 46 Starting Date: 08/18/19    Chief Complaint: OBESITY    History of Present Illness Troy [name] is a 53 year old male who presents for obesity treatment and progress assessment.  He was prescribed a category three eating plan but has only been following it about ten percent of the time. He engages in yard work for exercise, approximately thirty to sixty minutes once a week. He reports a weight loss of five pounds over the last week. It has been seven weeks since his last visit.  He is being treated for emotional eating behaviors and is currently taking Lexapro  and Wellbutrin  without issues, even in stressful situations involving his partner's health.  Recently, he experienced an extreme intestinal issue, which he describes as a 'bug.' He has been on the [employer] for the past six days and visited urgent care due to incontinence while asleep. He has not had a bowel movement since the previous morning. His lab tests were normal except for elevated blood sugars at 181, which he attributes to his medications not staying in his system long enough due to the illness. He takes metformin  and Jardiance  for his diabetes.  His partner, Nathanel, is dealing with breast cancer, which has been a source of stress. He is actively involved in her care, attending medical sessions and supporting her emotionally.  He maintains a garden  with insect-repelling plants and various vegetables and fruits to manage stress and provide nutritional benefits. During his recent illness, he has been consuming a diet of ready rice, bananas, applesauce, and peanut butter toast to maintain his food intake despite intestinal issues.  He has a history of dysautonomia, which affects his ability to perform physical activities in hot weather. He manages this by working in the garden during cooler hours and ensures adequate hydration and sodium intake, using sodium tablets instead of salt pills.      PHYSICAL EXAM:  Blood pressure 103/68, pulse 97, temperature 97.8 F (36.6 C), height 5' 10 (1.778 m), weight 285 lb (129.3 kg), SpO2 97%. Body mass index is 40.89 kg/m.  DIAGNOSTIC DATA REVIEWED:  BMET    Component Value Date/Time   NA 139 08/06/2023 1015   NA 142 10/03/2022 0750   K 4.1 08/06/2023 1015   CL 105 08/06/2023 1015   CO2 25 08/06/2023 1015   GLUCOSE 96 08/06/2023 1015   BUN 14 08/06/2023 1015   BUN 13 10/03/2022 0750   CREATININE 0.72 08/06/2023 1015   CREATININE 0.79 12/26/2018 1052   CALCIUM  9.0 08/06/2023 1015   GFRNONAA >60 10/31/2020 1506   GFRNONAA 106 12/26/2018 1052   GFRAA 125 12/21/2019 1433   GFRAA 123 12/26/2018 1052   Lab Results  Component Value Date   HGBA1C 7.5 (A) 07/10/2023   HGBA1C 7.3 (H) 05/19/2007   Lab Results  Component Value Date   INSULIN  7.9 09/06/2020   Lab Results  Component Value Date   TSH 0.91 08/06/2023   CBC    Component Value Date/Time   WBC 7.7 08/06/2023 1015   RBC 4.72 08/06/2023 1015   HGB 15.0 08/06/2023 1015   HCT 45.0 08/06/2023 1015   PLT 223.0 08/06/2023 1015   MCV 95.3 08/06/2023 1015   MCH 32.7 10/31/2020 1506   MCHC 33.2 08/06/2023 1015   RDW 13.6 08/06/2023 1015   Iron Studies    Component Value Date/Time   IRON 144 02/08/2020 1102   TIBC 358 02/08/2020 1102   FERRITIN 96 02/08/2020 1102   IRONPCTSAT 40 02/08/2020 1102   Lipid Panel      Component Value Date/Time   CHOL 160 02/05/2023 1030   CHOL 208 (H) 10/03/2022 0750   TRIG 210.0 (H) 02/05/2023 1030   HDL 39.40 02/05/2023 1030   HDL 34 (L) 10/03/2022 0750   CHOLHDL 4 02/05/2023 1030   VLDL 42.0 (H) 02/05/2023 1030   LDLCALC 78 02/05/2023 1030   LDLCALC 134 (H) 10/03/2022 0750   LDLCALC 74 02/08/2020 1102   LDLDIRECT 125.0 12/26/2018 1052   Hepatic Function Panel     Component Value Date/Time   PROT 7.4 02/05/2023 1030   PROT 6.8 10/03/2022 0750   ALBUMIN 4.6 02/05/2023 1030   ALBUMIN 4.3 10/03/2022 0750   AST 19 02/05/2023 1030   ALT 26 02/05/2023 1030   ALKPHOS 70 02/05/2023 1030   BILITOT 0.8 02/05/2023 1030   BILITOT 0.4 10/03/2022 0750   BILIDIR 0.2 01/31/2022 1042      Component Value Date/Time   TSH 0.91 08/06/2023 1015   Nutritional Lab Results  Component Value Date   VD25OH 32.8 10/03/2022   VD25OH 70.2 03/23/2021   VD25OH 55.3 09/06/2020     Assessment and Plan Assessment & Plan Acute Gastroenteritis He experienced severe gastrointestinal symptoms, including incontinence and diarrhea, necessitating adherence to the BRAT diet for six days. Electrolyte levels were stable, except for elevated blood sugars. Symptoms have resolved, with no bowel movement since the previous morning, indicating recovery. - Ensure adequate hydration and monitor for symptom recurrence. - No further intervention required unless symptoms return.  Type 2 Diabetes Mellitus Elevated blood sugar levels were noted during a recent illness, with fasting levels reaching 181 mg/dL, likely due to gastrointestinal illness affecting medication absorption. He is on metformin  and Jardiance . Blood sugar levels are expected to stabilize as he recovers. - Continue metformin  and Jardiance  as prescribed. - Monitor blood sugar levels during recovery from gastrointestinal illness.  Obesity He has been prescribed a category three eating plan but adheres to it only about 10% of the  time. He engages in yard work for exercise, approximately 30 to 60 minutes once a week. Despite this, he lost five pounds in the last week, likely due to recent gastrointestinal issues. Emphasis on diet and exercise is crucial for managing obesity. - Encourage adherence to the prescribed eating plan. - Promote regular physical activity beyond yard work.  Emotional Eating Disorder He is treated for emotional eating behaviors with Lexapro  and Wellbutrin , reporting no issues and acknowledging their effectiveness, particularly in the context of his wife's breast cancer diagnosis. - Refill Lexapro  and Wellbutrin  prescriptions at [facility] in [facility] on [address].  Dysautonomia He experiences symptoms of dysautonomia, particularly in heat, leading to past falls. Management includes performing outdoor activities during cooler parts of the day and ensuring adequate hydration and sodium intake. - Continue outdoor activities during cooler times of the day. - Maintain hydration and  sodium intake with tablets instead of salt pills.  Caregiver Fatigue He experiences caregiver fatigue due to his wife's breast cancer diagnosis and treatment, actively involved in her care. Acknowledgment of stress and encouragement of self-care is essential. - Encourage participation in caregiver support groups if needed. - Promote self-care and stress management strategies.  Follow-up He is due for follow-up appointments to monitor conditions and treatment progress. - Schedule follow-up appointment in four weeks. - Have the front desk schedule additional appointments as needed.     He was informed of the importance of frequent follow up visits to maximize his success with intensive lifestyle modifications for his multiple health conditions.    Louann Penton, MD

## 2023-09-05 ENCOUNTER — Other Ambulatory Visit (INDEPENDENT_AMBULATORY_CARE_PROVIDER_SITE_OTHER): Payer: Self-pay | Admitting: Family Medicine

## 2023-09-10 ENCOUNTER — Other Ambulatory Visit (INDEPENDENT_AMBULATORY_CARE_PROVIDER_SITE_OTHER): Payer: Self-pay | Admitting: Family Medicine

## 2023-09-10 DIAGNOSIS — F3289 Other specified depressive episodes: Secondary | ICD-10-CM

## 2023-09-24 ENCOUNTER — Encounter (INDEPENDENT_AMBULATORY_CARE_PROVIDER_SITE_OTHER): Payer: Self-pay | Admitting: Family Medicine

## 2023-09-24 ENCOUNTER — Telehealth (INDEPENDENT_AMBULATORY_CARE_PROVIDER_SITE_OTHER): Admitting: Family Medicine

## 2023-09-24 VITALS — BP 113/64 | Ht 70.0 in | Wt 285.0 lb

## 2023-09-24 DIAGNOSIS — Z794 Long term (current) use of insulin: Secondary | ICD-10-CM

## 2023-09-24 DIAGNOSIS — E785 Hyperlipidemia, unspecified: Secondary | ICD-10-CM | POA: Diagnosis not present

## 2023-09-24 DIAGNOSIS — F5089 Other specified eating disorder: Secondary | ICD-10-CM

## 2023-09-24 DIAGNOSIS — Z6841 Body Mass Index (BMI) 40.0 and over, adult: Secondary | ICD-10-CM

## 2023-09-24 DIAGNOSIS — I951 Orthostatic hypotension: Secondary | ICD-10-CM | POA: Diagnosis not present

## 2023-09-24 DIAGNOSIS — E119 Type 2 diabetes mellitus without complications: Secondary | ICD-10-CM | POA: Diagnosis not present

## 2023-09-24 DIAGNOSIS — R42 Dizziness and giddiness: Secondary | ICD-10-CM

## 2023-09-24 DIAGNOSIS — F3289 Other specified depressive episodes: Secondary | ICD-10-CM

## 2023-09-24 DIAGNOSIS — E782 Mixed hyperlipidemia: Secondary | ICD-10-CM

## 2023-09-24 DIAGNOSIS — E669 Obesity, unspecified: Secondary | ICD-10-CM

## 2023-09-24 MED ORDER — ESCITALOPRAM OXALATE 20 MG PO TABS: 20.0000 mg | ORAL_TABLET | Freq: Every day | ORAL | 0 refills | Status: DC

## 2023-09-24 MED ORDER — ATORVASTATIN CALCIUM 80 MG PO TABS: 80.0000 mg | ORAL_TABLET | Freq: Every day | ORAL | 1 refills | Status: DC

## 2023-09-24 MED ORDER — BUPROPION HCL ER (SR) 150 MG PO TB12: 150.0000 mg | ORAL_TABLET | Freq: Every day | ORAL | 0 refills | Status: DC

## 2023-09-24 NOTE — Progress Notes (Signed)
 Office: [phone]  /  Fax: [phone]  WEIGHT SUMMARY AND BIOMETRICS  Anthropometric Measurements Height: 5' 10 (1.778 m) Weight: 285 lb (129.3 kg) (last weight from last visit) BMI (Calculated): 40.89 Starting Weight: 293 lb Peak Weight: 405 lb   No data recorded Other Clinical Data Fasting: no Labs: no Today's Visit #: 47 Starting Date: 09/07/19 Comments: Video Visit    Chief Complaint: OBESITY  Virtual Visit via A/V Note  I connected with Troy [name] [place] on 09/24/23 at  8:20 AM EDT by audiovisual telehealth and verified that I am speaking with the correct person using two identifiers.  Location: Patient: home Provider: clinic   I discussed the limitations, risks, security and privacy concerns of performing an evaluation and management service by AV telehealth and the availability of in person appointments. I also discussed with the patient that there may be a patient responsible charge related to this service. The patient expressed understanding and agreed to proceed.     History of Present Illness Troy [name] is a 53 year old male with orthostatic hypotension and type 2 diabetes who presents for a virtual follow-up visit.  He currently weighs 288 pounds at home, which is slightly higher than his last recorded weight of 285 pounds in the office two months ago. He has been following a category three eating plan most of the time and is responsible for meal planning and preparation for himself and his wife.  He has a history of orthostatic hypotension and is currently taking salt pills to manage this condition. Despite this, he continues to experience episodes of losing balance, falling, and occasionally passing out. He reports that he is being evaluated by specialists. He is not on a low sodium diet.  He has type 2 diabetes and is working on reducing simple carbohydrates in his diet. His blood sugar this morning was 146 mg/dL. He continues to follow  up with his diabetes specialist.  He has a history of hyperlipidemia and is currently taking Lipitor 80 mg. He is also working on dietary changes to decrease cholesterol intake and is focusing on weight loss.  He has a history of emotional eating behaviors and is stable on Wellbutrin  and Lexapro , with no reported side effects such as insomnia or elevated blood pressure. He requests refills for these medications. No insomnia or elevated blood pressure.      PHYSICAL EXAM:  Blood pressure 113/64, height 5' 10 (1.778 m), weight 285 lb (129.3 kg). Body mass index is 40.89 kg/m.  DIAGNOSTIC DATA REVIEWED:  BMET    Component Value Date/Time   NA 139 08/06/2023 1015   NA 142 10/03/2022 0750   K 4.1 08/06/2023 1015   CL 105 08/06/2023 1015   CO2 25 08/06/2023 1015   GLUCOSE 96 08/06/2023 1015   BUN 14 08/06/2023 1015   BUN 13 10/03/2022 0750   CREATININE 0.72 08/06/2023 1015   CREATININE 0.79 12/26/2018 1052   CALCIUM  9.0 08/06/2023 1015   GFRNONAA >60 10/31/2020 1506   GFRNONAA 106 12/26/2018 1052   GFRAA 125 12/21/2019 1433   GFRAA 123 12/26/2018 1052   Lab Results  Component Value Date   HGBA1C 7.5 (A) 07/10/2023   HGBA1C 7.3 (H) 05/19/2007   Lab Results  Component Value Date   INSULIN  7.9 09/06/2020   Lab Results  Component Value Date   TSH 0.91 08/06/2023   CBC    Component Value Date/Time   WBC 7.7 08/06/2023 1015   RBC 4.72 08/06/2023  1015   HGB 15.0 08/06/2023 1015   HCT 45.0 08/06/2023 1015   PLT 223.0 08/06/2023 1015   MCV 95.3 08/06/2023 1015   MCH 32.7 10/31/2020 1506   MCHC 33.2 08/06/2023 1015   RDW 13.6 08/06/2023 1015   Iron Studies    Component Value Date/Time   IRON 144 02/08/2020 1102   TIBC 358 02/08/2020 1102   FERRITIN 96 02/08/2020 1102   IRONPCTSAT 40 02/08/2020 1102   Lipid Panel     Component Value Date/Time   CHOL 160 02/05/2023 1030   CHOL 208 (H) 10/03/2022 0750   TRIG 210.0 (H) 02/05/2023 1030   HDL 39.40 02/05/2023  1030   HDL 34 (L) 10/03/2022 0750   CHOLHDL 4 02/05/2023 1030   VLDL 42.0 (H) 02/05/2023 1030   LDLCALC 78 02/05/2023 1030   LDLCALC 134 (H) 10/03/2022 0750   LDLCALC 74 02/08/2020 1102   LDLDIRECT 125.0 12/26/2018 1052   Hepatic Function Panel     Component Value Date/Time   PROT 7.4 02/05/2023 1030   PROT 6.8 10/03/2022 0750   ALBUMIN 4.6 02/05/2023 1030   ALBUMIN 4.3 10/03/2022 0750   AST 19 02/05/2023 1030   ALT 26 02/05/2023 1030   ALKPHOS 70 02/05/2023 1030   BILITOT 0.8 02/05/2023 1030   BILITOT 0.4 10/03/2022 0750   BILIDIR 0.2 01/31/2022 1042      Component Value Date/Time   TSH 0.91 08/06/2023 1015   Nutritional Lab Results  Component Value Date   VD25OH 32.8 10/03/2022   VD25OH 70.2 03/23/2021   VD25OH 55.3 09/06/2020     Assessment and Plan Assessment & Plan Orthostatic Hypotension Orthostatic hypotension with episodes of imbalance, falls, and syncope. Managed with salt pills and not on a low sodium diet. Under specialist evaluation. Exercise is advised cautiously in areas with support if lightheaded or dizzy. No aggressive exercise recommended. - Continue salt pills - Exercise cautiously in areas with support if lightheaded or dizzy - Avoid aggressive exercise  Type 2 Diabetes Mellitus Type 2 diabetes with blood glucose at 146 mg/dL. Reducing simple carbohydrates in diet. Under Dr. Lynnette management. Nutritional education and weight loss are part of the plan. - Continue nutritional education and weight loss - Follow up with Dr. Margaretta  Hyperlipidemia Hyperlipidemia managed with Lipitor 80 mg. Implementing dietary cholesterol reduction, simple carbohydrate reduction, and weight loss. Requests Lipitor refill. - Refill Lipitor 80 mg - Continue dietary modifications and weight loss  Emotional Eating Behaviors Emotional eating behaviors managed with Wellbutrin  and Lexapro . Denies insomnia or hypertension. Requests refills. - Refill Wellbutrin  - Refill  Lexapro   General Health Maintenance Adhering to a category three eating plan for health maintenance. Engaged in meal planning and preparation. - Continue category three eating plan  Follow-up Reassessment planned to monitor progress and adjust treatment. - Follow up in four weeks    He was informed of the importance of frequent follow up visits to maximize his success with intensive lifestyle modifications for his multiple health conditions.    Louann Penton, MD

## 2023-10-09 DIAGNOSIS — E113313 Type 2 diabetes mellitus with moderate nonproliferative diabetic retinopathy with macular edema, bilateral: Secondary | ICD-10-CM | POA: Diagnosis not present

## 2023-10-15 ENCOUNTER — Encounter: Payer: Self-pay | Admitting: Internal Medicine

## 2023-10-15 ENCOUNTER — Ambulatory Visit (INDEPENDENT_AMBULATORY_CARE_PROVIDER_SITE_OTHER): Admitting: Internal Medicine

## 2023-10-15 VITALS — BP 120/70 | HR 97 | Ht 70.0 in | Wt 286.6 lb

## 2023-10-15 DIAGNOSIS — Z6841 Body Mass Index (BMI) 40.0 and over, adult: Secondary | ICD-10-CM

## 2023-10-15 DIAGNOSIS — E66813 Obesity, class 3: Secondary | ICD-10-CM

## 2023-10-15 DIAGNOSIS — E11319 Type 2 diabetes mellitus with unspecified diabetic retinopathy without macular edema: Secondary | ICD-10-CM

## 2023-10-15 DIAGNOSIS — Z794 Long term (current) use of insulin: Secondary | ICD-10-CM

## 2023-10-15 DIAGNOSIS — E785 Hyperlipidemia, unspecified: Secondary | ICD-10-CM | POA: Diagnosis not present

## 2023-10-15 LAB — POCT GLYCOSYLATED HEMOGLOBIN (HGB A1C): Hemoglobin A1C: 6.6 % — AB (ref 4.0–5.6)

## 2023-10-15 MED ORDER — INSULIN PEN NEEDLE 32G X 4 MM MISC
3 refills | Status: AC
Start: 2023-10-15 — End: ?

## 2023-10-15 MED ORDER — NOVOLOG FLEXPEN 100 UNIT/ML ~~LOC~~ SOPN: 3.0000 [IU] | PEN_INJECTOR | Freq: Two times a day (BID) | SUBCUTANEOUS | 11 refills | Status: DC

## 2023-10-15 NOTE — Patient Instructions (Addendum)
 Please continue: - Metformin  XR 2000 mg at dinnertime - Jardiance  25 mg before b'fast  - VGo30 6 clicks per meal + use 8 clicks for larger meals  - Mounjaro  12.5 mg weekly  Add 3-5 units of NovoLog  before a large meal.  Please return in 4 months.

## 2023-10-15 NOTE — Progress Notes (Signed)
 Subjective:     Patient ID: Troy [name], male   DOB: Sep 30, 1970, 53 y.o.   MRN: [id number]  HPI Troy [name] is a pleasant 53 y.o. man, returning for management of DM2, dx 2012, insulin -dependent, with complications (DR), uncontrolled.  Last visit 3.5 months ago.  Interim history: He continues to work with the weight management clinic.  He lost 13 pounds since our last visit No increased urination, blurry vision (has floaters), nausea, chest pain.  He denies to have tremors.  He still has falls.  He has a watch that can monitor him for falls. His wife was diagnosed with breast cancer since last visit.  He has been worried about her and missed some of his medicines since last visit.  Reviewed HbA1c levels: Lab Results  Component Value Date   HGBA1C 7.5 (A) 07/10/2023   HGBA1C 6.8 (A) 12/21/2022   HGBA1C 7.5 (H) 10/03/2022   He is on: - Metformin  XR 2000 mg in a.m. - Invokana  100 mg >> Jardiance  25 mg before breakfast - VGo 40 >> 30 with 4-6 clicks per meal, but 8 clicks before a larger meal (Novolog ) - Ozempic  2 mg weekly >> off >> Mounjaro  12.5 mg weekly He was on Mounjaro  12.5 mg weekly but not available or also not working as well as Ozempic  for him. He was on Victoza  before >> no SEs.   He checks his sugars more than 4 times a day with his CGM:  Previously:  Previously:   Lowest: 45 >> 50s x1 >> 55 x1 >> 68. Highest: 300 >> 200s >> 400 >> 300s.  -No CKD:  Lab Results  Component Value Date   BUN 14 08/06/2023   BUN 17 02/05/2023   CREATININE 0.72 08/06/2023   CREATININE 0.79 02/05/2023   No MAU: Lab Results  Component Value Date   MICRALBCREAT 7 07/10/2023   MICRALBCREAT <4 10/03/2022   MICRALBCREAT 6 02/08/2020   MICRALBCREAT 0.6 07/29/2009   MICRALBCREAT 9.9 07/16/2008  He is not on ACE inhibitor/ARB.  -+ HL: Lab Results  Component Value Date   CHOL 160 02/05/2023   HDL 39.40 02/05/2023   LDLCALC 78 02/05/2023   LDLDIRECT 125.0 12/26/2018   TRIG  210.0 (H) 02/05/2023   CHOLHDL 4 02/05/2023  On Lipitor 20 >> 40 >> 80 (increased 12/30/2022).  - Last eye exam was 11/2022 + DR.  He sees a retina specialist.  He started intraocular injections - for macular edema OU.  - No numbness and tingling in feet - sees podiatry.  Previously on gabapentin , then on Lyrica , then off.  Last foot exam was by Dr. Orland 09/03/2023 - Note reviewed:   He has OSA and is compliant with his CPAP. He had a DVT in R leg in 04/2015.  In 2020: He had another DVT episode (his 3rd: 2003, 2017, 2020) >> on Eliquis .  He noticed that sugars were higher after he started Eliquis . He lost 45 lbs before 2017 - mostly vegan food.  He has a history of ADD. He did see urology before and was given Viagra, but causes for ED were not explored. We checked his testosterone  level.  This returned mildly low: Component     Latest Ref Rng & Units 04/27/2019  Testosterone , Serum (Total)     ng/dL 737 (L)  % Free Testosterone      % 1.9  Free Testosterone , S     pg/mL 50 (L)  Sex Hormone Binding Globulin     nmol/L 34.5  After the above results, I referred him to the weight management clinic. He was started on Citalopram for presumed dx. Of andropause - feels much better - his irritability resolved.  After an episode of syncope, we checked him for adrenal insufficiency -normal cosyntropin  stimulation test: Component     Latest Ref Rng & Units 11/24/2020 11/24/2020 11/24/2020         2:08 PM  2:39 PM  3:05 PM  Cortisol, Plasma     ug/dL 9.1 78.1 71.5   He had epidural steroid injections (02/2020 and 03/2020) and then cervical decompression surgery (diskectomy, fusion) on 05/25/2020. He experienced vertigo in 09/2020, before a diagnosis of COVID-19.  He then had a syncopal episode (CBG 160).  He had milder episodes afterwards - but not recently.  No orthostasis.  We checked him for adrenal insufficiency and investigation was negative after last visit.  He developed leg weakness  and had several falls.  He had extensive investigation >> negative. He was referred to cardiology in [place] - now has a diagnosis of dysautonomia (not POTS). He is on a high salt diet, medication, and compression garment.   Review of Systems: + See HPI   I reviewed pt's medications, allergies, PMH, social hx, family hx, and changes were documented in the history of present illness. Otherwise, unchanged from my initial visit note.  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   Diabetes insipidus ([name])    Diabetes mellitus without complication ([name])    TYPE II   DVT (deep venous thrombosis) ([name])    right leg last time 2020   Dysautonomia ([name]) 07/16/2022   Leg edema, right    Male infertility    Neck injury 12/22/2013   Neuropathy    Obesity    Obesity    OSA (obstructive sleep apnea)    CPAP   Osteoarthritis of spine with radiculopathy, 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   POTS (postural orthostatic tachycardia syndrome) 04/05/2021   Referred otalgia of right ear 07/13/2021   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   Past Surgical History:  Procedure Laterality Date   ANTERIOR CERVICAL DECOMP/DISCECTOMY FUSION N/A 05/25/2020   Procedure: ANTERIOR CERVICAL DECOMPRESSION/DISCECTOMY FUSION, INTERBODY PROSTHESIS, PLATE/SCREWS CERVICAL FIVE- CERVICAL SIX;  Surgeon: Mavis Purchase, MD;  Location: [facility] OR;  Service: Neurosurgery;  Laterality: N/A;   BACK SURGERY     CERVICAL DISC SURGERY     C6/C7 2009   cubital tunnel left arm     2003   ELBOW SURGERY     FIBULA FRACTURE SURGERY     plate & pin removed  due to infection 1997   ULNAR NERVE REPAIR     Social History   Socioeconomic History   Marital status: Married    Spouse name: Nathanel   Number of children: 1   Years of education: Not on file   Highest education level: Bachelor's degree (e.g., BA, AB, BS)  Occupational History   Occupation: Customer Service Lead  Tobacco Use   Smoking status: Never  Smokeless tobacco: Never  Vaping Use   Vaping status: Never Used  Substance and Sexual Activity   Alcohol use: Yes    Comment: 1 - 2 a year   Drug use: No   Sexual activity: Yes  Other Topics Concern   Not on file  Social History Narrative   Occupation:  days [employer] 11- 8 am  Now changed job and shift to [employer] through Thursday    Working  Ft 40 hours mostly   Married 10 10 10    Wife s/p bariatric surgery pt   Regular exercise- yes   Pt does have children   Father died suddenly 05/07/2008   Mom passes 2017  Acute rep disease after acute renal failure   Daily caffeine use one a day   2 dogs and cat.       Right Handed             Social Drivers of Health   Financial Resource Strain: Low Risk  (07/01/2023)   Overall Financial Resource Strain (CARDIA)    Difficulty of Paying Living Expenses: Not hard at all  Food Insecurity: No Food Insecurity (07/01/2023)   Hunger Vital Sign    Worried About Running Out of Food in the Last Year: Never true    Ran Out of Food in the Last Year: Never true  Transportation Needs: No Transportation Needs (07/01/2023)   PRAPARE - Administrator, Civil Service (Medical): No    Lack of Transportation (Non-Medical): No  Physical Activity: Insufficiently Active (07/01/2023)   Exercise Vital Sign    Days of Exercise per Week: 2 days    Minutes of Exercise per Session: 10 min  Stress: No Stress Concern Present (07/01/2023)   [employer] of Occupational Health - Occupational Stress Questionnaire    Feeling of Stress : Only a little  Social Connections: Moderately  Isolated (07/01/2023)   Social Connection and Isolation Panel    Frequency of Communication with Friends and Family: Twice a week    Frequency of Social Gatherings with Friends and Family: Once a week    Attends Religious Services: Never    Database administrator or Organizations: No    Attends Banker Meetings: Never    Marital Status: Married  Catering manager Violence: Not At Risk (02/05/2023)   Humiliation, Afraid, Rape, and Kick questionnaire    Fear of Current or Ex-Partner: No    Emotionally Abused: No    Physically Abused: No    Sexually Abused: No   Current Outpatient Medications on File Prior to Visit  Medication Sig Dispense Refill   albuterol  (VENTOLIN  HFA) 108 (90 Base) MCG/ACT inhaler Inhale 2 puffs into the lungs every 6 (six) hours as needed. For wheezing 1 each 2   atorvastatin  (LIPITOR) 80 MG tablet Take 1 tablet (80 mg total) by mouth daily. 90 tablet 1   buPROPion  (WELLBUTRIN  SR) 150 MG 12 hr tablet Take 1 tablet (150 mg total) by mouth daily. 30 tablet 0   cholecalciferol  (VITAMIN D3) 25 MCG (1000 UNIT) tablet Take 1,000 Units by mouth daily.     Continuous Glucose Sensor (DEXCOM G7 SENSOR) MISC Use every 10 days 9 each 4   ELIQUIS  5 MG TABS tablet TAKE 1 TABLET TWICE A DAY 180 tablet 2   empagliflozin  (JARDIANCE ) 25 MG TABS tablet TAKE 1 TABLET BY MOUTH EVERY DAY BEFORE BREAKFAST 90 tablet 3   escitalopram  (LEXAPRO ) 20 MG tablet Take  1 tablet (20 mg total) by mouth daily. 30 tablet 0   fludrocortisone (FLORINEF) 0.1 MG tablet Take 0.1 mg by mouth 2 (two) times daily.     insulin  aspart (NOVOLOG ) 100 UNIT/ML injection Use 80 units a day in the insulin  pump 90 mL 3   Insulin  Disposable Pump (V-GO 30) 30 UNIT/24HR KIT USE AND DISCARD 1 V-GO     DEVICE DAILY 90 kit 3   metFORMIN  (GLUCOPHAGE -XR) 500 MG 24 hr tablet TAKE 4 TABLETS DAILY WITH BREAKFAST 360 tablet 3   SODIUM CHLORIDE  PO Take 4 g by mouth.     tirzepatide  (MOUNJARO ) 12.5 MG/0.5ML Pen Inject 12.5  mg into the skin once a week. 6 mL 3   No current facility-administered medications on file prior to visit.   No Known Allergies Family History  Problem Relation Age of Onset   Heart disease Mother    Other Mother        clotting disorders   Diabetes Mother    High blood pressure Mother    Kidney disease Mother    Depression Mother    Sleep apnea Mother    Obesity Mother    Other Father        sudden death/cervical and lumbar disk disease   Aneurysm Father        In his 31s smoked   Hyperlipidemia Father    High blood pressure Father    Sudden death Father    Alcoholism Father    Hypertension Other    Allergies Other    Deep vein thrombosis Other    Sleep apnea Other    Obesity Other     Objective:   Physical Exam BP 120/70   Pulse 97   Ht 5' 10 (1.778 m)   Wt 286 lb 9.6 oz (130 kg)   SpO2 94%   BMI 41.12 kg/m   Wt Readings from Last 10 Encounters:  10/15/23 286 lb 9.6 oz (130 kg)  09/24/23 285 lb (129.3 kg)  09/03/23 285 lb (129.3 kg)  08/06/23 (!) 300 lb 6.4 oz (136.3 kg)  07/11/23 290 lb (131.5 kg)  07/10/23 298 lb 3.2 oz (135.3 kg)  03/05/23 286 lb (129.7 kg)  02/20/23 289 lb (131.1 kg)  02/05/23 286 lb 3.2 oz (129.8 kg)  01/16/23 281 lb (127.5 kg)   Constitutional: overweight, in NAD Eyes: EOMI, no exophthalmos ENT: no thyromegaly, no cervical lymphadenopathy Cardiovascular: Tachycardia, RR, No MRG, + RLE swelling -chronic, after his DVT, wears compression hoses Respiratory: CTA B Musculoskeletal: no deformities Skin: no rashes Neurological: + tremors with outstretched hands  Assessment:     1. DM2, insulin -dependent, uncontrolled, with complications - DR  2. Obesity class 3  3. HL  Plan:     1. Patient with history of uncontrolled diabetes, on oral antidiabetic regimen with metformin  and SGLT2 inhibitor and also weekly GLP-1/GIP receptor agonist along with the V-Go patch pump, with improved control previously but higher HbA1c at last  visit, at 7.5%.  Sugars were higher than before, mostly fluctuating around the upper limit of the target range for the majority of the day and slightly better at night.  Sugars were increasing up to 300s.  He mentioned that he was using expired metformin  (from 5 years prior) without realizing until right before the appointment.  He got a new prescription for metformin  and sugars did start to improve after using this.  We continued the rest of the regimen. CGM interpretation: -At today's visit, we reviewed  his CGM downloads: It appears that 53% of values are in target range (goal >70%), while 47% are higher than 180 (goal <25%), and 0% are lower than 70 (goal <4%).  The calculated average blood sugar is 176.  The projected HbA1c for the next 3 months (GMI) is 7.5%. -Reviewing the CGM trends, sugars appear to be increasing throughout the day, after every meal, in a stepwise fashion.  Sugars after dinner are consistently elevated.  In the last 2 weeks, he only had 1 day, 3 days ago, in which sugars were excellent throughout the day.  He eats a regular diet in the day but he did not have carbs with dinner.  At today's visit we discussed that with the majority of his symptoms he will need to add more insulin .  I recommended to use a separate NovoLog  insulin  pen and add extra insulin  before dinner but also if he has a larger lunch or breakfast.  Will continue the rest of the regimen otherwise.  I did recommend to try to inject his NovoLog  15 minutes before the meals, as he is now injecting at the start of the meal. - I advised him to: Patient Instructions  Please continue: - Metformin  XR 2000 mg at dinnertime - Jardiance  25 mg before b'fast  - VGo30 6 clicks per meal + use 8 clicks for larger meals  - Mounjaro  12.5 mg weekly  Add 3-5 units of NovoLog  before a large meal.  Please return in 4 months.   - we checked his HbA1c: 6.6% (lower, and lower than expected from his CGM) - advised to check sugars at  different times of the day - 4x a day, rotating check times - advised for yearly eye exams >> he is UTD - return to clinic in 4 months  2. Obesity class 3 - will continue the SGLT2 inhibitor and GLP-1/GIP receptor agonist, which should also help with weight loss -He is seen in the weight management clinic.  Before last visit, he gained weight which he attributes to retaining fluid due to cardiology advising him to increase his sodium intake to help with his dysautonomia. - He gained 5 pounds before last visit and 12 pounds since then  3. HL - He was also found to have coronary artery calcifications and he sees cardiology - Latest lipid panel was reviewed from 01/2023: LDL above target, triglycerides elevated: Lab Results  Component Value Date   CHOL 160 02/05/2023   HDL 39.40 02/05/2023   LDLCALC 78 02/05/2023   LDLDIRECT 125.0 12/26/2018   TRIG 210.0 (H) 02/05/2023   CHOLHDL 4 02/05/2023  - His Lipitor was increased to 80 mg daily in 12/2022.  He tolerates this well.  Lela Fendt, MD PhD [facility] Endocrinology

## 2023-10-16 MED ORDER — INSULIN ASPART 100 UNIT/ML IJ SOLN: INTRAMUSCULAR | 3 refills | Status: DC

## 2023-10-18 DIAGNOSIS — H5319 Other subjective visual disturbances: Secondary | ICD-10-CM | POA: Diagnosis not present

## 2023-10-22 ENCOUNTER — Other Ambulatory Visit (INDEPENDENT_AMBULATORY_CARE_PROVIDER_SITE_OTHER): Payer: Self-pay | Admitting: Family Medicine

## 2023-10-22 ENCOUNTER — Ambulatory Visit (INDEPENDENT_AMBULATORY_CARE_PROVIDER_SITE_OTHER): Admitting: Family Medicine

## 2023-10-22 ENCOUNTER — Encounter (INDEPENDENT_AMBULATORY_CARE_PROVIDER_SITE_OTHER): Payer: Self-pay | Admitting: Family Medicine

## 2023-10-22 VITALS — BP 101/68 | HR 104 | Temp 97.5°F | Ht 70.0 in | Wt 282.0 lb

## 2023-10-22 DIAGNOSIS — F3289 Other specified depressive episodes: Secondary | ICD-10-CM

## 2023-10-22 DIAGNOSIS — Z6841 Body Mass Index (BMI) 40.0 and over, adult: Secondary | ICD-10-CM

## 2023-10-22 DIAGNOSIS — E669 Obesity, unspecified: Secondary | ICD-10-CM

## 2023-10-22 DIAGNOSIS — Z7985 Long-term (current) use of injectable non-insulin antidiabetic drugs: Secondary | ICD-10-CM

## 2023-10-22 DIAGNOSIS — Z7984 Long term (current) use of oral hypoglycemic drugs: Secondary | ICD-10-CM

## 2023-10-22 DIAGNOSIS — Z794 Long term (current) use of insulin: Secondary | ICD-10-CM

## 2023-10-22 DIAGNOSIS — I951 Orthostatic hypotension: Secondary | ICD-10-CM | POA: Diagnosis not present

## 2023-10-22 DIAGNOSIS — E66813 Obesity, class 3: Secondary | ICD-10-CM

## 2023-10-22 DIAGNOSIS — E119 Type 2 diabetes mellitus without complications: Secondary | ICD-10-CM | POA: Diagnosis not present

## 2023-10-22 DIAGNOSIS — F329 Major depressive disorder, single episode, unspecified: Secondary | ICD-10-CM

## 2023-10-22 MED ORDER — ESCITALOPRAM OXALATE 20 MG PO TABS: 20.0000 mg | ORAL_TABLET | Freq: Every day | ORAL | 0 refills | Status: DC

## 2023-10-22 MED ORDER — BUPROPION HCL ER (SR) 150 MG PO TB12
150.0000 mg | ORAL_TABLET | Freq: Every day | ORAL | 0 refills | Status: DC
Start: 2023-10-22 — End: 2023-11-19

## 2023-10-22 NOTE — Progress Notes (Signed)
 Office: [phone]  /  Fax: [phone]  WEIGHT SUMMARY AND BIOMETRICS  Anthropometric Measurements Height: 5' 10 (1.778 m) Weight: 282 lb (127.9 kg) BMI (Calculated): 40.46 Weight at Last Visit: 285 lb Weight Lost Since Last Visit: 3 lb Weight Gained Since Last Visit: 0 Starting Weight: 293 lb Total Weight Loss (lbs): 11 lb (4.99 kg) Peak Weight: 405 lb   Body Composition  Body Fat %: 38.9 % Fat Mass (lbs): 109.6 lbs Muscle Mass (lbs): 164 lbs Total Body Water (lbs): 123.6 lbs Visceral Fat Rating : 23   Other Clinical Data Fasting: no Labs: no Today's Visit #: 48 Starting Date: 09/07/19    Chief Complaint: OBESITY    History of Present Illness Troy [name] is a 53 year old male with obesity and type 2 diabetes who presents for obesity treatment and progress assessment.  He has been following a category three eating plan with approximately 90% compliance, resulting in a weight loss of three pounds over the last month. However, he has not been engaging in physical exercise due to worsening orthostatic hypotension, which causes dizziness and instability.  He experiences significant orthostatic hypotension, feeling dizzy and unstable, particularly when transitioning from hot to cooler environments. He describes episodes of nearly falling, including a recent incident where he fell between cars, injuring his knees, rib, and cheek. Lack of sleep and heat exacerbate his symptoms.  For his type 2 diabetes, he is on Novolog , Jardiance , metformin , and Mounjaro . He has experienced glucose readings above 300 and is at goal about 50% of the time. His hemoglobin A1c has improved to 6.6. He is having trouble filling his Novolog  prescription due to insurance issues with the Aspart pen.  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 He sometimes forgets to eat due to getting engrossed in activities like reading or gaming. He reports  not feeling hungry often, which affects his meal schedule.  He is currently on bupropion  and Lexapro  for mood management and feels he is handling stress well. He tries to maintain a routine by eating with his family to ensure regular meals.      PHYSICAL EXAM:  Blood pressure 101/68, pulse (!) 104, temperature (!) 97.5 F (36.4 C), height 5' 10 (1.778 m), weight 282 lb (127.9 kg), SpO2 97%. Body mass index is 40.46 kg/m.  DIAGNOSTIC DATA REVIEWED:  BMET    Component Value Date/Time   NA 139 08/06/2023 1015   NA 142 10/03/2022 0750   K 4.1 08/06/2023 1015   CL 105 08/06/2023 1015   CO2 25 08/06/2023 1015   GLUCOSE 96 08/06/2023 1015   BUN 14 08/06/2023 1015   BUN 13 10/03/2022 0750   CREATININE 0.72 08/06/2023 1015   CREATININE 0.79 12/26/2018 1052   CALCIUM  9.0 08/06/2023 1015   GFRNONAA >60 10/31/2020 1506   GFRNONAA 106 12/26/2018 1052   GFRAA 125 12/21/2019 1433   GFRAA 123 12/26/2018 1052   Lab Results  Component Value Date   HGBA1C 6.6 (A) 10/15/2023   HGBA1C 7.3 (H) 05/19/2007   Lab Results  Component Value Date   INSULIN  7.9 09/06/2020   Lab Results  Component Value Date   TSH 0.91 08/06/2023   CBC    Component Value Date/Time   WBC 7.7 08/06/2023 1015   RBC 4.72 08/06/2023 1015   HGB 15.0 08/06/2023 1015   HCT 45.0 08/06/2023 1015   PLT 223.0 08/06/2023 1015   MCV 95.3 08/06/2023 1015  MCH 32.7 10/31/2020 1506   MCHC 33.2 08/06/2023 1015   RDW 13.6 08/06/2023 1015   Iron Studies    Component Value Date/Time   IRON 144 02/08/2020 1102   TIBC 358 02/08/2020 1102   FERRITIN 96 02/08/2020 1102   IRONPCTSAT 40 02/08/2020 1102   Lipid Panel     Component Value Date/Time   CHOL 160 02/05/2023 1030   CHOL 208 (H) 10/03/2022 0750   TRIG 210.0 (H) 02/05/2023 1030   HDL 39.40 02/05/2023 1030   HDL 34 (L) 10/03/2022 0750   CHOLHDL 4 02/05/2023 1030   VLDL 42.0 (H) 02/05/2023 1030   LDLCALC 78 02/05/2023 1030   LDLCALC 134 (H) 10/03/2022  0750   LDLCALC 74 02/08/2020 1102   LDLDIRECT 125.0 12/26/2018 1052   Hepatic Function Panel     Component Value Date/Time   PROT 7.4 02/05/2023 1030   PROT 6.8 10/03/2022 0750   ALBUMIN 4.6 02/05/2023 1030   ALBUMIN 4.3 10/03/2022 0750   AST 19 02/05/2023 1030   ALT 26 02/05/2023 1030   ALKPHOS 70 02/05/2023 1030   BILITOT 0.8 02/05/2023 1030   BILITOT 0.4 10/03/2022 0750   BILIDIR 0.2 01/31/2022 1042      Component Value Date/Time   TSH 0.91 08/06/2023 1015   Nutritional Lab Results  Component Value Date   VD25OH 32.8 10/03/2022   VD25OH 70.2 03/23/2021   VD25OH 55.3 09/06/2020     Assessment and Plan Assessment & Plan Obesity He follows a category three eating plan with 90% adherence, resulting in a three-pound weight loss over the last month. He reports decreased hunger and sometimes forgets to eat, particularly lunch, due to an addictive personality. No exercise is performed due to worsening orthostatic hypotension. - Continue category three eating plan. - Set alarms to remind him to eat regularly, especially lunch.  Type 2 diabetes mellitus Managed with Novolog , Jardiance , metformin , and Mounjaro . Hemoglobin A1c improved to 6.6, but glucose readings are occasionally elevated, especially postprandial. Insurance issues with Aspart pen; Novolog  and Humalog  Quick pen are covered. He is in contact with Dr. Lynnette office to resolve the issue. - Continue current diabetes medications as directed by Dr Trixie - Continue diet, exercise and weight loss as discussed today as an important part of the treatment plan   Orthostatic hypotension Worsened, causing dizziness and instability, especially with temperature changes and lack of sleep. Multiple falls have resulted in injuries. Current blood pressure is 102/68 on 5 grams of sodium. He is considering a fall-detection vest, but cost is prohibitive. - Encourage safety measures to prevent falls. - Consider ways to fund a fall  detection vest to avoid serious injury  Major depressive disorder Managed with bupropion  and Lexapro . He reports handling stressors well and maintaining a positive outlook despite challenges. - Continue bupropion  and Lexapro . - Will continue to monitor closely   He was informed of the importance of frequent follow up visits to maximize his success with intensive lifestyle modifications for his multiple health conditions.    Louann Penton, MD

## 2023-11-07 MED ORDER — NOVOLOG FLEXPEN 100 UNIT/ML ~~LOC~~ SOPN: 3.0000 [IU] | PEN_INJECTOR | Freq: Two times a day (BID) | SUBCUTANEOUS | 11 refills | Status: AC

## 2023-11-07 MED ORDER — INSULIN ASPART 100 UNIT/ML IJ SOLN: INTRAMUSCULAR | 3 refills | Status: AC

## 2023-11-07 NOTE — Addendum Note (Signed)
 Addended by: CLEOTILDE ROLIN RAMAN on: 11/07/2023 07:59 AM   Modules accepted: Orders

## 2023-11-19 ENCOUNTER — Ambulatory Visit (INDEPENDENT_AMBULATORY_CARE_PROVIDER_SITE_OTHER): Admitting: Physician Assistant

## 2023-11-19 ENCOUNTER — Encounter (INDEPENDENT_AMBULATORY_CARE_PROVIDER_SITE_OTHER): Payer: Self-pay | Admitting: Physician Assistant

## 2023-11-19 VITALS — BP 104/68 | HR 97 | Temp 97.7°F | Ht 70.0 in | Wt 281.0 lb

## 2023-11-19 DIAGNOSIS — E118 Type 2 diabetes mellitus with unspecified complications: Secondary | ICD-10-CM | POA: Diagnosis not present

## 2023-11-19 DIAGNOSIS — F5089 Other specified eating disorder: Secondary | ICD-10-CM

## 2023-11-19 DIAGNOSIS — F3289 Other specified depressive episodes: Secondary | ICD-10-CM | POA: Diagnosis not present

## 2023-11-19 DIAGNOSIS — Z6841 Body Mass Index (BMI) 40.0 and over, adult: Secondary | ICD-10-CM

## 2023-11-19 DIAGNOSIS — Z7985 Long-term (current) use of injectable non-insulin antidiabetic drugs: Secondary | ICD-10-CM

## 2023-11-19 DIAGNOSIS — E66813 Obesity, class 3: Secondary | ICD-10-CM

## 2023-11-19 DIAGNOSIS — Z794 Long term (current) use of insulin: Secondary | ICD-10-CM

## 2023-11-19 DIAGNOSIS — G90A Postural orthostatic tachycardia syndrome (POTS): Secondary | ICD-10-CM

## 2023-11-19 DIAGNOSIS — E669 Obesity, unspecified: Secondary | ICD-10-CM

## 2023-11-19 MED ORDER — ESCITALOPRAM OXALATE 20 MG PO TABS: 20.0000 mg | ORAL_TABLET | Freq: Every day | ORAL | 0 refills | Status: DC

## 2023-11-19 MED ORDER — BUPROPION HCL ER (SR) 150 MG PO TB12: 150.0000 mg | ORAL_TABLET | Freq: Every day | ORAL | 0 refills | Status: DC

## 2023-11-19 NOTE — Progress Notes (Signed)
 SUBJECTIVE: Discussed the use of AI scribe software for clinical note transcription with the patient, who gave verbal consent to proceed.  Chief Complaint: Obesity  Interim History: He is down 1 lb since last visit.  Down 12 lbs overall since starting with [facility] TBW loss of 4.1%  Down 124 lbs from peak weight TBW loss of 30.6% from peak weight  Troy [name] is here to discuss his progress with his obesity treatment plan. He is on the Category 3 Plan and states he is following his eating plan approximately 94-95 % of the time. He states he is not exercising. Troy [name] is a 53 year old male with obesity, type 2 diabetes, and postural orthostatic tachycardia syndrome who presents for follow-up of his obesity treatment plan.  He is on a treatment plan for obesity, which includes Mounjaro  12.5 mg weekly. He has lost 12 pounds overall, with a recent decrease of 1 pound since his last visit. He has a history of emotional eating behavior and is following a meal plan with his wife, who is also on the same plan. He previously weighed 405 pounds and has lost a significant amount of weight prior to starting with the current medical team.  He has type 2 diabetes and is on metformin  2000 mg daily with breakfast, an insulin  pump, and uses a flip pen for mealtime and nighttime insulin  coverage. He also takes Jardiance . His continuous glucose monitor has shown good control, with most readings in range and very few lows. His last A1c was 6.8. He experienced a low of 50 recently, which he managed with a tablespoon of honey. No stomach upset from metformin .  He has postural orthostatic tachycardia syndrome and reports occasional falls, including a recent fall onto a couch while assisting his wife. He has a history of more severe falls, resulting in permanent scars on his knees and a rib injury. He uses a walker for assistance after falls.  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 He underwent multiple surgeries, including tendon transplants, to regain some function. He experiences normal shakiness due to this nerve damage.  He is on bupropion  and Lexapro  for mood stabilization,  reports he initially started due to signs of andropause and irrational rages. He reports no side effects from these medications. He is currently in the process of applying for disability due to his frequent falls and inability to work.  His wife is undergoing treatment for breast cancer, and he assists with her care, including meal preparation and wound care. He follows a meal plan that includes chicken thighs and berries, which he prioritizes for her recovery.  OBJECTIVE: Visit Diagnoses: Problem List Items Addressed This Visit     Type 2 diabetes mellitus with complication, with long-term current use of insulin  ([name]) - Primary   Depression   Relevant Medications   buPROPion  (WELLBUTRIN  SR) 150 MG 12 hr tablet   escitalopram  (LEXAPRO ) 20 MG tablet   BMI 40.0-44.9, adult ([name])   Other Visit Diagnoses       POTS (postural orthostatic tachycardia syndrome)         Obesity, starting BMI 40.87          Obesity Obesity, current BMI of 40. Down 12 pounds overall and 1 pound since last visit. Adipose percentage is 38.6%. Goal is to reduce adipose percentage to 25% or less. Currently on a category  3 plan and adheres to it 90% of the time. Previously weighed 405 pounds and has lost significant weight prior to current treatment plan. Maximizing medical management with current medications and dietary plan. - Continue to focus on adequate protein intake.  - physical activity within tolerance as directed by your providers due to history of falls.   Type 2 diabetes mellitus on long term current insulin  therapy Type 2 diabetes mellitus managed with Mounjaro  12.5 mg weekly, metformin  2000 mg daily, Jardiance , and an insulin  pump. Also  uses novolog  Flex pen for mealtime coverage due to occasional spikes. Continuous glucose monitor shows 92-94% time in range with no extreme highs and less than 1% lows. Recent A1c was 6.6, projected A1c based on recent CGM expected to be 6.3. Experienced a low of 50 recently, managed with honey. No significant issues with current medication regimen. Lab Results  Component Value Date   HGBA1C 6.6 (A) 10/15/2023   HGBA1C 7.5 (A) 07/10/2023   HGBA1C 6.8 (A) 12/21/2022   Lab Results  Component Value Date   MICROALBUR 0.6 07/10/2023   LDLCALC 78 02/05/2023   CREATININE 0.72 08/06/2023  Plan:  He is working  on nutrition plan to decrease simple carbohydrates, increase lean proteins and exercise to promote weight loss and improve glycemic control . Continue usual insulin /Mounjaro /Jardiance /metformin  per endocrinology- Dr. Trixie.    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 Recent fall occurred while assisting wife, but no injuries sustained. Falls in the past have resulted in injuries, including permanent scars and nerve damage. Currently on Florinef 0.1 mg BID and 5 grams of sodium daily to manage blood pressure. No effect on blood pressure when sodium was reduced. Awaiting neurology appointment for further evaluation of new symptoms- new tremors that started 8 months ago. - Reach out to neurology for appointment regarding new symptoms.  Other depression with emotional eating Depression managed with bupropion  SR 150 mg daily and Lexapro  20 mg daily. No reported side effects from medications. Medications perceived as helpful for mood stabilization. Initially started on medication due to signs of andropause and irrational rages, which affected work performance. He is currently pursuing disability. Some increased stressors with wife's treatment for breast cancer.  - Refill bupropion  SR 150 mg  daily and Lexapro  20 mg daily prescriptions at [facility] in [facility]. Continue to work on emotional eating strategies.  Meds ordered this encounter  Medications   buPROPion  (WELLBUTRIN  SR) 150 MG 12 hr tablet    Sig: Take 1 tablet (150 mg total) by mouth daily.    Dispense:  30 tablet    Refill:  0   escitalopram  (LEXAPRO ) 20 MG tablet    Sig: Take 1 tablet (20 mg total) by mouth daily.    Dispense:  30 tablet    Refill:  0    Vitals Temp: 97.7 F (36.5 C) BP: 104/68 Pulse Rate: 97 SpO2: 98 %   Anthropometric Measurements Height: 5' 10 (1.778 m) Weight: 281 lb (127.5 kg) BMI (Calculated): 40.32 Weight at Last Visit: 282 lb Weight Lost Since Last Visit: 1 Weight Gained Since Last Visit: 0 Starting Weight: 293 lb Total Weight Loss (lbs): 12 lb (5.443 kg)   Body Composition  Body Fat %: 38.6 % Fat Mass (lbs): 108.6 lbs Muscle Mass (lbs): 164.6 lbs Total Body Water (lbs): 123.2 lbs Visceral Fat Rating : 23   Other Clinical Data Fasting: yes Labs: no Today's Visit #: 49 Starting Date: 09/07/19  Comments: Cat 3     ASSESSMENT AND PLAN:  Diet: Orry is currently in the action stage of change. As such, his goal is to continue with weight loss efforts. He has agreed to Category 3 Plan.  Exercise: Hobert has been instructed to try a geriatric exercise plan and that some exercise is better than none for weight loss and overall health benefits.   Behavior Modification:  We discussed the following Behavioral Modification Strategies today: increasing lean protein intake, decreasing simple carbohydrates, increasing vegetables, increase H2O intake, increase high fiber foods, no skipping meals, meal planning and cooking strategies, emotional eating strategies , avoiding temptations, and planning for success. We discussed various medication options to help Drayson with his weight loss efforts and we both agreed to continue current treatment plan.  Return in about 4  weeks (around 12/17/2023).SABRA He was informed of the importance of frequent follow up visits to maximize his success with intensive lifestyle modifications for his multiple health conditions.  Attestation Statements:   Reviewed by clinician on day of visit: allergies, medications, problem list, medical history, surgical history, family history, social history, and previous encounter notes.   Time spent on visit including pre-visit chart review and post-visit care and charting was 33 minutes.    Margaretta Chittum, PA-C

## 2023-11-20 ENCOUNTER — Other Ambulatory Visit (INDEPENDENT_AMBULATORY_CARE_PROVIDER_SITE_OTHER): Payer: Self-pay | Admitting: Family Medicine

## 2023-11-20 DIAGNOSIS — F3289 Other specified depressive episodes: Secondary | ICD-10-CM

## 2023-11-28 DIAGNOSIS — G4733 Obstructive sleep apnea (adult) (pediatric): Secondary | ICD-10-CM | POA: Diagnosis not present

## 2023-12-04 DIAGNOSIS — E113313 Type 2 diabetes mellitus with moderate nonproliferative diabetic retinopathy with macular edema, bilateral: Secondary | ICD-10-CM | POA: Diagnosis not present

## 2023-12-04 LAB — HM DIABETES EYE EXAM

## 2023-12-10 ENCOUNTER — Telehealth (INDEPENDENT_AMBULATORY_CARE_PROVIDER_SITE_OTHER): Admitting: Internal Medicine

## 2023-12-10 ENCOUNTER — Encounter: Payer: Self-pay | Admitting: Internal Medicine

## 2023-12-10 VITALS — Ht 70.0 in | Wt 278.0 lb

## 2023-12-10 DIAGNOSIS — R200 Anesthesia of skin: Secondary | ICD-10-CM | POA: Diagnosis not present

## 2023-12-10 DIAGNOSIS — R251 Tremor, unspecified: Secondary | ICD-10-CM

## 2023-12-10 DIAGNOSIS — Z794 Long term (current) use of insulin: Secondary | ICD-10-CM | POA: Diagnosis not present

## 2023-12-10 DIAGNOSIS — E118 Type 2 diabetes mellitus with unspecified complications: Secondary | ICD-10-CM | POA: Diagnosis not present

## 2023-12-10 NOTE — Progress Notes (Signed)
 Virtual Visit via Video Note  I connected with Troy [name] on 12/10/23 at  8:30 AM EDT by a video enabled telemedicine application and verified that I am speaking with the correct person using two identifiers. Location patient: home Location provider:work office Persons participating in the virtual visit: patient, provider   Patient aware  of the limitations of evaluation and management by telemedicine and  availability of in person appointments. and agreed to proceed.   HPI: Troy [name] presents for video visit progressive left hand numbness and tremor over months  without trauma and no excess pain. Sometimes feels like going to drop things from hands . Worse when driving . NO new sx other extremities and neck  Referral done last time  was dropped because facility specifics  closed  and thus referral not completed to another provider.  Dm is controlled A1c doesn't  to 6.6 and 6.3 Still falling  at times   ortho stasis   .  Hx of cervical radiculitis but this is not related .  ROS: See pertinent positives and negatives per HPI.  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   Diabetes insipidus    Diabetes mellitus without complication ([name])    TYPE II   DVT (deep venous thrombosis) ([name])    right leg last time 2020   Dysautonomia ([name]) 07/16/2022   Leg edema, right    Male infertility    Neck injury 12/22/2013   Neuropathy    Obesity    Obesity    OSA (obstructive sleep apnea)    CPAP   Osteoarthritis of spine with radiculopathy, 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   POTS (postural  orthostatic tachycardia syndrome) 04/05/2021   Referred otalgia of right ear 07/13/2021   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    Past Surgical History:  Procedure Laterality Date   ANTERIOR CERVICAL DECOMP/DISCECTOMY FUSION N/A 05/25/2020   Procedure: ANTERIOR CERVICAL DECOMPRESSION/DISCECTOMY FUSION, INTERBODY PROSTHESIS, PLATE/SCREWS CERVICAL FIVE- CERVICAL SIX;  Surgeon: Mavis Purchase, MD;  Location[facility];  Service: Neurosurgery;  Laterality: N/A;   BACK SURGERY     CERVICAL DISC SURGERY     C6/C7 2009   cubital tunnel left arm     2003   ELBOW SURGERY     FIBULA FRACTURE SURGERY     plate & pin removed due to infection 1997   ULNAR NERVE REPAIR      Family History  Problem Relation Age of Onset   Heart disease Mother    Other Mother        clotting disorders   Diabetes Mother    High blood pressure Mother    Kidney disease Mother    Depression Mother    Sleep apnea Mother    Obesity Mother    Other Father        sudden death/cervical and lumbar disk disease   Aneurysm Father        In his 22s smoked   Hyperlipidemia Father    High blood pressure Father  Sudden death Father    Alcoholism Father    Hypertension Other    Allergies Other    Deep vein thrombosis Other    Sleep apnea Other    Obesity Other     Social History   Tobacco Use   Smoking status: Never   Smokeless tobacco: Never  Vaping Use   Vaping status: Never Used  Substance Use Topics   Alcohol use: Yes    Comment: 1 - 2 a year   Drug use: No      Current Outpatient Medications:    albuterol  (VENTOLIN  HFA) 108 (90 Base) MCG/ACT inhaler, Inhale 2 puffs into the lungs every 6 (six) hours as needed. For wheezing, Disp: 1 each, Rfl: 2   atorvastatin  (LIPITOR) 80 MG tablet, Take 1 tablet (80 mg total) by mouth daily., Disp: 90 tablet, Rfl: 1   buPROPion  (WELLBUTRIN  SR) 150 MG 12 hr tablet, Take 1 tablet (150 mg total) by mouth daily., Disp: 30 tablet, Rfl: 0   cholecalciferol   (VITAMIN D3) 25 MCG (1000 UNIT) tablet, Take 1,000 Units by mouth daily., Disp: , Rfl:    Continuous Glucose Sensor (DEXCOM G7 SENSOR) MISC, Use every 10 days, Disp: 9 each, Rfl: 4   diphenoxylate -atropine  (LOMOTIL ) 2.5-0.025 MG tablet, Take 1 tablet by mouth 4 (four) times daily as needed., Disp: , Rfl:    ELIQUIS  5 MG TABS tablet, TAKE 1 TABLET TWICE A DAY, Disp: 180 tablet, Rfl: 2   empagliflozin  (JARDIANCE ) 25 MG TABS tablet, TAKE 1 TABLET BY MOUTH EVERY DAY BEFORE BREAKFAST, Disp: 90 tablet, Rfl: 3   escitalopram  (LEXAPRO ) 20 MG tablet, Take 1 tablet (20 mg total) by mouth daily., Disp: 30 tablet, Rfl: 0   fludrocortisone (FLORINEF) 0.1 MG tablet, Take 0.1 mg by mouth 2 (two) times daily., Disp: , Rfl:    insulin  aspart (NOVOLOG ) 100 UNIT/ML injection, Use up to 80 units daily via pump., Disp: 80 mL, Rfl: 3   Insulin  Disposable Pump (V-GO 30) 30 UNIT/24HR KIT, USE AND DISCARD 1 V-GO     DEVICE DAILY, Disp: 90 kit, Rfl: 3   Insulin  Pen Needle 32G X 4 MM MISC, Use 2x a day, Disp: 200 each, Rfl: 3   metFORMIN  (GLUCOPHAGE -XR) 500 MG 24 hr tablet, TAKE 4 TABLETS DAILY WITH BREAKFAST, Disp: 360 tablet, Rfl: 3   NOVOLOG  FLEXPEN 100 UNIT/ML FlexPen, Inject 3-5 Units into the skin 2 (two) times daily before a meal., Disp: 15 mL, Rfl: 11   SODIUM CHLORIDE  PO, Take 4 g by mouth., Disp: , Rfl:    tirzepatide  (MOUNJARO ) 12.5 MG/0.5ML Pen, Inject 12.5 mg into the skin once a week., Disp: 6 mL, Rfl: 3  EXAM: BP Readings from Last 3 Encounters:  11/19/23 104/68  10/22/23 101/68  10/15/23 120/70    VITALS per patient if applicable:  GENERAL: alert, oriented, appears well and in no acute distress  HEENT: atraumatic, conjunttiva clear, no obvious abnormalities on inspection of external nose and ears  NECK: normal movements of the head and neck  LUNGS: on inspection no signs of respiratory distress, breathing rate appears normal, no obvious gross SOB, gasping or wheezing  CV: no obvious  cyanosis  MS: moves all visible extremities left hand fine tremor no obvious  thenal hypothenar atrophy  nl color   PSYCH/NEURO: pleasant and cooperative, no obvious depression or anxiety, speech and thought processing grossly intact Lab Results  Component Value Date   WBC 7.7 08/06/2023   HGB 15.0 08/06/2023   HCT  45.0 08/06/2023   PLT 223.0 08/06/2023   GLUCOSE 96 08/06/2023   CHOL 160 02/05/2023   TRIG 210.0 (H) 02/05/2023   HDL 39.40 02/05/2023   LDLDIRECT 125.0 12/26/2018   LDLCALC 78 02/05/2023   ALT 26 02/05/2023   AST 19 02/05/2023   NA 139 08/06/2023   K 4.1 08/06/2023   CL 105 08/06/2023   CREATININE 0.72 08/06/2023   BUN 14 08/06/2023   CO2 25 08/06/2023   TSH 0.91 08/06/2023   PSA 0.39 02/05/2023   HGBA1C 6.6 (A) 10/15/2023   MICROALBUR 0.6 07/10/2023    ASSESSMENT AND PLAN:  Discussed the following assessment and plan:    ICD-10-CM   1. Tremor  R25.1 Ambulatory referral to Neurology    2. Numbness of left hand  R20.0 Ambulatory referral to Neurology    3. Type 2 diabetes mellitus with complication, with long-term current use of insulin  ([name])  E11.8    Z79.4    controlled     Uncertain cause  ? Atypical cts other  doesn't explain the tremor  Dm in control currently  . Replace a neuro referral    discussed   Counseled.   Expectant management and discussion of plan and treatment with opportunity to ask questions and all were answered. The patient agreed with the plan and demonstrated an understanding of the instructions.   Advised to call back or seek an in-person evaluation if worsening  or having  further concerns  in interim. Return if symptoms worsen in interim, for when planned.   Apolinar Eastern, MD

## 2023-12-11 ENCOUNTER — Encounter: Payer: Self-pay | Admitting: Neurology

## 2023-12-11 DIAGNOSIS — E113313 Type 2 diabetes mellitus with moderate nonproliferative diabetic retinopathy with macular edema, bilateral: Secondary | ICD-10-CM | POA: Diagnosis not present

## 2023-12-16 ENCOUNTER — Ambulatory Visit (INDEPENDENT_AMBULATORY_CARE_PROVIDER_SITE_OTHER): Admitting: Nurse Practitioner

## 2023-12-16 ENCOUNTER — Encounter (INDEPENDENT_AMBULATORY_CARE_PROVIDER_SITE_OTHER): Payer: Self-pay | Admitting: Nurse Practitioner

## 2023-12-16 VITALS — BP 100/64 | HR 89 | Temp 97.8°F | Ht 70.0 in | Wt 275.0 lb

## 2023-12-16 DIAGNOSIS — E782 Mixed hyperlipidemia: Secondary | ICD-10-CM

## 2023-12-16 DIAGNOSIS — E1169 Type 2 diabetes mellitus with other specified complication: Secondary | ICD-10-CM | POA: Diagnosis not present

## 2023-12-16 DIAGNOSIS — E559 Vitamin D deficiency, unspecified: Secondary | ICD-10-CM | POA: Diagnosis not present

## 2023-12-16 DIAGNOSIS — E66812 Obesity, class 2: Secondary | ICD-10-CM

## 2023-12-16 DIAGNOSIS — E118 Type 2 diabetes mellitus with unspecified complications: Secondary | ICD-10-CM | POA: Diagnosis not present

## 2023-12-16 DIAGNOSIS — F5089 Other specified eating disorder: Secondary | ICD-10-CM

## 2023-12-16 DIAGNOSIS — Z794 Long term (current) use of insulin: Secondary | ICD-10-CM | POA: Diagnosis not present

## 2023-12-16 DIAGNOSIS — Z7984 Long term (current) use of oral hypoglycemic drugs: Secondary | ICD-10-CM

## 2023-12-16 DIAGNOSIS — F3289 Other specified depressive episodes: Secondary | ICD-10-CM

## 2023-12-16 DIAGNOSIS — Z7985 Long-term (current) use of injectable non-insulin antidiabetic drugs: Secondary | ICD-10-CM

## 2023-12-16 DIAGNOSIS — Z6839 Body mass index (BMI) 39.0-39.9, adult: Secondary | ICD-10-CM

## 2023-12-16 MED ORDER — ESCITALOPRAM OXALATE 20 MG PO TABS: 20.0000 mg | ORAL_TABLET | Freq: Every day | ORAL | 1 refills | Status: DC

## 2023-12-16 MED ORDER — BUPROPION HCL ER (SR) 150 MG PO TB12: 150.0000 mg | ORAL_TABLET | Freq: Every day | ORAL | 1 refills | Status: DC

## 2023-12-16 NOTE — Progress Notes (Signed)
 Office: [phone]  /  Fax: [phone]  WEIGHT SUMMARY AND BIOMETRICS  Weight Lost Since Last Visit: 6 lb  Weight Gained Since Last Visit: 0   Vitals Temp: 97.8 F (36.6 C) BP: 100/64 Pulse Rate: 89 SpO2: 98 %   Anthropometric Measurements Height: 5' 10 (1.778 m) Weight: 275 lb (124.7 kg) BMI (Calculated): 39.46 Weight at Last Visit: 281 lb Weight Lost Since Last Visit: 6 lb Weight Gained Since Last Visit: 0 Starting Weight: 293 lb Total Weight Loss (lbs): 18 lb (8.165 kg) Peak Weight: 405 lb   Body Composition  Body Fat %: 38.7 % Fat Mass (lbs): 106.6 lbs Muscle Mass (lbs): 160.6 lbs Total Body Water (lbs): 123 lbs Visceral Fat Rating : 23   Other Clinical Data Fasting: yes Labs: no Today's Visit #: 50 Starting Date: 09/07/19    Total Weight Loss: 18 pounds Percent of body weight lost: 6.1%   Bio Impedance Data reviewed with patient: Down 4 pounds of muscle and down 2 pounds of adipose.  HPI  Chief Complaint: OBESITY  Troy [name] is here to discuss his progress with his obesity treatment plan. He is on the the Category 3 Plan and states he is following his eating plan approximately 90 % of the time. He states he is exercising 20 minutes 1 or 2 days per week.   Interval History:  Since last office visit he is doing well with his meal plan and is hitting his protein goal of 100 grams of protein. He does need to remind himself to get lunch on Mounjaro .  He is drinking 240 ounces of water or unsweet drinks daily. Lunch can be the meal he skips, eats with wife breakfast and dinner.  Blood sugars are running 120's, 123 today.  He is currently on Mounjaro  12.5 mg once a week.   He has gotten worsening of tremors in both hands and was referred to neurology but his appointment is January 6th. Dysautonomia specialist sees patient every 6 months. He can normally sense when he is going to fall and can sit down or lean against something but will still occasionally  fall unexpectedly.  Wellbutrin  and Lexapro  do help somewhat with the emotional eating.  Medication does control anxiety and anger.     PHYSICAL EXAM:  Blood pressure 100/64, pulse 89, temperature 97.8 F (36.6 C), height 5' 10 (1.778 m), weight 275 lb (124.7 kg), SpO2 98%. Body mass index is 39.46 kg/m.  General: Well Developed, well nourished, and in no acute distress.  HEENT: Normocephalic, atraumatic; EOMI, sclerae are anicteric. Skin: Warm and dry, good turgor Chest:  Normal excursion, shape, no gross ABN Respiratory: No conversational dyspnea; speaking in full sentences NeuroM-Sk:  Normal gross ROM * 4 extremities  Psych: A and O X 3, insight adequate, mood- full    DIAGNOSTIC DATA REVIEWED:  BMET    Component Value Date/Time   NA 139 08/06/2023 1015   NA 142 10/03/2022 0750   K 4.1 08/06/2023 1015   CL 105 08/06/2023 1015   CO2 25 08/06/2023 1015   GLUCOSE 96 08/06/2023 1015   BUN 14 08/06/2023 1015   BUN 13 10/03/2022 0750   CREATININE 0.72 08/06/2023 1015   CREATININE 0.79 12/26/2018 1052   CALCIUM  9.0 08/06/2023 1015   GFRNONAA >60 10/31/2020 1506   GFRNONAA 106 12/26/2018 1052   GFRAA 125 12/21/2019 1433   GFRAA 123 12/26/2018 1052   Lab Results  Component Value Date   HGBA1C 6.6 (A) 10/15/2023   HGBA1C  7.3 (H) 05/19/2007   Lab Results  Component Value Date   INSULIN  7.9 09/06/2020   Lab Results  Component Value Date   TSH 0.91 08/06/2023   CBC    Component Value Date/Time   WBC 7.7 08/06/2023 1015   RBC 4.72 08/06/2023 1015   HGB 15.0 08/06/2023 1015   HCT 45.0 08/06/2023 1015   PLT 223.0 08/06/2023 1015   MCV 95.3 08/06/2023 1015   MCH 32.7 10/31/2020 1506   MCHC 33.2 08/06/2023 1015   RDW 13.6 08/06/2023 1015   Iron Studies    Component Value Date/Time   IRON 144 02/08/2020 1102   TIBC 358 02/08/2020 1102   FERRITIN 96 02/08/2020 1102   IRONPCTSAT 40 02/08/2020 1102   Lipid Panel     Component Value Date/Time   CHOL 160  02/05/2023 1030   CHOL 208 (H) 10/03/2022 0750   TRIG 210.0 (H) 02/05/2023 1030   HDL 39.40 02/05/2023 1030   HDL 34 (L) 10/03/2022 0750   CHOLHDL 4 02/05/2023 1030   VLDL 42.0 (H) 02/05/2023 1030   LDLCALC 78 02/05/2023 1030   LDLCALC 134 (H) 10/03/2022 0750   LDLCALC 74 02/08/2020 1102   LDLDIRECT 125.0 12/26/2018 1052   Hepatic Function Panel     Component Value Date/Time   PROT 7.4 02/05/2023 1030   PROT 6.8 10/03/2022 0750   ALBUMIN 4.6 02/05/2023 1030   ALBUMIN 4.3 10/03/2022 0750   AST 19 02/05/2023 1030   ALT 26 02/05/2023 1030   ALKPHOS 70 02/05/2023 1030   BILITOT 0.8 02/05/2023 1030   BILITOT 0.4 10/03/2022 0750   BILIDIR 0.2 01/31/2022 1042      Component Value Date/Time   TSH 0.91 08/06/2023 1015   Nutritional Lab Results  Component Value Date   VD25OH 32.8 10/03/2022   VD25OH 70.2 03/23/2021   VD25OH 55.3 09/06/2020     ASSESSMENT AND PLAN  Class 2 severe obesity with serious comorbidity and body mass index (BMI) of 39.0 to 39.9 in adult, unspecified obesity type TREATMENT PLAN FOR OBESITY:  Recommended Dietary Goals  Troy [name] is currently in the action stage of change. As such, his goal is to continue weight management plan. He has agreed to the Category 3 Plan.  Behavioral Intervention  We discussed the following Behavioral Modification Strategies today: avoiding skipping meals, continue to work on maintaining a reduced calorie state, getting the recommended amount of protein, incorporating whole foods, making healthy choices, staying well hydrated and practicing mindfulness when eating., and increase protein intake, fibrous foods (25 grams per day for women, 30 grams for men) and water to improve satiety and decrease hunger signals. Discussed setting alarm for 6 hours after breakfast to remind self to eat lunch   Recommended Physical Activity Goals  Troy [name] has been advised to work up to 150 minutes of moderate intensity aerobic activity a week  and strengthening exercises 2-3 times per week for cardiovascular health, weight loss maintenance and preservation of muscle mass.   He has agreed to Think about enjoyable ways to increase daily physical activity and overcoming barriers to exercise and Increase physical activity in their day and reduce sedentary time (increase NEAT). Discuss possible chair exercises and light weights for arms   Pharmacotherapy We discussed various medication options to help Yvon with his weight loss efforts and we both agreed to continue Lexapro  and Buproprion for emotional eating.  ASSOCIATED CONDITIONS ADDRESSED TODAY  Action/Plan  Type 2 diabetes mellitus with complication, with long-term current use of insulin  (  [name]) Continue Category 3  meal plan, limit simple carbohydrates Decreasing body weight by 10-15% can improve glucose levels Work on mild chair exercises and strengthening exercises of arms  Continue Mounjaro  12.5 mg SQ QW, Jardiance  25 mg every day and Novolog  insulin  pump and Novolog  flex pen 2-5 units bid, MetforminXR 500 mg 4 tabs at breakfast.  Continue to follow with Dr. Trixie -     Basic metabolic panel with GFR -     Insulin , random -     Vitamin B12  DM type 2 with diabetic mixed hyperlipidemia ([name]) Continue category 3 meal plan, limit saturated fats Continue Atorvastatin  80 mg every day  Loss of 10-15% body weight can improve lipid levels Work on mild chair exercises and strengthening exercises of arms  -     Basic metabolic panel with GFR -     Insulin , random  Emotional Eating Behavior Continue Lexapro  and Wellbutrin  as directed -     buPROPion  HCl ER (SR); Take 1 tablet (150 mg total) by mouth daily.  Dispense: 30 tablet; Refill: 1 -     Escitalopram  Oxalate; Take 1 tablet (20 mg total) by mouth daily.  Dispense: 30 tablet; Refill: 1   Vitamin D  deficiency Continue Cholecalciferol  1000 units daily and sill adjust medication pending results -     VITAMIN D  25 Hydroxy  (Vit-D Deficiency, Fractures)         Return in about 5 weeks (around 01/20/2024).SABRA He was informed of the importance of frequent follow up visits to maximize his success with intensive lifestyle modifications for his multiple health conditions.   ATTESTASTION STATEMENTS:  Reviewed by clinician on day of visit: allergies, medications, problem list, medical history, surgical history, family history, social history, and previous encounter notes.   I personally spent a total of 50 minutes in the care of the patient today including preparing to see the patient, getting/reviewing separately obtained history, performing a medically appropriate exam/evaluation, counseling and educating, placing orders, documenting clinical information in the EHR, independently interpreting results, communicating results, and coordinating care.   Nansi Birmingham ANP-C

## 2023-12-17 ENCOUNTER — Ambulatory Visit (INDEPENDENT_AMBULATORY_CARE_PROVIDER_SITE_OTHER): Payer: Self-pay | Admitting: Nurse Practitioner

## 2023-12-17 LAB — BASIC METABOLIC PANEL WITH GFR
BUN/Creatinine Ratio: 18 (ref 9–20)
BUN: 15 mg/dL (ref 6–24)
CO2: 22 mmol/L (ref 20–29)
Calcium: 9.5 mg/dL (ref 8.7–10.2)
Chloride: 104 mmol/L (ref 96–106)
Creatinine, Ser: 0.82 mg/dL (ref 0.76–1.27)
Glucose: 86 mg/dL (ref 70–99)
Potassium: 4.4 mmol/L (ref 3.5–5.2)
Sodium: 143 mmol/L (ref 134–144)
eGFR: 105 mL/min/1.73 (ref >59)

## 2023-12-17 LAB — VITAMIN B12: Vitamin B-12: 453 pg/mL (ref 232–1245)

## 2023-12-17 LAB — INSULIN, RANDOM: INSULIN: 5.8 u[IU]/mL (ref 2.6–24.9)

## 2023-12-17 LAB — VITAMIN D 25 HYDROXY (VIT D DEFICIENCY, FRACTURES): Vit D, 25-Hydroxy: 31.9 ng/mL (ref 30.0–100.0)

## 2023-12-28 ENCOUNTER — Encounter: Payer: Self-pay | Admitting: Internal Medicine

## 2023-12-30 MED ORDER — V-GO 30 30 UNIT/24HR KIT: 1.0000 | PACK | Freq: Every day | 3 refills | Status: DC

## 2024-01-09 ENCOUNTER — Encounter: Payer: Self-pay | Admitting: Internal Medicine

## 2024-01-20 ENCOUNTER — Encounter (INDEPENDENT_AMBULATORY_CARE_PROVIDER_SITE_OTHER): Payer: Self-pay | Admitting: Family Medicine

## 2024-01-20 ENCOUNTER — Ambulatory Visit (INDEPENDENT_AMBULATORY_CARE_PROVIDER_SITE_OTHER): Payer: Self-pay | Admitting: Family Medicine

## 2024-01-20 VITALS — BP 111/72 | HR 93 | Temp 97.6°F | Ht 70.0 in | Wt 271.0 lb

## 2024-01-20 DIAGNOSIS — Z6838 Body mass index (BMI) 38.0-38.9, adult: Secondary | ICD-10-CM

## 2024-01-20 DIAGNOSIS — E669 Obesity, unspecified: Secondary | ICD-10-CM

## 2024-01-20 DIAGNOSIS — F5089 Other specified eating disorder: Secondary | ICD-10-CM | POA: Diagnosis not present

## 2024-01-20 DIAGNOSIS — E782 Mixed hyperlipidemia: Secondary | ICD-10-CM | POA: Diagnosis not present

## 2024-01-20 DIAGNOSIS — Z794 Long term (current) use of insulin: Secondary | ICD-10-CM

## 2024-01-20 DIAGNOSIS — E118 Type 2 diabetes mellitus with unspecified complications: Secondary | ICD-10-CM

## 2024-01-20 DIAGNOSIS — E119 Type 2 diabetes mellitus without complications: Secondary | ICD-10-CM | POA: Diagnosis not present

## 2024-01-20 DIAGNOSIS — Z7985 Long-term (current) use of injectable non-insulin antidiabetic drugs: Secondary | ICD-10-CM

## 2024-01-20 DIAGNOSIS — F32A Depression, unspecified: Secondary | ICD-10-CM | POA: Diagnosis not present

## 2024-01-20 DIAGNOSIS — F3289 Other specified depressive episodes: Secondary | ICD-10-CM

## 2024-01-20 MED ORDER — BUPROPION HCL ER (SR) 150 MG PO TB12: 150.0000 mg | ORAL_TABLET | Freq: Every day | ORAL | 1 refills | Status: AC

## 2024-01-20 MED ORDER — ESCITALOPRAM OXALATE 20 MG PO TABS: 20.0000 mg | ORAL_TABLET | Freq: Every day | ORAL | 1 refills | Status: DC

## 2024-01-20 MED ORDER — ATORVASTATIN CALCIUM 80 MG PO TABS: 80.0000 mg | ORAL_TABLET | Freq: Every day | ORAL | 1 refills | Status: AC

## 2024-01-20 NOTE — Progress Notes (Signed)
 Office: [phone]  /  Fax: [phone]  WEIGHT SUMMARY AND BIOMETRICS  Anthropometric Measurements Height: 5' 10 (1.778 m) Weight: 271 lb (122.9 kg) BMI (Calculated): 38.88 Weight at Last Visit: 275 lb Weight Lost Since Last Visit: 4 lb Weight Gained Since Last Visit: 0 Starting Weight: 293 lb Total Weight Loss (lbs): 22 lb (9.979 kg) Peak Weight: 405 lb   Body Composition  Body Fat %: 37.9 % Fat Mass (lbs): 102.8 lbs Muscle Mass (lbs): 160.4 lbs Total Body Water (lbs): 117.2 lbs Visceral Fat Rating : 22   Other Clinical Data Fasting: yes Labs: no Today's Visit #: 51 Starting Date: 09/07/19    Chief Complaint: OBESITY    History of Present Illness Troy [name] is a 53 year old male with obesity and hyperlipidemia who presents for a follow-up on his obesity treatment plan and progress assessment.  He adheres to a category three eating plan approximately ninety percent of the time and exercises three days a week for fifteen to thirty minutes, focusing on arm exercises. He is working on consuming the recommended amount of protein, hydrating adequately, and increasing his intake of fruits, vegetables, and fats. He continues to skip meals and struggles to consistently achieve seven to nine hours of sleep per night. He has lost four pounds in the last month.  He has hyperlipidemia and is working on diet, exercise, and weight loss to lower his cholesterol levels. He is currently taking Lipitor 80 mg and requests a refill.  He is on Lexapro  20 mg and Wellbutrin  SR 150 mg for emotional eating behaviors and reports stability on these medications, requesting refills for both.  He experiences constant thumb twitching, which worsens at rest and affects activities like typing. His hands also go numb easily. He has a neurology appointment scheduled for January 10th to address these symptoms.  Socially, he has been house-sitting and caring for two dogs, which has  increased his physical activity. He mentions occasional binge eating, particularly ice cream, when house-sitting, but avoids keeping such temptations at home. He is on a strict budget while awaiting disability approval and engages in activities like playing games that offer small monetary rewards to help with finances.      PHYSICAL EXAM:  Blood pressure 111/72, pulse 93, temperature 97.6 F (36.4 C), height 5' 10 (1.778 m), weight 271 lb (122.9 kg), SpO2 98%. Body mass index is 38.88 kg/m.  DIAGNOSTIC DATA REVIEWED:  BMET    Component Value Date/Time   NA 143 12/16/2023 1011   K 4.4 12/16/2023 1011   CL 104 12/16/2023 1011   CO2 22 12/16/2023 1011   GLUCOSE 86 12/16/2023 1011   GLUCOSE 96 08/06/2023 1015   BUN 15 12/16/2023 1011   CREATININE 0.82 12/16/2023 1011   CREATININE 0.79 12/26/2018 1052   CALCIUM  9.5 12/16/2023 1011   GFRNONAA >60 10/31/2020 1506   GFRNONAA 106 12/26/2018 1052   GFRAA 125 12/21/2019 1433   GFRAA 123 12/26/2018 1052   Lab Results  Component Value Date   HGBA1C 6.6 (A) 10/15/2023   HGBA1C 7.3 (H) 05/19/2007   Lab Results  Component Value Date   INSULIN  5.8 12/16/2023   INSULIN  7.9 09/06/2020   Lab Results  Component Value Date   TSH 0.91 08/06/2023   CBC    Component Value Date/Time   WBC 7.7 08/06/2023 1015   RBC 4.72 08/06/2023 1015   HGB 15.0 08/06/2023 1015   HCT 45.0 08/06/2023 1015   PLT 223.0 08/06/2023  1015   MCV 95.3 08/06/2023 1015   MCH 32.7 10/31/2020 1506   MCHC 33.2 08/06/2023 1015   RDW 13.6 08/06/2023 1015   Iron Studies    Component Value Date/Time   IRON 144 02/08/2020 1102   TIBC 358 02/08/2020 1102   FERRITIN 96 02/08/2020 1102   IRONPCTSAT 40 02/08/2020 1102   Lipid Panel     Component Value Date/Time   CHOL 160 02/05/2023 1030   CHOL 208 (H) 10/03/2022 0750   TRIG 210.0 (H) 02/05/2023 1030   HDL 39.40 02/05/2023 1030   HDL 34 (L) 10/03/2022 0750   CHOLHDL 4 02/05/2023 1030   VLDL 42.0 (H)  02/05/2023 1030   LDLCALC 78 02/05/2023 1030   LDLCALC 134 (H) 10/03/2022 0750   LDLCALC 74 02/08/2020 1102   LDLDIRECT 125.0 12/26/2018 1052   Hepatic Function Panel     Component Value Date/Time   PROT 7.4 02/05/2023 1030   PROT 6.8 10/03/2022 0750   ALBUMIN 4.6 02/05/2023 1030   ALBUMIN 4.3 10/03/2022 0750   AST 19 02/05/2023 1030   ALT 26 02/05/2023 1030   ALKPHOS 70 02/05/2023 1030   BILITOT 0.8 02/05/2023 1030   BILITOT 0.4 10/03/2022 0750   BILIDIR 0.2 01/31/2022 1042      Component Value Date/Time   TSH 0.91 08/06/2023 1015   Nutritional Lab Results  Component Value Date   VD25OH 31.9 12/16/2023   VD25OH 32.8 10/03/2022   VD25OH 70.2 03/23/2021     Assessment and Plan Assessment & Plan Obesity Management is ongoing with a focus on dietary adherence and exercise. He follows the category three eating plan 90% of the time, exercises three days a week, and has lost four pounds in the last month. Challenges include skipping meals and insufficient sleep, which may hinder weight loss efforts. Emotional eating is managed with Lexapro  and Wellbutrin . - Continue category three eating plan - Encouraged regular exercise - Address meal skipping and improve sleep hygiene - Continue Lexapro  and Wellbutrin  for emotional eating  Mixed hyperlipidemia Managed with dietary modifications, exercise, and Lipitor 80 mg. He is working on lowering cholesterol through lifestyle changes. - Refilled Lipitor 80 mg - Continue dietary modifications and exercise  Depression and emotional eating behaviors Managed with Lexapro  and Wellbutrin . He reports stability on these medications and requests refills. - Refilled Lexapro  20 mg - Refilled Wellbutrin  SR 150 mg  Type 2 diabetes mellitus without complications Type 2 diabetes is managed with Mounjaro  and Dexcom for glucose monitoring. He is transitioning from Dexcom to Omnipod due to insurance changes. He has one refill left for Dexcom and is  preparing to switch to Omnipod. - Refilled Dexcom - Transition to Omnipod for glucose monitoring - Ensure adequate supply of Mounjaro    Troy [name] was counseled on the importance of maintaining healthy lifestyle habits, including balanced nutrition, regular physical activity, and behavioral modifications, while taking antiobesity medication.  Patient verbalized understanding that medication is an adjunct to, not a replacement for, lifestyle changes and that the long-term success and weight maintenance depend on continued adherence to these strategies.   Annie was informed of the importance of frequent follow up visits to maximize his success with intensive lifestyle modifications for his obesity and obesity related health conditions as recommended by USPSTF and CMS guidelines   Louann Penton, MD

## 2024-01-21 ENCOUNTER — Telehealth: Payer: Self-pay | Admitting: Nutrition

## 2024-01-22 ENCOUNTER — Other Ambulatory Visit: Payer: Self-pay | Admitting: Internal Medicine

## 2024-01-22 ENCOUNTER — Encounter: Attending: Internal Medicine | Admitting: Nutrition

## 2024-01-22 DIAGNOSIS — E118 Type 2 diabetes mellitus with unspecified complications: Secondary | ICD-10-CM | POA: Diagnosis not present

## 2024-01-22 DIAGNOSIS — Z794 Long term (current) use of insulin: Secondary | ICD-10-CM | POA: Insufficient documentation

## 2024-01-22 MED ORDER — OMNIPOD 5 G7 INTRO (GEN 5) KIT: 1.0000 | PACK | Freq: Once | 0 refills | Status: AC

## 2024-01-22 MED ORDER — OMNIPOD 5 G7 PODS (GEN 5) MISC: 1.0000 | 3 refills | Status: DC

## 2024-01-22 NOTE — Patient Instructions (Signed)
 Call when you OmniPod prescription is picked up from the pharmacy to schedule a training appointment

## 2024-01-22 NOTE — Progress Notes (Signed)
 Patient is here today because his insurance will no longer cover his V-Go 30.  He was shown other insulin  delivery devices:  CeQur with a once daily injection of long acting insulin , OmniPod, and beta bionic and tandem pump therapy.  He chose the OmniPod due to it's ease of use as well as his insurance coverage.  He was given 2 weeks of V-Go 30 samples, since he was wearing his last V-Go 30.  Lot #: [id number],  exp. Date: 6/26.   He will call when his prescription is picked up from the pharmacy for a training appointment.  He had no final questions.

## 2024-01-23 DIAGNOSIS — M216X1 Other acquired deformities of right foot: Secondary | ICD-10-CM | POA: Diagnosis not present

## 2024-01-23 DIAGNOSIS — M216X2 Other acquired deformities of left foot: Secondary | ICD-10-CM | POA: Diagnosis not present

## 2024-02-03 ENCOUNTER — Encounter: Payer: Self-pay | Admitting: Internal Medicine

## 2024-02-04 ENCOUNTER — Other Ambulatory Visit (HOSPITAL_COMMUNITY): Payer: Self-pay

## 2024-02-04 ENCOUNTER — Telehealth: Payer: Self-pay

## 2024-02-04 NOTE — Telephone Encounter (Signed)
 Pharmacy Patient Advocate Encounter   Received notification from Pt Calls Messages that prior authorization for Omnipod is required/requested.   Insurance verification completed.   The patient is insured through [facility].   Per test claim: PA required; PA submitted to above mentioned insurance via Latent Key/confirmation #/EOC [id number] Status is pending

## 2024-02-04 NOTE — Telephone Encounter (Signed)
 Pt needs a PA for OmniPod

## 2024-02-11 ENCOUNTER — Ambulatory Visit: Payer: BC Managed Care – PPO | Admitting: Internal Medicine

## 2024-02-11 VITALS — BP 94/60 | HR 91 | Temp 97.6°F | Ht 70.51 in | Wt 278.4 lb

## 2024-02-11 DIAGNOSIS — Z79899 Other long term (current) drug therapy: Secondary | ICD-10-CM

## 2024-02-11 DIAGNOSIS — Z125 Encounter for screening for malignant neoplasm of prostate: Secondary | ICD-10-CM

## 2024-02-11 DIAGNOSIS — Z0000 Encounter for general adult medical examination without abnormal findings: Secondary | ICD-10-CM | POA: Diagnosis not present

## 2024-02-11 DIAGNOSIS — R251 Tremor, unspecified: Secondary | ICD-10-CM

## 2024-02-11 DIAGNOSIS — E1169 Type 2 diabetes mellitus with other specified complication: Secondary | ICD-10-CM | POA: Diagnosis not present

## 2024-02-11 DIAGNOSIS — Z23 Encounter for immunization: Secondary | ICD-10-CM

## 2024-02-11 DIAGNOSIS — E782 Mixed hyperlipidemia: Secondary | ICD-10-CM

## 2024-02-11 DIAGNOSIS — E785 Hyperlipidemia, unspecified: Secondary | ICD-10-CM

## 2024-02-11 DIAGNOSIS — G90A Postural orthostatic tachycardia syndrome (POTS): Secondary | ICD-10-CM

## 2024-02-11 DIAGNOSIS — I825Y1 Chronic embolism and thrombosis of unspecified deep veins of right proximal lower extremity: Secondary | ICD-10-CM

## 2024-02-11 DIAGNOSIS — Z7901 Long term (current) use of anticoagulants: Secondary | ICD-10-CM

## 2024-02-11 LAB — LIPID PANEL
Cholesterol: 122 mg/dL (ref 0–200)
HDL: 35.9 mg/dL — ABNORMAL LOW (ref >39.00)
LDL Cholesterol: 59 mg/dL (ref 0–99)
NonHDL: 86.42
Total CHOL/HDL Ratio: 3
Triglycerides: 138 mg/dL (ref 0.0–149.0)
VLDL: 27.6 mg/dL (ref 0.0–40.0)

## 2024-02-11 LAB — HEPATIC FUNCTION PANEL
ALT: 42 U/L (ref 0–53)
AST: 23 U/L (ref 0–37)
Albumin: 4.4 g/dL (ref 3.5–5.2)
Alkaline Phosphatase: 77 U/L (ref 39–117)
Bilirubin, Direct: 0.2 mg/dL (ref 0.0–0.3)
Total Bilirubin: 1.1 mg/dL (ref 0.2–1.2)
Total Protein: 7 g/dL (ref 6.0–8.3)

## 2024-02-11 LAB — PSA: PSA: 0.28 ng/mL (ref 0.10–4.00)

## 2024-02-11 MED ORDER — APIXABAN 2.5 MG PO TABS: 2.5000 mg | ORAL_TABLET | Freq: Two times a day (BID) | ORAL | 2 refills | Status: AC

## 2024-02-11 MED ORDER — APIXABAN 2.5 MG PO TABS: 2.5000 mg | ORAL_TABLET | Freq: Two times a day (BID) | ORAL | 2 refills | Status: DC

## 2024-02-11 MED ORDER — APIXABAN 5 MG PO TABS: 5.0000 mg | ORAL_TABLET | Freq: Two times a day (BID) | ORAL | 3 refills | Status: DC

## 2024-02-11 NOTE — Progress Notes (Signed)
 Chief Complaint  Patient presents with   Annual Exam    HPI: Patient  Troy [name]  53 y.o. comes in today for Preventive Health Care visit  Has multiple medical conditions  the most difficult is the dysautonomia nd hypotension that makes hinm inability to work  go out w/o with risk of falling.  Anticoagulation for chronic  dvt  on eliquis  no  bleeding DM: endo on insulin  mounjaro  metformin  and sglt2 on pump  CV hypotension dysautonomia  Obesity  has lost weight in past 6 mos  HLD Neuro  last seen for tremor esp thumb left hand to see neuro soon  Weight  mamagment  doing well  Can get down because of financial  stress  and wife had rx breast cancer    Health Maintenance  Topic Date Due   Colonoscopy  04/15/2023   COVID-19 Vaccine (5 - 2025-26 season) 02/27/2024 (Originally 11/11/2023)   HEMOGLOBIN A1C  04/16/2024   Diabetic kidney evaluation - Urine ACR  07/09/2024   FOOT EXAM  09/02/2024   OPHTHALMOLOGY EXAM  12/03/2024   Diabetic kidney evaluation - eGFR measurement  12/15/2024   DTaP/Tdap/Td (4 - Tdap) 07/23/2027   Pneumococcal Vaccine: 50+ Years  Completed   Influenza Vaccine  Completed   Hepatitis B Vaccines 19-59 Average Risk  Completed   Hepatitis C Screening  Completed   HIV Screening  Completed   Zoster Vaccines- Shingrix   Completed   HPV VACCINES  Aged Out   Meningococcal B Vaccine  Aged Out   Health Maintenance Review LIFESTYLE:  Exercise:   trying chair yoga  Tobacco/ETS: n Alcohol:  N Sugar beverages:   n Sleep:   5 - 15  Drug use: no HH of 3 son and wife   pets  Work: out of work since last  January  applying for disability  denied once cannot go  far .  Without assistance at risk  doesn't go out without help incase falls out dizziness     ROS:  GEN/ HEENT: No fever, significant weight changes sweats headaches vision problems hearing changes, CV/ PULM; No chest pain shortness of breath cough, syncope,edema  change in exercise tolerance. GI /GU:  No adominal pain, vomiting, change in bowel habits. No blood in the stool. No significant GU symptoms. SKIN/HEME: ,no acute skin rashes suspicious lesions or bleeding. No lymphadenopathy, nodules, masses.  NEURO/ PSYCH:  No neurologic signs such as weakness numbness. No depression anxiety. IMM/ Allergy: No unusual infections.  Allergy .   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   Diabetes insipidus    Diabetes mellitus without complication ([name])    TYPE II   DVT (deep venous thrombosis) ([name])    right leg last time 2020   Dysautonomia ([name]) 07/16/2022   Leg edema, right    Male infertility    Neck injury 12/22/2013   Neuropathy    Obesity    Obesity  OSA (obstructive sleep apnea)    CPAP   Osteoarthritis of spine with radiculopathy, 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   POTS (postural orthostatic tachycardia syndrome) 04/05/2021   Referred otalgia of right ear 07/13/2021   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    Past Surgical History:  Procedure Laterality Date   ANTERIOR CERVICAL DECOMP/DISCECTOMY FUSION N/A 05/25/2020   Procedure: ANTERIOR CERVICAL DECOMPRESSION/DISCECTOMY FUSION, INTERBODY PROSTHESIS, PLATE/SCREWS CERVICAL FIVE- CERVICAL SIX;  Surgeon: Mavis Purchase, MD;  Location: [facility] OR;  Service: Neurosurgery;  Laterality: N/A;   BACK SURGERY     CERVICAL DISC SURGERY     C6/C7 2009   cubital tunnel left arm     2003   ELBOW SURGERY     FIBULA FRACTURE SURGERY     plate & pin removed due to infection 1997   ULNAR NERVE  REPAIR      Family History  Problem Relation Age of Onset   Heart disease Mother    Other Mother        clotting disorders   Diabetes Mother    High blood pressure Mother    Kidney disease Mother    Depression Mother    Sleep apnea Mother    Obesity Mother    Other Father        sudden death/cervical and lumbar disk disease   Aneurysm Father        In his 60s smoked   Hyperlipidemia Father    High blood pressure Father    Sudden death Father    Alcoholism Father    Hypertension Other    Allergies Other    Deep vein thrombosis Other    Sleep apnea Other    Obesity Other     Social History   Socioeconomic History   Marital status: Married    Spouse name: Nathanel   Number of children: 1   Years of education: Not on file   Highest education level: Bachelor's degree (e.g., BA, AB, BS)  Occupational History   Occupation: Customer Service Lead  Tobacco Use   Smoking status: Never   Smokeless tobacco: Never  Vaping Use   Vaping status: Never Used  Substance and Sexual Activity   Alcohol use: Yes    Comment: 1 - 2 a year   Drug use: No   Sexual activity: Yes  Other Topics Concern   Not on file  Social History Narrative   Occupation:  [employer]   Worked 11- 8 am  Now changed job and shift to [employer] through Thursday    Working  Ft 40 hours mostly   Married 10 10 10    Wife s/p bariatric surgery pt   Regular exercise- yes   Pt does have children   Father died suddenly 2008-05-04   Mom passes 2017  Acute rep disease after acute renal failure   Daily caffeine use one a day   2 dogs and cat.       Right Handed             Social Drivers of Health   Financial Resource Strain: High Risk (02/11/2024)   Overall Financial Resource Strain (CARDIA)    Difficulty of Paying Living Expenses: Hard  Food Insecurity: Food Insecurity Present (02/11/2024)   Hunger Vital Sign    Worried About Running Out of Food in the Last Year: Sometimes true    Ran Out of Food in the  Last Year: Never true  Transportation Needs: No Transportation Needs (02/11/2024)   PRAPARE -  Administrator, Civil Service (Medical): No    Lack of Transportation (Non-Medical): No  Physical Activity: Inactive (02/11/2024)   Exercise Vital Sign    Days of Exercise per Week: 0 days    Minutes of Exercise per Session: Not on file  Stress: Stress Concern Present (02/11/2024)   [employer] of Occupational Health - Occupational Stress Questionnaire    Feeling of Stress: To some extent  Social Connections: Moderately Isolated (02/11/2024)   Social Connection and Isolation Panel    Frequency of Communication with Friends and Family: Twice a week    Frequency of Social Gatherings with Friends and Family: Once a week    Attends Religious Services: Never    Database Administrator or Organizations: No    Attends Engineer, Structural: Not on file    Marital Status: Married    Outpatient Medications Prior to Visit  Medication Sig Dispense Refill   albuterol  (VENTOLIN  HFA) 108 (90 Base) MCG/ACT inhaler Inhale 2 puffs into the lungs every 6 (six) hours as needed. For wheezing 1 each 2   atorvastatin  (LIPITOR) 80 MG tablet Take 1 tablet (80 mg total) by mouth daily. 90 tablet 1   buPROPion  (WELLBUTRIN  SR) 150 MG 12 hr tablet Take 1 tablet (150 mg total) by mouth daily. 30 tablet 1   cholecalciferol  (VITAMIN D3) 25 MCG (1000 UNIT) tablet Take 1,000 Units by mouth daily.     Continuous Glucose Sensor (DEXCOM G7 SENSOR) MISC Use every 10 days 9 each 4   diphenoxylate -atropine  (LOMOTIL ) 2.5-0.025 MG tablet Take 1 tablet by mouth 4 (four) times daily as needed.     empagliflozin  (JARDIANCE ) 25 MG TABS tablet TAKE 1 TABLET BY MOUTH EVERY DAY BEFORE BREAKFAST 90 tablet 3   escitalopram  (LEXAPRO ) 20 MG tablet Take 1 tablet (20 mg total) by mouth daily. 30 tablet 1   insulin  aspart (NOVOLOG ) 100 UNIT/ML injection Use up to 80 units daily via pump. 80 mL 3   Insulin  Disposable Pump  (OMNIPOD 5 G7 PODS, GEN 5,) MISC 1 each by Does not apply route every 3 (three) days. 30 each 3   Insulin  Pen Needle 32G X 4 MM MISC Use 2x a day 200 each 3   metFORMIN  (GLUCOPHAGE -XR) 500 MG 24 hr tablet TAKE 4 TABLETS DAILY WITH BREAKFAST 360 tablet 3   NOVOLOG  FLEXPEN 100 UNIT/ML FlexPen Inject 3-5 Units into the skin 2 (two) times daily before a meal. 15 mL 11   SODIUM CHLORIDE  PO Take 4 g by mouth.     tirzepatide  (MOUNJARO ) 12.5 MG/0.5ML Pen Inject 12.5 mg into the skin once a week. 6 mL 3   ELIQUIS  5 MG TABS tablet TAKE 1 TABLET TWICE A DAY 180 tablet 2   No facility-administered medications prior to visit.     EXAM:  BP 94/60 (Cuff Size: Large)   Pulse 91   Temp 97.6 F (36.4 C) (Oral)   Ht 5' 10.51 (1.791 m)   Wt 278 lb 6.4 oz (126.3 kg)   SpO2 97%   BMI 39.37 kg/m   Body mass index is 39.37 kg/m. Wt Readings from Last 3 Encounters:  02/11/24 278 lb 6.4 oz (126.3 kg)  01/20/24 271 lb (122.9 kg)  12/16/23 275 lb (124.7 kg)    Physical Exam: Vital signs reviewed HZW:Uypd is a well-developed well-nourished alert cooperative    who appearsr stated age in no acute distress.  HEENT: normocephalic atraumatic , Eyes: PERRL EOM's full, conjunctiva clear,  Nares: paten,t no deformity discharge or tenderness., Ears: no deformity EAC's clear TMs with normal landmarks. Mouth: clear OP, no lesions, edema.  Moist mucous membranes. Dentition in adequate repair. NECK: dec motionwithout masses, thyromegaly or bruits. CHEST/PULM:  Clear to auscultation and percussion breath sounds equal no wheeze , rales or rhonchi.  CV: PMI is nondisplaced, S1 S2 no gallops, murmurs, rubs. Peripheral pulses are full without delay.No JVD .  ABDOMEN: Bowel sounds normal nontender  No guard or rebound, no hepato splenomegal no CVA tenderness.   Extremtities:  No clubbing cyanosis or edema, no acute joint swelling or redness no focal atrophy Atrophy r ue legs  no acuet edema ( uses compression)  NEURO:   Oriented x3, cranial nerves 3-12 appear to be intact, gait independent but cautious   fine tremor left thumb more prominent  SKIN: No acute rashes normal turgor, color, no bruising or petechiae. PSYCH: Oriented, good eye contact, no obvious depression anxiety, cognition and judgment appear normal. LN: no cervical axillary adenopathy  Lab Results  Component Value Date   WBC 7.7 08/06/2023   HGB 15.0 08/06/2023   HCT 45.0 08/06/2023   PLT 223.0 08/06/2023   GLUCOSE 86 12/16/2023   CHOL 122 02/11/2024   TRIG 138.0 02/11/2024   HDL 35.90 (L) 02/11/2024   LDLDIRECT 125.0 12/26/2018   LDLCALC 59 02/11/2024   ALT 42 02/11/2024   AST 23 02/11/2024   NA 143 12/16/2023   K 4.4 12/16/2023   CL 104 12/16/2023   CREATININE 0.82 12/16/2023   BUN 15 12/16/2023   CO2 22 12/16/2023   TSH 0.91 08/06/2023   PSA 0.28 02/11/2024   HGBA1C 6.6 (A) 10/15/2023   MICROALBUR 0.6 07/10/2023    BP Readings from Last 3 Encounters:  02/11/24 94/60  01/20/24 111/72  12/16/23 100/64    Lab plan reviewed with patient   ASSESSMENT AND PLAN:  Discussed the following assessment and plan:    ICD-10-CM   1. Visit for preventive health examination  Z00.00 Lipid panel    PSA    Hepatic function panel    2. Flu vaccine need  Z23 Flu vaccine trivalent PF, 6mos and older(Flulaval,Afluria,Fluarix,Fluzone)    3. Need for pneumococcal 20-valent conjugate vaccination  Z23 Pneumococcal conjugate vaccine 20-valent (Prevnar 20)    4. POTS (postural orthostatic tachycardia syndrome)  G90.A    biggest cause of inability to work    5. Screening PSA (prostate specific antigen)  Z12.5 PSA    6. Medication management  Z79.899     7. Hyperlipidemia, unspecified hyperlipidemia type  E78.5 Lipid panel    Hepatic function panel    8. DM type 2 with diabetic mixed hyperlipidemia ([name])  E11.69 Lipid panel   E78.2 Hepatic function panel    9. Obesity, starting BMI 40.87  E66.01     10. Tremor  R25.1    tb eval  soon    11. Chronic deep vein thrombosis (DVT) of proximal vein of right lower extremity ([name])  I82.5Y1     12. Anticoagulant long-term use  Z79.01     Trial dec anticoag dose to 2.5 bid  since cdvt stable  no active bleeding Low bp and fall at risk has been difficult  poss post viral covid or other  follow  Disc  gentle chair exercise to address muscle maintained Disc vaccine  of benefit  Return in about 1 year (around 02/10/2025) for depending on results 6-12.  Patient Care Team: Akari Crysler K, MD as  PCP - General (Internal Medicine) Michele Richardson, DO as PCP - Cardiology (Cardiology) Trixie File, MD as Consulting Physician (Internal Medicine) Patel, Donika K, DO as Consulting Physician (Neurology) Patient Instructions  Good to see you today . Prevnar 20 update  and flu vaccine  Optimize any safe muscle exercises .  Update lab today  Repeat BP was 94/60 Troy [name]. Troy [name] M.D.

## 2024-02-11 NOTE — Patient Instructions (Addendum)
 Good to see you today . Prevnar 20 update  and flu vaccine  Optimize any safe muscle exercises .  Update lab today  Repeat BP was 94/60

## 2024-02-13 ENCOUNTER — Ambulatory Visit: Payer: Self-pay | Admitting: Internal Medicine

## 2024-02-13 DIAGNOSIS — F32A Depression, unspecified: Secondary | ICD-10-CM

## 2024-02-13 NOTE — Progress Notes (Signed)
 Ldl is at goal ,liver tests normal , psa normal range

## 2024-02-16 ENCOUNTER — Other Ambulatory Visit (INDEPENDENT_AMBULATORY_CARE_PROVIDER_SITE_OTHER): Payer: Self-pay | Admitting: Nurse Practitioner

## 2024-02-16 DIAGNOSIS — F3289 Other specified depressive episodes: Secondary | ICD-10-CM

## 2024-02-17 ENCOUNTER — Encounter: Payer: Self-pay | Admitting: Internal Medicine

## 2024-02-17 NOTE — Telephone Encounter (Signed)
 Pharmacy Patient Advocate Encounter  Received notification from [facility] that Prior Authorization for Omnipod has been APPROVED from 02/04/2024 to 02/03/2027   PA #/Case ID/Reference #: [id number]

## 2024-02-18 ENCOUNTER — Ambulatory Visit: Admitting: Internal Medicine

## 2024-02-18 ENCOUNTER — Encounter: Payer: Self-pay | Admitting: Internal Medicine

## 2024-02-18 VITALS — BP 124/70 | HR 90 | Ht 70.5 in | Wt 280.6 lb

## 2024-02-18 DIAGNOSIS — E785 Hyperlipidemia, unspecified: Secondary | ICD-10-CM

## 2024-02-18 DIAGNOSIS — E11319 Type 2 diabetes mellitus with unspecified diabetic retinopathy without macular edema: Secondary | ICD-10-CM | POA: Diagnosis not present

## 2024-02-18 DIAGNOSIS — E66813 Obesity, class 3: Secondary | ICD-10-CM | POA: Diagnosis not present

## 2024-02-18 DIAGNOSIS — Z794 Long term (current) use of insulin: Secondary | ICD-10-CM

## 2024-02-18 DIAGNOSIS — Z6841 Body Mass Index (BMI) 40.0 and over, adult: Secondary | ICD-10-CM

## 2024-02-18 MED ORDER — OMNIPOD 5 G7 INTRO (GEN 5) KIT: 1.0000 | PACK | Freq: Once | 0 refills | Status: AC

## 2024-02-18 NOTE — Patient Instructions (Addendum)
 Please continue: - Metformin  XR 2000 mg at dinnertime - Jardiance  25 mg before b'fast  - VGo30 6 clicks per meal + use 8 clicks for larger meals. Add 3-5 units of NovoLog  before a large meal. - Mounjaro  12.5 mg weekly  Switch to Omnipod.  Please return in 4 months.

## 2024-02-18 NOTE — Progress Notes (Signed)
 Subjective:     Patient ID: Troy [name], male   DOB: 1971/02/17, 53 y.o.   MRN: [id number]  HPI Mr. Duffus is a pleasant 53 y.o. man, returning for management of DM2, dx 2012, insulin -dependent, with complications (DR), uncontrolled.  Last visit 4 months ago.  Interim history: He continues to work with the weight management clinic.  He lost 6 pounds since last visit. No increased urination, blurry vision (has floaters), nausea, chest pain.   His wife was diagnosed with breast cancer before last visit.  They had to cancel a cruise to [place] . He has dysautonomia - he is on Fludrocortisone and high salt diet. Blood pressure is still low. He has a history of falls.  He has a watch that can monitor him for falls.  Reviewed HbA1c levels: Lab Results  Component Value Date   HGBA1C 6.6 (A) 10/15/2023   HGBA1C 7.5 (A) 07/10/2023   HGBA1C 6.8 (A) 12/21/2022   At last visit he was on: - Metformin  XR 2000 mg in a.m. - Invokana  100 mg >> Jardiance  25 mg before breakfast - VGo 40 >> 30 with 4-6 clicks per meal, but 8 clicks before a larger meal (Novolog ) + added 3 to 5 units before a larger meal - Ozempic  2 mg weekly >> off >> Mounjaro  12.5 mg weekly He was on Mounjaro  12.5 mg weekly but not available or also not working as well as Ozempic  for him. He was on Victoza  before >> no SEs.   He checks his sugars more than 4 times a day with his CGM:  Previously:  Previously:  Lowest: 45 >> ... 68. Highest: 400 >> 300s.  -No CKD:  Lab Results  Component Value Date   BUN 15 12/16/2023   BUN 14 08/06/2023   CREATININE 0.82 12/16/2023   CREATININE 0.72 08/06/2023   No MAU: Lab Results  Component Value Date   MICRALBCREAT 7 07/10/2023   MICRALBCREAT <4 10/03/2022   MICRALBCREAT 6 02/08/2020   MICRALBCREAT 0.6 07/29/2009   MICRALBCREAT 9.9 07/16/2008  He is not on ACE inhibitor/ARB.  -+ HL: Lab Results  Component Value Date   CHOL 122 02/11/2024   HDL 35.90 (L) 02/11/2024    LDLCALC 59 02/11/2024   LDLDIRECT 125.0 12/26/2018   TRIG 138.0 02/11/2024   CHOLHDL 3 02/11/2024  On Lipitor 20 >> 40 >> 80 (increased 12/30/2022).  - Last eye exam was 12/04/2023: + DR.  He sees a retina specialist.  He started intraocular injections - for macular edema OU.  - No numbness and tingling in feet - sees podiatry.  Previously on gabapentin , then on Lyrica , then off.  Last foot exam was by Dr. Orland 09/03/2023 - Note reviewed:   He has OSA and is compliant with his CPAP. He had a DVT in R leg in 04/2015.  In 2020: He had another DVT episode (his 3rd: 2003, 2017, 2020) >> on Eliquis .  He noticed that sugars were higher after he started Eliquis . He lost 45 lbs before 2017 - mostly vegan food.  He has a history of ADD. He did see urology before and was given Viagra, but causes for ED were not explored. We checked his testosterone  level.  This returned mildly low: Component     Latest Ref Rng & Units 04/27/2019  Testosterone , Serum (Total)     ng/dL 737 (L)  % Free Testosterone      % 1.9  Free Testosterone , S     pg/mL 50 (L)  Sex Hormone  Binding Globulin     nmol/L 34.5  After the above results, I referred him to the weight management clinic. He was started on Citalopram for presumed dx. Of andropause - feels much better - his irritability resolved.  After an episode of syncope, we checked him for adrenal insufficiency -normal cosyntropin  stimulation test: Component     Latest Ref Rng & Units 11/24/2020 11/24/2020 11/24/2020         2:08 PM  2:39 PM  3:05 PM  Cortisol, Plasma     ug/dL 9.1 78.1 71.5   He had epidural steroid injections (02/2020 and 03/2020) and then cervical decompression surgery (diskectomy, fusion) on 05/25/2020. He experienced vertigo in 09/2020, before a diagnosis of COVID-19.  He then had a syncopal episode (CBG 160).  He had milder episodes afterwards - but not recently.  No orthostasis.  We checked him for adrenal insufficiency and investigation  was negative after last visit.  He developed leg weakness and had several falls.  He had extensive investigation >> negative. He was referred to cardiology in [place] - now has a diagnosis of dysautonomia (not POTS). He is on a high salt diet, medication, and compression garment.   Review of Systems: + See HPI   I reviewed pt's medications, allergies, PMH, social hx, family hx, and changes were documented in the history of present illness. Otherwise, unchanged from my initial visit note.  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   Diabetes insipidus    Diabetes mellitus without complication ([name])    TYPE II   DVT (deep venous thrombosis) ([name])    right leg last time 2020   Dysautonomia ([name]) 07/16/2022   Leg edema, right    Male infertility    Neck injury 12/22/2013   Neuropathy    Obesity    Obesity    OSA (obstructive sleep apnea)    CPAP   Osteoarthritis of spine with radiculopathy, 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   POTS (postural orthostatic tachycardia syndrome) 04/05/2021   Referred otalgia of right ear 07/13/2021   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   Past Surgical History:  Procedure Laterality Date   ANTERIOR CERVICAL DECOMP/DISCECTOMY FUSION N/A 05/25/2020   Procedure: ANTERIOR CERVICAL DECOMPRESSION/DISCECTOMY FUSION, INTERBODY PROSTHESIS, PLATE/SCREWS CERVICAL FIVE- CERVICAL SIX;  Surgeon: Mavis Purchase, MD;  Location: [facility] OR;  Service: Neurosurgery;  Laterality: N/A;   BACK SURGERY     CERVICAL DISC SURGERY     C6/C7 2009   cubital tunnel left arm     2003   ELBOW SURGERY      FIBULA FRACTURE SURGERY     plate & pin removed due to infection 1997   ULNAR NERVE REPAIR     Social History   Socioeconomic History   Marital status: Married    Spouse name: Nathanel   Number of children: 1   Years of education: Not on file   Highest education level: Bachelor's degree (e.g., BA, AB, BS)  Occupational History   Occupation: Customer Service Lead  Tobacco  Use   Smoking status: Never   Smokeless tobacco: Never  Vaping Use   Vaping status: Never Used  Substance and Sexual Activity   Alcohol use: Yes    Comment: 1 - 2 a year   Drug use: No   Sexual activity: Yes  Other Topics Concern   Not on file  Social History Narrative   Occupation:  days [employer] 11- 8 am  Now changed job and shift to [employer] through Thursday    Working  Ft 40 hours mostly   Married 10 10 10    Wife s/p bariatric surgery pt   Regular exercise- yes   Pt does have children   Father died suddenly 2008/05/27   Mom passes 2017  Acute rep disease after acute renal failure   Daily caffeine use one a day   2 dogs and cat.       Right Handed             Social Drivers of Health   Financial Resource Strain: High Risk (02/11/2024)   Overall Financial Resource Strain (CARDIA)    Difficulty of Paying Living Expenses: Hard  Food Insecurity: Food Insecurity Present (02/11/2024)   Hunger Vital Sign    Worried About Running Out of Food in the Last Year: Sometimes true    Ran Out of Food in the Last Year: Never true  Transportation Needs: No Transportation Needs (02/11/2024)   PRAPARE - Administrator, Civil Service (Medical): No    Lack of Transportation (Non-Medical): No  Physical Activity: Inactive (02/11/2024)   Exercise Vital Sign    Days of Exercise per Week: 0 days    Minutes of Exercise per Session: Not on file  Stress: Stress Concern Present (02/11/2024)   [employer] of Occupational Health - Occupational Stress Questionnaire    Feeling of Stress: To some  extent  Social Connections: Moderately Isolated (02/11/2024)   Social Connection and Isolation Panel    Frequency of Communication with Friends and Family: Twice a week    Frequency of Social Gatherings with Friends and Family: Once a week    Attends Religious Services: Never    Database Administrator or Organizations: No    Attends Engineer, Structural: Not on file    Marital Status: Married  Catering Manager Violence: Not At Risk (02/05/2023)   Humiliation, Afraid, Rape, and Kick questionnaire    Fear of Current or Ex-Partner: No    Emotionally Abused: No    Physically Abused: No    Sexually Abused: No   Current Outpatient Medications on File Prior to Visit  Medication Sig Dispense Refill   albuterol  (VENTOLIN  HFA) 108 (90 Base) MCG/ACT inhaler Inhale 2 puffs into the lungs every 6 (six) hours as needed. For wheezing 1 each 2   apixaban  (ELIQUIS ) 2.5 MG TABS tablet Take 1 tablet (2.5 mg total) by mouth 2 (two) times daily. Dosage change 180 tablet 2   atorvastatin  (LIPITOR) 80 MG tablet Take 1 tablet (80 mg total) by mouth daily. 90 tablet 1   buPROPion  (WELLBUTRIN  SR) 150 MG 12 hr tablet Take 1 tablet (150 mg total) by mouth daily. 30 tablet 1   cholecalciferol  (VITAMIN D3) 25 MCG (1000 UNIT) tablet Take 1,000 Units by mouth daily.     Continuous Glucose Sensor (DEXCOM G7 SENSOR) MISC Use every 10 days 9 each 4   diphenoxylate -atropine  (LOMOTIL ) 2.5-0.025 MG tablet Take 1 tablet by mouth 4 (four) times  daily as needed.     empagliflozin  (JARDIANCE ) 25 MG TABS tablet TAKE 1 TABLET BY MOUTH EVERY DAY BEFORE BREAKFAST 90 tablet 3   escitalopram  (LEXAPRO ) 20 MG tablet Take 1 tablet (20 mg total) by mouth daily. 30 tablet 1   insulin  aspart (NOVOLOG ) 100 UNIT/ML injection Use up to 80 units daily via pump. 80 mL 3   Insulin  Disposable Pump (OMNIPOD 5 G7 PODS, GEN 5,) MISC 1 each by Does not apply route every 3 (three) days. 30 each 3   Insulin  Pen Needle 32G X 4 MM MISC Use 2x a  day 200 each 3   metFORMIN  (GLUCOPHAGE -XR) 500 MG 24 hr tablet TAKE 4 TABLETS DAILY WITH BREAKFAST 360 tablet 3   NOVOLOG  FLEXPEN 100 UNIT/ML FlexPen Inject 3-5 Units into the skin 2 (two) times daily before a meal. 15 mL 11   SODIUM CHLORIDE  PO Take 4 g by mouth.     tirzepatide  (MOUNJARO ) 12.5 MG/0.5ML Pen Inject 12.5 mg into the skin once a week. 6 mL 3   No current facility-administered medications on file prior to visit.   No Known Allergies Family History  Problem Relation Age of Onset   Heart disease Mother    Other Mother        clotting disorders   Diabetes Mother    High blood pressure Mother    Kidney disease Mother    Depression Mother    Sleep apnea Mother    Obesity Mother    Other Father        sudden death/cervical and lumbar disk disease   Aneurysm Father        In his 2s smoked   Hyperlipidemia Father    High blood pressure Father    Sudden death Father    Alcoholism Father    Hypertension Other    Allergies Other    Deep vein thrombosis Other    Sleep apnea Other    Obesity Other     Objective:   Physical Exam BP 124/70   Pulse 90   Ht 5' 10.5 (1.791 m)   Wt 280 lb 9.6 oz (127.3 kg)   SpO2 97%   BMI 39.69 kg/m   Wt Readings from Last 10 Encounters:  02/18/24 280 lb 9.6 oz (127.3 kg)  02/11/24 278 lb 6.4 oz (126.3 kg)  01/20/24 271 lb (122.9 kg)  12/16/23 275 lb (124.7 kg)  12/10/23 278 lb (126.1 kg)  11/19/23 281 lb (127.5 kg)  10/22/23 282 lb (127.9 kg)  10/15/23 286 lb 9.6 oz (130 kg)  09/24/23 285 lb (129.3 kg)  09/03/23 285 lb (129.3 kg)   Constitutional: overweight, in NAD Eyes: EOMI, no exophthalmos ENT: no thyromegaly, no cervical lymphadenopathy Cardiovascular: Tachycardia, RR, No MRG, + RLE swelling -chronic, after his DVT, wears compression hoses Respiratory: CTA B Musculoskeletal: no deformities Skin: no rashes Neurological: + tremors with outstretched hands  Assessment:     1. DM2, insulin -dependent, uncontrolled,  with complications - DR  2. Obesity class 3  3. HL  Plan:     1. Patient with history of uncontrolled diabetes, on oral antidiabetic regimen with metformin  and SGLT2 inhibitor and also weekly GLP-1/GIP receptor agonist and previously on the V-Go patch pump but switched to the OmniPod pump,  due to insurance coverage. -At last visit, sugars were increasing throughout the day, after every meal, in a stepwise fashion.  Sugars after dinner were consistently elevated and we discussed about adding 3 to 5 units of  NovoLog  (from a pen) before a larger meal, along with a pump delivered bolus of 16 units (8 clicks).  I did recommend to try to inject NovoLog  15 minutes before the meals as he was injecting at the start of the meal. CGM interpretation: -At today's visit, we reviewed his CGM downloads: It appears that 84% of values are in target range (goal >70%), while 14% are higher than 180 (goal <25%), and 2% are lower than 70 (goal <4%).  The calculated average blood sugar is 134.  The projected HbA1c for the next 3 months (GMI) is 6.5%. -Reviewing the CGM trends, sugars appear to be much better controlled compared to last visit, after adding extra few units of NovoLog  before larger meals.  In the last 2 weeks, sugars are only high around Thanksgiving, when he also was not able to take his NovoLog  with him.  With the rest of the sugars being much improved, for now I did not suggest a change in regimen.  However, as mentioned above, he would need to switch to OmniPod due to insurance not covering VGo anymore.  We discussed that this is a more complex system, but will likely work well for him.  He will need an appointment with Linda Spagnola for diabetes education to attach this. - I advised him to: Patient Instructions  Please continue: - Metformin  XR 2000 mg at dinnertime - Jardiance  25 mg before b'fast  - VGo30 6 clicks per meal + use 8 clicks for larger meals. Add 3-5 units of NovoLog  before a large  meal. - Mounjaro  12.5 mg weekly  Switch to Omnipod.  Please return in 4 months.  - we checked his HbA1c: 5.9% (lower) - advised to check sugars at different times of the day - 4x a day, rotating check times - advised for yearly eye exams >> he is UTD - return to clinic in 4 months  2. Obesity class 3 - will continue the SGLT2 inhibitor and GLP-1/GIP receptor agonist, which should also help with weight loss - He continues to go to the weight management clinic.  Before last visit, he gained weight which he attributes to retaining fluid due to cardiology advising him to increase his sodium intake to help with his dysautonomia. - He gained 17 pounds before the last 2 visits combined - he lost 6 pounds pounds since last visit  3. HL - He was also found to have coronary artery calcifications and he sees cardiology -Latest lipid panel was reviewed from a week ago: Fractions at goal with the exception of a slightly low HDL: Lab Results  Component Value Date   CHOL 122 02/11/2024   HDL 35.90 (L) 02/11/2024   LDLCALC 59 02/11/2024   LDLDIRECT 125.0 12/26/2018   TRIG 138.0 02/11/2024   CHOLHDL 3 02/11/2024  - He is on Lipitor 80 mg daily without side effects  Lela Fendt, MD PhD [facility] Endocrinology

## 2024-02-25 ENCOUNTER — Encounter: Attending: Internal Medicine | Admitting: Nutrition

## 2024-02-25 ENCOUNTER — Other Ambulatory Visit: Payer: Self-pay | Admitting: Internal Medicine

## 2024-02-25 DIAGNOSIS — E118 Type 2 diabetes mellitus with unspecified complications: Secondary | ICD-10-CM

## 2024-02-25 DIAGNOSIS — E11319 Type 2 diabetes mellitus with unspecified diabetic retinopathy without macular edema: Secondary | ICD-10-CM

## 2024-02-25 DIAGNOSIS — Z794 Long term (current) use of insulin: Secondary | ICD-10-CM | POA: Insufficient documentation

## 2024-02-25 NOTE — Patient Instructions (Addendum)
 Bolus for all meals and snacks Do a correction dose any time blood sugar goes over 250mg ./dl Read over starter booklet, and phone app booklet Call OmniPod help line if questions or problems with the pump Call Dexcom help line if questions/problems with the sensor Call office if blood sugars dropping below 70, or remain over 250

## 2024-02-25 NOTE — Progress Notes (Signed)
 Patient was identified by name and DOB.  He is here to start his Omnipod 5 insulin  pump.  HE was previously on the V-Go 30.  Settings were put in per Dr. Ara orders:  Basal rate: 1.25u/hr, I/C: 1, ISF: 40, timing: 4 hours, target 110 with correction over 130 (Pt. Requested this), Max bolus 20u, max basal rate: 2.5u/hr.  Settings were put in by the patient.  HE will take 12u for breakfast and lunch and 16u for supper.  He is using his phone app for the pump usage.   He was linked to [place]:  and OmniPod:  user name: [email or web address]  PW: [id number].   He was shown how to fill a pod, discussed proper placement  of the pod, how to bolus and how/ to do correction doses.  He filled a pod with Novolog  insulin  and attached the pod to his left abdomen.  He started to pod at 10AM.  We reviewed all topics on the pump training checklist, including high blood sugar protocol, sick day guidelines and alerts and alarms.    He had no final questions.

## 2024-02-26 ENCOUNTER — Telehealth: Payer: Self-pay | Admitting: Nutrition

## 2024-02-26 NOTE — Telephone Encounter (Signed)
 Patient reported no difficulty giving boluses or wearing/using the pump.  Blood sugar dropped 3hr. pcS to 58.  He was told to give one less unit of insulin  for meals than when he was using the V-Go.  He agreed to do this.  He had no questions for me at this time.

## 2024-03-03 NOTE — Telephone Encounter (Signed)
 Yes please do referral to  psychiatry  sorry you are having difficult time

## 2024-03-04 NOTE — Telephone Encounter (Signed)
A referral was placed.

## 2024-03-17 ENCOUNTER — Encounter: Payer: Self-pay | Admitting: Neurology

## 2024-03-17 ENCOUNTER — Ambulatory Visit (INDEPENDENT_AMBULATORY_CARE_PROVIDER_SITE_OTHER): Admitting: Neurology

## 2024-03-17 VITALS — BP 112/71 | HR 99 | Ht 70.5 in | Wt 277.0 lb

## 2024-03-17 DIAGNOSIS — R251 Tremor, unspecified: Secondary | ICD-10-CM | POA: Diagnosis not present

## 2024-03-17 DIAGNOSIS — R202 Paresthesia of skin: Secondary | ICD-10-CM

## 2024-03-17 DIAGNOSIS — M542 Cervicalgia: Secondary | ICD-10-CM

## 2024-03-17 DIAGNOSIS — R200 Anesthesia of skin: Secondary | ICD-10-CM | POA: Diagnosis not present

## 2024-03-17 NOTE — Patient Instructions (Signed)
 Nerve testing of the left arm  Referral sent to start physical therapy

## 2024-03-17 NOTE — Progress Notes (Signed)
 [facility] Clinic Note - Initial Visit   Date: 03/17/2024   Troy [name] MRN: [id number] DOB: 22-Jul-1970   Dear Dr. Charlett:  Thank you for your kind referral of Troy [name] for consultation of tremor. Although his history is well known to you, please allow us  to reiterate it for the purpose of our medical record. The patient was accompanied to the clinic by self.    Troy [name] is a 54 y.o. right-handed male with insulin -dependent diabetes, orthostatic hypotension, hyperlipidemia, DVT, depression, and asthma presenting for evaluation of tremor.   IMPRESSION/PLAN: Assessment & Plan Tremor and hand numbness New onset tremor and numbness in both hands, more pronounced in the right hand. No new medications identified as a cause. No signs of Parkinson's disease. Differential includes familial tremor, benign essential tremor, positional tremor.  TSH and vitamin B12 is normal.  Discussed that treatment is initiated if patient is having difficulty with fine motor tasks.   Numbness in the left hand is new and warrants further investigation.  He has history of cervical surgery in 2022.   - NCS/EMG of the left hand - Start neck PT - Will consider MRI of the neck if symptoms persist or worsen.  Chronic dysautonomia with orthostatic hypotension managed with fludrocortisone and compression stockings.  - Continue current management with fludrocortisone and compression stockings. 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 Limited hand function.   Return to clinic in 4 months  ------------------------------------------------------------- History of present illness:  Discussed the use of AI scribe software for clinical note transcription with the patient, who gave  verbal consent to proceed.  History of Present Illness Troy [name] is a 54 year old male with dysautonomia who presents with worsening tremors.    He reports new and worsening tremors, particularly in his right hand, which have been present since June. He has a history of nerve damage in his right arm, with tremors starting at age 38. The tremors have worsened to the point where he is dropping items, and his thumb is going numb. The tremors are constant and more noticeable when trying to perform tasks with his right hand. He also experiences numbness in both hands, especially when holding a phone or driving.  He has a history of neck surgery in early 2022 and reports some neuropathy in both feet, for which he sees a podiatrist annually. He experiences some weakness in his feet but can feel sensations, although they are slightly dull in certain areas.  His mother had slight tremors, but she passed away eight years ago, making it difficult to recall specifics.  He has a history of dysautonomia and reports frequent falls. The falls often occur when he is getting up, leading him to fall back into a couch or bed. He is currently on fludrocortisone to manage his blood pressure. Despite the medication, he experiences 'white outs' and tunnel vision when his blood pressure is low.  She was diagnosed by a dysautonomia specialist.  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 He has residual weakness (18% strength) and numbness of the hand.    Out-side paper records, electronic medical record, and images have been reviewed where available and summarized as:  Lab  Results  Component Value Date   HGBA1C 6.6 (A) 10/15/2023   Lab Results  Component Value Date   VITAMINB12 453 12/16/2023   Lab Results  Component Value Date   TSH 0.91 08/06/2023   Lab Results  Component Value Date   ESRSEDRATE 11 02/08/2020    Past Medical  History:  Diagnosis Date   Arm weakness    right    Asthma    B12 deficiency    Back pain    Chronic deep vein thrombosis (DVT) of distal vein of right lower extremity ([name]) 04/05/2021   Chronic vertigo 04/05/2021   Complication of anesthesia    as a teenager , as they were waken up, I was shaking all over- they called it convulsions -it has never happen again   Diabetes 1.5, 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   Diabetes insipidus    Diabetes mellitus without complication ([name])    TYPE II   DVT (deep venous thrombosis) ([name])    right leg last time 2020   Dysautonomia ([name]) 07/16/2022   Leg edema, right    Male infertility    Neck injury 12/22/2013   Neuropathy    Obesity    Obesity    OSA (obstructive sleep apnea)    CPAP   Osteoarthritis of spine with radiculopathy, 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   POTS (postural orthostatic tachycardia syndrome) 04/05/2021   Referred otalgia of right ear 07/13/2021   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    Past Surgical History:  Procedure Laterality Date   ANTERIOR CERVICAL DECOMP/DISCECTOMY FUSION N/A 05/25/2020   Procedure: ANTERIOR CERVICAL DECOMPRESSION/DISCECTOMY FUSION, INTERBODY PROSTHESIS, PLATE/SCREWS CERVICAL FIVE- CERVICAL SIX;  Surgeon: Mavis Purchase, MD;  Location: [facility] OR;  Service: Neurosurgery;  Laterality: N/A;   BACK SURGERY     CERVICAL DISC SURGERY     C6/C7 2009   cubital tunnel left arm     2003   ELBOW SURGERY     FIBULA FRACTURE SURGERY     plate & pin removed due to infection 1997   ULNAR NERVE REPAIR       Medications:  Outpatient Encounter Medications as of 03/17/2024  Medication Sig   albuterol  (VENTOLIN  HFA) 108 (90 Base) MCG/ACT inhaler Inhale 2 puffs into the lungs every 6 (six) hours as needed. For wheezing   apixaban  (ELIQUIS ) 2.5 MG TABS tablet Take 1 tablet (2.5 mg total) by mouth 2 (two) times daily. Dosage change   atorvastatin  (LIPITOR) 80 MG tablet Take 1 tablet (80 mg total) by mouth  daily.   buPROPion  (WELLBUTRIN  SR) 150 MG 12 hr tablet Take 1 tablet (150 mg total) by mouth daily.   cholecalciferol  (VITAMIN D3) 25 MCG (1000 UNIT) tablet Take 1,000 Units by mouth daily.   Continuous Glucose Sensor (DEXCOM G7 SENSOR) MISC Use every 10 days   empagliflozin  (JARDIANCE ) 25 MG TABS tablet TAKE 1 TABLET BY MOUTH EVERY DAY BEFORE BREAKFAST   escitalopram  (LEXAPRO ) 20 MG tablet Take 1 tablet (20 mg total) by mouth daily.   fludrocortisone (FLORINEF) 0.1 MG tablet Take 0.1 mg by mouth daily. (Patient taking differently: Take 0.1 mg by mouth 2 (two) times daily.)   insulin  aspart (NOVOLOG ) 100 UNIT/ML injection Use up to 80 units daily via pump.   Insulin  Disposable Pump (OMNIPOD 5 G7 PODS, GEN 5,) MISC 1 each by Does not apply route every 3 (three) days.   Insulin  Pen Needle 32G X 4 MM MISC Use 2x a day   metFORMIN  (GLUCOPHAGE -XR) 500 MG  24 hr tablet TAKE 4 TABLETS DAILY WITH BREAKFAST   NOVOLOG  FLEXPEN 100 UNIT/ML FlexPen Inject 3-5 Units into the skin 2 (two) times daily before a meal.   SODIUM CHLORIDE  PO Take 4 g by mouth.   tirzepatide  (MOUNJARO ) 12.5 MG/0.5ML Pen Inject 12.5 mg into the skin once a week.   [DISCONTINUED] diphenoxylate -atropine  (LOMOTIL ) 2.5-0.025 MG tablet Take 1 tablet by mouth 4 (four) times daily as needed. (Patient not taking: Reported on 03/17/2024)   No facility-administered encounter medications on file as of 03/17/2024.    Allergies: Allergies[1]  Family History: Family History  Problem Relation Age of Onset   Heart disease Mother    Other Mother        clotting disorders   Diabetes Mother    High blood pressure Mother    Kidney disease Mother    Depression Mother    Sleep apnea Mother    Obesity Mother    Other Father        sudden death/cervical and lumbar disk disease   Aneurysm Father        In his 57s smoked   Hyperlipidemia Father    High blood pressure Father    Sudden death Father    Alcoholism Father    Hypertension Other     Allergies Other    Deep vein thrombosis Other    Sleep apnea Other    Obesity Other     Social History: Social History[2] Social History   Social History Narrative   Occupation:  [employer]   Worked 11- 8 am  Now changed job and shift to [employer] through Thursday    Working  Ft 40 hours mostly   Married 10 10 10    Wife s/p bariatric surgery pt   Regular exercise- yes   Pt does have children   Father died suddenly May 16, 2008   Mom passes 2017  Acute rep disease after acute renal failure   Daily caffeine use one a day   2 dogs and cat.          Are you right handed or left handed? Right Handed    Are you currently employed ? No   What is your current occupation? Trying to get disability    Do you live at home alone? No    Who lives with you? Wife and adult son    What type of home do you live in: 1 story or 2 story? Lives in a two story home               Vital Signs:  BP 112/71   Pulse 99   Ht 5' 10.5 (1.791 m)   Wt 277 lb (125.6 kg)   SpO2 97%   BMI 39.18 kg/m   Neurological Exam: MENTAL STATUS including orientation to time, place, person, recent and remote memory, attention span and concentration, language, and fund of knowledge is normal.  Speech is not dysarthric.  CRANIAL NERVES: II:  No visual field defects.     III-IV-VI: Pupils equal round and reactive to light.  Normal conjugate, extra-ocular eye movements in all directions of gaze.  No nystagmus.  No ptosis.   V:  Normal facial sensation.    VII:  Normal facial symmetry and movements.   VIII:  Normal hearing and vestibular function.   IX-X:  Normal palatal movement.   XI:  Normal shoulder shrug and head rotation.   XII:  Normal tongue strength and range of motion, no  deviation or fasciculation.  MOTOR:   Right hand and extensor arm atrophy (old).  There is mild, intermittent right > left thumb postural tremor.  No rest tremor.  No pronator drift.   Upper Extremity:  Right  Left  Deltoid  5/5    5/5   Biceps  5/5   5/5   Triceps  5/5   5/5   Infraspinatus 5/5  5/5  Medial pectoralis 5/5  5/5  Wrist extensors  5-/5   5/5   Wrist flexors  5-/5   5/5   Finger extensors  4/5   5/5   Finger flexors  4/5   5/5   Dorsal interossei  3/5   5/5   Abductor pollicis  4/5   5/5   Tone (Ashworth scale)  0  0   Lower Extremity:  Right  Left  Hip flexors  5/5   5/5   Hip extensors  5/5   5/5   Adductor 5/5  5/5  Abductor 5/5  5/5  Knee flexors  5/5   5/5   Knee extensors  5/5   5/5   Dorsiflexors  5/5   5/5   Plantarflexors  5/5   5/5   Toe extensors  5/5   5/5   Toe flexors  5/5   5/5   Tone (Ashworth scale)  0  0   MSRs:                                           Right        Left brachioradialis 2+  2+  biceps 2+  2+  triceps 2+  2+  patellar 2+  2+  ankle jerk 0  0  Hoffman no  no  plantar response down  down   SENSORY:  Vibration and temperature reduced mildly over the right foot, intact on the left.  Rhomberg sign is absent.   COORDINATION/GAIT: Normal finger-to- nose-finger.  Intact rapid alternating movements bilaterally.  Gait is mildly wide-based, stable, stressed and tandem gait intact.     Thank you for allowing me to participate in patient's care.  If I can answer any additional questions, I would be pleased to do so.    Sincerely,    Asriel Westrup K. Emmajo Bennette, DO     [1] No Known Allergies [2]  Social History Tobacco Use   Smoking status: Never   Smokeless tobacco: Never  Vaping Use   Vaping status: Never Used  Substance Use Topics   Alcohol use: Not Currently    Comment: 1 - 2 a year   Drug use: No   "

## 2024-03-17 NOTE — Telephone Encounter (Signed)
 Pt. Told to call me for training when his pump comes in

## 2024-03-19 ENCOUNTER — Encounter (INDEPENDENT_AMBULATORY_CARE_PROVIDER_SITE_OTHER): Payer: Self-pay | Admitting: Family Medicine

## 2024-03-19 ENCOUNTER — Telehealth (INDEPENDENT_AMBULATORY_CARE_PROVIDER_SITE_OTHER): Admitting: Family Medicine

## 2024-03-19 VITALS — BP 114/76 | HR 72 | Ht 70.0 in

## 2024-03-19 DIAGNOSIS — E119 Type 2 diabetes mellitus without complications: Secondary | ICD-10-CM | POA: Diagnosis not present

## 2024-03-19 DIAGNOSIS — Z7985 Long-term (current) use of injectable non-insulin antidiabetic drugs: Secondary | ICD-10-CM

## 2024-03-19 DIAGNOSIS — Z794 Long term (current) use of insulin: Secondary | ICD-10-CM | POA: Diagnosis not present

## 2024-03-19 DIAGNOSIS — Z7984 Long term (current) use of oral hypoglycemic drugs: Secondary | ICD-10-CM

## 2024-03-19 DIAGNOSIS — E669 Obesity, unspecified: Secondary | ICD-10-CM | POA: Diagnosis not present

## 2024-03-19 DIAGNOSIS — Z6838 Body mass index (BMI) 38.0-38.9, adult: Secondary | ICD-10-CM | POA: Diagnosis not present

## 2024-03-19 NOTE — Progress Notes (Signed)
 "  Office: [phone]  /  Fax: [phone]  WEIGHT SUMMARY AND BIOMETRICS  Anthropometric Measurements Height: 5' 10 (1.778 m) Weight: -- (Virtual Visit) Weight at Last Visit: 271 lb Starting Weight: 293 lb Peak Weight: 405 lb   No data recorded Other Clinical Data Fasting: N/A Labs: N/A Today's Visit #: 60 Starting Date: 09/07/19 Comments: RHONA VIDEO VISIT    Chief Complaint: OBESITY   Virtual Visit via A/V Note  I connected with Ozell GORMAN [place] on 03/19/2024 at  9:40 AM EST by audiovisual telehealth and verified that I am speaking with the correct person using two identifiers.  Location: Patient: home Provider: home   I discussed the limitations, risks, security and privacy concerns of performing an evaluation and management service by AV telehealth and the availability of in person appointments. I also discussed with the patient that there may be a patient responsible charge related to this service. The patient expressed understanding and agreed to proceed.   home History of Present Illness Troy [name] is a 54 year old male with obesity and type 2 diabetes who presents for obesity treatment and progress assessment.  He is adhering to a category three eating plan 90% of the time and consuming the recommended amount of protein. However, he is skipping meals and not currently engaging in physical exercise. He believes he has lost weight since his last visit, currently weighing 267 pounds, which he attributes to avoiding sweets and focusing on vegetables and meats. He does not feel deprived due to the appetite-suppressing effects of Mounjaro .  He is managing type 2 diabetes with a regimen that includes Jardiance  25 mg daily, Novolog  80 units daily via pump, Metformin  XR 500 mg four pills daily, and Mounjaro  12.5 mg weekly. His most recent hemoglobin A1c is 6.6, improved from 7.5 earlier this year. A recent blood sugar reading was 5.6, obtained via finger  prick last week.  He experiences vertigo, which has worsened in the last few days, causing falls and 'white outs' when his blood pressure drops into the 80s. He describes an incident where he had to carefully navigate stairs due to vertigo, with his son expressing concern after hearing him breathing heavily. He has been using his walker more frequently and keeps it at the bottom of the stairs for safety.  His sleep schedule is irregular, often staying awake until early morning and then sleeping until the afternoon.  He is currently taking escitalopram  and bupropion , with one refill remaining on the bupropion .      PHYSICAL EXAM:  Blood pressure 114/76, pulse 72, height 5' 10 (1.778 m), SpO2 98%. Body mass index is 39.75 kg/m.  DIAGNOSTIC DATA REVIEWED:  BMET    Component Value Date/Time   NA 143 12/16/2023 1011   K 4.4 12/16/2023 1011   CL 104 12/16/2023 1011   CO2 22 12/16/2023 1011   GLUCOSE 86 12/16/2023 1011   GLUCOSE 96 08/06/2023 1015   BUN 15 12/16/2023 1011   CREATININE 0.82 12/16/2023 1011   CREATININE 0.79 12/26/2018 1052   CALCIUM  9.5 12/16/2023 1011   GFRNONAA >60 10/31/2020 1506   GFRNONAA 106 12/26/2018 1052   GFRAA 125 12/21/2019 1433   GFRAA 123 12/26/2018 1052   Lab Results  Component Value Date   HGBA1C 6.6 (A) 10/15/2023   HGBA1C 7.3 (H) 05/19/2007   Lab Results  Component Value Date   INSULIN  5.8 12/16/2023   INSULIN  7.9 09/06/2020   Lab Results  Component Value Date  TSH 0.91 08/06/2023   CBC    Component Value Date/Time   WBC 7.7 08/06/2023 1015   RBC 4.72 08/06/2023 1015   HGB 15.0 08/06/2023 1015   HCT 45.0 08/06/2023 1015   PLT 223.0 08/06/2023 1015   MCV 95.3 08/06/2023 1015   MCH 32.7 10/31/2020 1506   MCHC 33.2 08/06/2023 1015   RDW 13.6 08/06/2023 1015   Iron Studies    Component Value Date/Time   IRON 144 02/08/2020 1102   TIBC 358 02/08/2020 1102   FERRITIN 96 02/08/2020 1102   IRONPCTSAT 40 02/08/2020 1102    Lipid Panel     Component Value Date/Time   CHOL 122 02/11/2024 1033   CHOL 208 (H) 10/03/2022 0750   TRIG 138.0 02/11/2024 1033   HDL 35.90 (L) 02/11/2024 1033   HDL 34 (L) 10/03/2022 0750   CHOLHDL 3 02/11/2024 1033   VLDL 27.6 02/11/2024 1033   LDLCALC 59 02/11/2024 1033   LDLCALC 134 (H) 10/03/2022 0750   LDLCALC 74 02/08/2020 1102   LDLDIRECT 125.0 12/26/2018 1052   Hepatic Function Panel     Component Value Date/Time   PROT 7.0 02/11/2024 1033   PROT 6.8 10/03/2022 0750   ALBUMIN 4.4 02/11/2024 1033   ALBUMIN 4.3 10/03/2022 0750   AST 23 02/11/2024 1033   ALT 42 02/11/2024 1033   ALKPHOS 77 02/11/2024 1033   BILITOT 1.1 02/11/2024 1033   BILITOT 0.4 10/03/2022 0750   BILIDIR 0.2 02/11/2024 1033      Component Value Date/Time   TSH 0.91 08/06/2023 1015   Nutritional Lab Results  Component Value Date   VD25OH 31.9 12/16/2023   VD25OH 32.8 10/03/2022   VD25OH 70.2 03/23/2021     Assessment and Plan Assessment & Plan Obesity Management is ongoing with adherence to the category three eating plan 90% of the time. He is not currently exercising but plans to start chair yoga and physical therapy. He is eating the recommended amount of protein and trying to hydrate, though he is skipping meals. His current weight is 267 lbs, attributed to avoiding sweets and focusing on vegetables and meats. Mounjaro  is helping reduce hunger, making it easier to manage his diet. - Continue category three eating plan - Start chair yoga - Begin physical therapy for hand shaking and neck pain - Encouraged regular exercise to maintain muscle mass  Type 2 diabetes mellitus Managed with Jardiance , Novolog , metformin  XR, and Mounjaro . His most recent hemoglobin A1c is 6.6, improved from 7.5 earlier this year. He reports a recent blood sugar reading of 5.6, though this was not recorded in the system. He is transitioning to an Omnipod due to insurance requirements, but there have been  issues with the pharmacy order. He is experiencing vertigo and falls, possibly related to low blood pressure episodes. - Continue Jardiance  25 mg daily - Continue Novolog  80 units daily via pump - Continue metformin  XR 500 mg four pills daily - Continue Mounjaro  12.5 mg weekly - Address pharmacy issues with Omnipod order      Patients who are on anti-obesity medications are counseled on the importance of maintaining healthy lifestyle habits, including balanced nutrition, regular physical activity, and behavioral modifications,  Medication is an adjunct to, not a replacement for, lifestyle changes and that the long-term success and weight maintenance depend on continued adherence to these strategies.   Collis was informed of the importance of frequent follow up visits to maximize his success with intensive lifestyle modifications for his obesity and obesity  related health conditions as recommended by USPSTF and CMS guidelines Louann Penton, MD    "

## 2024-03-26 ENCOUNTER — Other Ambulatory Visit: Payer: Self-pay

## 2024-03-26 ENCOUNTER — Ambulatory Visit: Attending: Neurology

## 2024-03-26 DIAGNOSIS — M542 Cervicalgia: Secondary | ICD-10-CM | POA: Insufficient documentation

## 2024-03-26 DIAGNOSIS — R251 Tremor, unspecified: Secondary | ICD-10-CM | POA: Diagnosis present

## 2024-03-26 DIAGNOSIS — M25641 Stiffness of right hand, not elsewhere classified: Secondary | ICD-10-CM | POA: Diagnosis present

## 2024-03-26 DIAGNOSIS — R278 Other lack of coordination: Secondary | ICD-10-CM | POA: Diagnosis present

## 2024-03-26 DIAGNOSIS — R202 Paresthesia of skin: Secondary | ICD-10-CM | POA: Diagnosis not present

## 2024-03-26 DIAGNOSIS — M6281 Muscle weakness (generalized): Secondary | ICD-10-CM | POA: Diagnosis present

## 2024-03-26 DIAGNOSIS — R200 Anesthesia of skin: Secondary | ICD-10-CM | POA: Insufficient documentation

## 2024-03-26 DIAGNOSIS — M25632 Stiffness of left wrist, not elsewhere classified: Secondary | ICD-10-CM | POA: Diagnosis present

## 2024-03-26 NOTE — Therapy (Signed)
 " OUTPATIENT PHYSICAL THERAPY CERVICAL EVALUATION   Patient Name: Troy [name] MRN: [id number] DOB:Feb 20, 1971, 54 y.o., male Today's Date: 03/26/2024  END OF SESSION:  PT End of Session - 03/26/24 1723     Visit Number 1    Date for Recertification  05/21/24    PT Start Time 1018    PT Stop Time 1100    PT Time Calculation (min) 42 min    Activity Tolerance Patient tolerated treatment well    Behavior During Therapy [facility] for tasks assessed/performed          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   Diabetes insipidus    Diabetes mellitus without complication ([name])    TYPE II   DVT (deep venous thrombosis) ([name])    right leg last time 2020   Dysautonomia ([name]) 07/16/2022   Leg edema, right    Male infertility    Neck injury 12/22/2013   Neuropathy    Obesity    Obesity    OSA (obstructive sleep apnea)    CPAP   Osteoarthritis of spine with radiculopathy, 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   POTS (postural orthostatic tachycardia syndrome) 04/05/2021   Referred otalgia of right ear 07/13/2021   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   Past Surgical History:  Procedure Laterality Date   ANTERIOR CERVICAL DECOMP/DISCECTOMY FUSION N/A 05/25/2020   Procedure: ANTERIOR CERVICAL DECOMPRESSION/DISCECTOMY FUSION, INTERBODY PROSTHESIS, PLATE/SCREWS CERVICAL FIVE- CERVICAL SIX;  Surgeon: Mavis Purchase, MD;  Location: [facility] OR;  Service: Neurosurgery;  Laterality: N/A;   BACK SURGERY     CERVICAL DISC SURGERY     C6/C7 2009   cubital tunnel left arm     2003   ELBOW SURGERY     FIBULA  FRACTURE SURGERY     plate & pin removed due to infection 1997   ULNAR NERVE REPAIR     Patient Active Problem List   Diagnosis Date Noted   Caregiver with fatigue 10/03/2022   Left elbow pain 09/05/2022   BMI 40.0-44.9, adult ([name]) 05/01/2022   Obesity, Beginning BMI 40.87 05/01/2022   Chest pain, typical angina 02/28/2022   Left knee pain 01/15/2022   Morbid obesity ([name]) 12/19/2021   Depression 10/31/2021   Syncope 03/23/2021   Elevated hemoglobin 01/16/2021   Orthostatic dizzinesshypotension 11/24/2020   Mandible pain 07/13/2020   Diarrhea 05/16/2020   Cervical radiculitis 04/12/2020   Constipation 03/31/2020   Cervicalgia 02/29/2020   Radiculopathy 01/25/2020   Mixed hyperlipidemia 12/22/2019   Vitamin D  deficiency 09/08/2019   Low HDL (under 40) 09/08/2019   Pancreatic insufficiency 03/28/2018   Type 2 diabetes mellitus with complication, with long-term current use of insulin  ([name]) 09/16/2017   DVT (deep venous thrombosis), right 04/28/2015   Acute deep vein thrombosis (DVT) of femoral vein of right lower extremity ([name]) 04/28/2015   Hemorrhoid 03/23/2013   Varicose vein of leg right 03/23/2013   Visit for preventive health examination 03/23/2013   Diastasis recti 12/22/2012   Tendinitis of right shoulder 09/27/2011  Right shoulder pain 09/27/2011   Allergic rhinitis 05/23/2011   Disturbance in sleep behavior 02/21/2011   Preventative health care 12/05/2010   Chronic diarrhea of unknown origin pt says not from metformin  12/05/2010   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   Hyperlipidemia 01/23/2008   Obstructive sleep apnea 12/22/2007   VARICOSE VEINS, LOWER EXTREMITIES 10/03/2007   Asthma 09/19/2007   Mild intermittent asthma 09/19/2007   PLANTAR FASCIITIS, LEFT 06/23/2007   RASH AND OTHER NONSPECIFIC SKIN ERUPTION 05/26/2007   EDEMA 05/26/2007   Class 2 severe obesity with serious comorbidity and body mass index (BMI) of 39.0 to 39.9 in adult 02/13/2007   OSA  (obstructive sleep apnea) 02/13/2007   DVT, HX OF 01/15/2007   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    PCP: Troy Howard MD  REFERRING PROVIDER: Tobie Breslow, DO  REFERRING DIAG: worsening tremor B hands and cervical radiculopathy  THERAPY DIAG:  Other lack of coordination  Tremor  Muscle weakness (generalized)  Rationale for Evaluation and Treatment: Rehabilitation  ONSET DATE: essential tremor B hands , L more marked L hand since June of 2025  SUBJECTIVE:                                                                                                                                                                                                         SUBJECTIVE STATEMENT: I really feel like my neck pain is not a concern too much, my chief concern is of weakening B hands Hand dominance: Right  PERTINENT HISTORY:  Referred to PT by neurologist, after progressive worsening of B hand tremors, now dropping things frequently, having difficulty holding utensils, etc  PAIN:  Are you having pain? Some neck pain rates lower neck pain at 1-2 not really painful B UE's  PRECAUTIONS: Fall autonomic syndrome and postural hypotension  RED FLAGS: None     WEIGHT BEARING RESTRICTIONS: No  FALLS:  Has patient fallen in last 6 months? Yes. Number of falls 2-3 x a week  LIVING ENVIRONMENT: Lives with: lives with their family Lives in: House/apartment Stairs: has indoor steps , bedrooms, bathrooms upstairs pt reports tends to slide on steps on his hips vs walking them  Has following equipment at home: None  OCCUPATION: not working for 1 1/2 yrs  PLOF: Independent with basic ADLs  PATIENT GOALS: get some better control of B hands  NEXT MD VISIT: 04/30/24  OBJECTIVE:  Note: Objective measures were completed at Evaluation unless otherwise noted.  DIAGNOSTIC FINDINGS:  PNCV and EMG ordered   PATIENT SURVEYS:  Quick DASH: 27/100  COGNITION: Overall cognitive status: Within  functional limits for tasks assessed  SENSATION: Wfl today L UE, long term numbness R UE to previous trauma  POSTURE: rounded shoulders, increased thoracic kyphosis, and flexed trunk   PALPATION: Non tender    CERVICAL ROM:   Active ROM A/PROM (deg) eval  Flexion 80  Extension 50  Right lateral flexion 50  Left lateral flexion 50  Right rotation 65  Left rotation 65   (Blank rows = not tested)  UPPER EXTREMITY MNF:hmnddob wfl UPPER EXTREMITY MMT:  MMT Right eval Left eval  Shoulder flexion    Shoulder extension    Shoulder abduction    Shoulder adduction    Shoulder extension    Shoulder internal rotation    Shoulder external rotation    Middle trapezius    Lower trapezius    Elbow flexion    Elbow extension    Wrist flexion 4   Wrist extension    Wrist ulnar deviation    Wrist radial deviation    Wrist pronation 4   Wrist supination    Grip strength 24# 28#   (Blank rows = wfl)  CERVICAL SPECIAL TESTS:  na  FUNCTIONAL TESTS:  na  TREATMENT DATE: 03/26/24: Evaluation, instructed pt in proximal stability ex with theraband and in bulk strengthening R wrist flexion and pronation, including hammer pronation/supination and hammer wrist flexion                                                                                                                               PATIENT EDUCATION:  Education details: POC, goals Person educated: Patient Education method: Explanation, Demonstration, Tactile cues, and Verbal cues Education comprehension: verbalized understanding, returned demonstration, verbal cues required, and tactile cues required  HOME EXERCISE PROGRAM: See above  ASSESSMENT:  CLINICAL IMPRESSION: Patient is a 54 y.o. male who was evaluated today by  physical therapy due to recent increase in B wrist and hand tremors, numbness tingling B hands and fingers, although today he does not report numbness and tingling, more of a loss of fine motor control  and weakness, increased tremor B hands.  He rated his pain today at a 1-2 in his neck.  Evaluation revealed some weakness R wrist flex, pronation also with increased tremor noted with resistance.  He does have postural changes with  more sloped shoulders and decreased cervical spine ROM.  We instructed him in strengthening for his posture and proximal shoulder/ trunk strength as well as R wrist flexion and pronation.  He also may benefit from OT evaluation to address adaptations that he might be able to make to accommodate for his tremor with his fine motor tasks.Of note he has postural hypotension and required CGA to walk through clinic for today;s appt as well as needed to sit an a chair to and from appt at the 25' mark due to weakness. BO was 108/58. OBJECTIVE IMPAIRMENTS: decreased coordination, decreased ROM, decreased strength,  impaired perceived functional ability, impaired UE functional use, postural dysfunction, obesity, and pain.   ACTIVITY LIMITATIONS: carrying, lifting, bending, standing, stairs, transfers, dressing, self feeding, reach over head, hygiene/grooming, and caring for others  PARTICIPATION LIMITATIONS: meal prep, cleaning, laundry, and driving  PERSONAL FACTORS: Behavior pattern, Fitness, and Time since onset of injury/illness/exacerbation are also affecting patient's functional outcome.   REHAB POTENTIAL: Fair    CLINICAL DECISION MAKING: Evolving/moderate complexity  EVALUATION COMPLEXITY: Moderate   GOALS: Goals reviewed with patient? Yes  SHORT TERM GOALS: Target date: 2 weeks, 04/09/24  I HEP  Baseline:  Goal status: INITIAL   LONG TERM GOALS: Target date: 8 weeks, 05/21/24  Improve R pronator strength to 4+ /5 and able to resist this motion without increased tremor activity Baseline:  Goal status: INITIAL  2.  Quick DASH improve from 27/100 to 18/100 Baseline:  Goal status: INITIAL  3.  Reduce numbness / tingling to 0 with additional cervical spine  assessment and techniques Baseline: reports no numbness/ tingling today Goal status: INITIAL     PLAN:  PT FREQUENCY: 1x/week  PT DURATION: 8 weeks  PLANNED INTERVENTIONS: 97110-Therapeutic exercises, 97530- Therapeutic activity, 97112- Neuromuscular re-education, 97535- Self Care, [id number]- Manual therapy, and Patient/Family education  PLAN FOR NEXT SESSION: continue with B UE strengthening, postural muscle strengthening,assess cervical spine distraction, neural glides   Agnes Brightbill L Jessy Cybulski, PT, DPT, OCS 03/26/2024, 5:32 PM      "

## 2024-03-31 ENCOUNTER — Ambulatory Visit

## 2024-03-31 DIAGNOSIS — R278 Other lack of coordination: Secondary | ICD-10-CM

## 2024-03-31 DIAGNOSIS — R251 Tremor, unspecified: Secondary | ICD-10-CM

## 2024-03-31 DIAGNOSIS — M6281 Muscle weakness (generalized): Secondary | ICD-10-CM

## 2024-03-31 DIAGNOSIS — M25641 Stiffness of right hand, not elsewhere classified: Secondary | ICD-10-CM

## 2024-03-31 DIAGNOSIS — M25632 Stiffness of left wrist, not elsewhere classified: Secondary | ICD-10-CM

## 2024-03-31 NOTE — Therapy (Addendum)
 " OUTPATIENT PHYSICAL THERAPY CERVICAL TREATMENT   Patient Name: Troy [name] MRN: [id number] DOB:[birth year], 54 y.o., male Today's Date: 03/31/2024  END OF SESSION:  PT End of Session - 03/31/24 0001     Visit Number 2    Date for Recertification  05/21/24    PT Start Time 0845    PT Stop Time 0930    PT Time Calculation (min) 45 min    Activity Tolerance Patient tolerated treatment well    Behavior During Therapy [facility] for tasks assessed/performed           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   Diabetes insipidus    Diabetes mellitus without complication ([name])    TYPE II   DVT (deep venous thrombosis) ([name])    right leg last time 2020   Dysautonomia ([name]) 07/16/2022   Leg edema, right    Male infertility    Neck injury 12/22/2013   Neuropathy    Obesity    Obesity    OSA (obstructive sleep apnea)    CPAP   Osteoarthritis of spine with radiculopathy, 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   POTS (postural orthostatic tachycardia syndrome) 04/05/2021   Referred otalgia of right ear 07/13/2021   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   Past Surgical History:  Procedure Laterality Date   ANTERIOR CERVICAL DECOMP/DISCECTOMY FUSION N/A 05/25/2020   Procedure: ANTERIOR CERVICAL DECOMPRESSION/DISCECTOMY FUSION, INTERBODY PROSTHESIS, PLATE/SCREWS CERVICAL FIVE- CERVICAL SIX;  Surgeon: Mavis Purchase, MD;  Location: [facility] OR;  Service: Neurosurgery;  Laterality: N/A;   BACK SURGERY     CERVICAL DISC SURGERY     C6/C7 2009   cubital tunnel left arm     2003   ELBOW SURGERY     FIBULA  FRACTURE SURGERY     plate & pin removed due to infection 1997   ULNAR NERVE REPAIR     Patient Active Problem List   Diagnosis Date Noted   Caregiver with fatigue 10/03/2022   Left elbow pain 09/05/2022   BMI 40.0-44.9, adult ([name]) 05/01/2022   Obesity, Beginning BMI 40.87 05/01/2022   Chest pain, typical angina 02/28/2022   Left knee pain 01/15/2022   Morbid obesity ([name]) 12/19/2021   Depression 10/31/2021   Syncope 03/23/2021   Elevated hemoglobin 01/16/2021   Orthostatic dizzinesshypotension 11/24/2020   Mandible pain 07/13/2020   Diarrhea 05/16/2020   Cervical radiculitis 04/12/2020   Constipation 03/31/2020   Cervicalgia 02/29/2020   Radiculopathy 01/25/2020   Mixed hyperlipidemia 12/22/2019   Vitamin D  deficiency 09/08/2019   Low HDL (under 40) 09/08/2019   Pancreatic insufficiency 03/28/2018   Type 2 diabetes mellitus with complication, with long-term current use of insulin  ([name]) 09/16/2017   DVT (deep venous thrombosis), right 04/28/2015   Acute deep vein thrombosis (DVT) of femoral vein of right lower extremity ([name]) 04/28/2015   Hemorrhoid 03/23/2013   Varicose vein of leg right 03/23/2013   Visit for preventive health examination 03/23/2013   Diastasis recti 12/22/2012   Tendinitis of right shoulder 09/27/2011  Right shoulder pain 09/27/2011   Allergic rhinitis 05/23/2011   Disturbance in sleep behavior 02/21/2011   Preventative health care 12/05/2010   Chronic diarrhea of unknown origin pt says not from metformin  12/05/2010   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   Hyperlipidemia 01/23/2008   Obstructive sleep apnea 12/22/2007   VARICOSE VEINS, LOWER EXTREMITIES 10/03/2007   Asthma 09/19/2007   Mild intermittent asthma 09/19/2007   PLANTAR FASCIITIS, LEFT 06/23/2007   RASH AND OTHER NONSPECIFIC SKIN ERUPTION 05/26/2007   EDEMA 05/26/2007   Class 2 severe obesity with serious comorbidity and body mass index (BMI) of 39.0 to 39.9 in adult 02/13/2007   OSA  (obstructive sleep apnea) 02/13/2007   DVT, HX OF 01/15/2007   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    PCP: Charlett Howard MD  REFERRING PROVIDER: Tobie Breslow, DO  REFERRING DIAG: worsening tremor B hands and cervical radiculopathy  THERAPY DIAG:  Tremor  Muscle weakness (generalized)  Stiffness of left wrist, not elsewhere classified  Stiffness of right hand, not elsewhere classified  Other lack of coordination  Rationale for Evaluation and Treatment: Rehabilitation  ONSET DATE: essential tremor B hands , L more marked L hand since June of 2025  SUBJECTIVE:                                                                                                                                                                                                         SUBJECTIVE STATEMENT: Feeling better today. Pt states he is having neck pain 1/10 on NPS. Pt stated he had a forward fall when sitting on the toilet 2 days ago. Pt stated he has been doing HEP and the exercises are good.   EVAL:I really feel like my neck pain is not a concern too much, my chief concern is of weakening B hands Hand dominance: Right  PERTINENT HISTORY:  Referred to PT by neurologist, after progressive worsening of B hand tremors, now dropping things frequently, having difficulty holding utensils, etc  PAIN:  Are you having pain? Some neck pain rates lower neck pain at 1-2 not really painful B UE's  PRECAUTIONS: Fall autonomic syndrome and postural hypotension  RED FLAGS: None     WEIGHT BEARING RESTRICTIONS: No  FALLS:  Has patient fallen in last 6 months? Yes. Number of falls 2-3 x a week  LIVING ENVIRONMENT: Lives with: lives with their family Lives in: House/apartment Stairs: has indoor steps , bedrooms, bathrooms upstairs pt reports tends to slide on steps on his hips vs walking them  Has following equipment at home: None  OCCUPATION: not working for 1 1/2 yrs  PLOF: Independent with basic  ADLs  PATIENT GOALS: get some better control of B hands  NEXT MD VISIT: 04/30/24  OBJECTIVE:  Note: Objective measures were completed at Evaluation unless otherwise noted.  DIAGNOSTIC FINDINGS:  PNCV and EMG ordered   PATIENT SURVEYS:  Quick DASH: 27/100  COGNITION: Overall cognitive status: Within functional limits for tasks assessed  SENSATION: Wfl today L UE, long term numbness R UE to previous trauma  POSTURE: rounded shoulders, increased thoracic kyphosis, and flexed trunk   PALPATION: Non tender    CERVICAL ROM:   Active ROM A/PROM (deg) eval  Flexion 80  Extension 50  Right lateral flexion 50  Left lateral flexion 50  Right rotation 65  Left rotation 65   (Blank rows = not tested)  UPPER EXTREMITY MNF:hmnddob wfl UPPER EXTREMITY MMT:  MMT Right eval Left eval  Shoulder flexion    Shoulder extension    Shoulder abduction    Shoulder adduction    Shoulder extension    Shoulder internal rotation    Shoulder external rotation    Middle trapezius    Lower trapezius    Elbow flexion    Elbow extension    Wrist flexion 4   Wrist extension    Wrist ulnar deviation    Wrist radial deviation    Wrist pronation 4   Wrist supination    Grip strength 24# 28#   (Blank rows = wfl)  CERVICAL SPECIAL TESTS:  na  FUNCTIONAL TESTS:  na  TREATMENT DATE:  03/31/24 Cervical distraction. PT did not noticed a change in sx with repeated distraction.  ULTT1 median n. glides 10 reps on R and L arm then reassessed tremor with shoulder flex/abd. PT noticed a decreased with L and not as noticeable change on R arm.   Seated pronation with velcro hammer 2x10 Seated rows with blue thera band to fatigue   03/26/24: Evaluation, instructed pt in proximal stability ex with theraband and in bulk strengthening R wrist flexion and pronation, including hammer pronation/supination and hammer wrist flexion                                                                                                                                PATIENT EDUCATION:  Education details: POC, goals Person educated: Patient Education method: Explanation, Demonstration, Tactile cues, and Verbal cues Education comprehension: verbalized understanding, returned demonstration, verbal cues required, and tactile cues required  HOME EXERCISE PROGRAM: [id number]  ASSESSMENT:  CLINICAL IMPRESSION:  Garrel attended physical therapy  today for bilateral wrist and hand tremors. No reported N/T today and no elicitation of neural Sx with additional cervical spine testing. PT performed ULTT1 for median nerve to observed a change in tremor. Pt and PT noticed a visible change with the L arm but not on R arm. He reported neck pain today 1/10 with no other sx reported. PT instructed pt to  continue strengthening and utilizing there ex to work on improving tremors in HEP. Of note pt was moving through the clinic better since initial eval and was able to walk to treatment room without any assistance. PT advised pt he will benefit from OT evaluation to address adaptations that he might be able to make to accommodate for his tremor with his fine motor tasks.    Patient is a 54 y.o. male who was evaluated today by  physical therapy due to recent increase in B wrist and hand tremors, numbness tingling B hands and fingers, although today he does not report numbness and tingling, more of a loss of fine motor control and weakness, increased tremor B hands.  He rated his pain today at a 1-2 in his neck.  Evaluation revealed some weakness R wrist flex, pronation also with increased tremor noted with resistance.  He does have postural changes with  more sloped shoulders and decreased cervical spine ROM.  We instructed him in strengthening for his posture and proximal shoulder/ trunk strength as well as R wrist flexion and pronation.  He also may benefit from OT evaluation to address adaptations that he might be able to make  to accommodate for his tremor with his fine motor tasks.Of note he has postural hypotension and required CGA to walk through clinic for today;s appt as well as needed to sit an a chair to and from appt at the 25' mark due to weakness. BO was 108/58. OBJECTIVE IMPAIRMENTS: decreased coordination, decreased ROM, decreased strength, impaired perceived functional ability, impaired UE functional use, postural dysfunction, obesity, and pain.   ACTIVITY LIMITATIONS: carrying, lifting, bending, standing, stairs, transfers, dressing, self feeding, reach over head, hygiene/grooming, and caring for others  PARTICIPATION LIMITATIONS: meal prep, cleaning, laundry, and driving  PERSONAL FACTORS: Behavior pattern, Fitness, and Time since onset of injury/illness/exacerbation are also affecting patient's functional outcome.   REHAB POTENTIAL: Fair    CLINICAL DECISION MAKING: Evolving/moderate complexity  EVALUATION COMPLEXITY: Moderate   GOALS: Goals reviewed with patient? Yes  SHORT TERM GOALS: Target date: 2 weeks, 04/09/24  I HEP  Baseline:  Goal status: INITIAL   LONG TERM GOALS: Target date: 8 weeks, 05/21/24  Improve R pronator strength to 4+ /5 and able to resist this motion without increased tremor activity Baseline:  Goal status: INITIAL  2.  Quick DASH improve from 27/100 to 18/100 Baseline:  Goal status: INITIAL  3.  Reduce numbness / tingling to 0 with additional cervical spine assessment and techniques Baseline: reports no numbness/ tingling today Goal status: INITIAL     PLAN:  PT FREQUENCY: 1x/week  PT DURATION: 8 weeks  PLANNED INTERVENTIONS: 97110-Therapeutic exercises, 97530- Therapeutic activity, 97112- Neuromuscular re-education, 97535- Self Care, [id number]- Manual therapy, and Patient/Family education  PLAN FOR NEXT SESSION: continue with B UE strengthening, postural muscle strengthening, neural glides   Nijayuana Charidy Cappelletti SPT 03/31/2024, 2:52 PM      "

## 2024-04-01 ENCOUNTER — Other Ambulatory Visit: Payer: Self-pay

## 2024-04-01 DIAGNOSIS — R251 Tremor, unspecified: Secondary | ICD-10-CM

## 2024-04-01 DIAGNOSIS — M542 Cervicalgia: Secondary | ICD-10-CM

## 2024-04-01 DIAGNOSIS — R200 Anesthesia of skin: Secondary | ICD-10-CM

## 2024-04-05 ENCOUNTER — Other Ambulatory Visit (INDEPENDENT_AMBULATORY_CARE_PROVIDER_SITE_OTHER): Payer: Self-pay | Admitting: Physician Assistant

## 2024-04-05 DIAGNOSIS — F3289 Other specified depressive episodes: Secondary | ICD-10-CM

## 2024-04-07 ENCOUNTER — Ambulatory Visit

## 2024-04-07 DIAGNOSIS — M6281 Muscle weakness (generalized): Secondary | ICD-10-CM

## 2024-04-07 DIAGNOSIS — R278 Other lack of coordination: Secondary | ICD-10-CM | POA: Diagnosis not present

## 2024-04-07 DIAGNOSIS — R251 Tremor, unspecified: Secondary | ICD-10-CM

## 2024-04-07 DIAGNOSIS — M25641 Stiffness of right hand, not elsewhere classified: Secondary | ICD-10-CM

## 2024-04-07 DIAGNOSIS — M25632 Stiffness of left wrist, not elsewhere classified: Secondary | ICD-10-CM

## 2024-04-07 NOTE — Therapy (Signed)
 " OUTPATIENT OCCUPATIONAL THERAPY ORTHO EVALUATION  Patient Name: Troy [name] MRN: [id number] DOB:[birth year], 54 y.o., male Today's Date: 04/09/2024  PCP: Dr. Charlett REFERRING PROVIDER: Dr. Tobie  END OF SESSION:  OT End of Session - 04/09/24 1238     Visit Number 1    Number of Visits 13    Date for Recertification  07/02/24    Authorization Type [employer]    Authorization Time Period 12 weeks    OT Start Time 1022    OT Stop Time 1100    OT Time Calculation (min) 38 min    Behavior During Therapy WFL for tasks assessed/performed          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   Diabetes insipidus    Diabetes mellitus without complication ([name])    TYPE II   DVT (deep venous thrombosis) ([name])    right leg last time 2020   Dysautonomia ([name]) 07/16/2022   Leg edema, right    Male infertility    Neck injury 12/22/2013   Neuropathy    Obesity    Obesity    OSA (obstructive sleep apnea)    CPAP   Osteoarthritis of spine with radiculopathy, 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   POTS (postural orthostatic tachycardia syndrome) 04/05/2021   Referred otalgia of right ear 07/13/2021   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   Past Surgical History:  Procedure Laterality Date   ANTERIOR CERVICAL DECOMP/DISCECTOMY FUSION N/A 05/25/2020   Procedure: ANTERIOR CERVICAL DECOMPRESSION/DISCECTOMY FUSION, INTERBODY PROSTHESIS, PLATE/SCREWS CERVICAL FIVE- CERVICAL SIX;  Surgeon: Mavis Purchase, MD;  Location: [facility] OR;  Service: Neurosurgery;  Laterality: N/A;   BACK SURGERY     CERVICAL DISC SURGERY      C6/C7 2009   cubital tunnel left arm     2003   ELBOW SURGERY     FIBULA FRACTURE SURGERY     plate & pin removed due to infection 1997   ULNAR NERVE REPAIR     Patient Active Problem List   Diagnosis Date Noted   Caregiver with fatigue 10/03/2022   Left elbow pain 09/05/2022   BMI 40.0-44.9, adult ([name]) 05/01/2022   Obesity, Beginning BMI 40.87 05/01/2022   Chest pain, typical angina 02/28/2022   Left knee pain 01/15/2022   Morbid obesity ([name]) 12/19/2021   Depression 10/31/2021   Syncope 03/23/2021   Elevated hemoglobin 01/16/2021   Orthostatic dizzinesshypotension 11/24/2020   Mandible pain 07/13/2020   Diarrhea 05/16/2020   Cervical radiculitis 04/12/2020   Constipation 03/31/2020   Cervicalgia 02/29/2020   Radiculopathy 01/25/2020   Mixed hyperlipidemia 12/22/2019   Vitamin D  deficiency 09/08/2019   Low HDL (under 40) 09/08/2019   Pancreatic insufficiency 03/28/2018   Type 2 diabetes mellitus with complication, with long-term current use of insulin  ([name]) 09/16/2017   DVT (deep venous thrombosis), right 04/28/2015   Acute deep vein thrombosis (DVT) of femoral vein of right lower extremity ([name]) 04/28/2015   Hemorrhoid 03/23/2013   Varicose vein of leg right 03/23/2013   Visit  for preventive health examination 03/23/2013   Diastasis recti 12/22/2012   Tendinitis of right shoulder 09/27/2011   Right shoulder pain 09/27/2011   Allergic rhinitis 05/23/2011   Disturbance in sleep behavior 02/21/2011   Preventative health care 12/05/2010   Chronic diarrhea of unknown origin pt says not from metformin  12/05/2010   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   Hyperlipidemia 01/23/2008   Obstructive sleep apnea 12/22/2007   VARICOSE VEINS, LOWER EXTREMITIES 10/03/2007   Asthma 09/19/2007   Mild intermittent asthma 09/19/2007   PLANTAR FASCIITIS, LEFT 06/23/2007   RASH AND OTHER NONSPECIFIC SKIN ERUPTION 05/26/2007   EDEMA 05/26/2007   Class 2 severe obesity with serious  comorbidity and body mass index (BMI) of 39.0 to 39.9 in adult 02/13/2007   OSA (obstructive sleep apnea) 02/13/2007   DVT, HX OF 01/15/2007   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    ONSET DATE: 04/01/24  REFERRING DIAG:  M54.2 (ICD-10-CM) - Cervicalgia  R25.1 (ICD-10-CM) - Tremor  R20.0,R20.2 (ICD-10-CM) - Numbness and tingling of both upper extremities    THERAPY DIAG:  Other lack of coordination - Plan: Ot plan of care cert/re-cert  Muscle weakness (generalized) - Plan: Ot plan of care cert/re-cert  Stiffness of right hand, not elsewhere classified - Plan: Ot plan of care cert/re-cert  Tremor - Plan: Ot plan of care cert/re-cert  Rationale for Evaluation and Treatment: Rehabilitation  SUBJECTIVE:   SUBJECTIVE STATEMENT: Pt reports dropping things at home Pt accompanied by: self  PERTINENT HISTORY: 54 y.o. right-handed male with insulin -dependent diabetes, orthostatic hypotension, hyperlipidemia, DVT, depression, and asthma He has severed the right ulnar and median nerve at the upper arm and armpit after falling onto a glass wall. He has residual weakness  and numbness of the hand, hx of tendon transfers  Per Dr. Tobie: Tremor and hand numbness New onset tremor and numbness in both hands, more pronounced in the right hand. No new medications identified as a cause. No signs of Parkinson's disease. Differential includes familial tremor, benign essential tremor, positional tremor.  TSH and vitamin B12 is normal.  Numbness in the left hand is new and warrants further investigation.  He has history of cervical surgery in 2022.   Chronic dysautonomia with orthostatic hypotension managed with fludrocortisone and compression stockings.  - Continue current management with fludrocortisone and compression stockings. 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 Limited hand function.  PRECAUTIONS: Fall and Other: low BP, stay close when pt stands        PAIN:  Are you having pain? Yes: NPRS scale: 1-2/10 Pain location: elbows, and tailbone Pain description: aching Aggravating factors: fall Relieving factors: unknown  FALLS: Has patient fallen in last 6 months? Yes. Number of falls 2-3x week due to BP issues  LIVING ENVIRONMENT: Lives with: lives with their spouse Lives in: House/apartment Stairs: yes PLOF: Independent  PATIENT GOALS: decrease drops  NEXT MD VISIT: unkown  OBJECTIVE:  Note: Objective measures were completed at Evaluation unless otherwise noted.  HAND DOMINANCE: Ambidextrous  ADLs: Pt falls 2-3x week due to low BP  mod I with basic ADLS Eating: Pt reports increased spills and difficulty cutting food  FUNCTIONAL OUTCOME MEASURES: Quick Dash: 35% disabilty  UPPER EXTREMITY ROM:   grossly WFLS with exception of R hand, pt is unable to oppose 5th digit  with thumb       HAND FUNCTION: Grip strength: Right: 80 lbs; Left: 72 lbs, Lateral pinch: Right: 12 lbs, Left: 12 lbs, 3 point pinch: Right: 14 lbs, Left: 14 lbs, and Tip pinch: Right 6 lbs, Left: 10 lbs  COORDINATION: 9 hole peg test RUE 36.32 LUE 30.36 secs   SENSATION: impaired for ring and small finger of R hand ulnar distributions    COGNITION: Overall cognitive status: Within functional limits for tasks assessed  OBSERVATIONS: Pleasant gentleman motivated to improve   TREATMENT DATE: 04/09/24- eval                                                                                                                                PATIENT EDUCATION: Education details: role of OT, potential goals Person educated: Patient Education method: Explanation, Demonstration, and Verbal cues Education comprehension: verbalized understanding  HOME EXERCISE PROGRAM: n/a  GOALS: Goals  reviewed with patient? Yes  SHORT TERM GOALS: Target date: 05/09/24  I with HEP Baseline: Goal status: INITIAL  2.  I with AE/ adapted strategies to maximize pt's safety and I with ADLs/IADLs.  Goal status: INITIAL  3.  Pt will demonstrate improved ease with feeding and cutting food with AE prn  Goal status: INITIAL  4.  check box/ blocks and set goal prn Baseline:  Goal status: INITIAL  5.  I with splinting to improve positioning and functional use of UE's prn  Goal status: INITIAL    LONG TERM GOALS: Target date: 07/02/24  I with updated HEP Baseline:  Goal status: INITIAL  2.  Pt will improve quick DASH score to 30% disability or better. Baseline: 35% Goal status: INITIAL  3.  Pt will demonstrate improved RUE fine motor coordination as evidenced by improving 9 hole peg test score by 3 secs.  Goal status: INITIAL  4.  Pt will report that he is dropping items less frequently.  Goal status: INITIAL  5. Pt will improve RUE tip pinch by 2# for increased functional use  Goal status: initial  ASSESSMENT:  CLINICAL IMPRESSION: 54 y.o. right-handed male with insulin -dependent diabetes, orthostatic hypotension, hyperlipidemia, DVT, depression, and asthma. Pt He has severed the right ulnar and median nerve at the upper arm and armpit after falling onto a glass wall, s/p tendon transfers. He has residual weakness and numbness of the hand. Pt was seen today for OT evaluation for new onset tremors and numbness and tingling in UE's.. Pt presents with the performance deficits below. He can benefit from skilled OT to address these deficits in order to maximize safety and I with ADLs/IADLs. PERFORMANCE DEFICITS: in functional skills including ADLs, IADLs, coordination, dexterity, sensation, ROM, strength, pain, flexibility, Fine motor control, Gross motor control, mobility, balance, decreased knowledge of precautions, decreased knowledge of use of DME, and UE functional use,  and  psychosocial skills including coping strategies, environmental adaptation, habits, interpersonal interactions, and routines and behaviors.   IMPAIRMENTS:  are limiting patient from ADLs, IADLs, play, leisure, and social participation.   COMORBIDITIES: may have co-morbidities  that affects occupational performance. Patient will benefit from skilled OT to address above impairments and improve overall function.  MODIFICATION OR ASSISTANCE TO COMPLETE EVALUATION: No modification of tasks or assist necessary to complete an evaluation.  OT OCCUPATIONAL PROFILE AND HISTORY: Detailed assessment: Review of records and additional review of physical, cognitive, psychosocial history related to current functional performance.  CLINICAL DECISION MAKING: LOW - limited treatment options, no task modification necessary  REHAB POTENTIAL: Good  EVALUATION COMPLEXITY: Low      PLAN:  OT FREQUENCY: 1x/week plus eval  OT DURATION: 12 weeks  PLANNED INTERVENTIONS: 97168 OT Re-evaluation, 97535 self care/ADL training, 02889 therapeutic exercise, 97530 therapeutic activity, 97112 neuromuscular re-education, 97140 manual therapy, 97113 aquatic therapy, 97035 ultrasound, 97018 paraffin, 02989 moist heat, 97010 cryotherapy, 97014 electrical stimulation unattended, 97750 Physical Performance Testing, 02239 Orthotic Initial, 97763 Orthotic/Prosthetic subsequent, passive range of motion, balance training, psychosocial skills training, energy conservation, coping strategies training, patient/family education, and DME and/or AE instructions  RECOMMENDED OTHER SERVICES: n/a  CONSULTED AND AGREED WITH PLAN OF CARE: Patient  PLAN FOR NEXT SESSION: stay close with amb pt falls with low BP, check box/ blocks and set goal prn, inital HEP coordination, ring splint for R thumb IP?     Gwenith Tschida, OT 04/09/2024, 1:06 PM   "

## 2024-04-07 NOTE — Therapy (Addendum)
 " OUTPATIENT PHYSICAL THERAPY CERVICAL TREATMENT & DISCHARGE Progress Note Reporting Period 03/26/24 to 04/07/24  See note below for Objective Data and Assessment of Progress/Goals.      Patient Name: Troy [name] MRN: [id number] DOB:1970-05-07, 54 y.o., male Today's Date: 04/07/2024  END OF SESSION:  PT End of Session - 04/07/24 0001     Visit Number 3    Date for Recertification  05/21/24    PT Start Time 0915    PT Stop Time 1000    PT Time Calculation (min) 45 min    Activity Tolerance Patient tolerated treatment well    Behavior During Therapy [facility] for tasks assessed/performed            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   Diabetes insipidus    Diabetes mellitus without complication ([name])    TYPE II   DVT (deep venous thrombosis) ([name])    right leg last time 2020   Dysautonomia ([name]) 07/16/2022   Leg edema, right    Male infertility    Neck injury 12/22/2013   Neuropathy    Obesity    Obesity    OSA (obstructive sleep apnea)    CPAP   Osteoarthritis of spine with radiculopathy, 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   POTS (postural orthostatic tachycardia syndrome) 04/05/2021   Referred otalgia of right ear 07/13/2021   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   Past Surgical History:  Procedure Laterality Date   ANTERIOR CERVICAL DECOMP/DISCECTOMY FUSION N/A 05/25/2020   Procedure: ANTERIOR CERVICAL DECOMPRESSION/DISCECTOMY FUSION, INTERBODY PROSTHESIS, PLATE/SCREWS CERVICAL FIVE- CERVICAL SIX;  Surgeon: Mavis Purchase, MD;  Location: [facility] OR;  Service: Neurosurgery;   Laterality: N/A;   BACK SURGERY     CERVICAL DISC SURGERY     C6/C7 2009   cubital tunnel left arm     2003   ELBOW SURGERY     FIBULA FRACTURE SURGERY     plate & pin removed due to infection 1997   ULNAR NERVE REPAIR     Patient Active Problem List   Diagnosis Date Noted   Caregiver with fatigue 10/03/2022   Left elbow pain 09/05/2022   BMI 40.0-44.9, adult ([name]) 05/01/2022   Obesity, Beginning BMI 40.87 05/01/2022   Chest pain, typical angina 02/28/2022   Left knee pain 01/15/2022   Morbid obesity ([name]) 12/19/2021   Depression 10/31/2021   Syncope 03/23/2021   Elevated hemoglobin 01/16/2021   Orthostatic dizzinesshypotension 11/24/2020   Mandible pain 07/13/2020   Diarrhea 05/16/2020   Cervical radiculitis 04/12/2020   Constipation 03/31/2020   Cervicalgia 02/29/2020   Radiculopathy 01/25/2020   Mixed hyperlipidemia 12/22/2019   Vitamin D  deficiency 09/08/2019   Low HDL (under 40) 09/08/2019   Pancreatic insufficiency 03/28/2018   Type 2 diabetes mellitus with complication, with long-term current use of insulin  ([name]) 09/16/2017   DVT (deep venous thrombosis), right 04/28/2015   Acute deep vein thrombosis (DVT) of femoral vein of right lower extremity ([name]) 04/28/2015   Hemorrhoid 03/23/2013   Varicose vein of  leg right 03/23/2013   Visit for preventive health examination 03/23/2013   Diastasis recti 12/22/2012   Tendinitis of right shoulder 09/27/2011   Right shoulder pain 09/27/2011   Allergic rhinitis 05/23/2011   Disturbance in sleep behavior 02/21/2011   Preventative health care 12/05/2010   Chronic diarrhea of unknown origin pt says not from metformin  12/05/2010   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   Hyperlipidemia 01/23/2008   Obstructive sleep apnea 12/22/2007   VARICOSE VEINS, LOWER EXTREMITIES 10/03/2007   Asthma 09/19/2007   Mild intermittent asthma 09/19/2007   PLANTAR FASCIITIS, LEFT 06/23/2007   RASH AND OTHER NONSPECIFIC SKIN ERUPTION  05/26/2007   EDEMA 05/26/2007   Class 2 severe obesity with serious comorbidity and body mass index (BMI) of 39.0 to 39.9 in adult 02/13/2007   OSA (obstructive sleep apnea) 02/13/2007   DVT, HX OF 01/15/2007   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    PCP: Charlett Howard MD  REFERRING PROVIDER: Tobie Breslow, DO  REFERRING DIAG: worsening tremor B hands and cervical radiculopathy  THERAPY DIAG:  Tremor  Muscle weakness (generalized)  Stiffness of left wrist, not elsewhere classified  Stiffness of right hand, not elsewhere classified  Rationale for Evaluation and Treatment: Rehabilitation  ONSET DATE: essential tremor B hands , L more marked L hand since June of 2025  SUBJECTIVE:                                                                                                                                                                                                         SUBJECTIVE STATEMENT:   Pt reported frequent dropping on R UE and numbness bilaterally when driving or laying down reading for long periods of time. Pt states he is having neck pain 3-4/10 on NPS today. Pt stated he has been doing HEP and the exercises are good.   EVAL:I really feel like my neck pain is not a concern too much, my chief concern is of weakening B hands Hand dominance: Right  PERTINENT HISTORY:  Referred to PT by neurologist, after progressive worsening of B hand tremors, now dropping things frequently, having difficulty holding utensils, etc  PAIN:  Are you having pain? Some neck pain rates lower neck pain at 1-2 not really painful B UE's  PRECAUTIONS: Fall autonomic syndrome and postural hypotension  RED FLAGS: None     WEIGHT BEARING RESTRICTIONS: No  FALLS:  Has patient fallen in last 6 months? Yes. Number of falls 2-3 x a week  LIVING ENVIRONMENT: Lives with: lives with their family Lives in: House/apartment Stairs: has indoor  steps , bedrooms, bathrooms upstairs pt reports  tends to slide on steps on his hips vs walking them  Has following equipment at home: None  OCCUPATION: not working for 1 1/2 yrs  PLOF: Independent with basic ADLs  PATIENT GOALS: get some better control of B hands  NEXT MD VISIT: 04/30/24  OBJECTIVE:  Note: Objective measures were completed at Evaluation unless otherwise noted.  DIAGNOSTIC FINDINGS:  PNCV and EMG ordered   PATIENT SURVEYS:  Quick DASH: 27/100 QuickDASH Score: 22.7 / 100 = 22.7 %  COGNITION: Overall cognitive status: Within functional limits for tasks assessed  SENSATION: Wfl today L UE, long term numbness R UE to previous trauma  POSTURE: rounded shoulders, increased thoracic kyphosis, and flexed trunk   PALPATION: Non tender    CERVICAL ROM:   Active ROM A/PROM (deg) eval  Flexion 80  Extension 50  Right lateral flexion 50  Left lateral flexion 50  Right rotation 65  Left rotation 65   (Blank rows = not tested)  UPPER EXTREMITY MNF:hmnddob wfl UPPER EXTREMITY MMT:  MMT Right eval Left eval Right 04/07/24 Left 04/07/24  Shoulder flexion      Shoulder extension      Shoulder abduction   5 5  Shoulder adduction      Shoulder extension      Shoulder internal rotation      Shoulder external rotation      Middle trapezius      Lower trapezius      Elbow flexion      Elbow extension      Wrist flexion 4     Wrist extension      Wrist ulnar deviation      Wrist radial deviation      Wrist pronation 4  5 5   Wrist supination      Grip strength 24# 28# #25 #27   (Blank rows = wfl)  CERVICAL SPECIAL TESTS:  na  FUNCTIONAL TESTS:  na  TREATMENT DATE:  04/07/24 Windell Bollman see above information Reassessed wrist pronation strength tremor increased with R, none with L see chart above Reassessed grip strength see chart above Pt education about discharge and continued POC with OT  THERAPEUTIC ACTIVITIES: To improve functional performance.  Demonstration, verbal and tactile  cues throughout for technique.  Yellow putty ball squeezes to fatigue L UE Bolt turning with R and L UE to fatigue  3-way elbow flx hand pronated, neutral, and supinated 2x10 each direction #3lb  #2lb wt with supinated hand cued pt to keep wrist in neutral instead of flx postion   ULTT1 median n.glides 10 reps on L UE PT assist   03/31/24 Cervical distraction. PT did not noticed a change in sx with repeated distraction.  ULTT1 median n. glides 10 reps on R and L arm then reassessed tremor with shoulder flex/abd. PT noticed a decreased with L and not as noticeable change on R arm.   Seated pronation with velcro hammer 2x10 Seated rows with blue thera band to fatigue   03/26/24: Evaluation, instructed pt in proximal stability ex with theraband and in bulk strengthening R wrist flexion and pronation, including hammer pronation/supination and hammer wrist flexion  PATIENT EDUCATION:  Education details: POC, goals Person educated: Patient Education method: Explanation, Demonstration, Tactile cues, and Verbal cues Education comprehension: verbalized understanding, returned demonstration, verbal cues required, and tactile cues required  HOME EXERCISE PROGRAM: [id number]  ASSESSMENT:  CLINICAL IMPRESSION:  Garrel attended physical therapy today for bilateral wrist and hand tremors. No reported N/T today. PT performed ULTT1 for median nerve as tx for L UE tremor at pt request. He reported neck pain today 3-4/10 with no other sx reported. PT observed pt struggled with elbow flexion with forearm pronation to keep wrist in neutral. Pt required cueing to keep wrist in a neutral position and not a full flexed wrist position. Pt also complained of feeling fatigued in all UE when performing elbow flexion thera ex. Of note pt was moving through the clinic better since initial eval and  was able to walk to treatment room without any assistance, however he did fall back on chair right before leaving clinic. PT discussed discharge today based on pt functional needs and goals. Pt and PT agreed he would be more appropriate for OT. Pt has showed improved strength since initial visit with PT. However PT was not proficient in addressing fine motor and functional deficits tailored to addressing tremors and pt overall needs.  Pt continued POC would benefit from OT evaluation to address adaptations that he might be able to make to accommodate for his tremor with his fine motor tasks.    Patient is a 54 y.o. male who was evaluated today by  physical therapy due to recent increase in B wrist and hand tremors, numbness tingling B hands and fingers, although today he does not report numbness and tingling, more of a loss of fine motor control and weakness, increased tremor B hands.  He rated his pain today at a 1-2 in his neck.  Evaluation revealed some weakness R wrist flex, pronation also with increased tremor noted with resistance.  He does have postural changes with  more sloped shoulders and decreased cervical spine ROM.  We instructed him in strengthening for his posture and proximal shoulder/ trunk strength as well as R wrist flexion and pronation.  He also may benefit from OT evaluation to address adaptations that he might be able to make to accommodate for his tremor with his fine motor tasks.Of note he has postural hypotension and required CGA to walk through clinic for today;s appt as well as needed to sit an a chair to and from appt at the 25' mark due to weakness. BO was 108/58. OBJECTIVE IMPAIRMENTS: decreased coordination, decreased ROM, decreased strength, impaired perceived functional ability, impaired UE functional use, postural dysfunction, obesity, and pain.   ACTIVITY LIMITATIONS: carrying, lifting, bending, standing, stairs, transfers, dressing, self feeding, reach over head,  hygiene/grooming, and caring for others  PARTICIPATION LIMITATIONS: meal prep, cleaning, laundry, and driving  PERSONAL FACTORS: Behavior pattern, Fitness, and Time since onset of injury/illness/exacerbation are also affecting patient's functional outcome.   REHAB POTENTIAL: Fair    CLINICAL DECISION MAKING: Evolving/moderate complexity  EVALUATION COMPLEXITY: Moderate   GOALS: Goals reviewed with patient? Yes  SHORT TERM GOALS: Target date: 2 weeks, 04/09/24  I HEP  Baseline:  Goal status: MET 04/07/24   LONG TERM GOALS: Target date: 8 weeks, 05/21/24  Improve R pronator strength to 4+ /5 and able to resist this motion without increased tremor activity Baseline:  Goal status: MET  2.  Quick DASH improve from 27/100 to 18/100 Baseline: QuickDASH Score: 22.7 / 100 = 22.7 %  Goal status: Progressing 04/07/24  3.  Reduce numbness / tingling to 0 with additional cervical spine assessment and techniques Baseline: reports no numbness/ tingling today Goal status: MET 04/07/24     PLAN:  PT FREQUENCY: 1x/week  PT DURATION: 8 weeks  PLANNED INTERVENTIONS: 97110-Therapeutic exercises, 97530- Therapeutic activity, 97112- Neuromuscular re-education, 97535- Self Care, [id number]- Manual therapy, and Patient/Family education  PLAN FOR NEXT SESSION: D/C now   Troy [name] SPT 04/07/2024, 1:51 PM      "

## 2024-04-09 ENCOUNTER — Ambulatory Visit: Attending: Neurology | Admitting: Occupational Therapy

## 2024-04-09 ENCOUNTER — Encounter: Payer: Self-pay | Admitting: Internal Medicine

## 2024-04-09 DIAGNOSIS — M25641 Stiffness of right hand, not elsewhere classified: Secondary | ICD-10-CM | POA: Diagnosis present

## 2024-04-09 DIAGNOSIS — M6281 Muscle weakness (generalized): Secondary | ICD-10-CM | POA: Diagnosis present

## 2024-04-09 DIAGNOSIS — R202 Paresthesia of skin: Secondary | ICD-10-CM | POA: Diagnosis not present

## 2024-04-09 DIAGNOSIS — R251 Tremor, unspecified: Secondary | ICD-10-CM | POA: Diagnosis present

## 2024-04-09 DIAGNOSIS — R200 Anesthesia of skin: Secondary | ICD-10-CM | POA: Diagnosis not present

## 2024-04-09 DIAGNOSIS — R278 Other lack of coordination: Secondary | ICD-10-CM | POA: Insufficient documentation

## 2024-04-09 DIAGNOSIS — M542 Cervicalgia: Secondary | ICD-10-CM | POA: Insufficient documentation

## 2024-04-10 MED ORDER — EMPAGLIFLOZIN 25 MG PO TABS: 25.0000 mg | ORAL_TABLET | Freq: Every day | ORAL | 3 refills | Status: AC

## 2024-04-10 MED ORDER — OMNIPOD 5 G7 PODS (GEN 5) MISC: 1.0000 | 3 refills | Status: AC

## 2024-04-14 ENCOUNTER — Ambulatory Visit

## 2024-04-14 ENCOUNTER — Other Ambulatory Visit (INDEPENDENT_AMBULATORY_CARE_PROVIDER_SITE_OTHER): Payer: Self-pay | Admitting: Physician Assistant

## 2024-04-14 DIAGNOSIS — F3289 Other specified depressive episodes: Secondary | ICD-10-CM

## 2024-04-14 MED ORDER — ESCITALOPRAM OXALATE 20 MG PO TABS: 20.0000 mg | ORAL_TABLET | Freq: Every day | ORAL | 1 refills | Status: AC

## 2024-04-21 ENCOUNTER — Ambulatory Visit: Admitting: Occupational Therapy

## 2024-04-28 ENCOUNTER — Ambulatory Visit: Admitting: Occupational Therapy

## 2024-04-30 ENCOUNTER — Ambulatory Visit (INDEPENDENT_AMBULATORY_CARE_PROVIDER_SITE_OTHER): Admitting: Family Medicine

## 2024-05-05 ENCOUNTER — Ambulatory Visit: Admitting: Occupational Therapy

## 2024-05-13 ENCOUNTER — Ambulatory Visit (HOSPITAL_COMMUNITY): Payer: Self-pay | Admitting: Family

## 2024-05-21 ENCOUNTER — Encounter: Payer: Self-pay | Admitting: Neurology

## 2024-06-18 ENCOUNTER — Ambulatory Visit: Admitting: Internal Medicine

## 2024-07-29 ENCOUNTER — Ambulatory Visit: Payer: Self-pay | Admitting: Neurology

## 2025-02-11 ENCOUNTER — Encounter: Admitting: Internal Medicine
